# Patient Record
Sex: Male | Born: 1960
Health system: Southern US, Community
[De-identification: ages and names within clinical notes are randomized; demographics above are authoritative.]

## PROBLEM LIST (undated history)

## (undated) DIAGNOSIS — E785 Hyperlipidemia, unspecified: Secondary | ICD-10-CM

## (undated) DIAGNOSIS — F1010 Alcohol abuse, uncomplicated: Secondary | ICD-10-CM

## (undated) DIAGNOSIS — I714 Abdominal aortic aneurysm, without rupture, unspecified: Secondary | ICD-10-CM

## (undated) DIAGNOSIS — K219 Gastro-esophageal reflux disease without esophagitis: Secondary | ICD-10-CM

## (undated) DIAGNOSIS — I5022 Chronic systolic (congestive) heart failure: Secondary | ICD-10-CM

## (undated) DIAGNOSIS — J439 Emphysema, unspecified: Secondary | ICD-10-CM

## (undated) DIAGNOSIS — Z720 Tobacco use: Secondary | ICD-10-CM

## (undated) DIAGNOSIS — K859 Acute pancreatitis without necrosis or infection, unspecified: Secondary | ICD-10-CM

## (undated) DIAGNOSIS — I480 Paroxysmal atrial fibrillation: Secondary | ICD-10-CM

## (undated) DIAGNOSIS — K5790 Diverticulosis of intestine, part unspecified, without perforation or abscess without bleeding: Secondary | ICD-10-CM

## (undated) DIAGNOSIS — I10 Essential (primary) hypertension: Secondary | ICD-10-CM

## (undated) HISTORY — DX: Essential (primary) hypertension: I10

## (undated) HISTORY — DX: Paroxysmal atrial fibrillation: I48.0

## (undated) HISTORY — DX: Gastro-esophageal reflux disease without esophagitis: K21.9

## (undated) HISTORY — DX: Acute pancreatitis without necrosis or infection, unspecified: K85.90

## (undated) HISTORY — DX: Alcohol abuse, uncomplicated: F10.10

## (undated) HISTORY — DX: Hypomagnesemia: E83.42

## (undated) HISTORY — DX: Emphysema, unspecified: J43.9

## (undated) HISTORY — DX: Tobacco use: Z72.0

## (undated) HISTORY — DX: Diverticulosis of intestine, part unspecified, without perforation or abscess without bleeding: K57.90

## (undated) HISTORY — DX: Chronic systolic (congestive) heart failure: I50.22

## (undated) HISTORY — DX: Abdominal aortic aneurysm, without rupture, unspecified: I71.40

## (undated) HISTORY — PX: OTHER SURGICAL HISTORY: SHX169

## (undated) HISTORY — DX: Hyperlipidemia, unspecified: E78.5

## (undated) HISTORY — PX: HERNIA REPAIR: SHX51

---

## 2014-11-11 ENCOUNTER — Inpatient Hospital Stay (HOSPITAL_COMMUNITY): Payer: PRIVATE HEALTH INSURANCE

## 2014-11-11 ENCOUNTER — Encounter (HOSPITAL_COMMUNITY)
Admission: EM | Disposition: A | Discharge status: Skilled Nursing Facility | Payer: Self-pay | Source: Home / Self Care | Attending: Internal Medicine

## 2014-11-11 ENCOUNTER — Emergency Department (HOSPITAL_COMMUNITY): Payer: PRIVATE HEALTH INSURANCE

## 2014-11-11 ENCOUNTER — Inpatient Hospital Stay (HOSPITAL_COMMUNITY): Payer: PRIVATE HEALTH INSURANCE | Admitting: Anesthesiology

## 2014-11-11 ENCOUNTER — Inpatient Hospital Stay (HOSPITAL_COMMUNITY)
Admission: EM | Admit: 2014-11-11 | Discharge: 2014-11-14 | DRG: 481 | Disposition: A | Discharge status: Skilled Nursing Facility | Payer: PRIVATE HEALTH INSURANCE | Attending: Internal Medicine | Admitting: Internal Medicine

## 2014-11-11 ENCOUNTER — Encounter (HOSPITAL_COMMUNITY): Payer: Self-pay | Admitting: *Deleted

## 2014-11-11 DIAGNOSIS — F10931 Alcohol use, unspecified with withdrawal delirium: Secondary | ICD-10-CM | POA: Diagnosis present

## 2014-11-11 DIAGNOSIS — D62 Acute posthemorrhagic anemia: Secondary | ICD-10-CM | POA: Diagnosis not present

## 2014-11-11 DIAGNOSIS — M62838 Other muscle spasm: Secondary | ICD-10-CM | POA: Diagnosis not present

## 2014-11-11 DIAGNOSIS — M25551 Pain in right hip: Secondary | ICD-10-CM | POA: Diagnosis present

## 2014-11-11 DIAGNOSIS — S72141A Displaced intertrochanteric fracture of right femur, initial encounter for closed fracture: Secondary | ICD-10-CM

## 2014-11-11 DIAGNOSIS — D649 Anemia, unspecified: Secondary | ICD-10-CM

## 2014-11-11 DIAGNOSIS — D696 Thrombocytopenia, unspecified: Secondary | ICD-10-CM | POA: Diagnosis present

## 2014-11-11 DIAGNOSIS — W010XXA Fall on same level from slipping, tripping and stumbling without subsequent striking against object, initial encounter: Secondary | ICD-10-CM | POA: Diagnosis present

## 2014-11-11 DIAGNOSIS — S72001A Fracture of unspecified part of neck of right femur, initial encounter for closed fracture: Secondary | ICD-10-CM

## 2014-11-11 DIAGNOSIS — Y9289 Other specified places as the place of occurrence of the external cause: Secondary | ICD-10-CM

## 2014-11-11 DIAGNOSIS — Z79899 Other long term (current) drug therapy: Secondary | ICD-10-CM

## 2014-11-11 DIAGNOSIS — F10231 Alcohol dependence with withdrawal delirium: Secondary | ICD-10-CM | POA: Diagnosis present

## 2014-11-11 DIAGNOSIS — D638 Anemia in other chronic diseases classified elsewhere: Secondary | ICD-10-CM | POA: Diagnosis present

## 2014-11-11 DIAGNOSIS — F17210 Nicotine dependence, cigarettes, uncomplicated: Secondary | ICD-10-CM | POA: Diagnosis present

## 2014-11-11 DIAGNOSIS — W19XXXA Unspecified fall, initial encounter: Secondary | ICD-10-CM

## 2014-11-11 HISTORY — PX: INTRAMEDULLARY (IM) NAIL INTERTROCHANTERIC: SHX5875

## 2014-11-11 HISTORY — DX: Displaced intertrochanteric fracture of right femur, initial encounter for closed fracture: S72.141A

## 2014-11-11 HISTORY — DX: Alcohol use, unspecified with withdrawal delirium: F10.931

## 2014-11-11 LAB — CBC WITH DIFFERENTIAL/PLATELET
Basophils Absolute: 0 10*3/uL (ref 0.0–0.1)
Basophils Relative: 0 % (ref 0–1)
EOS ABS: 0.1 10*3/uL (ref 0.0–0.7)
Eosinophils Relative: 2 % (ref 0–5)
HCT: 36.9 % — ABNORMAL LOW (ref 39.0–52.0)
Hemoglobin: 12.2 g/dL — ABNORMAL LOW (ref 13.0–17.0)
Lymphocytes Relative: 13 % (ref 12–46)
Lymphs Abs: 1.2 10*3/uL (ref 0.7–4.0)
MCH: 31.9 pg (ref 26.0–34.0)
MCHC: 33.1 g/dL (ref 30.0–36.0)
MCV: 96.3 fL (ref 78.0–100.0)
MONOS PCT: 4 % (ref 3–12)
Monocytes Absolute: 0.4 10*3/uL (ref 0.1–1.0)
NEUTROS PCT: 81 % — AB (ref 43–77)
Neutro Abs: 7.5 10*3/uL (ref 1.7–7.7)
PLATELETS: 198 10*3/uL (ref 150–400)
RBC: 3.83 MIL/uL — AB (ref 4.22–5.81)
RDW: 13.2 % (ref 11.5–15.5)
WBC: 9.2 10*3/uL (ref 4.0–10.5)

## 2014-11-11 LAB — PROTIME-INR
INR: 1 (ref 0.00–1.49)
Prothrombin Time: 13.3 seconds (ref 11.6–15.2)

## 2014-11-11 LAB — ABO/RH: ABO/RH(D): O POS

## 2014-11-11 LAB — BASIC METABOLIC PANEL
Anion gap: 5 (ref 5–15)
BUN: 19 mg/dL (ref 6–23)
CHLORIDE: 108 mmol/L (ref 96–112)
CO2: 29 mmol/L (ref 19–32)
Calcium: 8.8 mg/dL (ref 8.4–10.5)
Creatinine, Ser: 0.8 mg/dL (ref 0.50–1.35)
GFR calc non Af Amer: >90 mL/min (ref >90)
Glucose, Bld: 97 mg/dL (ref 70–99)
POTASSIUM: 3.7 mmol/L (ref 3.5–5.1)
SODIUM: 142 mmol/L (ref 135–145)

## 2014-11-11 LAB — TYPE AND SCREEN
ABO/RH(D): O POS
Antibody Screen: NEGATIVE

## 2014-11-11 SURGERY — FIXATION, FRACTURE, INTERTROCHANTERIC, WITH INTRAMEDULLARY ROD
Anesthesia: General | Site: Hip | Laterality: Right

## 2014-11-11 MED ORDER — METOCLOPRAMIDE HCL 10 MG PO TABS
5.0000 mg | ORAL_TABLET | Freq: Three times a day (TID) | ORAL | Status: DC | PRN
Start: 2014-11-11 — End: 2014-11-14

## 2014-11-11 MED ORDER — ACETAMINOPHEN 650 MG RE SUPP: 650.0000 mg | Freq: Four times a day (QID) | RECTAL | Status: DC | PRN

## 2014-11-11 MED ORDER — ONDANSETRON HCL 4 MG/2ML IJ SOLN
4.0000 mg | Freq: Once | INTRAMUSCULAR | Status: AC
  Administered 2014-11-11: 4 mg via INTRAVENOUS
  Filled 2014-11-11: qty 2

## 2014-11-11 MED ORDER — DOCUSATE SODIUM 100 MG PO CAPS
100.0000 mg | ORAL_CAPSULE | Freq: Two times a day (BID) | ORAL | Status: DC
  Administered 2014-11-12 – 2014-11-14 (×5): 100 mg via ORAL

## 2014-11-11 MED ORDER — SUCCINYLCHOLINE CHLORIDE 20 MG/ML IJ SOLN
INTRAMUSCULAR | Status: DC | PRN
  Administered 2014-11-11: 100 mg via INTRAVENOUS

## 2014-11-11 MED ORDER — PROPOFOL 10 MG/ML IV BOLUS
INTRAVENOUS | Status: DC | PRN
  Administered 2014-11-11: 130 mg via INTRAVENOUS

## 2014-11-11 MED ORDER — LIDOCAINE HCL (CARDIAC) 20 MG/ML IV SOLN
INTRAVENOUS | Status: DC | PRN
Start: 2014-11-11 — End: 2014-11-11
  Administered 2014-11-11: 50 mg via INTRAVENOUS

## 2014-11-11 MED ORDER — ONDANSETRON HCL 4 MG/2ML IJ SOLN
INTRAMUSCULAR | Status: AC
  Filled 2014-11-11: qty 2

## 2014-11-11 MED ORDER — CEFAZOLIN SODIUM-DEXTROSE 2-3 GM-% IV SOLR
INTRAVENOUS | Status: DC | PRN
  Administered 2014-11-11: 2 g via INTRAVENOUS

## 2014-11-11 MED ORDER — MAGNESIUM CITRATE PO SOLN: 1.0000 | Freq: Once | ORAL | Status: AC | PRN

## 2014-11-11 MED ORDER — HYDROCODONE-ACETAMINOPHEN 5-325 MG PO TABS
1.0000 | ORAL_TABLET | Freq: Four times a day (QID) | ORAL | Status: DC | PRN
  Administered 2014-11-12: 2 via ORAL
  Filled 2014-11-11: qty 2

## 2014-11-11 MED ORDER — PROPOFOL 10 MG/ML IV BOLUS
INTRAVENOUS | Status: AC
  Filled 2014-11-11: qty 20

## 2014-11-11 MED ORDER — ACETAMINOPHEN 325 MG PO TABS: 650.0000 mg | ORAL_TABLET | Freq: Four times a day (QID) | ORAL | Status: DC | PRN

## 2014-11-11 MED ORDER — FENTANYL CITRATE (PF) 250 MCG/5ML IJ SOLN
INTRAMUSCULAR | Status: AC
  Filled 2014-11-11: qty 5

## 2014-11-11 MED ORDER — DEXAMETHASONE SODIUM PHOSPHATE 10 MG/ML IJ SOLN
INTRAMUSCULAR | Status: AC
  Filled 2014-11-11: qty 1

## 2014-11-11 MED ORDER — SODIUM CHLORIDE 0.9 % IV SOLN
INTRAVENOUS | Status: DC
  Administered 2014-11-12: 01:00:00 via INTRAVENOUS
  Filled 2014-11-11 (×8): qty 1000

## 2014-11-11 MED ORDER — METHOCARBAMOL 1000 MG/10ML IJ SOLN: 500.0000 mg | Freq: Four times a day (QID) | INTRAVENOUS | Status: DC | PRN

## 2014-11-11 MED ORDER — METHOCARBAMOL 500 MG PO TABS: 500.0000 mg | ORAL_TABLET | Freq: Four times a day (QID) | ORAL | Status: DC | PRN

## 2014-11-11 MED ORDER — ALUM & MAG HYDROXIDE-SIMETH 200-200-20 MG/5ML PO SUSP: 30.0000 mL | ORAL | Status: DC | PRN

## 2014-11-11 MED ORDER — ENOXAPARIN SODIUM 40 MG/0.4ML ~~LOC~~ SOLN
40.0000 mg | SUBCUTANEOUS | Status: DC
Start: 2014-11-12 — End: 2014-11-14
  Administered 2014-11-12 – 2014-11-14 (×3): 40 mg via SUBCUTANEOUS
  Filled 2014-11-11 (×3): qty 0.4

## 2014-11-11 MED ORDER — METOCLOPRAMIDE HCL 5 MG/ML IJ SOLN: 5.0000 mg | Freq: Three times a day (TID) | INTRAMUSCULAR | Status: DC | PRN

## 2014-11-11 MED ORDER — METHOCARBAMOL 1000 MG/10ML IJ SOLN
500.0000 mg | Freq: Four times a day (QID) | INTRAVENOUS | Status: DC | PRN
  Administered 2014-11-11: 500 mg via INTRAVENOUS
  Filled 2014-11-11 (×2): qty 5

## 2014-11-11 MED ORDER — CEFAZOLIN SODIUM 1-5 GM-% IV SOLN
1.0000 g | Freq: Four times a day (QID) | INTRAVENOUS | Status: AC
  Administered 2014-11-12 (×2): 1 g via INTRAVENOUS
  Filled 2014-11-11 (×2): qty 50

## 2014-11-11 MED ORDER — POLYETHYLENE GLYCOL 3350 17 G PO PACK: 17.0000 g | PACK | Freq: Every day | ORAL | Status: DC | PRN

## 2014-11-11 MED ORDER — FERROUS SULFATE 325 (65 FE) MG PO TABS
325.0000 mg | ORAL_TABLET | Freq: Three times a day (TID) | ORAL | Status: DC
  Administered 2014-11-12 – 2014-11-14 (×7): 325 mg via ORAL
  Filled 2014-11-11 (×10): qty 1

## 2014-11-11 MED ORDER — FENTANYL CITRATE (PF) 100 MCG/2ML IJ SOLN
INTRAMUSCULAR | Status: DC | PRN
  Administered 2014-11-11: 50 ug via INTRAVENOUS
  Administered 2014-11-11: 100 ug via INTRAVENOUS
  Administered 2014-11-11 (×2): 50 ug via INTRAVENOUS

## 2014-11-11 MED ORDER — MORPHINE SULFATE 2 MG/ML IJ SOLN
0.5000 mg | INTRAMUSCULAR | Status: DC | PRN
  Administered 2014-11-12: 0.5 mg via INTRAVENOUS
  Filled 2014-11-11: qty 1

## 2014-11-11 MED ORDER — HYDROMORPHONE HCL 1 MG/ML IJ SOLN
0.2500 mg | INTRAMUSCULAR | Status: DC | PRN
  Administered 2014-11-11 (×3): 0.5 mg via INTRAVENOUS

## 2014-11-11 MED ORDER — HYDROMORPHONE HCL 1 MG/ML IJ SOLN
INTRAMUSCULAR | Status: AC
Start: 2014-11-11 — End: 2014-11-11
  Administered 2014-11-11: 23:00:00
  Filled 2014-11-11: qty 2

## 2014-11-11 MED ORDER — MENTHOL 3 MG MT LOZG: 1.0000 | LOZENGE | OROMUCOSAL | Status: DC | PRN

## 2014-11-11 MED ORDER — PHENOL 1.4 % MT LIQD
1.0000 | OROMUCOSAL | Status: DC | PRN
  Filled 2014-11-11: qty 177

## 2014-11-11 MED ORDER — HYDROMORPHONE HCL 1 MG/ML IJ SOLN
0.5000 mg | INTRAMUSCULAR | Status: AC | PRN
  Administered 2014-11-11 (×2): 0.5 mg via INTRAVENOUS
  Filled 2014-11-11 (×2): qty 1

## 2014-11-11 MED ORDER — LACTATED RINGERS IV SOLN
INTRAVENOUS | Status: DC | PRN
  Administered 2014-11-11: 21:00:00 via INTRAVENOUS

## 2014-11-11 MED ORDER — MIDAZOLAM HCL 5 MG/5ML IJ SOLN
INTRAMUSCULAR | Status: DC | PRN
  Administered 2014-11-11: 2 mg via INTRAVENOUS

## 2014-11-11 MED ORDER — ONDANSETRON HCL 4 MG PO TABS: 4.0000 mg | ORAL_TABLET | Freq: Four times a day (QID) | ORAL | Status: DC | PRN

## 2014-11-11 MED ORDER — EPHEDRINE SULFATE 50 MG/ML IJ SOLN
INTRAMUSCULAR | Status: DC | PRN
  Administered 2014-11-11: 10 mg via INTRAVENOUS

## 2014-11-11 MED ORDER — CEFAZOLIN SODIUM-DEXTROSE 2-3 GM-% IV SOLR
INTRAVENOUS | Status: AC
  Filled 2014-11-11: qty 50

## 2014-11-11 MED ORDER — POLYETHYLENE GLYCOL 3350 17 G PO PACK
17.0000 g | PACK | Freq: Two times a day (BID) | ORAL | Status: DC
  Administered 2014-11-12 – 2014-11-14 (×5): 17 g via ORAL

## 2014-11-11 MED ORDER — ONDANSETRON HCL 4 MG/2ML IJ SOLN
INTRAMUSCULAR | Status: DC | PRN
  Administered 2014-11-11: 4 mg via INTRAVENOUS

## 2014-11-11 MED ORDER — DEXAMETHASONE SODIUM PHOSPHATE 10 MG/ML IJ SOLN
INTRAMUSCULAR | Status: DC | PRN
  Administered 2014-11-11: 10 mg via INTRAVENOUS

## 2014-11-11 MED ORDER — LIDOCAINE HCL (CARDIAC) 20 MG/ML IV SOLN
INTRAVENOUS | Status: AC
  Filled 2014-11-11: qty 5

## 2014-11-11 MED ORDER — LACTATED RINGERS IV SOLN: INTRAVENOUS | Status: DC

## 2014-11-11 MED ORDER — HYDROMORPHONE HCL 1 MG/ML IJ SOLN
0.5000 mg | INTRAMUSCULAR | Status: DC | PRN
  Administered 2014-11-12: 1 mg via INTRAVENOUS
  Filled 2014-11-11: qty 1

## 2014-11-11 MED ORDER — HYDROCODONE-ACETAMINOPHEN 5-325 MG PO TABS: 1.0000 | ORAL_TABLET | Freq: Four times a day (QID) | ORAL | Status: DC | PRN

## 2014-11-11 MED ORDER — ONDANSETRON HCL 4 MG/2ML IJ SOLN: 4.0000 mg | Freq: Four times a day (QID) | INTRAMUSCULAR | Status: DC | PRN

## 2014-11-11 MED ORDER — METHOCARBAMOL 500 MG PO TABS
500.0000 mg | ORAL_TABLET | Freq: Four times a day (QID) | ORAL | Status: DC | PRN
  Administered 2014-11-12 – 2014-11-14 (×8): 500 mg via ORAL
  Filled 2014-11-11 (×8): qty 1

## 2014-11-11 MED ORDER — SODIUM CHLORIDE 0.9 % IV SOLN
INTRAVENOUS | Status: DC
  Administered 2014-11-11: via INTRAVENOUS

## 2014-11-11 MED ORDER — MIDAZOLAM HCL 2 MG/2ML IJ SOLN
INTRAMUSCULAR | Status: AC
  Filled 2014-11-11: qty 2

## 2014-11-11 SURGICAL SUPPLY — 42 items
BAG ZIPLOCK 12X15 (MISCELLANEOUS) IMPLANT
BIT DRILL 4.3MMS DISTAL GRDTED (BIT) ×1 IMPLANT
BNDG GAUZE ELAST 4 BULKY (GAUZE/BANDAGES/DRESSINGS) ×3 IMPLANT
CORTICAL BONE SCR 5.0MM X 46MM (Screw) ×3 IMPLANT
COVER PERINEAL POST (MISCELLANEOUS) ×3 IMPLANT
DRAPE INCISE IOBAN 66X45 STRL (DRAPES) ×3 IMPLANT
DRAPE STERI IOBAN 125X83 (DRAPES) ×3 IMPLANT
DRILL 4.3MMS DISTAL GRADUATED (BIT) ×3
DRSG AQUACEL AG ADV 3.5X 4 (GAUZE/BANDAGES/DRESSINGS) IMPLANT
DRSG AQUACEL AG ADV 3.5X 6 (GAUZE/BANDAGES/DRESSINGS) IMPLANT
DRSG MEPILEX BORDER 4X12 (GAUZE/BANDAGES/DRESSINGS) ×3 IMPLANT
DRSG MEPILEX BORDER 4X4 (GAUZE/BANDAGES/DRESSINGS) ×3 IMPLANT
DURAPREP 26ML APPLICATOR (WOUND CARE) ×3 IMPLANT
ELECT REM PT RETURN 9FT ADLT (ELECTROSURGICAL) ×3
ELECTRODE REM PT RTRN 9FT ADLT (ELECTROSURGICAL) ×1 IMPLANT
GAUZE SPONGE 4X4 12PLY STRL (GAUZE/BANDAGES/DRESSINGS) ×3 IMPLANT
GLOVE BIOGEL PI IND STRL 7.0 (GLOVE) ×1 IMPLANT
GLOVE BIOGEL PI IND STRL 7.5 (GLOVE) ×1 IMPLANT
GLOVE BIOGEL PI IND STRL 8 (GLOVE) ×1 IMPLANT
GLOVE BIOGEL PI INDICATOR 7.0 (GLOVE) ×2
GLOVE BIOGEL PI INDICATOR 7.5 (GLOVE) ×2
GLOVE BIOGEL PI INDICATOR 8 (GLOVE) ×2
GLOVE ORTHO TXT STRL SZ7.5 (GLOVE) ×3 IMPLANT
GLOVE SURG SS PI 7.0 STRL IVOR (GLOVE) ×3 IMPLANT
GOWN SPEC L3 XXLG W/TWL (GOWN DISPOSABLE) ×3 IMPLANT
GOWN STRL REUS W/TWL LRG LVL3 (GOWN DISPOSABLE) ×3 IMPLANT
GUIDEPIN 3.2X17.5 THRD DISP (PIN) ×3 IMPLANT
GUIDEWIRE BALL NOSE 100CM (WIRE) ×3 IMPLANT
HFN RH 130 DEG 11MM X 420MM (Nail) ×3 IMPLANT
KIT BASIN OR (CUSTOM PROCEDURE TRAY) ×3 IMPLANT
MANIFOLD NEPTUNE II (INSTRUMENTS) ×3 IMPLANT
PACK GENERAL/GYN (CUSTOM PROCEDURE TRAY) ×3 IMPLANT
POSITIONER SURGICAL ARM (MISCELLANEOUS) ×3 IMPLANT
SCREW BONE CORTICAL 5.0X50 (Screw) ×3 IMPLANT
SCREW CORTICL BON 5.0MM X 46MM (Screw) ×1 IMPLANT
SCREW LAG 10.5X120MM (Screw) ×3 IMPLANT
STAPLER VISISTAT 35W (STAPLE) ×3 IMPLANT
SUT MNCRL AB 4-0 PS2 18 (SUTURE) ×3 IMPLANT
SUT VIC AB 1 CT1 27 (SUTURE) ×2
SUT VIC AB 1 CT1 27XBRD ANTBC (SUTURE) ×1 IMPLANT
SUT VIC AB 2-0 CT1 27 (SUTURE) ×2
SUT VIC AB 2-0 CT1 27XBRD (SUTURE) ×1 IMPLANT

## 2014-11-11 NOTE — H&P (Signed)
[facility] History and Physical  Visente Bonde XID:[id number] DOB: 11-11-1960 DOA: 11/11/2014  Referring physician: Marlon Pel, PA PCP: Pcp Not In System   Chief Complaint: Fall   HPI: Luis Marquez is a 54 y.o. male presents after a fall with right hip pain. 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 He states that he has to step in a stepping pan to clean his feet and he states his feet got caught up in it. He states he tripped and fell. Patient states that he tried to get up and could not bear weight. He states that he fell again and heard a popping sound. He states that he was a former drinker but has not had a drink in 2 months. Patient denies syncope no chest pain. He states no SOB noted. Has not had fevers or chills. He states now his pain is a little better. He does not live here in [place] and is from [place]   Review of Systems:  Complete 12 point ROS done and is unremarkable other noted in HPI  History reviewed. No pertinent past medical history. Past Surgical History  Procedure Laterality Date  . Hernia repair     Social History:  reports that he has been smoking Cigarettes.  He has been smoking about 0.25 packs per day. He does not have any smokeless tobacco history on file. He reports that he does not drink alcohol or use illicit drugs.  No Known Allergies  No family history on file.   Prior to Admission medications   Medication Sig Start Date End Date Taking? Authorizing Provider  GLUCOS-CHONDROIT-MSM-C-HYAL PO Take 1 tablet by mouth daily.   Yes Historical Provider, MD  IRON PO Take 1 tablet by mouth daily.   Yes Historical Provider, MD  Multiple Vitamin (MULTIVITAMIN WITH MINERALS) TABS tablet Take 1 tablet by mouth daily.   Yes Historical Provider, MD   Physical Exam: Filed Vitals:   11/11/14 1637  BP: 138/91  Pulse: 64  Temp: 97.6 F (36.4 C)  TempSrc: Oral  Resp: 18  SpO2: 93%    Wt Readings from Last 3 Encounters:  No data found for Wt      General:  Appears calm and comfortable Eyes: PERRL, normal lids, irises & conjunctiva ENT: grossly normal hearing, lips & tongue Neck: no LAD, masses or thyromegaly Cardiovascular: RRR, no m/r/g. No LE edema. Respiratory: CTA bilaterally, no w/r/r. Normal respiratory effort. Abdomen: soft, ntnd Skin: no rash or induration seen on limited exam Musculoskeletal: grossly normal tone BUE/BLE c/o right hip pain Psychiatric: grossly normal mood and affect, speech fluent and appropriate Neurologic: grossly non-focal.          Labs on Admission:  Basic Metabolic Panel:  Recent Labs Lab 11/11/14 1717  NA 142  K 3.7  CL 108  CO2 29  GLUCOSE 97  BUN 19  CREATININE 0.80  CALCIUM 8.8   Liver Function Tests: No results for input(s): AST, ALT, ALKPHOS, BILITOT, PROT, ALBUMIN in the last 168 hours. No results for input(s): LIPASE, AMYLASE in the last 168 hours. No results for input(s): AMMONIA in the last 168 hours. CBC:  Recent Labs Lab 11/11/14 1717  WBC 9.2  NEUTROABS 7.5  HGB 12.2*  HCT 36.9*  MCV 96.3  PLT 198   Cardiac Enzymes: No results for input(s): CKTOTAL, CKMB, CKMBINDEX, TROPONINI in the last 168 hours.  BNP (last 3 results) No results for input(s): BNP in the last 8760 hours.  ProBNP (last 3 results)  No results for input(s): PROBNP in the last 8760 hours.  CBG: No results for input(s): GLUCAP in the last 168 hours.  Radiological Exams on Admission: Dg Chest 1 View  11/11/2014   CLINICAL DATA:  Preop right hip fracture.  EXAM: CHEST  1 VIEW  COMPARISON:  None.  FINDINGS: The heart size and mediastinal contours are within normal limits. Both lungs are clear. The visualized skeletal structures are unremarkable.  IMPRESSION: No active disease.   Electronically Signed   By: Elige Ko   On: 11/11/2014 18:07   Dg Hip Unilat With Pelvis 2-3 Views Right  11/11/2014   CLINICAL DATA:  Tripped on concrete.  Right hip pain.  EXAM: RIGHT HIP (WITH PELVIS) 2-3  VIEWS  COMPARISON:  None.  FINDINGS: Comminuted and angulated right intertrochanteric fracture. No hip dislocation.  Left hip is unremarkable.  Prior left inguinal hernia repair.  IMPRESSION: Comminuted and angulated right intertrochanteric fracture.   Electronically Signed   By: Elige Ko   On: 11/11/2014 17:55     Assessment/Plan Active Problems:   Delirium tremens   Intertrochanteric fracture of right femur   Fracture, intertrochanteric, right femur   Anemia   1. Intertrochanteric Fracture of the Right Femur -will admit for orthopedics -orthopedics consult ordered -will keep NPO -Pain control -non-weight bearing  2. Normocytic Anemia -will check iron studies -type and cross in ED  3. Remote history of ETOH abuse -he has not had a drink in over 2 months   Code Status: Full Code (must indicate code status--if unknown or must be presumed, indicate so) DVT Prophylaxis:SCD Family Communication: None (indicate person spoken with, if applicable, with phone number if by telephone) Disposition Plan: Possible SNF (indicate anticipated LOS)  Time spent:  [facility] A [facility] Pager [phone]

## 2014-11-11 NOTE — Anesthesia Procedure Notes (Signed)
Procedure Name: Intubation Date/Time: 11/11/2014 9:26 PM Performed by: Early Osmond E Pre-anesthesia Checklist: Patient identified, Emergency Drugs available, Suction available and Patient being monitored Patient Re-evaluated:Patient Re-evaluated prior to inductionOxygen Delivery Method: Circle System Utilized Preoxygenation: Pre-oxygenation with 100% oxygen Intubation Type: IV induction, Rapid sequence and Cricoid Pressure applied Laryngoscope Size: Mac and 3 Grade View: Grade II Tube type: Oral Tube size: 7.5 mm Number of attempts: 1 Airway Equipment and Method: Stylet Placement Confirmation: ETT inserted through vocal cords under direct vision,  positive ETCO2 and breath sounds checked- equal and bilateral Secured at: 21 cm Tube secured with: Tape Dental Injury: Teeth and Oropharynx as per pre-operative assessment

## 2014-11-11 NOTE — Brief Op Note (Signed)
11/11/2014  8:59 PM  PATIENT:  Luis Marquez  54 y.o. male  PRE-OPERATIVE DIAGNOSIS:  Right hip intertrochanteric proximal femur fracture  POST-OPERATIVE DIAGNOSIS:  Right hip intertrochanteric proximal femur fracture  PROCEDURE:  Procedure(s): INTRAMEDULLARY (IM) NAIL INTERTROCHANTRIC (Right), ORIF of right hip fracture  SURGEON:  Surgeon(s) and Role:    * Durene Romans, MD - Primary  PHYSICIAN ASSISTANT: None  ANESTHESIA:   general  EBL:   <100cc  BLOOD ADMINISTERED:none  DRAINS: none   LOCAL MEDICATIONS USED:  NONE  SPECIMEN:  No Specimen  DISPOSITION OF SPECIMEN:  N/A  COUNTS:  YES  TOURNIQUET:  * No tourniquets in log *  DICTATION: .Other Dictation: Dictation Number [id number]  PLAN OF CARE: Admit to inpatient   PATIENT DISPOSITION:  PACU - hemodynamically stable.   Delay start of Pharmacological VTE agent (>24hrs) due to surgical blood loss or risk of bleeding: no

## 2014-11-11 NOTE — Progress Notes (Signed)
EDCM spoke to patient at bedside.  Patient reports he is from [place].  Patient reports his pcp is Dr. Pollie Friar in [place].  Patient reports he is currently staying at [facility] house for recovery.  Patient reports he has been staying at [facility] for two months.  While working at [employer] patient fell with injury to right leg.  Xray right hip revealed comminuted and angulated right intertrochanteric fracture.  Patient reports no family here in [place].  Patient is without insurance.  Patient reports before fall, independent with ADL's.  [facility] consulted EDSW.  No further EDCM needs at this time.

## 2014-11-11 NOTE — Transfer of Care (Signed)
Immediate Anesthesia Transfer of Care Note  Patient: Luis Marquez  Procedure(s) Performed: Procedure(s): INTRAMEDULLARY (IM) NAIL INTERTROCHANTRIC (Right)  Patient Location: PACU  Anesthesia Type:General  Level of Consciousness:  sedated, patient cooperative and responds to stimulation  Airway & Oxygen Therapy:Patient Spontanous Breathing and Patient connected to face mask oxgen  Post-op Assessment:  Report given to PACU RN and Post -op Vital signs reviewed and stable  Post vital signs:  Reviewed and stable  Last Vitals:  Filed Vitals:   11/11/14 1924  BP: 145/92  Pulse: 70  Temp:   Resp: 18    Complications: No apparent anesthesia complications

## 2014-11-11 NOTE — Consult Note (Signed)
Reason for Consult: Right hip fracture Referring Physician:  Welton Flakes, MD (Hospitalist service)  Luis Marquez is an 54 y.o. male.  HPI: Luis Marquez is a 54 y.o. male presents after a fall with right hip pain. 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 He states that he has to step in a stepping pan to clean his feet and he states his feet got caught up in it. He states he tripped and fell. Patient states that he tried to get up and could not bear weight. He states that he fell again and heard a popping sound. He states that he was a former drinker but has not had a drink in 2 months. Patient denies syncope no chest pain. He states no SOB noted. Has not had fevers or chills. He states now his pain is a little better. He does not live here in [place] and is from [place]  He reports pain only in his hip and right thigh area.  No left lower extremity complaints, no ipsilateral knee or ankle injury to report.  No UE injuries.  History reviewed. No pertinent past medical history.  Past Surgical History  Procedure Laterality Date  . Hernia repair      No family history on file.  Social History:  reports that he has been smoking Cigarettes.  He has been smoking about 0.25 packs per day. He does not have any smokeless tobacco history on file. He reports that he does not drink alcohol or use illicit drugs.  Allergies: No Known Allergies  Medications: I have reviewed the patient's current medications. Continuous:  Results for orders placed or performed during the hospital encounter of 11/11/14 (from the past 24 hour(s))  Basic metabolic panel     Status: None   Collection Time: 11/11/14  5:17 PM  Result Value Ref Range   Sodium 142 135 - 145 mmol/L   Potassium 3.7 3.5 - 5.1 mmol/L   Chloride 108 96 - 112 mmol/L   CO2 29 19 - 32 mmol/L   Glucose, Bld 97 70 - 99 mg/dL   BUN 19 6 - 23 mg/dL   Creatinine, Ser 1.91 0.50 - 1.35 mg/dL   Calcium 8.8 8.4 - 47.8 mg/dL   GFR calc non Af Amer >90  >90 mL/min   GFR calc Af Amer >90 >90 mL/min   Anion gap 5 5 - 15  CBC WITH DIFFERENTIAL     Status: Abnormal   Collection Time: 11/11/14  5:17 PM  Result Value Ref Range   WBC 9.2 4.0 - 10.5 K/uL   RBC 3.83 (L) 4.22 - 5.81 MIL/uL   Hemoglobin 12.2 (L) 13.0 - 17.0 g/dL   HCT 29.5 (L) 62.1 - 30.8 %   MCV 96.3 78.0 - 100.0 fL   MCH 31.9 26.0 - 34.0 pg   MCHC 33.1 30.0 - 36.0 g/dL   RDW 65.7 84.6 - 96.2 %   Platelets 198 150 - 400 K/uL   Neutrophils Relative % 81 (H) 43 - 77 %   Neutro Abs 7.5 1.7 - 7.7 K/uL   Lymphocytes Relative 13 12 - 46 %   Lymphs Abs 1.2 0.7 - 4.0 K/uL   Monocytes Relative 4 3 - 12 %   Monocytes Absolute 0.4 0.1 - 1.0 K/uL   Eosinophils Relative 2 0 - 5 %   Eosinophils Absolute 0.1 0.0 - 0.7 K/uL   Basophils Relative 0 0 - 1 %   Basophils Absolute 0.0 0.0 - 0.1 K/uL  Protime-INR     Status: None   Collection Time: 11/11/14  5:17 PM  Result Value Ref Range   Prothrombin Time 13.3 11.6 - 15.2 seconds   INR 1.00 0.00 - 1.49  Type and screen     Status: None   Collection Time: 11/11/14  5:17 PM  Result Value Ref Range   ABO/RH(D) O POS    Antibody Screen NEG    Sample Expiration 11/14/2014     X-ray: CLINICAL DATA: Tripped on concrete. Right hip pain.  EXAM: RIGHT HIP (WITH PELVIS) 2-3 VIEWS  COMPARISON: None.  FINDINGS: Comminuted and angulated right intertrochanteric fracture. No hip dislocation.  Left hip is unremarkable.  Prior left inguinal hernia repair.  IMPRESSION: Comminuted and angulated right intertrochanteric fracture  ROS  Otherwise healthy [place]ia repair 2 years ago [place], no recent problems  Complete 12 point ROS done and is unremarkable other noted in HPI  Blood pressure 145/92, pulse 70, temperature 97.6 F (36.4 C), temperature source Oral, resp. rate 18, SpO2 96 %.  Physical Exam  Awake alert, relatively comfortable Head and neck normal no lacerations, abrasions or contusions Upper extremities  normal Chest clear Heart regular Abdomen soft  RIGHT lower extremity shortened and externally rotated, NVI Left lower extremity normal alignment no injuries  Assessment/Plan: Right comminuted displaced intertrochanteric proximal femur fracture  NPO since 2pm To OR tonight for ORIF right hip Reviewed indications, necessity of procedure as well as complications of infection, non union failure of implant and need for future surgery in addition to probability of persistent discomfort around hip even after fracture union Consent signed  Admitted to Hospitalist service per protocol Discharge pending medical stability and therapy evaluation and recommednations  [place] Oborn D 11/11/2014, 8:49 PM

## 2014-11-11 NOTE — Clinical Social Work Note (Signed)
Clinical Social Work Assessment  Patient Details  Name: Lewin Pellow MRN: [id number] Date of Birth: 1960-11-05  Date of referral:  11/11/14               Reason for consult:   (Fall.)                Permission sought to share information with:   (No.) Permission granted to share information::  No  Name::        Agency::     Relationship::     Contact Information:     Housing/Transportation Living arrangements for the past 2 months:  [place] ([place] Source of Information:  Patient Patient Interpreter Needed:  None Criminal Activity/Legal Involvement Pertinent to Current Situation/Hospitalization:  No - Comment as needed 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  Yes Need for family participation in patient care:  Yes (Comment)  Care giving concerns:  The pt states that he currently has a great support at his current residence [facility]. He states that they are very supportive.   Social Worker assessment / plan:  CSW met with pt at bedside. There was no family present.  Patient presents to [facility] due to hip fracture. 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 Patient stated " I just happened to trip. Its just a freak accident. " He states that he does not usually fall.   Patient informed CSW that he is able to complete his ADL's independently. Patient informed CSW that he would not mind going to a SNF if he needs rehab.   Pt states he has no further questions.    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  Patient/Family's Understanding of and Emotional Response to Diagnosis, Current Treatment, and Prognosis:  Patient is aware that he presents to [facility] due to fall. He informed CSW that he would not mind going into a SNF if he needed to for rehab.  Emotional Assessment Appearance:  Appears stated age Attitude/Demeanor/Rapport:   (Happy. Well Mannered.) Affect (typically observed):  Appropriate Orientation:  Oriented to Self, Oriented to Place, Oriented to  Time, Oriented to Situation Alcohol / Substance use:   (Past history of alchol and subtance abuse.) Psych involvement (Current and /or in the community):  No (Comment)  Discharge Needs  Concerns to be addressed:  No discharge needs identified Readmission within the last 30 days:  No Current discharge risk:  None Barriers to Discharge:  No Barriers Identified   Bernita Buffy, LCSW 11/11/2014, 10:47 PM

## 2014-11-11 NOTE — ED Notes (Addendum)
Pt returned from X-ray. Jeans cut off of patient.

## 2014-11-11 NOTE — ED Notes (Signed)
Per EMS, [facility] animal [facility].  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  Tripped and fell landing on his R hip.  Shortening and external rotation noted.

## 2014-11-11 NOTE — Anesthesia Preprocedure Evaluation (Signed)
Anesthesia Evaluation  Patient identified by MRN, date of birth, ID band Patient awake    Reviewed: Allergy & Precautions, H&P , NPO status , Patient's Chart, lab work & pertinent test results  Airway Mallampati: II  TM Distance: >3 FB Neck ROM: Full    Dental no notable dental hx. (+) Edentulous Upper, Edentulous Lower, Dental Advisory Given   Pulmonary Current Smoker,  breath sounds clear to auscultation  Pulmonary exam normal       Cardiovascular negative cardio ROS  Rhythm:Regular Rate:Normal     Neuro/Psych negative neurological ROS  negative psych ROS   GI/Hepatic negative GI ROS, Neg liver ROS,   Endo/Other  negative endocrine ROS  Renal/GU negative Renal ROS  negative genitourinary   Musculoskeletal   Abdominal   Peds  Hematology negative hematology ROS (+)   Anesthesia Other Findings   Reproductive/Obstetrics negative OB ROS                             Anesthesia Physical Anesthesia Plan  ASA: II  Anesthesia Plan: General   Post-op Pain Management:    Induction: Intravenous, Rapid sequence and Cricoid pressure planned  Airway Management Planned: Oral ETT  Additional Equipment:   Intra-op Plan:   Post-operative Plan: Extubation in OR  Informed Consent: I have reviewed the patients History and Physical, chart, labs and discussed the procedure including the risks, benefits and alternatives for the proposed anesthesia with the patient or authorized representative who has indicated his/her understanding and acceptance.   Dental advisory given  Plan Discussed with: CRNA  Anesthesia Plan Comments:         Anesthesia Quick Evaluation

## 2014-11-11 NOTE — ED Provider Notes (Signed)
CSN: [id number]     Arrival date & time 11/11/14  1629 History   First MD Initiated Contact with Patient 11/11/14 1652     Chief Complaint  Patient presents with  . Fall  . Hip Pain     (Consider location/radiation/quality/duration/timing/severity/associated sxs/prior Treatment) HPI    PCP: Pcp Not In System Blood pressure 138/91, pulse 64, temperature 97.6 F (36.4 C), temperature source Oral, resp. rate 18, SpO2 93 %.  Luis Marquez is a 54 y.o.male with a significant PMH of hernia repiar presents to the ER by EMS with complaints of right hip injury. Patient accidentally slipped in the animal shelter, heard a pop and had immediate pain in his. He has obvious shortening and rotation to his hip. He has no pain as long as he lays still. Significant pain with movement.  Negative Review of Symptoms:  Denies head injury, neck injury, loc or any other injuries.   History reviewed. No pertinent past medical history. Past Surgical History  Procedure Laterality Date  . Hernia repair     No family history on file. History  Substance Use Topics  . Smoking status: Current Every Day Smoker -- 0.25 packs/day    Types: Cigarettes  . Smokeless tobacco: Not on file  . Alcohol Use: No    Review of Systems ROS: No TIA's or unusual headaches, no dysphagia.  No prolonged cough. No dyspnea or chest pain on exertion.  No abdominal pain, change in bowel habits, black or bloody stools.  No urinary tract symptoms.    Allergies  Review of patient's allergies indicates no known allergies.  Home Medications   Prior to Admission medications   Medication Sig Start Date End Date Taking? Authorizing Provider  GLUCOS-CHONDROIT-MSM-C-HYAL PO Take 1 tablet by mouth daily.   Yes Historical Provider, MD  IRON PO Take 1 tablet by mouth daily.   Yes Historical Provider, MD  Multiple Vitamin (MULTIVITAMIN WITH MINERALS) TABS tablet Take 1 tablet by mouth daily.   Yes Historical Provider, MD   BP 138/91  mmHg  Pulse 64  Temp(Src) 97.6 F (36.4 C) (Oral)  Resp 18  SpO2 93% Physical Exam  Constitutional: He is oriented to person, place, and time. He appears well-developed and well-nourished. No distress.  HENT:  Head: Normocephalic and atraumatic.  Eyes: Pupils are equal, round, and reactive to light.  Neck: Normal range of motion. Neck supple. No spinous process tenderness and no muscular tenderness present.  Cardiovascular: Normal rate and regular rhythm.   Pulmonary/Chest: Effort normal.  Abdominal: Soft.  Musculoskeletal:       Right hip: He exhibits decreased range of motion, decreased strength, tenderness, bony tenderness, swelling and deformity (shortening with external rotation. Femoral and pedal pulses are intact.). He exhibits no laceration.  Neurological: He is alert and oriented to person, place, and time.  Skin: Skin is warm and dry.  Nursing note and vitals reviewed.   ED Course  Procedures (including critical care time) Labs Review Labs Reviewed  CBC WITH DIFFERENTIAL/PLATELET - Abnormal; Notable for the following:    RBC 3.83 (*)    Hemoglobin 12.2 (*)    HCT 36.9 (*)    Neutrophils Relative % 81 (*)    All other components within normal limits  BASIC METABOLIC PANEL  PROTIME-INR  TYPE AND SCREEN    Imaging Review Dg Chest 1 View  11/11/2014   CLINICAL DATA:  Preop right hip fracture.  EXAM: CHEST  1 VIEW  COMPARISON:  None.  FINDINGS: The  heart size and mediastinal contours are within normal limits. Both lungs are clear. The visualized skeletal structures are unremarkable.  IMPRESSION: No active disease.   Electronically Signed   By: Elige Ko   On: 11/11/2014 18:07   Dg Hip Unilat With Pelvis 2-3 Views Right  11/11/2014   CLINICAL DATA:  Tripped on concrete.  Right hip pain.  EXAM: RIGHT HIP (WITH PELVIS) 2-3 VIEWS  COMPARISON:  None.  FINDINGS: Comminuted and angulated right intertrochanteric fracture. No hip dislocation.  Left hip is unremarkable.  Prior  left inguinal hernia repair.  IMPRESSION: Comminuted and angulated right intertrochanteric fracture.   Electronically Signed   By: Elige Ko   On: 11/11/2014 17:55     EKG Interpretation None      MDM   Final diagnoses:  Intertrochanteric fracture of right femur, closed, initial encounter    Dr. Welton Flakes, S. Will admit patient to medsurg. Dr. Charlann Boxer is the Orthopedic surgeon on call. He requests keeping the patient NPO. If the patient hasn't eaten within 4 hours he will try to do the surgery tonight. He is admitted in good condition with good pain control.  Patient has been updated on his status for admission and need for surgery due to fracture. He voices his understanding and consent.   Filed Vitals:   11/11/14 1637  BP: 138/91  Pulse: 64  Temp: 97.6 F (36.4 C)  Resp: [address] PA-C 11/11/14 1918  Mancel Bale, MD 11/12/14 [phone]

## 2014-11-11 NOTE — ED Notes (Signed)
Patient transported to X-ray 

## 2014-11-12 ENCOUNTER — Encounter (HOSPITAL_COMMUNITY): Payer: Self-pay | Admitting: Orthopedic Surgery

## 2014-11-12 MED ORDER — FERROUS SULFATE 325 (65 FE) MG PO TABS: 325.0000 mg | ORAL_TABLET | Freq: Three times a day (TID) | ORAL | Status: DC

## 2014-11-12 MED ORDER — DOCUSATE SODIUM 100 MG PO CAPS: 100.0000 mg | ORAL_CAPSULE | Freq: Two times a day (BID) | ORAL | Status: DC

## 2014-11-12 MED ORDER — POLYETHYLENE GLYCOL 3350 17 G PO PACK: 17.0000 g | PACK | Freq: Two times a day (BID) | ORAL | Status: DC

## 2014-11-12 MED ORDER — ASPIRIN EC 325 MG PO TBEC: 325.0000 mg | DELAYED_RELEASE_TABLET | Freq: Two times a day (BID) | ORAL | Status: AC

## 2014-11-12 MED ORDER — HYDROCODONE-ACETAMINOPHEN 5-325 MG PO TABS: 1.0000 | ORAL_TABLET | ORAL | Status: DC | PRN

## 2014-11-12 MED ORDER — METHOCARBAMOL 500 MG PO TABS: 500.0000 mg | ORAL_TABLET | Freq: Four times a day (QID) | ORAL | Status: DC | PRN

## 2014-11-12 MED ORDER — HYDROCODONE-ACETAMINOPHEN 5-325 MG PO TABS
1.0000 | ORAL_TABLET | ORAL | Status: DC | PRN
  Administered 2014-11-12 – 2014-11-14 (×13): 2 via ORAL
  Filled 2014-11-12 (×13): qty 2

## 2014-11-12 NOTE — Progress Notes (Signed)
Nutrition Brief Note  Consult recieved per hip fracture protocol.   Wt Readings from Last 15 Encounters:  11/12/14 175 lb (79.379 kg)    Body mass index is 23.09 kg/(m^2). Patient meets criteria for normal body weight based on current BMI.   Current diet order is regular, patient is consuming approximately 100% of meals at this time. Labs and medications reviewed.   Pt denied the need for nutritional supplements. He reports no recent weight loss or changes in appetite.   No nutrition interventions warranted at this time. If nutrition issues arise, please consult RD.   Emmaline Kluver MS, RD, LDN [phone]

## 2014-11-12 NOTE — Progress Notes (Signed)
     Subjective: 1 Day Post-Op Procedure(s) (LRB): INTRAMEDULLARY (IM) NAIL INTERTROCHANTRIC (Right)   Patient reports pain as mild, pain only with movement of the right leg. No events throughout the night.   Objective:   VITALS:   Filed Vitals:   11/12/14 0519  BP: 111/75  Pulse: 70  Temp: 98.2 F (36.8 C)  Resp: 16    Dorsiflexion/Plantar flexion intact Incision: dressing C/D/I No cellulitis present Compartment soft  LABS  Recent Labs  11/11/14 1717  HGB 12.2*  HCT 36.9*  WBC 9.2  PLT 198     Recent Labs  11/11/14 1717  NA 142  K 3.7  BUN 19  CREATININE 0.80  GLUCOSE 97     Assessment/Plan: 1 Day Post-Op Procedure(s) (LRB): INTRAMEDULLARY (IM) NAIL INTERTROCHANTRIC (Right)  Foley cath d/c after first PT Advance diet Up with therapy   Ortho recommendations:  ASA 325 mg bid for 4 weeks for anticoagulation, unless other medically indicated.   Norco for pain management (Rx written).  Robaxin for muscle spasms (Rx written).  MiraLax and Colace for constipation  Iron 325 mg tid for 2 weeks   WB 50% on the right leg.  Dressing to remain in place for 2-3 days, then changed daily with gauze and tape.  Keep the area dry and clean.  Follow up in 2 weeks at [facility]. Follow up with OLIN,Sherry Rogus D in 2 weeks.  Contact information:  [facility] [address] [place] [address] [phone]         Anastasio Auerbach. Analiza Cowger   PAC  11/12/2014, 8:22 AM

## 2014-11-12 NOTE — Progress Notes (Signed)
Patient ID: Luis Marquez, male   DOB: 1961/01/13, 54 y.o.   MRN: [id number]  [facility] PROGRESS NOTE  Oluwatimileyin Hemp BTY:[id number] DOB: 1961/07/07 DOA: 11/11/2014   PCP: Patient has no PCP  Brief narrative:    54 y.o. male presents after a fall with right hip pain and suffered subsequent right hip fracture.  Assessment/Plan:    Active Problems: Mechanical fall with subsequent right intratrochanteric fracture - Patient is now status post ORIF, postop day 1 - Appreciate orthopedic team following - Recommendation is to proceed with physical therapy, up as tolerated - DVT prophylaxis for 4 weeks aspirin 325 mg twice a day - Pain management with Norco, Robaxin for muscle spasm - MiraLAX and Colace for constipation if needed - Weightbearing 50% on the right leg - Physical therapy recommends home health PT - Keep dressing in place for 2-3 days, then changed daily with gauze and tape - Patient made aware he needs to follow-up with Dr. Charlann Boxer in 2 weeks post discharge Anemia of chronic disease - Hemoglobin stable, no signs of active bleeding - No CBC obtained this morning, will repeat in the morning - Continue iron supplements upon discharge  DVT prophylaxis - Lovenox SQ while inpatient  Code Status: Full.  Family Communication:  plan of care discussed with the patient Disposition Plan: Home in a.m.  IV access:  Peripheral IV  Procedures and diagnostic studies:    Dg Chest 1 View  11/11/2014  No active disease.     Dg Hip Unilat With Pelvis 2-3 Views Right  11/11/2014  Comminuted and angulated right intertrochanteric fracture.    Dg Femur, Min 2 Views Right  11/12/2014  ORIF right intertrochanteric femoral fracture without evidence of complication.    Medical Consultants:  Ortho   Other Consultants:  PT/OT  IAnti-Infectives:   None   Debbora Presto, MD  [facility] Pager [phone]  If 7PM-7AM, please contact night-coverage [email or web address] Password [email or web address] 11/12/2014, 3:22  PM   LOS: 1 day   HPI/Subjective: Pt with right hip pain.   Objective: Filed Vitals:   11/12/14 0253 11/12/14 0400 11/12/14 0519 11/12/14 1013  BP: 132/78  111/75 115/63  Pulse: 68  70 79  Temp: 98.3 F (36.8 C)  98.2 F (36.8 C) 98 F (36.7 C)  TempSrc: Oral  Oral Axillary  Resp: 16 16 16 16   Height:      Weight:      SpO2: 100% 100% 100% 95%    Intake/Output Summary (Last 24 hours) at 11/12/14 1522 Last data filed at 11/12/14 1100  Gross per 24 hour  Intake 2591.66 ml  Output   1175 ml  Net 1416.66 ml    Exam:   General:  Pt is alert, follows commands appropriately, not in acute distress  Cardiovascular: Regular rate and rhythm, S1/S2, no murmurs, no rubs, no gallops  Respiratory: Clear to auscultation bilaterally, no wheezing, no crackles, no rhonchi  Abdomen: Soft, non tender, non distended, bowel sounds present, no guarding  Extremities: No edema, pulses DP and PT palpable bilaterally  Neuro: Grossly nonfocal  Data Reviewed: Basic Metabolic Panel:  Recent Labs Lab 11/11/14 1717  NA 142  K 3.7  CL 108  CO2 29  GLUCOSE 97  BUN 19  CREATININE 0.80  CALCIUM 8.8   CBC:  Recent Labs Lab 11/11/14 1717  WBC 9.2  NEUTROABS 7.5  HGB 12.2*  HCT 36.9*  MCV 96.3  PLT 198    Scheduled Meds: . docusate sodium  100 mg Oral  BID  . enoxaparin (LOVENOX) injection  40 mg Subcutaneous Q24H  . ferrous sulfate  325 mg Oral TID PC  . polyethylene glycol  17 g Oral BID   Continuous Infusions: . sodium chloride 50 mL/hr at 11/11/14 2332  . sodium chloride 0.9 % 1,000 mL with potassium chloride 10 mEq infusion 100 mL/hr at 11/12/14 0030

## 2014-11-12 NOTE — Care Management Note (Addendum)
    Page 1 of 2   11/12/2014     1:51:25 PM CARE MANAGEMENT NOTE 11/12/2014  Patient:  Luis Marquez, Luis Marquez   Account Number:  [id number]  Date Initiated:  11/12/2014  Documentation initiated by:  [facility]  Subjective/Objective Assessment:   adm: IM nail R hip per Dr. Alvan Dame on 11/11/14      Action/Plan:   discharge planning   Anticipated DC Date:  11/13/2014   Anticipated DC Plan:  [place]  CM consult      Choice offered to / List presented to:     DME arranged  Vassie Moselle      DME agency  [facility] arranged  [place].   Status of service:  Completed, signed off Medicare Important Message given?   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  CM also called [employer], (as pt is listed as a WC case but Daryl of [employer] states this is NOT a Workers Comp case and pt is self pay.  [facility] rep, Cyril Mourning accepts the referral as a charity and CM has called [employer] rep, Lecretia to request a rolling walker (also chairty) be delivered to room prior to discharge.  CM called Lura Em of [employer] to make aware of WC vs Self Pay status.   No other CM needs were communicated.  Mariane Masters, BSN, Cm [phone].

## 2014-11-12 NOTE — Progress Notes (Addendum)
   11/12/14 1600  PT Visit Information  Last PT Received On 11/12/14  Assistance Needed +1  History of Present Illness 54 yo male adm after fall resulting in R hip fx--->s/p IM nail R hip per Dr. Charlann Boxer on 11/11/14  Pt very concerned about D/C to the "group" home, wants SNF/discussed with SW; will continue to follow for needs, pt may need extra day in hospital if he has to D/C home;needs to be mod I.  PT Time Calculation  PT Start Time (ACUTE ONLY) 1620  PT Stop Time (ACUTE ONLY) 1640  PT Time Calculation (min) (ACUTE ONLY) 20 min  Subjective Data  Subjective I am not sure about going back to the house  Patient Stated Goal get back to being independent  Precautions  Precautions Fall  Restrictions  RLE Weight Bearing PWB  RLE Partial Weight Bearing Percentage or Pounds 50%  Pain Assessment  Pain Assessment 0-10  Pain Score 4  Pain Location right hip  Pain Descriptors / Indicators Contraction  Pain Intervention(s) Limited activity within patient's tolerance;Monitored during session;Repositioned;Ice applied  Cognition  Arousal/Alertness Awake/alert  Behavior During Therapy WFL for tasks assessed/performed  Overall Cognitive Status Within Functional Limits for tasks assessed  Bed Mobility  Overal bed mobility Needs Assistance  Bed Mobility Supine to Sit  Supine to sit Min assist  General bed mobility comments cues for technique, assist with RLE, may need leg lifter  Transfers  Overall transfer level Needs assistance  Equipment used Rolling walker (2 wheeled)  Transfers Sit to/from Stand  Sit to Stand Supervision;From elevated surface  General transfer comment cues for hand placement and RLE position; supervision  for stand to sit to lower surface    Ambulation/Gait  Ambulation/Gait assistance Supervision  Ambulation Distance (Feet) 80 Feet  Assistive device Rolling walker (2 wheeled)  Gait Pattern/deviations Step-to pattern;Antalgic  General Gait Details cues for sequence,  PWB   Pt completed AP and QS x 10 bil LEs  PT - End of Session  Equipment Utilized During Treatment Gait belt  Activity Tolerance Patient tolerated treatment well  Patient left in chair;with call bell/phone within reach  Nurse Communication Mobility status  PT - Assessment/Plan  PT Plan Current plan remains appropriate  PT Frequency (ACUTE ONLY) 7X/week  Follow Up Recommendations Home health PT  PT equipment Rolling walker with 5" wheels  PT Goal Progression  Progress towards PT goals Progressing toward goals  Acute Rehab PT Goals  PT Goal Formulation With patient  Time For Goal Achievement 11/19/14  Potential to Achieve Goals Good  PT General Charges  $$ ACUTE PT VISIT 1 Procedure  PT Treatments  $Gait Training 8-22 mins

## 2014-11-12 NOTE — Anesthesia Postprocedure Evaluation (Signed)
  Anesthesia Post-op Note  Patient: Luis Marquez  Procedure(s) Performed: Procedure(s): INTRAMEDULLARY (IM) NAIL INTERTROCHANTRIC (Right)  Patient Location: PACU  Anesthesia Type:General  Level of Consciousness: awake and alert   Airway and Oxygen Therapy: Patient Spontanous Breathing  Post-op Pain: mild  Post-op Assessment: Post-op Vital signs reviewed, Patient's Cardiovascular Status Stable and Respiratory Function Stable  Post-op Vital Signs: Reviewed  Filed Vitals:   11/12/14 1600  BP:   Pulse:   Temp:   Resp: 16    Complications: No apparent anesthesia complications

## 2014-11-12 NOTE — Progress Notes (Signed)
CSW assisting with with d/c planning. PN reviewed. PT has recommended HHPT following hospital d/c. Met with pt and spoke to Freight forwarder at [employer] ( half way house ). Pt plans to [facility] to [facility]  with [facility] following hospital d/c. RNCM will assist with d/c planning.  Werner Lean LCSW [phone]

## 2014-11-12 NOTE — Progress Notes (Addendum)
Foley cathter left in place per pt request as he return from surgery at 0030. Will make am staff aware to d/c Foley when up with PT for eval.

## 2014-11-12 NOTE — Evaluation (Signed)
Occupational Therapy Evaluation Patient Details Name: Luis Marquez MRN: [id number] DOB: 1961/01/25 Today's Date: 11/12/2014    History of Present Illness 54 yo male adm after fall resulting in R hip fx--->s/p IM nail R hip per Dr. Charlann Boxer on 11/11/14   Clinical Impression   Pt was admitted for the above.  At baseline, he is independent with ADLs.  Pt will benefit from skilled OT with mostly mod I level goals, using AE and following RLE PWB to safely complete ADLs.  Pt currently needs min A overall.    Follow Up Recommendations  Home health OT (vs SNF, depending upon progress.  Pt needs to be mod I)    Equipment Recommendations  3 in 1 bedside comode (reacher, long sponge, long shoehorn, wide sock aide)    Recommendations for Other Services       Precautions / Restrictions Precautions Precautions: Fall Restrictions RLE Weight Bearing: Partial weight bearing RLE Partial Weight Bearing Percentage or Pounds: 50%      Mobility Bed Mobility Overal bed mobility: Needs Assistance Bed Mobility: Supine to Sit     Supine to sit: Min assist     General bed mobility comments: cues for technique, assist with RLE  Transfers Overall transfer level: Needs assistance Equipment used: Rolling walker (2 wheeled) Transfers: Sit to/from Stand Sit to Stand: Min assist         General transfer comment: cues for hand placement and RLE position    Balance Overall balance assessment: History of Falls;Needs assistance         Standing balance support: No upper extremity supported Standing balance-Leahy Scale: Fair                              ADL Overall ADL's : Needs assistance/impaired     Grooming: Set up;Sitting   Upper Body Bathing: Set up;Sitting   Lower Body Bathing: Minimal assistance;Sit to/from stand;With adaptive equipment   Upper Body Dressing : Set up;Sitting   Lower Body Dressing: Minimal assistance;With adaptive equipment;Sit to/from stand    Toilet Transfer: Minimal assistance;Ambulation;BSC;RW             General ADL Comments: Pt educated on AE and practiced with this.  Recommend reacher, long sponge, long shoehorn, and wide sock aide for home.   Ambulated to bathroom and practiced transfer to 3:1 over commode.  Pt needed safety cues to sidestep through tight space     Vision     Perception     Praxis      Pertinent Vitals/Pain Pain Assessment: 0-10 Pain Score: 6  Pain Descriptors / Indicators: Sore;Aching Pain Intervention(s): Limited activity within patient's tolerance;Monitored during session;Repositioned;Patient requesting pain meds-RN notified;Ice applied     Hand Dominance     Extremity/Trunk Assessment Upper Extremity Assessment Upper Extremity Assessment: Defer to OT evaluation          Communication Communication Communication: No difficulties   Cognition Arousal/Alertness: Awake/alert Behavior During Therapy: WFL for tasks assessed/performed Overall Cognitive Status: Within Functional Limits for tasks assessed                     General Comments       Exercises       Shoulder Instructions      Home Living Family/patient expects to be discharged to:: Group home  Additional Comments: pt currently lives in a house with STE.  Has low toilet and tub.  He may be moved into mail building. There is a walk in shower and low toilet.      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    OT Diagnosis: Acute pain;Generalized weakness   OT Problem List: Decreased strength;Decreased activity tolerance;Decreased knowledge of use of DME or AE;Pain;Decreased safety awareness   OT Treatment/Interventions: Self-care/ADL training;Patient/family education;DME and/or AE instruction    OT Goals(Current goals can be found in the care plan section) Acute Rehab OT  Goals Patient Stated Goal: get back to being independent OT Goal Formulation: With patient Time For Goal Achievement: 11/19/14 Potential to Achieve Goals: Good ADL Goals Pt Will Perform Lower Body Bathing: with modified independence;with adaptive equipment;sit to/from stand (including retrieval of supplies) Pt Will Perform Lower Body Dressing: with modified independence;with adaptive equipment;sit to/from stand (including gathering clothes) Pt Will Transfer to Toilet: with modified independence;bedside commode;ambulating Pt Will Perform Toileting - Clothing Manipulation and hygiene: with modified independence;sit to/from stand Pt Will Perform Tub/Shower Transfer: Shower transfer;with supervision;ambulating;3 in 1  OT Frequency: Min 2X/week   Barriers to D/C:            Co-evaluation              End of Session    Activity Tolerance: Patient tolerated treatment well Patient left: in chair;with call bell/phone within reach   Time: 1610-9604 OT Time Calculation (min): 31 min Charges:  OT General Charges $OT Visit: 1 Procedure OT Evaluation $Initial OT Evaluation Tier I: 1 Procedure OT Treatments $Self Care/Home Management : 8-22 mins G-Codes:    Jamayia Croker 11/16/2014, 2:12 PM Marica Otter, OTR/L [phone] 11/16/14

## 2014-11-12 NOTE — Evaluation (Addendum)
Physical Therapy Evaluation Patient Details Name: Luis Marquez MRN: [id number] DOB: [birth year] Today's Date: 11/12/2014   History of Present Illness  54 yo male adm after fall resulting in R hip fx--->s/p IM nail R hip per Dr. Charlann Boxer on 11/11/14  Clinical Impression  Pt will benefit from PT to address deficits below; Pt did very well today and should be able to return to his prior living arrangement with HHPT, he will need to use the RW until allowed FWB RLE.    Follow Up Recommendations Home health PT    Equipment Recommendations  Rolling walker with 5" wheels    Recommendations for Other Services       Precautions / Restrictions Precautions Precautions: Fall Restrictions RLE Weight Bearing: Partial weight bearing RLE Partial Weight Bearing Percentage or Pounds: 50%      Mobility  Bed Mobility Overal bed mobility: Needs Assistance Bed Mobility: Supine to Sit     Supine to sit: Min assist     General bed mobility comments: cues for technique, assist with RLE  Transfers Overall transfer level: Needs assistance Equipment used: Rolling walker (2 wheeled) Transfers: Sit to/from Stand Sit to Stand: Min assist         General transfer comment: cues for hand placement and RLE position  Ambulation/Gait Ambulation/Gait assistance: Min guard Ambulation Distance (Feet): 70 Feet Assistive device: Rolling walker (2 wheeled) Gait Pattern/deviations: Step-to pattern;Antalgic     General Gait Details: cues for sequence, PWB  Stairs            Wheelchair Mobility    Modified Rankin (Stroke Patients Only)       Balance Overall balance assessment: History of Falls;Needs assistance         Standing balance support: No upper extremity supported Standing balance-Leahy Scale: Fair                               Pertinent Vitals/Pain Pain Assessment: 0-10 Pain Score: 4  Pain Descriptors / Indicators: Grimacing;Guarding Pain Intervention(s):  Limited activity within patient's tolerance;Monitored during session;Repositioned;Ice applied    Home Living Family/patient expects to be discharged to:: Group home (return [facility])                      Prior Function Level of Independence: Independent         Comments: pt was working at [employer]        Extremity/Trunk Assessment   Upper Extremity Assessment: Overall WFL for tasks assessed           Lower Extremity Assessment: RLE deficits/detail RLE Deficits / Details: grossly 2+/5at hip/knee. hip flexion limited to 80*; anklel WFL       Communication   Communication: No difficulties  Cognition Arousal/Alertness: Awake/alert Behavior During Therapy: WFL for tasks assessed/performed Overall Cognitive Status: Within Functional Limits for tasks assessed                      General Comments      Exercises  ankle pumps x 10 Heel slides x 10      Assessment/Plan    PT Assessment Patient needs continued PT services  PT Diagnosis Difficulty walking   PT Problem List Decreased strength;Decreased range of motion;Decreased activity tolerance;Decreased mobility;Decreased knowledge of use of DME  PT Treatment Interventions DME instruction;Gait training;Stair training;Functional mobility training;Therapeutic activities;Therapeutic exercise;Patient/family education  PT Goals (Current goals can be found in the Care Plan section) Acute Rehab PT Goals Patient Stated Goal: to figure out his D/C plan PT Goal Formulation: With patient Time For Goal Achievement: 11/19/14 Potential to Achieve Goals: Good    Frequency 7X/week   Barriers to discharge        Co-evaluation               End of Session Equipment Utilized During Treatment: Gait belt Activity Tolerance: Patient tolerated treatment well Patient left: in chair;with call bell/phone within reach Nurse Communication: Mobility status         Time:  2458-0998 PT Time Calculation (min) (ACUTE ONLY): 24 min   Charges:   PT Evaluation $Initial PT Evaluation Tier I: 1 Procedure PT Treatments $Gait Training: 8-22 mins   PT G Codes:        Luis Marquez 2014-12-06, 10:27 AM

## 2014-11-12 NOTE — Op Note (Signed)
NAMEEVANGELOS, DU NO.:  [id number]  MEDICAL RECORD NO.:  [id number]  LOCATION:  1617                         FACILITY:  [facility]  PHYSICIAN:  Madlyn Frankel. Charlann Boxer, M.D.  DATE OF BIRTH:  Jan 17, 1961  DATE OF PROCEDURE:  11/11/2014 DATE OF DISCHARGE:                              OPERATIVE REPORT   PREOPERATIVE DIAGNOSIS:  Comminuted right intertrochanteric/proximal femur fracture.  POSTOPERATIVE DIAGNOSIS:  Comminuted right intertrochanteric/proximal femur fracture.  PROCEDURES:  Open reduction and internal fixation, right proximal femur fracture consistent with a comminuted peritroch fracture with intertroch and subtroch extension with a Biomet Affixus nail 11 x 420 mm with 120 mm lag screw and a distal interlock at 15 mm.  SURGEON:  Madlyn Frankel. Charlann Boxer, M.D.  ASSISTANT:  Surgical Team.  ANESTHESIA:  General.  SPECIMENS:  None.  COMPLICATION:  None.  BLOOD LOSS:  About 100-150 mL.  INDICATION FOR PROCEDURE:  Mr. Luis Marquez is a 54 year old male, who unfortunately got his feet tripped up on while he was working, falling directly on his right hip on cement.  He was brought to the emergency room with inability to bear weight and significant pain.  Radiographs revealed a comminuted intertrochanteric femur fracture pattern.  Medicine was consulted for admission. Orthopedic was consulted for management of his fracture.  He was seen and evaluated in the emergency room, noted to have a significantly short external rotator and extremity with a lot of pain.  He did not have any ipsilateral knee or ankle problems, contralateral lower extremity or upper extremity pathology.  Risks, benefits, and necessity of fracture fixation were discussed.  The persistence of pain and dysfunction were reviewed and stressed.  Risks of infection, nonunion, need for future surgeries, were also reviewed in the setting.  Consent was obtained for benefit of fracture.  PROCEDURE IN DETAIL:   The patient was brought to the operative theater. Once adequate anesthesia and preoperative antibiotics, 2 g Ancef administered, he was positioned onto the fracture table.  Peroneal post was provided at the peroneal post retraction.  His left unaffected extremity was flexed and abducted with peroneal nerve padded and protected.  His right foot was placed in traction boot.  Traction internal rotation was applied across the fracture to reduce this under fluoroscopic imaging.  Once confirmed in reduction, the right hip was prepped and draped in a sterile fashion using shower curtain technique, but allow for distal fixation.  The time-out was performed identifying the patient, planned procedure, and extremity.  At this point, fluoroscopy was brought back to the field.  The tip of the trochanter was identified.  The lateral incision was made through skin and into the gluteal fascia.  A guidewire was then inserted into the tip of the trochanter and passed into the proximal aspect of the femoral shaft bypassing the fracture site.  The proximal femur was then opened with starting drill.  I then placed a ball-tip guidewire and measured at the distal physis of the femur and selected a 420 mm nail.  At this point, I began reaming with a 10-mm reamer, reamed up to 13 mm and 11 mm nail.  The 11 x 420 mm nail was opened  for this right hip fracture and then passed by hand to its appropriate depth.  The ball- tipped guidewire removed.  Once the nail was in its appropriate depth, the insertion guide or jig was used to pass the guidewire into the center of the femoral head on AP view, slightly inferior, and slightly posterior on the lateral view.  Once I was satisfied with this position and orientation and selected 120 mm lag screw, I then drilled and passed the lag screw without difficulty, but good bone quality in the femoral head region subcondylarly.  Once this was stabilized, the traction was  let off.  The leg was slightly abducted and the distal interlock was placed under perfect circle technique.  At this point, final radiographs were obtained in AP and lateral planes showing overall satisfied and near anatomic reduction with this significantly comminuted fracture site.  The wounds were irrigated with normal saline solution.  I reapproximated the iliotibial band fascia on the lag screw insertion site as well as the gluteal fascia proximally.  The remainder of wounds were all closed with 2-0 Vicryl and staples on the skin.  The skin was cleaned, dried, and dressed sterilely using Mepilex dressing.  He was brought to the recovery room in stable condition, tolerating the procedure well.  He will be partial weightbearing for at least 6-8 weeks until I get fractured union prior to progressing weightbearing as tolerated.  I do think that he lives in [place] and his followup care can either be here or there, we will direct that as needed to be, anticipate be in the hospital for another 2-3 days with physical therapy evaluation for placement critical.     Madlyn Frankel. Charlann Boxer, M.D.     MDO/MEDQ  D:  11/12/2014  T:  11/12/2014  Job:  [id number]

## 2014-11-13 DIAGNOSIS — D6489 Other specified anemias: Secondary | ICD-10-CM

## 2014-11-13 DIAGNOSIS — S72141A Displaced intertrochanteric fracture of right femur, initial encounter for closed fracture: Principal | ICD-10-CM

## 2014-11-13 LAB — CBC
HEMATOCRIT: 27.8 % — AB (ref 39.0–52.0)
HEMOGLOBIN: 9 g/dL — AB (ref 13.0–17.0)
MCH: 31 pg (ref 26.0–34.0)
MCHC: 32.4 g/dL (ref 30.0–36.0)
MCV: 95.9 fL (ref 78.0–100.0)
Platelets: 140 10*3/uL — ABNORMAL LOW (ref 150–400)
RBC: 2.9 MIL/uL — AB (ref 4.22–5.81)
RDW: 13.1 % (ref 11.5–15.5)
WBC: 7.1 10*3/uL (ref 4.0–10.5)

## 2014-11-13 LAB — BASIC METABOLIC PANEL
Anion gap: 6 (ref 5–15)
BUN: 14 mg/dL (ref 6–23)
CHLORIDE: 104 mmol/L (ref 96–112)
CO2: 28 mmol/L (ref 19–32)
Calcium: 8.1 mg/dL — ABNORMAL LOW (ref 8.4–10.5)
Creatinine, Ser: 0.62 mg/dL (ref 0.50–1.35)
GFR calc Af Amer: >90 mL/min (ref >90)
GFR calc non Af Amer: >90 mL/min (ref >90)
Glucose, Bld: 125 mg/dL — ABNORMAL HIGH (ref 70–99)
Potassium: 3.7 mmol/L (ref 3.5–5.1)
SODIUM: 138 mmol/L (ref 135–145)

## 2014-11-13 NOTE — Progress Notes (Signed)
Physical Therapy Treatment Patient Details Name: Angle Herod MRN: [id number] DOB: February 18, 1961 Today's Date: Nov 27, 2014    History of Present Illness 54 yo male adm after fall resulting in R hip fx--->s/p IM nail R hip per Dr. Charlann Boxer on 11/11/14    PT Comments    Progressing, bed mobility improved today  Follow Up Recommendations  Home health PT     Equipment Recommendations  Rolling walker with 5" wheels    Recommendations for Other Services       Precautions / Restrictions Precautions Precautions: Fall Restrictions Weight Bearing Restrictions: Yes RLE Weight Bearing: Partial weight bearing RLE Partial Weight Bearing Percentage or Pounds: 50%    Mobility  Bed Mobility Overal bed mobility: Needs Assistance Bed Mobility: Sit to Supine     Supine to sit: Min guard Sit to supine: Min guard (pt using gait belt as leg lifter)   General bed mobility comments: cues for technique, assist with RLE, cues for leg lifter use  Transfers Overall transfer level: Needs assistance Equipment used: Rolling walker (2 wheeled) Transfers: Sit to/from Stand Sit to Stand: Supervision         General transfer comment: cues for hand placement and RLE position; incr time required  Ambulation/Gait Ambulation/Gait assistance: Supervision Ambulation Distance (Feet): 80 Feet Assistive device: Rolling walker (2 wheeled) Gait Pattern/deviations: Step-to pattern   Gait velocity interpretation: Below normal speed for age/gender General Gait Details: cues for sequence, PWB, trunk extension   Stairs            Wheelchair Mobility    Modified Rankin (Stroke Patients Only)       Balance           Standing balance support: No upper extremity supported Standing balance-Leahy Scale: Fair                      Cognition Arousal/Alertness: Awake/alert Behavior During Therapy: WFL for tasks assessed/performed Overall Cognitive Status: Within Functional Limits for tasks  assessed                      Exercises      General Comments        Pertinent Vitals/Pain Pain Assessment: 0-10 Pain Score: 5  Pain Location: R hip Pain Descriptors / Indicators: Aching;Constant;Contraction Pain Intervention(s): Limited activity within patient's tolerance;Monitored during session;Ice applied;Repositioned    Home Living                      Prior Function            PT Goals (current goals can now be found in the care plan section) Acute Rehab PT Goals Patient Stated Goal: get back to being independent PT Goal Formulation: With patient Time For Goal Achievement: 11/19/14 Potential to Achieve Goals: Good Progress towards PT goals: Progressing toward goals    Frequency  7X/week    PT Plan Current plan remains appropriate    Co-evaluation             End of Session Equipment Utilized During Treatment: Gait belt Activity Tolerance: Patient tolerated treatment well Patient left: in bed;with call bell/phone within reach     Time: 1454-1520 PT Time Calculation (min) (ACUTE ONLY): 26 min  Charges:  $Gait Training: 8-22 mins $Therapeutic Activity: 8-22 mins                    G Codes:      Toluwani Ruder 11/27/14, 3:26  PM   

## 2014-11-13 NOTE — Progress Notes (Signed)
Patient ID: Luis Marquez, male   DOB: 1961/07/11, 54 y.o.   MRN: [id number]  [facility] PROGRESS NOTE  Luis Marquez JOI:[id number] DOB: July 11, 1961 DOA: 11/11/2014   PCP: Patient has no PCP  Brief narrative:    54 y.o. male presented after a fall with right hip pain and suffered subsequent right hip fracture.  Assessment/Plan:    Active Problems: Mechanical fall with subsequent right intratrochanteric fracture - Patient is now status post ORIF, postop day 2 - Appreciate orthopedic team following - he reports feeling better but with persistent spasms in the RLE - Recommendation is to continue with physical therapy, up as tolerated - DVT prophylaxis for 4 weeks aspirin 325 mg twice a day - Pain management with Norco, Robaxin for muscle spasm - MiraLAX and Colace for constipation if needed - Weightbearing 50% on the right leg - Keep dressing in place for 2 days, then change daily with gauze and tape  Anemia of chronic disease with acute post op blood loss anemia  - Hemoglobin drop post op - no signs of active bleeding  - Continue iron supplements upon discharge - repeat CBC in AM  DVT prophylaxis - Lovenox SQ while inpatient  Code Status: Full.  Family Communication:  plan of care discussed with the patient Disposition Plan: Need to have CBC checked in Am to follow up on hg and if hg stable, possible d/c home with [facility] PT  IV access:  Peripheral IV  Procedures and diagnostic studies:    Dg Chest 1 View  11/11/2014  No active disease.     Dg Hip Unilat With Pelvis 2-3 Views Right  11/11/2014  Comminuted and angulated right intertrochanteric fracture.    Dg Femur, Min 2 Views Right  11/12/2014  ORIF right intertrochanteric femoral fracture without evidence of complication.    Medical Consultants:  Ortho   Other Consultants:  PT/OT  IAnti-Infectives:   None   Debbora Presto, MD  [facility] Pager [phone]  If 7PM-7AM, please contact  night-coverage [email or web address] Password [facility] 11/13/2014, 3:53 PM   LOS: 2 days   HPI/Subjective: Pt with right hip pain and more spasms.   Objective: Filed Vitals:   11/12/14 1600 11/12/14 2200 11/13/14 0536 11/13/14 1412  BP:  125/71 115/64 122/59  Pulse:  86 74 84  Temp:  98.2 F (36.8 C) 98.4 F (36.9 C) 98.9 F (37.2 C)  TempSrc:  Oral Oral Oral  Resp: 16 16 16 15   Height:      Weight:      SpO2: 93% 98% 97% 94%    Intake/Output Summary (Last 24 hours) at 11/13/14 1553 Last data filed at 11/13/14 1412  Gross per 24 hour  Intake   3915 ml  Output   1250 ml  Net   2665 ml    Exam:   General:  Pt is alert, follows commands appropriately, not in acute distress  Cardiovascular: Regular rate and rhythm, S1/S2, no murmurs, no rubs, no gallops  Respiratory: Clear to auscultation bilaterally, no wheezing, no crackles, no rhonchi  Abdomen: Soft, non tender, non distended, bowel sounds present, no guarding  Extremities: No edema, pulses DP and PT palpable bilaterally, TTP in the right hip area   Neuro: Grossly nonfocal  Data Reviewed: Basic Metabolic Panel:  Recent Labs Lab 11/11/14 1717 11/13/14 0420  NA 142 138  K 3.7 3.7  CL 108 104  CO2 29 28  GLUCOSE 97 125*  BUN 19 14  CREATININE 0.80 0.62  CALCIUM 8.8 8.1*  CBC:  Recent Labs Lab 11/11/14 1717 11/13/14 0420  WBC 9.2 7.1  NEUTROABS 7.5  --   HGB 12.2* 9.0*  HCT 36.9* 27.8*  MCV 96.3 95.9  PLT 198 140*    Scheduled Meds: . docusate sodium  100 mg Oral BID  . enoxaparin (LOVENOX) injection  40 mg Subcutaneous Q24H  . ferrous sulfate  325 mg Oral TID PC  . polyethylene glycol  17 g Oral BID   Continuous Infusions: . sodium chloride 50 mL/hr at 11/11/14 2332  . sodium chloride 0.9 % 1,000 mL with potassium chloride 10 mEq infusion 100 mL/hr at 11/12/14 0030

## 2014-11-13 NOTE — Progress Notes (Signed)
     Subjective: 2 Days Post-Op Procedure(s) (LRB): INTRAMEDULLARY (IM) NAIL INTERTROCHANTRIC (Right)   Patient reports pain as moderate, states that he is having muscle spasms, but otherwise doing well. He does state that he lives at [facility] and will receive minimal assistance while there.  Objective:   VITALS:   Filed Vitals:   11/13/14 0536  BP: 115/64  Pulse: 74  Temp: 98.4 F (36.9 C)  Resp: 16    Dorsiflexion/Plantar flexion intact Incision: dressing C/D/I No cellulitis present Compartment soft  LABS  Recent Labs  11/11/14 1717 11/13/14 0420  HGB 12.2* 9.0*  HCT 36.9* 27.8*  WBC 9.2 7.1  PLT 198 140*     Recent Labs  11/11/14 1717 11/13/14 0420  NA 142 138  K 3.7 3.7  BUN 19 14  CREATININE 0.80 0.62  GLUCOSE 97 125*     Assessment/Plan: 2 Days Post-Op Procedure(s) (LRB): INTRAMEDULLARY (IM) NAIL INTERTROCHANTRIC (Right) Up with therapy Discharge home with home health eventually when ready.   Would most likely benefit from an additional day to maximize his independence with PT and facilitate safety in the post-op period.    Ortho recommendations:  ASA 325 mg bid for 4 weeks for anticoagulation, unless other medically indicated.   Norco for pain management (Rx written).  Robaxin for muscle spasms (Rx written).  MiraLax and Colace for constipation  Iron 325 mg tid for 2 weeks   WB 50% on the right leg.  Dressing to remain in place for 2-3 days (Thurs-Fri), then changed daily with gauze and tape.  Keep the area dry and clean.  Follow up in 2 weeks at [facility]. Follow up with OLIN,Iolani Twilley D in 2 weeks.  Contact information:  [facility] [address], [address] [place] [address] [phone]            Anastasio Auerbach. Todd Jelinski   PAC  11/13/2014, 11:09 AM

## 2014-11-13 NOTE — Progress Notes (Addendum)
Physical Therapy Treatment Patient Details Name: Luis Marquez MRN: [id number] DOB: 1961/01/03 Today's Date: 11/13/2014    History of Present Illness 54 yo male adm after fall resulting in R hip fx--->s/p IM nail R hip per Dr. Charlann Boxer on 11/11/14    PT Comments    Pt continues to progress although he c/o incr hip soreness today and requires incr time for transitions  Follow Up Recommendations  Home health PT     Equipment Recommendations  Rolling walker with 5" wheels    Recommendations for Other Services       Precautions / Restrictions Precautions Precautions: Fall Restrictions Weight Bearing Restrictions: Yes RLE Weight Bearing: Partial weight bearing RLE Partial Weight Bearing Percentage or Pounds: 50%    Mobility  Bed Mobility Overal bed mobility: Needs Assistance Bed Mobility: Supine to Sit     Supine to sit: Min guard     General bed mobility comments: cues for technique, assist with RLE, cues for leg lifter technique  Transfers Overall transfer level: Needs assistance (Simultaneous filing. User may not have seen previous data.) Equipment used: Rolling walker (2 wheeled) (Simultaneous filing. User may not have seen previous data.) Transfers: Sit to/from Stand (Simultaneous filing. User may not have seen previous data.) Sit to Stand: Supervision (Simultaneous filing. User may not have seen previous data.)         General transfer comment: cues for hand placement and RLE position; supervision  for stand to sit to lower surface, incr time required (Simultaneous filing. User may not have seen previous data.)  Ambulation/Gait Ambulation/Gait assistance: Supervision;Min guard Ambulation Distance (Feet): 80 Feet Assistive device: Rolling walker (2 wheeled) Gait Pattern/deviations: Step-to pattern;Antalgic   Gait velocity interpretation: Below normal speed for age/gender General Gait Details: cues for sequence, PWB, trunk extension   Stairs             Wheelchair Mobility    Modified Rankin (Stroke Patients Only)       Balance           Standing balance support: No upper extremity supported Standing balance-Leahy Scale: Fair                      Cognition Arousal/Alertness: Awake/alert (Simultaneous filing. User may not have seen previous data.) Behavior During Therapy: [facility] for tasks assessed/performed (Simultaneous filing. User may not have seen previous data.) Overall Cognitive Status: Within Functional Limits for tasks assessed (Simultaneous filing. User may not have seen previous data.)                      Exercises  heel slides R x 10 Ankle pumps x 10 Quad sets x 10 R hip abd x 10    General Comments        Pertinent Vitals/Pain Pain Assessment: 0-10 Pain Score: 6  Pain Location: R groin Pain Descriptors / Indicators: Cramping Pain Intervention(s): Limited activity within patient's tolerance;Monitored during session;Premedicated before session (Simultaneous filing. User may not have seen previous data.)    Home Living                      Prior Function            PT Goals (current goals can now be found in the care plan section) Acute Rehab PT Goals Patient Stated Goal: get back to being independent PT Goal Formulation: With patient Time For Goal Achievement: 11/19/14 Potential to Achieve Goals: Good Progress towards PT goals:  Progressing toward goals    Frequency  7X/week    PT Plan Current plan remains appropriate    Co-evaluation             End of Session Equipment Utilized During Treatment: Gait belt Activity Tolerance: Patient tolerated treatment well Patient left: Other (comment) (in bathroom)     Time: 1100-1133 PT Time Calculation (min) (ACUTE ONLY): 33 min  Charges:  $Gait Training: 8-22 mins $Therapeutic Activity: 8-22 mins                    G Codes:      Yachet Mattson 2014-12-01, 1:29 PM

## 2014-11-13 NOTE — Progress Notes (Addendum)
Occupational Therapy Treatment Patient Details Name: Luis Marquez MRN: [id number] DOB: 08-26-1960 Today's Date: 11/13/2014    History of present illness 54 yo male adm after fall resulting in R hip fx--->s/p IM nail R hip per Dr. Charlann Boxer on 11/11/14   OT comments  Pt limited by 8/10 pain during session with working on AE for LB self care. It is very difficult for him to maneuver R LE into shoes or clothing due to pain. He has difficulty picking up R LE in sitting to slide into a shoe. Discussed SNF option with Child psychotherapist. If pt d/c back to [facility], recommend HHOT. Will continue to follow and pt will benefit from additional day of OT to progress ADL independence. Reviewed 50% weightbearing precaution for R LE with pt and he did well with this during functional transfers.     Follow Up Recommendations  Home health OT    Equipment Recommendations  3 in 1 bedside comode    Recommendations for Other Services      Precautions / Restrictions Precautions Precautions: Fall Restrictions Weight Bearing Restrictions: Yes RLE Weight Bearing: Partial weight bearing RLE Partial Weight Bearing Percentage or Pounds: 50%       Mobility Bed Mobility                  Transfers Overall transfer level: Needs assistance Equipment used: Rolling walker (2 wheeled) Transfers: Sit to/from Stand Sit to Stand: Min guard         General transfer comment: increased time. cues for hand placement.    Balance                                   ADL                       Lower Body Dressing: Moderate assistance;Sitting/lateral leans;With adaptive equipment   Toilet Transfer: Min guard;Ambulation;RW             General ADL Comments: Pt did well with the reacher to doff R sock with min cues and supervision. He donned sock with sock aid X 2 and needed min assist to pull sock up over heel fully. Sock appears to be alittle short for pt. He attempted to use reacher to  pull up sock in the back but wasnt able to quite pull it fully over heel and pt with 8/10 pain (just had pain meds shortly before session). Pt had the most difficulty with donning shoes and required mod assist to attempt to don shoe but pt with so much pain and tightness in R LE that he stopped in the middle of the task. Didnt fully don R shoe -only hafl way on. Pt states too painful to continue right now. Discussed better shoe option of a slide on loafer type shoe as his tennis shoes are very soft and the tongue and side colllapse easily making it hard for him to slide foot all the way in. He states he does have a loafter shoe.       Vision                     Perception     Praxis      Cognition   Behavior During Therapy: [facility] for tasks assessed/performed Overall Cognitive Status: Within Functional Limits for tasks assessed  Extremity/Trunk Assessment               Exercises     Shoulder Instructions       General Comments      Pertinent Vitals/ Pain       Pain Assessment: 0-10 Pain Score: 8  Pain Location: R hip/thigh Pain Descriptors / Indicators: Aching;Spasm Pain Intervention(s): Ice applied  Home Living                                          Prior Functioning/Environment              Frequency Min 2X/week     Progress Toward Goals  OT Goals(current goals can now be found in the care plan section)  Progress towards OT goals: Not progressing toward goals - comment (limited by pain)     Plan Discharge plan remains appropriate    Co-evaluation                 End of Session Equipment Utilized During Treatment: Rolling walker   Activity Tolerance Patient limited by pain   Patient Left in chair;with call bell/phone within reach   Nurse Communication          Time: 7092-9574 OT Time Calculation (min): 31 min  Charges: OT General Charges $OT Visit: 1 Procedure OT  Treatments $Self Care/Home Management : 8-22 mins $Therapeutic Activity: 8-22 mins  Lennox Laity  [phone] 11/13/2014, 1:11 PM

## 2014-11-14 DIAGNOSIS — S72141D Displaced intertrochanteric fracture of right femur, subsequent encounter for closed fracture with routine healing: Secondary | ICD-10-CM

## 2014-11-14 LAB — CBC
HCT: 25.1 % — ABNORMAL LOW (ref 39.0–52.0)
Hemoglobin: 8.4 g/dL — ABNORMAL LOW (ref 13.0–17.0)
MCH: 31.8 pg (ref 26.0–34.0)
MCHC: 33.5 g/dL (ref 30.0–36.0)
MCV: 95.1 fL (ref 78.0–100.0)
Platelets: 131 10*3/uL — ABNORMAL LOW (ref 150–400)
RBC: 2.64 MIL/uL — AB (ref 4.22–5.81)
RDW: 13.1 % (ref 11.5–15.5)
WBC: 6.7 10*3/uL (ref 4.0–10.5)

## 2014-11-14 LAB — BASIC METABOLIC PANEL
Anion gap: 4 — ABNORMAL LOW (ref 5–15)
BUN: 11 mg/dL (ref 6–23)
CO2: 28 mmol/L (ref 19–32)
Calcium: 8 mg/dL — ABNORMAL LOW (ref 8.4–10.5)
Chloride: 105 mmol/L (ref 96–112)
Creatinine, Ser: 0.58 mg/dL (ref 0.50–1.35)
GFR calc Af Amer: >90 mL/min (ref >90)
GFR calc non Af Amer: >90 mL/min (ref >90)
Glucose, Bld: 143 mg/dL — ABNORMAL HIGH (ref 70–99)
Potassium: 3.6 mmol/L (ref 3.5–5.1)
SODIUM: 137 mmol/L (ref 135–145)

## 2014-11-14 NOTE — Progress Notes (Signed)
Occupational Therapy Treatment Patient Details Name: Luis Marquez MRN: [id number] DOB: 1961-02-14 Today's Date: 11/14/2014    History of present illness 54 yo male adm after fall resulting in R hip fx--->s/p IM nail R hip per Dr. Charlann Boxer on 11/11/14   OT comments  Pt is unable to don R shoe with AE, cannot manipulate foot.  May be able to wear non-skid socks.  Needs continued practice with bed mobility from flat bed without rails and use of leg lifter  Follow Up Recommendations  SNF--pt needs min A for ADLs and hospital bed for bed mobility    Equipment Recommendations  3 in 1 bedside comode    Recommendations for Other Services      Precautions / Restrictions Precautions Precautions: Fall Restrictions RLE Weight Bearing: Partial weight bearing RLE Partial Weight Bearing Percentage or Pounds: 50%       Mobility Bed Mobility           Sit to supine: Min guard   General bed mobility comments: used leg lifter.  Encouraged pt not to use rail but he relied heavily upon this.  HOB raised 30  Transfers   Equipment used: Rolling walker (2 wheeled) Transfers: Sit to/from Stand Sit to Stand: Supervision         General transfer comment: cues for hand placement and RLE position; incr time required    Balance                                   ADL               Lower Body Bathing: Min guard;Sit to/from stand;With adaptive equipment       Lower Body Dressing: Minimal assistance;With adaptive equipment;Sit to/from stand   Toilet Transfer: Min guard;Stand-pivot (to recliner)             General ADL Comments: Practiced with AE and LB adls.  Untied tennis shoe to try to don:  pt gets stuck after using reacher to start over forefoot. He is unable to lift leg (with use of hands) and maintain shoe to finish donning.  Cannot use long shoehorn due to angle and weight on heel  Will likely need assist with this vs. wearing non-skid socks only.  Successfully  donned regular sock with standard sock aide.  Educated that he can place washcloth on floor to wash bottom of foot that he cannot reach with long sponge      Vision                     Perception     Praxis      Cognition   Behavior During Therapy: [facility] for tasks assessed/performed Overall Cognitive Status: Within Functional Limits for tasks assessed                       Extremity/Trunk Assessment               Exercises     Shoulder Instructions       General Comments      Pertinent Vitals/ Pain       Pain Score: 6  Pain Location: R hip Pain Descriptors / Indicators: Aching Pain Intervention(s): Limited activity within patient's tolerance;Monitored during session;Premedicated before session;Repositioned;Ice applied  Home Living  Prior Functioning/Environment              Frequency Min 2X/week     Progress Toward Goals  OT Goals(current goals can now be found in the care plan section)  Progress towards OT goals: Progressing toward goals     Plan      Co-evaluation                 End of Session     Activity Tolerance Patient limited by pain   Patient Left in chair;with call bell/phone within reach   Nurse Communication          Time: [phone] OT Time Calculation (min): 43 min  Charges: OT General Charges $OT Visit: 1 Procedure OT Treatments $Self Care/Home Management : 23-37 mins $Therapeutic Activity: 8-22 mins  Neema Fluegge 11/14/2014, 8:44 AM  Marica Otter, OTR/L [phone] 11/14/2014

## 2014-11-14 NOTE — Discharge Instructions (Signed)

## 2014-11-14 NOTE — Progress Notes (Signed)
CSW met with pt at bedside, to confirm pt disposition. Pt verbalized understanding of discharge today to [facility]. Patient thanked csw for concern and support. Pt states he is motivated to complete short term rehab and return to [place] when stable. Patient declined CSW calling [facility] or family. Pt calling [facility] to arrange getting a few belongings including clothes and toiletries to take to [facility]. Pt transportation to be arranged for 3pm by [facility]. CSW confirmed plan with RN. RN to call report to [phone]. No further Clinical Social Work needs, signing off.    Belia Heman, [place] Work  [employer] [phone]

## 2014-11-14 NOTE — Progress Notes (Deleted)
CIWA scale orders received by,Dr Hewett. Pt showing signs of etoh withdrawal.,talking to self and those not present,agitated,restless,hallucinations also see CIWA scale results.Pt admits to drinking several glasses of vodka every day for years.Safety sitter at bedside.Pt is refusing most meds and will only take a few sips of liquids after po meds crushed and given in apple sauce.Voiding qs on bsc,also incont in bed due to urgency. Will cont to moniter closely. VSS. Cms+ext x 4.Luis Marquez D

## 2014-11-14 NOTE — Progress Notes (Signed)
Per discussion with attending, and patient, recommended snf placement for short term rehab. CSW spoke with CSW Chi[employer]ist, and secured a letter of guarantee. CSW spoke with pt who is in agreement with plan for snf placement. Pt aware of limited facilities with letter of guarantee and is open to any placement possible to assist with rehab. Patient very grateful and hopeful for a speedy recovery to return to [facility] house.   Olga Coaster, LCSW  Clinical Social Work  [employer] [phone]

## 2014-11-14 NOTE — Progress Notes (Signed)
RN called report to Sherri at [facility]. All questions answered.   Paperwork and prescriptions sent with patient and PTAR to SNF.

## 2014-11-14 NOTE — Clinical Social Work Placement (Signed)
   CLINICAL SOCIAL WORK PLACEMENT  NOTE  Date:  11/14/2014  Patient Details  Name: Luis Marquez MRN: [id number] Date of Birth: 03-30-1961  Clinical Social Work is seeking post-discharge placement for this patient at the Skilled  Nursing Facility level of care (*CSW will initial, date and re-position this form in  chart as items are completed):  Yes   Patient/family provided with [place] Clinical Social Work Department's list of facilities offering this level of care within the geographic area requested by the patient (or if unable, by the patient's family).  Yes   Patient/family informed of their freedom to choose among providers that offer the needed level of care, that participate in Medicare, Medicaid or managed care program needed by the patient, have an available bed and are willing to accept the patient.  Yes   Patient/family informed of [facility]'s ownership interest in [facility] and [facility], as well as of the fact that they are under no obligation to receive care at these facilities.  PASRR submitted to EDS on 11/14/14     PASRR number received on 11/14/14     Existing PASRR number confirmed on       FL2 transmitted to all facilities in geographic area requested by pt/family on 11/14/14     FL2 transmitted to all facilities within larger geographic area on       Patient informed that his/her managed care company has contracts with or will negotiate with certain facilities, including the following:  [facility]     No   Patient/family informed of bed offers received.  Patient chooses bed at       Physician recommends and patient chooses bed at      Patient to be transferred to   on  .  Patient to be transferred to facility by       Patient family notified on   of transfer.  Name of family member notified:        PHYSICIAN       Additional Comment:   Pt expected to dc today with log. Pending review by [facility].    Covering CSW Olga Coaster, [place]  Clinical Social Work  [employer] [phone]     _______________________________________________ Duke Salvia, LCSW 11/14/2014, 11:31 AM

## 2014-11-14 NOTE — Discharge Summary (Addendum)
Physician Discharge Summary  Luis Marquez ZOX:[id number] DOB: 07/23/1960 DOA: 11/11/2014  PCP: Pt has no PCP in [place], follow with PCP in [place]  Admit date: 11/11/2014 Discharge date: 11/14/2014  Recommendations for Outpatient Follow-up:  1. Pt will need to follow up with PCP in 2-3 weeks post discharge 2. Please obtain BMP to evaluate electrolytes and kidney function 3. Please also check CBC to evaluate Hg and Hct levels 4. Please note that pt was advised to continue taking aspirin 324 mg po BID for DVT prophylaxis for 4 weeks per ortho recommendations   Discharge Diagnoses:  Active Problems:   Intertrochanteric fracture of right femur   Fracture, intertrochanteric, right femur   Anemia  Discharge Condition: Stable  Diet recommendation: Heart healthy diet discussed in details   History of present illness:    54 y.o. male presented after a fall with right hip pain and suffered subsequent right hip fracture.  Assessment/Plan:    Active Problems: Mechanical fall with subsequent right intratrochanteric fracture - Patient is now status post ORIF, postop day 3 - Appreciate orthopedic team following - he reports feeling better but with persistent spasms in the RLE - Recommendation is to continue with physical therapy, up as tolerated - DVT prophylaxis for 4 weeks aspirin 325 mg twice a day - Pain management with Norco, Robaxin for muscle spasm, scripts provided by ortho team  - MiraLAX and Colace for constipation if needed - Weightbearing 50% on the right leg - change dressing daily with gauze and tape - recommend d/c SNF when bed available  Anemia of chronic disease with acute post op blood loss anemia  - Hemoglobin drop post op - no signs of active bleeding  - Continue iron supplements upon discharge - no indication for transfusion   Thrombocytopenia - mild drop in Plt since admission - no sings of bleeding  - follow up CBC in ana outpatient setting   DVT  prophylaxis - aspirin 324 mg PO BID for 4 weeks   Code Status: Full.  Family Communication: plan of care discussed with the patient Disposition Plan: SNF today if bed available    IV access:  Peripheral IV  Procedures and diagnostic studies:   Dg Chest 1 View 11/11/2014 No active disease.   Dg Hip Unilat With Pelvis 2-3 Views Right 11/11/2014 Comminuted and angulated right intertrochanteric fracture.   Dg Femur, Min 2 Views Right 11/12/2014 ORIF right intertrochanteric femoral fracture without evidence of complication.   Medical Consultants:  Ortho   Other Consultants:  PT/OT  IAnti-Infectives:   None      Discharge Exam: Filed Vitals:   11/14/14 0604  BP: 124/71  Pulse: 83  Temp: 98.6 F (37 C)  Resp: 16   Filed Vitals:   11/13/14 0536 11/13/14 1412 11/13/14 2112 11/14/14 0604  BP: 115/64 122/59 139/65 124/71  Pulse: 74 84 88 83  Temp: 98.4 F (36.9 C) 98.9 F (37.2 C) 99 F (37.2 C) 98.6 F (37 C)  TempSrc: Oral Oral Oral Oral  Resp: Height:      Weight:      SpO2: 97% 94% 95% 96%    General: Pt is alert, follows commands appropriately, not in acute distress Cardiovascular: Regular rate and rhythm, no rubs, no gallops Respiratory: Clear to auscultation bilaterally, no wheezing, no crackles, no rhonchi Abdominal: Soft, non tender, non distended, bowel sounds +, no guarding Extremities: no edema, no cyanosis, pulses palpable bilaterally DP and PT Neuro: Grossly nonfocal  Discharge Instructions  Discharge Instructions    Diet - low sodium heart healthy    Complete by:  As directed      Increase activity slowly    Complete by:  As directed             Medication List    TAKE these medications        aspirin EC 325 MG tablet  Take 1 tablet (325 mg total) by mouth 2 (two) times daily. Take for 4 weeks.     docusate sodium 100 MG capsule  Commonly known as:  COLACE  Take 1 capsule (100 mg total) by mouth 2  (two) times daily.     ferrous sulfate 325 (65 FE) MG tablet  Take 1 tablet (325 mg total) by mouth 3 (three) times daily after meals.     GLUCOS-CHONDROIT-MSM-C-HYAL PO  Take 1 tablet by mouth daily.     HYDROcodone-acetaminophen 5-325 MG per tablet  Commonly known as:  NORCO/VICODIN  Take 1-2 tablets by mouth every 4 (four) hours as needed for moderate pain.     methocarbamol 500 MG tablet  Commonly known as:  ROBAXIN  Take 1 tablet (500 mg total) by mouth every 6 (six) hours as needed for muscle spasms.     multivitamin with minerals Tabs tablet  Take 1 tablet by mouth daily.     polyethylene glycol packet  Commonly known as:  MIRALAX / GLYCOLAX  Take 17 g by mouth 2 (two) times daily.           Follow-up Information    Follow up with Shelda Pal, MD. Schedule an appointment as soon as possible for a visit in 2 weeks.   Specialty:  Orthopedic Surgery   Contact information:   [address] [place] [address] [phone]       Follow up with [facility].   Why:  home health physical therapy and rolling walker   Contact information:   [address] [place] [address] [phone]        The results of significant diagnostics from this hospitalization (including imaging, microbiology, ancillary and laboratory) are listed below for reference.     Microbiology: No results found for this or any previous visit (from the past 240 hour(s)).   Labs: Basic Metabolic Panel:  Recent Labs Lab 11/11/14 1717 11/13/14 0420 11/14/14 0443  NA 142 138 137  K 3.7 3.7 3.6  CL 108 104 105  CO2 29 28 28   GLUCOSE 97 125* 143*  BUN 19 14 11   CREATININE 0.80 0.62 0.58  CALCIUM 8.8 8.1* 8.0*   CBC:  Recent Labs Lab 11/11/14 1717 11/13/14 0420 11/14/14 0443  WBC 9.2 7.1 6.7  NEUTROABS 7.5  --   --   HGB 12.2* 9.0* 8.4*  HCT 36.9* 27.8* 25.1*  MCV 96.3 95.9 95.1  PLT 198 140* 131*   CBG: No results for  input(s): GLUCAP in the last 168 hours.   SIGNED: Time coordinating discharge: 30 minutes  Debbora Presto, MD  [facility] 11/14/2014, 10:07 AM Pager [phone]  If 7PM-7AM, please contact night-coverage [email or web address] Password [email or web address]

## 2014-11-14 NOTE — Progress Notes (Signed)
Physical Therapy Treatment Patient Details Name: Luis Marquez MRN: [id number] DOB: Nov 07, 1960 Today's Date: 11/14/2014    History of Present Illness 54 yo male adm after fall resulting in R hip fx--->s/p IM nail R hip per Dr. Charlann Boxer on 11/11/14    PT Comments    Pt progressing  slowly; will benefit form SNF level therapies, Pt reports incr fatigue (decr amb distance) today; will attempt to see over weekend if PT schedules allow, however this PT has adjusted frequency d/t change in D/C plan; Pt is +1 and recommend OOB/mobilize with nursing staff over weekend as well.  Follow Up Recommendations  SNF     Equipment Recommendations  Rolling walker with 5" wheels    Recommendations for Other Services       Precautions / Restrictions Precautions Precautions: Fall Restrictions Weight Bearing Restrictions: Yes RLE Weight Bearing: Partial weight bearing RLE Partial Weight Bearing Percentage or Pounds: 50%    Mobility  Bed Mobility Overal bed mobility: Needs Assistance Bed Mobility: Sit to Supine       Sit to supine: Min guard   General bed mobility comments: used PTs gait belt in place of leg lifter, min/guard for RLE, pt requires extra time to complete  Transfers Overall transfer level: Needs assistance Equipment used: Rolling walker (2 wheeled) Transfers: Sit to/from Stand Sit to Stand: Supervision         General transfer comment: cues for hand placement and RLE position; incr time required  Ambulation/Gait Ambulation/Gait assistance: Supervision Ambulation Distance (Feet): 65 Feet Assistive device: Rolling walker (2 wheeled) Gait Pattern/deviations: Step-to pattern     General Gait Details: cues for sequence, PWB, trunk extension   Stairs            Wheelchair Mobility    Modified Rankin (Stroke Patients Only)       Balance           Standing balance support: No upper extremity supported;During functional activity Standing balance-Leahy  Scale: Fair                      Cognition Arousal/Alertness: Awake/alert Behavior During Therapy: WFL for tasks assessed/performed Overall Cognitive Status: Within Functional Limits for tasks assessed                      Exercises General Exercises - Lower Extremity Ankle Circles/Pumps: AROM;Both;5 reps Heel Slides:  (pt too painful, he is attempting to do on his own)    General Comments General comments (skin integrity, edema, etc.): pt able to stand, drink from cup without UE support      Pertinent Vitals/Pain Pain Assessment: 0-10 Pain Score: 5  Pain Location: lateral R hip Pain Descriptors / Indicators: Aching Pain Intervention(s): Limited activity within patient's tolerance;Monitored during session;Premedicated before session;Repositioned;Ice applied    Home Living                      Prior Function            PT Goals (current goals can now be found in the care plan section) Acute Rehab PT Goals Patient Stated Goal: get back to being independent PT Goal Formulation: With patient Time For Goal Achievement: 11/19/14 Potential to Achieve Goals: Good Progress towards PT goals: Progressing toward goals    Frequency  5x/wk    PT Plan Discharge plan needs to be updated    Co-evaluation  End of Session Equipment Utilized During Treatment: Gait belt Activity Tolerance: Patient tolerated treatment well Patient left: in bed;with call bell/phone within reach     Time: 1100-1125 PT Time Calculation (min) (ACUTE ONLY): 25 min  Charges:  $Gait Training: 8-22 mins                    G Codes:      Samreet [place] 2014-12-02, 11:51 AM

## 2019-03-18 ENCOUNTER — Ambulatory Visit: Payer: Self-pay | Attending: Nurse Practitioner | Admitting: Nurse Practitioner

## 2019-03-18 ENCOUNTER — Encounter: Payer: Self-pay | Admitting: Nurse Practitioner

## 2019-03-18 VITALS — Ht 73.0 in

## 2019-03-18 DIAGNOSIS — F17210 Nicotine dependence, cigarettes, uncomplicated: Secondary | ICD-10-CM

## 2019-03-18 DIAGNOSIS — Z8659 Personal history of other mental and behavioral disorders: Secondary | ICD-10-CM

## 2019-03-18 DIAGNOSIS — Z0000 Encounter for general adult medical examination without abnormal findings: Secondary | ICD-10-CM

## 2019-03-18 DIAGNOSIS — Z7689 Persons encountering health services in other specified circumstances: Secondary | ICD-10-CM

## 2019-03-18 DIAGNOSIS — R0602 Shortness of breath: Secondary | ICD-10-CM

## 2019-03-18 NOTE — Progress Notes (Signed)
Virtual Visit via Telephone Note Due to national recommendations of social distancing due to COVID 19, telehealth visit is felt to be most appropriate for this patient at this time.  I discussed the limitations, risks, security and privacy concerns of performing an evaluation and management service by telephone and the availability of in person appointments. I also discussed with the patient that there may be a patient responsible charge related to this service. The patient expressed understanding and agreed to proceed.    I connected with Luis Marquez on 03/18/19  at   8:50 AM EDT  EDT by telephone and verified that I am speaking with the correct person using two identifiers.   Consent I discussed the limitations, risks, security and privacy concerns of performing an evaluation and management service by telephone and the availability of in person appointments. I also discussed with the patient that there may be a patient responsible charge related to this service. The patient expressed understanding and agreed to proceed.   Location of Patient: Private  Residence   Location of Provider: [facility]    Persons participating in Telemedicine visit: Luis Fleming FNP-BC YY Bien CMA Luis Marquez    History of Present Illness: Telemedicine visit for: Establish care  He has not seen a  PCP in several years. He is [facility] residing [facility]. History of alcohol abuse.  He had a colonoscopy 7-8 years ago with polyps removed and did not return for repeat.  He has been a smoker 47 years and he stopped smoking for 3 years then restarted about 4 years ago. He has never had an inhaler. He does endorse shortness of breath with little exertion. He also endorses wheezing and non productive cough. He also endorses night sweats occurring every night. He states he had a negative PPD skin test years ago.   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   He has no complaints of concerns today. Will schedule for lab work, EKG, physical and Chest CT lung cancer screen.   History reviewed. No pertinent past medical history.  Past Surgical History:  Procedure Laterality Date  . HERNIA REPAIR    . Rod right leg      Family History  Problem Relation Age of Onset  . Diabetes Mother     Social History   Socioeconomic History  . Marital status: Single    Spouse name: Not on file  . Number of children: Not on file  . Years of education: Not on file  . Highest education level: Not on file  Occupational History  . Not on file  Social Needs  . Financial resource strain: Not on file  . Food insecurity    Worry: Not on file    Inability: Not on file  . Transportation needs    Medical: Not on file    Non-medical: Not on file  Tobacco Use  . Smoking status: Current Every Day Smoker  . Smokeless tobacco: Never Used  Substance and Sexual Activity  . Alcohol use: Not Currently  . Drug use: Not Currently  . Sexual activity: Yes  Lifestyle  . Physical activity    Days per week: Not on file    Minutes per session: Not on file  . Stress: Not on file  Relationships  . Social connections    Talks on phone: Not on file    Gets together: Not   on file    Attends religious service: Not on file    Active member of club or organization: Not on file    Attends meetings of clubs or organizations: Not on file    Relationship status: Not on file  Other Topics Concern  . Not on file  Social History Narrative  . Not on file     Observations/Objective: Awake, alert and oriented x 3   Review of Systems  Constitutional: Negative for fever, malaise/fatigue and weight loss.  HENT: Negative.  Negative for nosebleeds.   Eyes: Negative.  Negative for blurred vision, double vision and photophobia.  Respiratory: Positive for cough, sputum production, shortness of breath and wheezing.   Cardiovascular:  Negative.  Negative for chest pain, palpitations and leg swelling.  Gastrointestinal: Negative.  Negative for heartburn, nausea and vomiting.  Musculoskeletal: Positive for joint pain. Negative for myalgias.  Neurological: Negative.  Negative for dizziness, focal weakness, seizures and headaches.  Psychiatric/Behavioral: Negative.  Negative for suicidal ideas.    Assessment and Plan: [name] was seen today for new patient (initial visit).  Diagnoses and all orders for this visit:  Encounter to establish care -     Hemoglobin A1c; Future -     CBC; Future -     CMP14+EGFR; Future -     TSH; Future -     Lipid panel; Future -     PSA; Future -     Fecal occult blood, imunochemical([facility]); Future -     CT CHEST LUNG CA SCREEN LOW DOSE W/O CM; Future -     PPD; Future  Encounter for annual physical exam -     Hemoglobin A1c; Future -     CBC; Future -     CMP14+EGFR; Future -     TSH; Future -     Lipid panel; Future -     PSA; Future -     Fecal occult blood, imunochemical([facility]); Future -     CT CHEST LUNG CA SCREEN LOW DOSE W/O CM; Future -     PPD; Future     Follow Up Instructions Return for FASTING labs and Physical.     I discussed the assessment and treatment plan with the patient. The patient was provided an opportunity to ask questions and all were answered. The patient agreed with the plan and demonstrated an understanding of the instructions.   The patient was advised to call back or seek an in-person evaluation if the symptoms worsen or if the condition fails to improve as anticipated.  I provided 18 minutes of non-face-to-face time during this encounter including median intraservice time, reviewing previous notes, labs, imaging, medications and explaining diagnosis and management.  Gildardo Pounds, FNP-BC

## 2019-03-19 ENCOUNTER — Encounter (HOSPITAL_COMMUNITY): Payer: Self-pay | Admitting: Orthopedic Surgery

## 2019-03-22 ENCOUNTER — Ambulatory Visit: Payer: PRIVATE HEALTH INSURANCE | Attending: Family Medicine

## 2019-03-22 ENCOUNTER — Other Ambulatory Visit: Payer: Self-pay

## 2019-03-22 DIAGNOSIS — Z0000 Encounter for general adult medical examination without abnormal findings: Secondary | ICD-10-CM

## 2019-03-22 DIAGNOSIS — Z7689 Persons encountering health services in other specified circumstances: Secondary | ICD-10-CM

## 2019-03-23 LAB — CBC
Hematocrit: 41.8 % (ref 37.5–51.0)
Hemoglobin: 14 g/dL (ref 13.0–17.7)
MCH: 33.8 pg — ABNORMAL HIGH (ref 26.6–33.0)
MCHC: 33.5 g/dL (ref 31.5–35.7)
MCV: 101 fL — ABNORMAL HIGH (ref 79–97)
Platelets: 247 10*3/uL (ref 150–450)
RBC: 4.14 x10E6/uL (ref 4.14–5.80)
RDW: 13 % (ref 11.6–15.4)
WBC: 5.8 10*3/uL (ref 3.4–10.8)

## 2019-03-23 LAB — CMP14+EGFR
ALT: 15 IU/L (ref 0–44)
AST: 24 IU/L (ref 0–40)
Albumin/Globulin Ratio: 2 (ref 1.2–2.2)
Albumin: 4.7 g/dL (ref 3.8–4.9)
Alkaline Phosphatase: 83 IU/L (ref 39–117)
BUN/Creatinine Ratio: 10 (ref 10–24)
BUN: 9 mg/dL (ref 8–27)
Bilirubin Total: 0.3 mg/dL (ref 0.0–1.2)
CO2: 31 mmol/L — ABNORMAL HIGH (ref 20–29)
Calcium: 9.6 mg/dL (ref 8.6–10.2)
Chloride: 97 mmol/L (ref 96–106)
Creatinine, Ser: 0.94 mg/dL (ref 0.76–1.27)
GFR calc Af Amer: 101 mL/min/{1.73_m2} (ref >59)
GFR calc non Af Amer: 88 mL/min/{1.73_m2} (ref >59)
Globulin, Total: 2.4 g/dL (ref 1.5–4.5)
Glucose: 102 mg/dL — ABNORMAL HIGH (ref 65–99)
Potassium: 3.7 mmol/L (ref 3.5–5.2)
Sodium: 143 mmol/L (ref 134–144)
Total Protein: 7.1 g/dL (ref 6.0–8.5)

## 2019-03-23 LAB — LIPID PANEL
Chol/HDL Ratio: 1.9 ratio (ref 0.0–5.0)
Cholesterol, Total: 205 mg/dL — ABNORMAL HIGH (ref 100–199)
HDL: 109 mg/dL (ref >39)
LDL Chol Calc (NIH): 75 mg/dL (ref 0–99)
Triglycerides: 124 mg/dL (ref 0–149)
VLDL Cholesterol Cal: 21 mg/dL (ref 5–40)

## 2019-03-23 LAB — PSA: Prostate Specific Ag, Serum: 1.3 ng/mL (ref 0.0–4.0)

## 2019-03-23 LAB — TSH: TSH: 0.885 u[IU]/mL (ref 0.450–4.500)

## 2019-03-23 LAB — HEMOGLOBIN A1C
Est. average glucose Bld gHb Est-mCnc: 103 mg/dL
Hgb A1c MFr Bld: 5.2 % (ref 4.8–5.6)

## 2019-03-27 NOTE — Progress Notes (Signed)
CMA attempt to reach patient to inform on results.  No answer and LVM for call back.

## 2019-04-04 LAB — FECAL OCCULT BLOOD, IMMUNOCHEMICAL: Fecal Occult Bld: NEGATIVE

## 2019-04-22 ENCOUNTER — Other Ambulatory Visit: Payer: Self-pay

## 2019-04-22 ENCOUNTER — Encounter: Payer: Self-pay | Admitting: Nurse Practitioner

## 2019-04-22 ENCOUNTER — Ambulatory Visit: Payer: PRIVATE HEALTH INSURANCE | Attending: Nurse Practitioner | Admitting: Nurse Practitioner

## 2019-04-22 VITALS — BP 171/108 | HR 92 | Temp 98.6°F | Ht 72.0 in | Wt 207.0 lb

## 2019-04-22 DIAGNOSIS — Z0000 Encounter for general adult medical examination without abnormal findings: Secondary | ICD-10-CM

## 2019-04-22 DIAGNOSIS — Z111 Encounter for screening for respiratory tuberculosis: Secondary | ICD-10-CM

## 2019-04-22 DIAGNOSIS — I10 Essential (primary) hypertension: Secondary | ICD-10-CM

## 2019-04-22 DIAGNOSIS — R61 Generalized hyperhidrosis: Secondary | ICD-10-CM

## 2019-04-22 DIAGNOSIS — Z0001 Encounter for general adult medical examination with abnormal findings: Secondary | ICD-10-CM

## 2019-04-22 DIAGNOSIS — F17210 Nicotine dependence, cigarettes, uncomplicated: Secondary | ICD-10-CM

## 2019-04-22 DIAGNOSIS — F17200 Nicotine dependence, unspecified, uncomplicated: Secondary | ICD-10-CM

## 2019-04-22 MED ORDER — ALBUTEROL SULFATE HFA 108 (90 BASE) MCG/ACT IN AERS: 2.0000 | INHALATION_SPRAY | Freq: Four times a day (QID) | RESPIRATORY_TRACT | 1 refills | Status: DC | PRN

## 2019-04-22 MED ORDER — AMLODIPINE BESYLATE 10 MG PO TABS: 10.0000 mg | ORAL_TABLET | Freq: Every day | ORAL | 3 refills | Status: DC

## 2019-04-22 MED ORDER — CLONIDINE HCL 0.1 MG PO TABS
0.2000 mg | ORAL_TABLET | Freq: Once | ORAL | Status: AC
  Administered 2019-04-22: 0.2 mg via ORAL

## 2019-04-22 MED FILL — AMLODIPINE BESYLATE 10 MG T: 10 | 30 days supply | Qty: 30 | Fill #0

## 2019-04-22 MED FILL — !VENTOLIN HFA INHALER: 108 (90 BAS | 25 days supply | Qty: 18 | Fill #0

## 2019-04-22 NOTE — Progress Notes (Signed)
Assessment & Plan:  Luis Marquez was seen today for annual exam.  Diagnoses and all orders for this visit:  Encounter for annual physical exam  Tobacco dependence -     albuterol (VENTOLIN HFA) 108 (90 Base) MCG/ACT inhaler; Inhale 2 puffs into the lungs every 6 (six) hours as needed for wheezing or shortness of breath (cough). Luis Marquez was counseled on the dangers of tobacco use, and was advised to quit. Reviewed strategies to maximize success, including removing cigarettes and smoking materials from environment, stress management and support of family/friends as well as pharmacological alternatives including: Wellbutrin, Chantix, Nicotine patch, Nicotine gum or lozenges. Smoking cessation support: smoking cessation hotline: 1-800-QUIT-NOW.  Smoking cessation classes are also available through [facility]. Call [phone] or visit our website at [email or web address].   A total of was spent on counseling for smoking cessation and Luis Marquez is not ready to quit.    Essential hypertension -     amLODipine (NORVASC) 10 MG tablet; Take 1 tablet (10 mg total) by mouth daily. -     cloNIDine (CATAPRES) tablet 0.2 mg Continue all antihypertensives as prescribed.  Remember to bring in your blood pressure log with you for your follow up appointment.  DASH/Mediterranean Diets are healthier choices for HTN.  BP Readings from Last 3 Encounters:  04/22/19 (!) 171/108  11/14/14 (!) 147/74   STARTED on amlodipine 10 mg daily.   Encounter for PPD test -     PPD    Patient has been counseled on age-appropriate routine health concerns for screening and prevention. These are reviewed and up-to-date. Referrals have been placed accordingly. Immunizations are up-to-date or declined.    Subjective:   Chief Complaint  Patient presents with  . Annual Exam   [name] 58 y.o. male presents to office today for physical.   Review of Systems  Constitutional: Positive for  diaphoresis (night sweats). Negative for fever, malaise/fatigue and weight loss.  HENT: Negative.  Negative for nosebleeds.   Eyes: Negative.  Negative for blurred vision, double vision and photophobia.  Respiratory: Negative.  Negative for cough and shortness of breath.   Cardiovascular: Negative.  Negative for chest pain, palpitations and leg swelling.  Gastrointestinal: Negative.  Negative for heartburn, nausea and vomiting.  Genitourinary: Negative.   Musculoskeletal: Negative.  Negative for myalgias.  Skin: Negative.   Neurological: Negative.  Negative for dizziness, focal weakness, seizures and headaches.  Endo/Heme/Allergies: Negative.   Psychiatric/Behavioral: Negative.  Negative for suicidal ideas.    Past Medical History:  Diagnosis Date  . Hypertension     Past Surgical History:  Procedure Laterality Date  . HERNIA REPAIR    . INTRAMEDULLARY (IM) NAIL INTERTROCHANTERIC Right 11/11/2014   Procedure: INTRAMEDULLARY (IM) NAIL INTERTROCHANTRIC;  Surgeon: Durene Romans, MD;  Location: [facility];  Service: Orthopedics;  Laterality: Right;  . Rod right leg      Family History  Problem Relation Age of Onset  . Diabetes Mother     Social History Reviewed with no changes to be made today.   Outpatient Medications Prior to Visit  Medication Sig Dispense Refill  . ferrous sulfate 325 (65 FE) MG tablet Take 1 tablet (325 mg total) by mouth 3 (three) times daily after meals.  3  . Multiple Vitamin (MULTIVITAMIN WITH MINERALS) TABS tablet Take 1 tablet by mouth daily.    Marland Kitchen docusate sodium (COLACE) 100 MG capsule Take 1 capsule (100 mg total) by mouth 2 (two) times daily. (Patient not  taking: Reported on 04/22/2019) 10 capsule 0  . GLUCOS-CHONDROIT-MSM-C-HYAL PO Take 1 tablet by mouth daily.    Marland Kitchen HYDROcodone-acetaminophen (NORCO/VICODIN) 5-325 MG per tablet Take 1-2 tablets by mouth every 4 (four) hours as needed for moderate pain. (Patient not taking: Reported on 04/22/2019) 100 tablet 0   . methocarbamol (ROBAXIN) 500 MG tablet Take 1 tablet (500 mg total) by mouth every 6 (six) hours as needed for muscle spasms. (Patient not taking: Reported on 04/22/2019) 50 tablet 0  . polyethylene glycol (MIRALAX / GLYCOLAX) packet Take 17 g by mouth 2 (two) times daily. (Patient not taking: Reported on 04/22/2019) 14 each 0   No facility-administered medications prior to visit.     No Known Allergies     Objective:    BP (!) 171/108 (BP Location: Right Arm, Patient Position: Sitting, Cuff Size: Normal)   Pulse 92   Temp 98.6 F (37 C) (Oral)   Ht 6' (1.829 m)   Wt 207 lb (93.9 kg)   SpO2 97%   BMI 28.07 kg/m  Wt Readings from Last 3 Encounters:  04/22/19 207 lb (93.9 kg)  11/12/14 175 lb (79.4 kg)    Physical Exam Constitutional:      Appearance: He is well-developed.  HENT:     Head: Normocephalic and atraumatic.     Right Ear: Hearing, tympanic membrane, ear canal and external ear normal.     Left Ear: Hearing, tympanic membrane, ear canal and external ear normal.     Nose: Nose normal. No mucosal edema or rhinorrhea.     Mouth/Throat:     Pharynx: Uvula midline.     Tonsils: No tonsillar exudate. 1+ on the right. 1+ on the left.  Eyes:     General: Lids are normal. No scleral icterus.    Conjunctiva/sclera: Conjunctivae normal.     Pupils: Pupils are equal, round, and reactive to light.  Neck:     Musculoskeletal: Normal range of motion and neck supple.     Thyroid: No thyromegaly.     Trachea: No tracheal deviation.  Cardiovascular:     Rate and Rhythm: Normal rate and regular rhythm.     Heart sounds: Normal heart sounds. No murmur. No friction rub. No gallop.   Pulmonary:     Effort: Pulmonary effort is normal. No respiratory distress.     Breath sounds: Normal breath sounds. No wheezing or rales.  Chest:     Chest wall: No mass or tenderness.     Breasts:        Right: No inverted nipple, mass, nipple discharge, skin change or tenderness.        Left:  No inverted nipple, mass, nipple discharge, skin change or tenderness.  Abdominal:     General: Bowel sounds are normal. There is no distension.     Palpations: Abdomen is soft. There is no mass.     Tenderness: There is no abdominal tenderness. There is no guarding or rebound.     Hernia: There is no hernia in the left inguinal area.  Genitourinary:    Penis: Normal.      Scrotum/Testes: Normal.        Right: Mass, tenderness or swelling not present. Right testis is descended. Cremasteric reflex is present.         Left: Mass, tenderness or swelling not present. Left testis is descended. Cremasteric reflex is present.   Musculoskeletal: Normal range of motion.        General: No tenderness.  Left foot: Deformity (cavus foot with over riding toes) present.  Feet:     Right foot:     Skin integrity: Callus present.     Left foot:     Skin integrity: Callus present.  Lymphadenopathy:     Cervical: No cervical adenopathy.     Lower Body: No right inguinal adenopathy. No left inguinal adenopathy.  Skin:    General: Skin is warm and dry.     Capillary Refill: Capillary refill takes less than 2 seconds.     Findings: No erythema.  Neurological:     Mental Status: He is alert and oriented to person, place, and time.     Cranial Nerves: Cranial nerves are intact. No cranial nerve deficit.     Sensory: Sensation is intact.     Motor: Motor function is intact. No abnormal muscle tone.     Coordination: Coordination is intact. Coordination normal.     Gait: Gait is intact.     Deep Tendon Reflexes: Reflexes normal.     Reflex Scores:      Patellar reflexes are 2+ on the right side and 2+ on the left side. Psychiatric:        Behavior: Behavior normal.        Thought Content: Thought content normal.        Judgment: Judgment normal.          Patient has been counseled extensively about nutrition and exercise as well as the importance of adherence with medications and regular  follow-up. The patient was given clear instructions to go to ER or return to medical center if symptoms don't improve, worsen or new problems develop. The patient verbalized understanding.   Follow-up: Return in about 2 weeks (around 05/06/2019) for BP recheck.   Gildardo Pounds, FNP-BC [facility] [place] [facility] [place], [place]   04/23/2019, 10:44 PM

## 2019-04-23 ENCOUNTER — Encounter: Payer: Self-pay | Admitting: Nurse Practitioner

## 2019-04-24 ENCOUNTER — Other Ambulatory Visit: Payer: Self-pay

## 2019-04-24 ENCOUNTER — Ambulatory Visit: Payer: Self-pay | Attending: Family Medicine

## 2019-04-24 DIAGNOSIS — Z111 Encounter for screening for respiratory tuberculosis: Secondary | ICD-10-CM

## 2019-04-24 LAB — TB SKIN TEST
Induration: 0 mm
TB Skin Test: NEGATIVE

## 2019-04-24 NOTE — Progress Notes (Signed)
Patient came in for PPD skin reading.  PPD was placed on patient left forearm.   PPD negative. No rise or induration on the left forearm.

## 2019-04-30 ENCOUNTER — Other Ambulatory Visit: Payer: Self-pay

## 2019-04-30 ENCOUNTER — Ambulatory Visit (HOSPITAL_COMMUNITY)
Admission: RE | Admit: 2019-04-30 | Discharge: 2019-04-30 | Disposition: A | Discharge status: Home / Self Care | Payer: Self-pay | Source: Ambulatory Visit | Attending: Nurse Practitioner | Admitting: Nurse Practitioner

## 2019-04-30 DIAGNOSIS — Z7689 Persons encountering health services in other specified circumstances: Secondary | ICD-10-CM | POA: Insufficient documentation

## 2019-04-30 DIAGNOSIS — Z0000 Encounter for general adult medical examination without abnormal findings: Secondary | ICD-10-CM | POA: Insufficient documentation

## 2019-05-06 ENCOUNTER — Ambulatory Visit: Payer: Self-pay | Attending: Family Medicine | Admitting: Pharmacist

## 2019-05-06 ENCOUNTER — Other Ambulatory Visit: Payer: Self-pay

## 2019-05-06 VITALS — BP 154/84 | HR 88

## 2019-05-06 DIAGNOSIS — I10 Essential (primary) hypertension: Secondary | ICD-10-CM

## 2019-05-06 NOTE — Patient Instructions (Signed)
Thank you for coming to see [place] today.   Blood pressure today is elevated.  Continue taking blood pressure medications as prescribed. We will discuss possible modification of your medication in 2 weeks.   Limiting salt and caffeine, as well as exercising as able for at least 30 minutes for 5 days out of the week, can also help you lower your blood pressure.  Take your blood pressure at home if you are able. Please write down these numbers and bring them to your visits.  If you have any questions about medications, please call me [phone].  Luis Marquez.

## 2019-05-06 NOTE — Progress Notes (Signed)
   S:    PCP: Zelda   Patient arrives in good spirits.  Presents to the clinic for hypertension evaluation, counseling, and management. Patient was referred and last seen by Primary Care Provider on 04/22/19.    Patient reports adherence with medications.  Current BP Medications include:  Amlodipine 10 mg daily   Dietary habits include: reports compliance with salt restriction; drinks 2 large cups of coffee throughout the morning  Exercise habits include: no exercise outside of work Family / Social history:  - DM (mother) - Current every day smoker; 0.25 to 0.5 PPD - Denies use  O:  L arm after 5 minutes rest: 154/84  Home BP readings: - Takes BP at home - reports SBPs 120-130s Last 3 Office BP readings: BP Readings from Last 3 Encounters:  05/06/19 (!) 154/84  04/22/19 (!) 171/108  11/14/14 (!) 147/74    BMET    Component Value Date/Time   NA 143 03/22/2019 0925   K 3.7 03/22/2019 0925   CL 97 03/22/2019 0925   CO2 31 (H) 03/22/2019 0925   GLUCOSE 102 (H) 03/22/2019 0925   GLUCOSE 143 (H) 11/14/2014 0443   BUN 9 03/22/2019 0925   CREATININE 0.94 03/22/2019 0925   CALCIUM 9.6 03/22/2019 0925   GFRNONAA 88 03/22/2019 0925   GFRAA 101 03/22/2019 0925   Renal function: CrCl cannot be calculated (Patient's most recent lab result is older than the maximum 21 days allowed.).  Clinical ASCVD: No  The ASCVD Risk score Mikey Bussing DC Jr., et al., 2013) failed to calculate for the following reasons:   The valid HDL cholesterol range is 20 to 100 mg/dL   A/P: Hypertension longstanding currently uncontrolled on current medications. BP Goal = <130/80 mmHg. Patient is adherent with current medications. Will recheck BP in 2 weeks to eval for additional improvement. Pt knows that we may require additional therapy in the future.  -Continued amlodipine only.  -Counseled on lifestyle modifications for blood pressure control including reduced dietary sodium, increased exercise, adequate  sleep  Results reviewed and written information provided.   Total time in face-to-face counseling 30 minutes.   F/U Clinic Visit in 2 weeks.  Benard Halsted, PharmD, [place] [phone]

## 2019-05-17 MED FILL — AMLODIPINE BESYLATE 10 MG T: 10 | 30 days supply | Qty: 30 | Fill #1

## 2019-05-17 MED FILL — !VENTOLIN HFA INHALER: 108 (90 BAS | 25 days supply | Qty: 18 | Fill #1

## 2019-05-21 ENCOUNTER — Ambulatory Visit: Payer: Self-pay

## 2019-05-21 ENCOUNTER — Other Ambulatory Visit: Payer: Self-pay

## 2019-05-21 ENCOUNTER — Ambulatory Visit: Payer: Self-pay | Attending: Family Medicine | Admitting: Pharmacist

## 2019-05-21 ENCOUNTER — Encounter: Payer: Self-pay | Admitting: Pharmacist

## 2019-05-21 ENCOUNTER — Other Ambulatory Visit: Payer: Self-pay | Admitting: Nurse Practitioner

## 2019-05-21 VITALS — BP 161/72 | HR 85

## 2019-05-21 DIAGNOSIS — I10 Essential (primary) hypertension: Secondary | ICD-10-CM

## 2019-05-21 DIAGNOSIS — F17200 Nicotine dependence, unspecified, uncomplicated: Secondary | ICD-10-CM

## 2019-05-21 MED ORDER — LISINOPRIL 20 MG PO TABS: 20.0000 mg | ORAL_TABLET | Freq: Every day | ORAL | 2 refills | Status: DC

## 2019-05-21 MED FILL — ALBUTEROL SULFATE HFA 108 (: 108 (90 BAS | 25 days supply | Qty: 18 | Fill #0

## 2019-05-21 MED FILL — LISINOPRIL 20 MG TABLET: 20 | 30 days supply | Qty: 30 | Fill #0

## 2019-05-21 NOTE — Progress Notes (Signed)
   S:    PCP: Zelda   Patient arrives in good spirits.  Presents to the clinic for hypertension evaluation, counseling, and management. Patient was referred and last seen by Primary Care Provider on 04/22/19.  I saw him 05/06/19 and BP was 154/84 (improved from 171/108).   Patient reports adherence with medications.  Current BP Medications include:  Amlodipine 10 mg daily   Dietary habits include: reports compliance with salt restriction; has cut back to 1 cup of coffee in the mornings; does not consume coffee on the weekends  Exercise habits include: no exercise outside of work Family / Social history:  - DM (mother) - Current every day smoker; 0.25 to 0.5 PPD - Denies use  O:  L arm after 5 minutes rest: 161/72  Last 3 Office BP readings: BP Readings from Last 3 Encounters:  05/21/19 (!) 161/72  05/06/19 (!) 154/84  04/22/19 (!) 171/108    BMET    Component Value Date/Time   NA 143 03/22/2019 0925   K 3.7 03/22/2019 0925   CL 97 03/22/2019 0925   CO2 31 (H) 03/22/2019 0925   GLUCOSE 102 (H) 03/22/2019 0925   GLUCOSE 143 (H) 11/14/2014 0443   BUN 9 03/22/2019 0925   CREATININE 0.94 03/22/2019 0925   CALCIUM 9.6 03/22/2019 0925   GFRNONAA 88 03/22/2019 0925   GFRAA 101 03/22/2019 0925   Renal function: CrCl cannot be calculated (Patient's most recent lab result is older than the maximum 21 days allowed.).  Clinical ASCVD: No  The ASCVD Risk score Mikey Bussing DC Jr., et al., 2013) failed to calculate for the following reasons:   The valid HDL cholesterol range is 20 to 100 mg/dL   A/P: Hypertension longstanding currently uncontrolled on current medications. BP Goal = <130/80 mmHg. Patient is adherent with current medications. I recommend adding lisinopril. Pt is amenable. Will plan for recheck with labs in one month.  -Continued amlodipine. -Start lisinopril 20 mg daily.   -F/u labs planned - BMP next month.  -Counseled on lifestyle modifications for blood pressure  control including reduced dietary sodium, increased exercise, adequate sleep  Results reviewed and written information provided.   Total time in face-to-face counseling 30 minutes.   F/U Clinic Visit in 2 weeks.  Benard Halsted, PharmD, [place] [phone]

## 2019-05-21 NOTE — Patient Instructions (Signed)
Thank you for coming to see [place] today.   Blood pressure today is elevated.  Continue taking amlodipine every day.   Start taking lisinopril as well. You'll take 1 tablet a day (20mg ).   Limiting salt and caffeine, as well as exercising as able for at least 30 minutes for 5 days out of the week, can also help you lower your blood pressure.  Take your blood pressure at home if you are able. Please write down these numbers and bring them to your visits.  If you have any questions about medications, please call me [phone].  Lurena Joiner

## 2019-06-17 MED FILL — ALBUTEROL SULFATE HFA 108 (: 108 (90 BAS | 25 days supply | Qty: 18 | Fill #1

## 2019-06-17 MED FILL — LISINOPRIL 20 MG TABLET: 20 | 30 days supply | Qty: 30 | Fill #1

## 2019-06-17 MED FILL — ?AMLODIPINE BESYLATE 10 MG: 10 | 30 days supply | Qty: 30 | Fill #2

## 2019-06-20 ENCOUNTER — Ambulatory Visit: Payer: Self-pay | Admitting: Pharmacist

## 2019-06-27 ENCOUNTER — Encounter: Payer: Self-pay | Admitting: Pharmacist

## 2019-06-27 ENCOUNTER — Ambulatory Visit: Payer: Self-pay | Attending: Nurse Practitioner | Admitting: Pharmacist

## 2019-06-27 ENCOUNTER — Other Ambulatory Visit: Payer: Self-pay

## 2019-06-27 VITALS — BP 158/78 | HR 90

## 2019-06-27 DIAGNOSIS — I10 Essential (primary) hypertension: Secondary | ICD-10-CM

## 2019-06-27 NOTE — Progress Notes (Signed)
   S:    PCP: Zelda   Patient arrives in good spirits.  Presents to the clinic for hypertension evaluation, counseling, and management. Patient was referred and last seen by Primary Care Provider on 04/22/19.  I saw him 05/21/19 and added lisinopril 20 mg daily.   Patient reports adherence with medications.  Current BP Medications include:  Amlodipine 10 mg daily, lisinopril 20 mg daily  Dietary habits include: reports compliance with salt restriction; has cut back to 1 cup of coffee in the mornings; does not consume coffee on the weekends  Exercise habits include: no exercise outside of work Family / Social history:  - DM (mother) - Current every day smoker; 0.25 to 0.5 PPD - Denies use  Home blood pressures:  - SBPs: 99 - 132 - DBPs 66 - 78  O:  Vitals:   06/27/19 1520  BP: (!) 158/78  Pulse: 90   Last 3 Office BP readings: BP Readings from Last 3 Encounters:  05/21/19 (!) 161/72  05/06/19 (!) 154/84  04/22/19 (!) 171/108   BMET    Component Value Date/Time   NA 143 03/22/2019 0925   K 3.7 03/22/2019 0925   CL 97 03/22/2019 0925   CO2 31 (H) 03/22/2019 0925   GLUCOSE 102 (H) 03/22/2019 0925   GLUCOSE 143 (H) 11/14/2014 0443   BUN 9 03/22/2019 0925   CREATININE 0.94 03/22/2019 0925   CALCIUM 9.6 03/22/2019 0925   GFRNONAA 88 03/22/2019 0925   GFRAA 101 03/22/2019 0925   Renal function: CrCl cannot be calculated (Patient's most recent lab result is older than the maximum 21 days allowed.).  Clinical ASCVD: No  The ASCVD Risk score Mikey Bussing DC Jr., et al., 2013) failed to calculate for the following reasons:   The valid HDL cholesterol range is 20 to 100 mg/dL   A/P: Hypertension longstanding currently uncontrolled on current medications. BP Goal = <130/80 mmHg. Patient is adherent with current medications. Today's BP is elevated but home pressures look pretty good. I will hold off on any further changes and have him follow-up with his PCP.  -Continued  amlodipine. -Start lisinopril 20 mg daily.   -Counseled on lifestyle modifications for blood pressure control including reduced dietary sodium, increased exercise, adequate sleep  Results reviewed and written information provided.   Total time in face-to-face counseling 30 minutes.   F/U Clinic Visit with PCP.   Benard Halsted, PharmD, [place] [phone]

## 2019-07-16 ENCOUNTER — Other Ambulatory Visit: Payer: Self-pay | Admitting: Nurse Practitioner

## 2019-07-16 DIAGNOSIS — F17200 Nicotine dependence, unspecified, uncomplicated: Secondary | ICD-10-CM

## 2019-07-16 MED FILL — ALBUTEROL SULFATE HFA 108 (: 108 (90 BAS | 25 days supply | Qty: 18 | Fill #0

## 2019-07-16 MED FILL — LISINOPRIL 20 MG TABLET: 20 | 30 days supply | Qty: 30 | Fill #2

## 2019-07-16 MED FILL — ?AMLODIPINE BESYLATE 10 MG: 10 | 30 days supply | Qty: 30 | Fill #3

## 2019-07-26 ENCOUNTER — Other Ambulatory Visit: Payer: Self-pay

## 2019-07-26 ENCOUNTER — Ambulatory Visit: Payer: Self-pay | Admitting: Nurse Practitioner

## 2019-08-16 ENCOUNTER — Ambulatory Visit: Payer: Self-pay | Attending: Nurse Practitioner | Admitting: Nurse Practitioner

## 2019-08-16 ENCOUNTER — Other Ambulatory Visit: Payer: Self-pay

## 2019-08-16 ENCOUNTER — Other Ambulatory Visit: Payer: Self-pay | Admitting: Family Medicine

## 2019-08-16 ENCOUNTER — Encounter: Payer: Self-pay | Admitting: Nurse Practitioner

## 2019-08-16 DIAGNOSIS — I10 Essential (primary) hypertension: Secondary | ICD-10-CM

## 2019-08-16 DIAGNOSIS — R600 Localized edema: Secondary | ICD-10-CM

## 2019-08-16 DIAGNOSIS — D649 Anemia, unspecified: Secondary | ICD-10-CM

## 2019-08-16 DIAGNOSIS — L989 Disorder of the skin and subcutaneous tissue, unspecified: Secondary | ICD-10-CM

## 2019-08-16 DIAGNOSIS — Z1159 Encounter for screening for other viral diseases: Secondary | ICD-10-CM

## 2019-08-16 DIAGNOSIS — M79601 Pain in right arm: Secondary | ICD-10-CM

## 2019-08-16 DIAGNOSIS — Z114 Encounter for screening for human immunodeficiency virus [HIV]: Secondary | ICD-10-CM

## 2019-08-16 MED ORDER — DICLOFENAC SODIUM 1 % EX GEL: 2.0000 g | Freq: Four times a day (QID) | CUTANEOUS | 1 refills | Status: AC

## 2019-08-16 MED ORDER — LISINOPRIL 20 MG PO TABS: 20.0000 mg | ORAL_TABLET | Freq: Every day | ORAL | 0 refills | Status: DC

## 2019-08-16 MED ORDER — METHOCARBAMOL 500 MG PO TABS: 500.0000 mg | ORAL_TABLET | Freq: Four times a day (QID) | ORAL | 1 refills | Status: DC | PRN

## 2019-08-16 MED ORDER — AMLODIPINE BESYLATE 10 MG PO TABS: 10.0000 mg | ORAL_TABLET | Freq: Every day | ORAL | 3 refills | Status: DC

## 2019-08-16 MED FILL — !VENTOLIN HFA INHALER: 108 (90 BAS | 25 days supply | Qty: 18 | Fill #1

## 2019-08-16 MED FILL — DICLOFENAC SODIUM 1% GEL: 1 | 12 days supply | Qty: 100 | Fill #0

## 2019-08-16 MED FILL — AMLODIPINE BESYLATE 10 MG T: 10 | 30 days supply | Qty: 30 | Fill #4

## 2019-08-16 MED FILL — METHOCARBAMOL 500 MG TABS: 500 | 15 days supply | Qty: 60 | Fill #0

## 2019-08-16 MED FILL — LISINOPRIL 20 MG TABLET: 20 | 30 days supply | Qty: 30 | Fill #0

## 2019-08-16 NOTE — Progress Notes (Signed)
Virtual Visit via Telephone Note Due to national recommendations of social distancing due to [place] 19, telehealth visit is felt to be most appropriate for this patient at this time.  I discussed the limitations, risks, security and privacy concerns of performing an evaluation and management service by telephone and the availability of in person appointments. I also discussed with the patient that there may be a patient responsible charge related to this service. The patient expressed understanding and agreed to proceed.    I connected with Luis Marquez on 08/16/19  at   3:30 PM EST  EDT by telephone and verified that I am speaking with the correct person using two identifiers.   Consent I discussed the limitations, risks, security and privacy concerns of performing an evaluation and management service by telephone and the availability of in person appointments. I also discussed with the patient that there may be a patient responsible charge related to this service. The patient expressed understanding and agreed to proceed.   Location of Patient: Private Residence   Location of Provider: [place] [facility] [place] participating in Telemedicine visit: Geryl Rankins FNP-BC [place]    History of Present Illness: Telemedicine visit for: F/U  has a past medical history of Hypertension.   Essential Hypertension Monitoring his blodd pressure at home. Average readings from radial device: 110-145/70s. He endorses medication compliance taking amlodipine 10 mg daily and lisinopril 20 mg daily.  Denies chest pain, shortness of breath, palpitations, lightheadedness, dizziness, headaches or BLE edema.  BP Readings from Last 3 Encounters:  06/27/19 (!) 158/78  05/21/19 (!) 161/72  05/06/19 (!) 154/84   Edema Endorses BLE swelling L>R. eft leg is larger than the right. This has been going on for over a year. Swelling decreases overnight. Denies shortness  of breath, chest pain, erythema or pain. States "Just swollen".  Tries to avoid salt . Eats pork 2-3 times per week. Eats in the work cafeteria several times per week.     DERM Notes 2 small hyperpigmented lesions above his forehead. The lesions are crusting. Not sure how long they have been there. There is no itching however he wants to be referred to dermatology for skin check.   Right arm pain Constantly sore. Denies bruising, joint deformity,  trauma or injury. There is soreness on the lateral side of the right arm above the elbow. Taking aleve 200 mg with no relief.   Past Medical History:  Diagnosis Date  . Hypertension     Past Surgical History:  Procedure Laterality Date  . HERNIA REPAIR    . INTRAMEDULLARY (IM) NAIL INTERTROCHANTERIC Right 11/11/2014   Procedure: INTRAMEDULLARY (IM) NAIL INTERTROCHANTRIC;  Surgeon: Luis Cancel, MD;  Location: [facility];  Service: Orthopedics;  Laterality: Right;  . Rod right leg      Family History  Problem Relation Age of Onset  . Diabetes Mother     Social History   Socioeconomic History  . Marital status: Single    Spouse name: Not on file  . Number of children: Not on file  . Years of education: Not on file  . Highest education level: Not on file  Occupational History  . Not on file  Tobacco Use  . Smoking status: Current Every Day Smoker    Packs/day: 0.25    Types: Cigarettes  . Smokeless tobacco: Never Used  Substance and Sexual Activity  . Alcohol use: Not Currently  . Drug use: Not Currently  .  Sexual activity: Yes  Other Topics Concern  . Not on file  Social History Narrative   ** Merged History Encounter **       Social Determinants of Health   Financial Resource Strain:   . Difficulty of Paying Living Expenses: Not on file  Food Insecurity:   . Worried About Charity fundraiser in the Last Year: Not on file  . Ran Out of Food in the Last Year: Not on file  Transportation Needs:   . Lack of Transportation  (Medical): Not on file  . Lack of Transportation (Non-Medical): Not on file  Physical Activity:   . Days of Exercise per Week: Not on file  . Minutes of Exercise per Session: Not on file  Stress:   . Feeling of Stress : Not on file  Social Connections:   . Frequency of Communication with Friends and Family: Not on file  . Frequency of Social Gatherings with Friends and Family: Not on file  . Attends Religious Services: Not on file  . Active Member of Clubs or Organizations: Not on file  . Attends Archivist Meetings: Not on file  . Marital Status: Not on file     Observations/Objective: Awake, alert and oriented x 3   Review of Systems  Constitutional: Negative for fever, malaise/fatigue and weight loss.  HENT: Negative.  Negative for nosebleeds.   Eyes: Negative.  Negative for blurred vision, double vision and photophobia.  Respiratory: Negative.  Negative for cough, shortness of breath and wheezing.   Cardiovascular: Positive for leg swelling. Negative for chest pain and palpitations.  Gastrointestinal: Negative.  Negative for heartburn, nausea and vomiting.  Musculoskeletal: Positive for myalgias.       SEE HPI  Skin:       SEE HPI  Neurological: Negative.  Negative for dizziness, focal weakness, seizures and headaches.  Psychiatric/Behavioral: Negative.  Negative for suicidal ideas.    Assessment and Plan: Luis Marquez was seen today for follow-up.  Diagnoses and all orders for this visit:  Essential hypertension -     lisinopril (ZESTRIL) 20 MG tablet; Take 1 tablet (20 mg total) by mouth daily. -     amLODipine (NORVASC) 10 MG tablet; Take 1 tablet (10 mg total) by mouth daily. -     CMP14+EGFR; Future Continue all antihypertensives as prescribed.  Remember to bring in your blood pressure log with you for your follow up appointment.  DASH/Mediterranean Diets are healthier choices for HTN.    Skin lesion -     Ambulatory referral to Dermatology  Edema of both  lower extremities -     Brain natriuretic peptide; Future  Anemia, unspecified type -     CBC; Future  Right arm pain -     methocarbamol (ROBAXIN) 500 MG tablet; Take 1 tablet (500 mg total) by mouth every 6 (six) hours as needed for muscle spasms. -     diclofenac Sodium (VOLTAREN) 1 % GEL; Apply 2 g topically 4 (four) times daily.     Follow Up Instructions Return in about 3 months (around 11/14/2019).     I discussed the assessment and treatment plan with the patient. The patient was provided an opportunity to ask questions and all were answered. The patient agreed with the plan and demonstrated an understanding of the instructions.   The patient was advised to call back or seek an in-person evaluation if the symptoms worsen or if the condition fails to improve as anticipated.  I provided  19 minutes of non-face-to-face time during this encounter including median intraservice time, reviewing previous notes, labs, imaging, medications and explaining diagnosis and management.  Gildardo Pounds, FNP-BC

## 2019-08-22 ENCOUNTER — Other Ambulatory Visit: Payer: Self-pay

## 2019-08-22 ENCOUNTER — Ambulatory Visit: Payer: Self-pay | Attending: Nurse Practitioner

## 2019-08-22 DIAGNOSIS — I10 Essential (primary) hypertension: Secondary | ICD-10-CM

## 2019-08-22 DIAGNOSIS — R600 Localized edema: Secondary | ICD-10-CM

## 2019-08-22 DIAGNOSIS — D649 Anemia, unspecified: Secondary | ICD-10-CM

## 2019-08-22 DIAGNOSIS — Z114 Encounter for screening for human immunodeficiency virus [HIV]: Secondary | ICD-10-CM

## 2019-08-22 DIAGNOSIS — Z1159 Encounter for screening for other viral diseases: Secondary | ICD-10-CM

## 2019-08-23 LAB — CMP14+EGFR
ALT: 14 IU/L (ref 0–44)
AST: 23 IU/L (ref 0–40)
Albumin/Globulin Ratio: 1.7 (ref 1.2–2.2)
Albumin: 4.4 g/dL (ref 3.8–4.9)
Alkaline Phosphatase: 92 IU/L (ref 39–117)
BUN/Creatinine Ratio: 10 (ref 10–24)
BUN: 8 mg/dL (ref 8–27)
Bilirubin Total: 0.6 mg/dL (ref 0.0–1.2)
CO2: 25 mmol/L (ref 20–29)
Calcium: 9.4 mg/dL (ref 8.6–10.2)
Chloride: 100 mmol/L (ref 96–106)
Creatinine, Ser: 0.79 mg/dL (ref 0.76–1.27)
GFR calc Af Amer: 113 mL/min/{1.73_m2} (ref >59)
GFR calc non Af Amer: 98 mL/min/{1.73_m2} (ref >59)
Globulin, Total: 2.6 g/dL (ref 1.5–4.5)
Glucose: 119 mg/dL — ABNORMAL HIGH (ref 65–99)
Potassium: 3.9 mmol/L (ref 3.5–5.2)
Sodium: 143 mmol/L (ref 134–144)
Total Protein: 7 g/dL (ref 6.0–8.5)

## 2019-08-23 LAB — CBC
Hematocrit: 40.7 % (ref 37.5–51.0)
Hemoglobin: 14.2 g/dL (ref 13.0–17.7)
MCH: 33.4 pg — ABNORMAL HIGH (ref 26.6–33.0)
MCHC: 34.9 g/dL (ref 31.5–35.7)
MCV: 96 fL (ref 79–97)
Platelets: 280 10*3/uL (ref 150–450)
RBC: 4.25 x10E6/uL (ref 4.14–5.80)
RDW: 12.4 % (ref 11.6–15.4)
WBC: 7.6 10*3/uL (ref 3.4–10.8)

## 2019-08-23 LAB — HIV ANTIBODY (ROUTINE TESTING W REFLEX): HIV Screen 4th Generation wRfx: NONREACTIVE

## 2019-08-23 LAB — HEPATITIS C ANTIBODY: Hep C Virus Ab: <0.1 s/co ratio (ref 0.0–0.9)

## 2019-08-23 LAB — BRAIN NATRIURETIC PEPTIDE: BNP: 45.5 pg/mL (ref 0.0–100.0)

## 2019-09-18 ENCOUNTER — Other Ambulatory Visit: Payer: Self-pay | Admitting: Family Medicine

## 2019-09-18 DIAGNOSIS — F17200 Nicotine dependence, unspecified, uncomplicated: Secondary | ICD-10-CM

## 2019-09-18 MED FILL — LISINOPRIL 20 MG TABLET: 20 | 30 days supply | Qty: 30 | Fill #1

## 2019-09-18 MED FILL — METHOCARBAMOL 500 MG TABS: 500 | 15 days supply | Qty: 60 | Fill #1

## 2019-09-18 MED FILL — DICLOFENAC SODIUM 1 % GEL: 1 | 12 days supply | Qty: 100 | Fill #1

## 2019-09-18 MED FILL — AMLODIPINE BESYLATE 10 MG T: 10 | 30 days supply | Qty: 30 | Fill #5

## 2019-09-20 MED FILL — ALBUTEROL SULFATE HFA 108 (: 108 (90 BAS | 25 days supply | Qty: 18 | Fill #0

## 2019-09-23 ENCOUNTER — Ambulatory Visit: Payer: Self-pay | Admitting: Nurse Practitioner

## 2019-10-17 MED FILL — LISINOPRIL 20 MG TABLET: 20 | 30 days supply | Qty: 30 | Fill #2

## 2019-10-17 MED FILL — AMLODIPINE BESYLATE 10 MG T: 10 | 30 days supply | Qty: 30 | Fill #6

## 2019-10-17 MED FILL — !VENTOLIN HFA INHALER: 108 (90 BAS | 25 days supply | Qty: 18 | Fill #1

## 2019-11-12 ENCOUNTER — Ambulatory Visit: Payer: Self-pay | Attending: Nurse Practitioner | Admitting: Nurse Practitioner

## 2019-11-12 ENCOUNTER — Other Ambulatory Visit: Payer: Self-pay

## 2019-11-12 ENCOUNTER — Encounter: Payer: Self-pay | Admitting: Nurse Practitioner

## 2019-11-12 ENCOUNTER — Other Ambulatory Visit: Payer: Self-pay | Admitting: Nurse Practitioner

## 2019-11-12 ENCOUNTER — Other Ambulatory Visit: Payer: Self-pay | Admitting: Family Medicine

## 2019-11-12 VITALS — BP 142/85 | HR 96 | Temp 97.7°F | Wt 219.4 lb

## 2019-11-12 DIAGNOSIS — J449 Chronic obstructive pulmonary disease, unspecified: Secondary | ICD-10-CM

## 2019-11-12 DIAGNOSIS — R600 Localized edema: Secondary | ICD-10-CM

## 2019-11-12 DIAGNOSIS — I10 Essential (primary) hypertension: Secondary | ICD-10-CM

## 2019-11-12 DIAGNOSIS — F17200 Nicotine dependence, unspecified, uncomplicated: Secondary | ICD-10-CM

## 2019-11-12 MED ORDER — BREO ELLIPTA 200-25 MCG/INH IN AEPB: 1.0000 | INHALATION_SPRAY | Freq: Every day | RESPIRATORY_TRACT | 1 refills | Status: DC

## 2019-11-12 MED ORDER — MELOXICAM 7.5 MG PO TABS: 7.5000 mg | ORAL_TABLET | Freq: Every day | ORAL | 3 refills | Status: DC

## 2019-11-12 MED ORDER — FUROSEMIDE 20 MG PO TABS: 20.0000 mg | ORAL_TABLET | Freq: Every day | ORAL | 3 refills | Status: DC | PRN

## 2019-11-12 MED FILL — MELOXICAM 7.5 MG TABLET: 7.5 | 30 days supply | Qty: 30 | Fill #0

## 2019-11-12 MED FILL — ?FUROSEMIDE 20MG TABLET: 20 | 30 days supply | Qty: 30 | Fill #0

## 2019-11-12 MED FILL — AMLODIPINE BESYLATE 10 MG T: 10 | 30 days supply | Qty: 30 | Fill #7

## 2019-11-12 MED FILL — $VENTOLIN HFA 18G INHALER: 108 (90 BAS | 25 days supply | Qty: 18 | Fill #0

## 2019-11-12 MED FILL — !BREO ELLIPTA 200-25 MCG: 200-25 | 30 days supply | Qty: 30 | Fill #0

## 2019-11-12 MED FILL — LISINOPRIL 20 MG TABLET: 20 | 30 days supply | Qty: 30 | Fill #0

## 2019-11-12 NOTE — Progress Notes (Signed)
Assessment & Plan:  Luis Marquez was seen today for follow-up.  Diagnoses and all orders for this visit:  Essential hypertension Continue all antihypertensives as prescribed.  Remember to bring in your blood pressure log with you for your follow up appointment.  DASH/Mediterranean Diets are healthier choices for HTN.   Edema of both lower extremities -     furosemide (LASIX) 20 MG tablet; Take 1 tablet (20 mg total) by mouth daily as needed. -     CMP14+EGFR; Future DASH DIET  Tobacco dependence -     fluticasone furoate-vilanterol (BREO ELLIPTA) 200-25 MCG/INH AEPB; Inhale 1 puff into the lungs daily. NEEDS PASS Luis Marquez was counseled on the dangers of tobacco use, and was advised to quit. Reviewed strategies to maximize success, including removing cigarettes and smoking materials from environment, stress management and support of family/friends as well as pharmacological alternatives including: Wellbutrin, Chantix, Nicotine patch, Nicotine gum or lozenges. Smoking cessation support: smoking cessation hotline: 1-800-QUIT-NOW.  Smoking cessation classes are also available through [facility]. Call [phone] or visit our website at [email or web address]   A total of 3 minutes was spent on counseling for smoking cessation and Luis Marquez is not ready to quit.    Patient has been counseled on age-appropriate routine health concerns for screening and prevention. These are reviewed and up-to-date. Referrals have been placed accordingly. Immunizations are up-to-date or declined.    Subjective:   Chief Complaint  Patient presents with  . Follow-up    Pt. is here for 3 montns f.u. Pt. stated both of his feet are swollen. Pt. request medication refills.    [name] 59 y.o. male presents to office today for follow up.   Essential Hypertension Taking amlodipine 10 mg and lisinopril 20 mg daily. He does not monitor his blood pressure at home. Denies chest pain, shortness  of breath, palpitations, lightheadedness, dizziness, headaches. He does endorse BLE edema. Will start lasix 20 mg prn today.  BP Readings from Last 3 Encounters:  11/12/19 (!) 142/85  06/27/19 (!) 158/78  05/21/19 (!) 161/72     COPD Continues to smoke. Using albuterol inhaler 2-3 times per day due to shortness of breath. Started on BREO today.     Review of Systems  Constitutional: Negative for fever, malaise/fatigue and weight loss.  HENT: Negative.  Negative for nosebleeds.   Eyes: Negative.  Negative for blurred vision, double vision and photophobia.  Respiratory: Positive for shortness of breath. Negative for cough and wheezing.   Cardiovascular: Positive for leg swelling. Negative for chest pain and palpitations.  Gastrointestinal: Negative.  Negative for heartburn, nausea and vomiting.  Musculoskeletal: Negative.  Negative for myalgias.  Neurological: Negative.  Negative for dizziness, focal weakness, seizures and headaches.  Psychiatric/Behavioral: Negative.  Negative for suicidal ideas.    Past Medical History:  Diagnosis Date  . Hypertension     Past Surgical History:  Procedure Laterality Date  . HERNIA REPAIR    . INTRAMEDULLARY (IM) NAIL INTERTROCHANTERIC Right 11/11/2014   Procedure: INTRAMEDULLARY (IM) NAIL INTERTROCHANTRIC;  Surgeon: Paralee Cancel, MD;  Location: [facility];  Service: Orthopedics;  Laterality: Right;  . Rod right leg      Family History  Problem Relation Age of Onset  . Diabetes Mother     Social History Reviewed with no changes to be made today.   Outpatient Medications Prior to Visit  Medication Sig Dispense Refill  . amLODipine (NORVASC) 10 MG tablet Take 1 tablet (10 mg total) by  mouth daily. 90 tablet 3  . ferrous sulfate 325 (65 FE) MG tablet Take 1 tablet (325 mg total) by mouth 3 (three) times daily after meals.  3  . GLUCOS-CHONDROIT-MSM-C-HYAL PO Take 1 tablet by mouth daily.    . Multiple Vitamin (MULTIVITAMIN WITH MINERALS) TABS  tablet Take 1 tablet by mouth daily.    Marland Kitchen lisinopril (ZESTRIL) 20 MG tablet Take 1 tablet (20 mg total) by mouth daily. 90 tablet 0  . VENTOLIN HFA 108 (90 Base) MCG/ACT inhaler INHALE 2 PUFFS INTO THE LUNGS EVERY 6 (SIX) HOURS AS NEEDED FOR WHEEZING OR SHORTNESS OF BREATH (COUGH). 18 g 1  . polyethylene glycol (MIRALAX / GLYCOLAX) packet Take 17 g by mouth 2 (two) times daily. (Patient not taking: Reported on 04/22/2019) 14 each 0  . docusate sodium (COLACE) 100 MG capsule Take 1 capsule (100 mg total) by mouth 2 (two) times daily. (Patient not taking: Reported on 04/22/2019) 10 capsule 0  . methocarbamol (ROBAXIN) 500 MG tablet Take 1 tablet (500 mg total) by mouth every 6 (six) hours as needed for muscle spasms. (Patient not taking: Reported on 11/12/2019) 60 tablet 1   No facility-administered medications prior to visit.    No Known Allergies     Objective:    BP (!) 142/85 (BP Location: Right Arm, Patient Position: Sitting, Cuff Size: Normal)   Pulse 96   Temp 97.7 F (36.5 C) (Temporal)   Wt 219 lb 6.4 oz (99.5 kg)   SpO2 95%   BMI 29.76 kg/m  Wt Readings from Last 3 Encounters:  11/12/19 219 lb 6.4 oz (99.5 kg)  04/22/19 207 lb (93.9 kg)  11/12/14 175 lb (79.4 kg)    Physical Exam Vitals and nursing note reviewed.  Constitutional:      Appearance: He is well-developed.  HENT:     Head: Normocephalic and atraumatic.  Cardiovascular:     Rate and Rhythm: Normal rate and regular rhythm.     Heart sounds: Normal heart sounds. No murmur. No friction rub. No gallop.   Pulmonary:     Effort: Pulmonary effort is normal. No tachypnea or respiratory distress.     Breath sounds: Examination of the right-upper field reveals wheezing. Examination of the left-upper field reveals wheezing. Examination of the right-middle field reveals wheezing. Examination of the left-middle field reveals wheezing. Examination of the right-lower field reveals wheezing. Examination of the left-lower field  reveals wheezing. Wheezing present. No decreased breath sounds, rhonchi or rales.  Chest:     Chest wall: No tenderness.  Abdominal:     General: Bowel sounds are normal.     Palpations: Abdomen is soft.  Musculoskeletal:        General: Normal range of motion.     Cervical back: Normal range of motion.     Right lower leg: Edema present.     Left lower leg: Edema present.     Comments: 1+ pitting edema  Skin:    General: Skin is warm and dry.  Neurological:     Mental Status: He is alert and oriented to person, place, and time.     Coordination: Coordination normal.  Psychiatric:        Behavior: Behavior normal. Behavior is cooperative.        Thought Content: Thought content normal.        Judgment: Judgment normal.          Patient has been counseled extensively about nutrition and exercise as well as the  importance of adherence with medications and regular follow-up. The patient was given clear instructions to go to ER or return to medical center if symptoms don't improve, worsen or new problems develop. The patient verbalized understanding.   Follow-up: Return in about 3 weeks (around 12/03/2019) for 3 weeks LABS/CMP.   Luis Pounds, FNP-BC [facility] [place], [place]   11/14/2019, 6:25 PM

## 2019-11-14 ENCOUNTER — Encounter: Payer: Self-pay | Admitting: Nurse Practitioner

## 2019-12-03 ENCOUNTER — Other Ambulatory Visit: Payer: Self-pay

## 2019-12-03 ENCOUNTER — Ambulatory Visit: Payer: Self-pay | Attending: Nurse Practitioner

## 2019-12-03 DIAGNOSIS — R600 Localized edema: Secondary | ICD-10-CM

## 2019-12-04 ENCOUNTER — Other Ambulatory Visit: Payer: Self-pay | Admitting: Nurse Practitioner

## 2019-12-04 DIAGNOSIS — R600 Localized edema: Secondary | ICD-10-CM

## 2019-12-04 DIAGNOSIS — J449 Chronic obstructive pulmonary disease, unspecified: Secondary | ICD-10-CM

## 2019-12-04 LAB — CMP14+EGFR
ALT: 18 IU/L (ref 0–44)
AST: 26 IU/L (ref 0–40)
Albumin/Globulin Ratio: 1.7 (ref 1.2–2.2)
Albumin: 4.5 g/dL (ref 3.8–4.8)
Alkaline Phosphatase: 95 IU/L (ref 48–121)
BUN/Creatinine Ratio: 12 (ref 10–24)
BUN: 9 mg/dL (ref 8–27)
Bilirubin Total: 0.4 mg/dL (ref 0.0–1.2)
CO2: 26 mmol/L (ref 20–29)
Calcium: 9.8 mg/dL (ref 8.6–10.2)
Chloride: 96 mmol/L (ref 96–106)
Creatinine, Ser: 0.76 mg/dL (ref 0.76–1.27)
GFR calc Af Amer: 114 mL/min/{1.73_m2} (ref >59)
GFR calc non Af Amer: 98 mL/min/{1.73_m2} (ref >59)
Globulin, Total: 2.7 g/dL (ref 1.5–4.5)
Glucose: 104 mg/dL — ABNORMAL HIGH (ref 65–99)
Potassium: 4.3 mmol/L (ref 3.5–5.2)
Sodium: 138 mmol/L (ref 134–144)
Total Protein: 7.2 g/dL (ref 6.0–8.5)

## 2019-12-04 MED ORDER — FUROSEMIDE 20 MG PO TABS: 20.0000 mg | ORAL_TABLET | Freq: Every day | ORAL | 3 refills | Status: DC | PRN

## 2019-12-04 MED ORDER — MELOXICAM 7.5 MG PO TABS: 7.5000 mg | ORAL_TABLET | Freq: Every day | ORAL | 3 refills | Status: AC

## 2019-12-04 MED ORDER — BREO ELLIPTA 200-25 MCG/INH IN AEPB: 1.0000 | INHALATION_SPRAY | Freq: Every day | RESPIRATORY_TRACT | 1 refills | Status: DC

## 2019-12-09 MED FILL — ?FUROSEMIDE 20MG TABLET: 20 | 30 days supply | Qty: 30 | Fill #1

## 2019-12-09 MED FILL — ?AMLODIPINE BESYL 10MG TABL: 10 | 30 days supply | Qty: 30 | Fill #8

## 2019-12-09 MED FILL — MELOXICAM 7.5 MG TABLET: 7.5 | 30 days supply | Qty: 30 | Fill #1

## 2019-12-09 MED FILL — LISINOPRIL 20 MG TABLET: 20 | 30 days supply | Qty: 30 | Fill #1

## 2019-12-09 MED FILL — !VENTOLIN HFA INHALER: 108 (90 BAS | 25 days supply | Qty: 18 | Fill #1

## 2019-12-09 MED FILL — !BREO ELLIPTA 200-25 MCG: 200-25 | 30 days supply | Qty: 30 | Fill #1

## 2019-12-11 ENCOUNTER — Ambulatory Visit: Payer: Self-pay

## 2020-01-07 ENCOUNTER — Other Ambulatory Visit: Payer: Self-pay | Admitting: Nurse Practitioner

## 2020-01-07 DIAGNOSIS — F17200 Nicotine dependence, unspecified, uncomplicated: Secondary | ICD-10-CM

## 2020-01-07 MED FILL — ?AMLODIPINE BESYL 10MG TABL: 10 | 30 days supply | Qty: 30 | Fill #9

## 2020-01-07 MED FILL — LISINOPRIL 20 MG TABLET: 20 | 30 days supply | Qty: 30 | Fill #2

## 2020-01-07 MED FILL — !BREO ELLIPTA 200-25 MCG: 200-25 | 30 days supply | Qty: 30 | Fill #2

## 2020-01-08 MED FILL — $VENTOLIN HFA 18G INHALER: 108 (90 BAS | 25 days supply | Qty: 18 | Fill #0

## 2020-01-10 ENCOUNTER — Other Ambulatory Visit: Payer: Self-pay

## 2020-01-10 ENCOUNTER — Ambulatory Visit: Payer: Self-pay | Attending: Nurse Practitioner

## 2020-01-15 ENCOUNTER — Other Ambulatory Visit: Payer: Self-pay

## 2020-01-15 ENCOUNTER — Ambulatory Visit: Payer: Self-pay | Attending: Nurse Practitioner

## 2020-02-04 ENCOUNTER — Ambulatory Visit: Payer: Self-pay | Admitting: Nurse Practitioner

## 2020-02-05 MED FILL — $BREO ELLIPTA 200-25 MCG IN: 200-25 | 90 days supply | Qty: 180 | Fill #0

## 2020-02-17 MED FILL — $VENTOLIN HFA 18G INHALER: 108 (90 BAS | 25 days supply | Qty: 18 | Fill #1

## 2020-02-17 MED FILL — LISINOPRIL 20 MG TABLET: 20 | 30 days supply | Qty: 30 | Fill #3

## 2020-02-17 MED FILL — AMLODIPINE BESYLATE 10 MG T: 10 | 30 days supply | Qty: 30 | Fill #10

## 2020-02-26 ENCOUNTER — Other Ambulatory Visit: Payer: Self-pay | Admitting: Nurse Practitioner

## 2020-02-26 ENCOUNTER — Encounter: Payer: Self-pay | Admitting: Nurse Practitioner

## 2020-02-26 ENCOUNTER — Other Ambulatory Visit: Payer: Self-pay

## 2020-02-26 ENCOUNTER — Ambulatory Visit: Payer: Self-pay | Attending: Nurse Practitioner | Admitting: Nurse Practitioner

## 2020-02-26 DIAGNOSIS — I10 Essential (primary) hypertension: Secondary | ICD-10-CM

## 2020-02-26 DIAGNOSIS — R600 Localized edema: Secondary | ICD-10-CM

## 2020-02-26 DIAGNOSIS — G8929 Other chronic pain: Secondary | ICD-10-CM

## 2020-02-26 DIAGNOSIS — M25511 Pain in right shoulder: Secondary | ICD-10-CM

## 2020-02-26 MED ORDER — LISINOPRIL 20 MG PO TABS: 20.0000 mg | ORAL_TABLET | Freq: Every day | ORAL | 1 refills | Status: DC

## 2020-02-26 MED ORDER — FUROSEMIDE 20 MG PO TABS: 20.0000 mg | ORAL_TABLET | Freq: Every day | ORAL | 3 refills | Status: DC | PRN

## 2020-02-26 MED ORDER — AMLODIPINE BESYLATE 10 MG PO TABS: 10.0000 mg | ORAL_TABLET | Freq: Every day | ORAL | 3 refills | Status: DC

## 2020-02-26 MED FILL — ?FUROSEMIDE 20MG TABLET: 20 | 30 days supply | Qty: 30 | Fill #0

## 2020-02-26 NOTE — Progress Notes (Signed)
Virtual Visit via Telephone Note Due to national recommendations of social distancing due to COVID 19, telehealth visit is felt to be most appropriate for this patient at this time.  I discussed the limitations, risks, security and privacy concerns of performing an evaluation and management service by telephone and the availability of in person appointments. I also discussed with the patient that there may be a patient responsible charge related to this service. The patient expressed understanding and agreed to proceed.    I connected with Vikki Ports on 02/26/20  at   3:10 PM EDT  EDT by telephone and verified that I am speaking with the correct person using two identifiers.   Consent I discussed the limitations, risks, security and privacy concerns of performing an evaluation and management service by telephone and the availability of in person appointments. I also discussed with the patient that there may be a patient responsible charge related to this service. The patient expressed understanding and agreed to proceed.   Location of Patient: Private Residence    Location of Provider: [facility]    Persons participating in Telemedicine visit: Bertram Denver FNP-BC [facility] CMA Vikki Ports    History of Present Illness: Telemedicine visit for: Follow Up  Right Shoulder Pain Onset 7 months ago. Not related to injury or trauma. Has difficulty lifting arm midaxillary area. Has to use left arm to lift right arm above shoulder. Associated Symptoms: popping sensation. 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 Relieving factors: rest.   HTN  Recent home readings: 150/70. He does continue to smoke. Denies chest pain, shortness of breath, palpitations, lightheadedness, dizziness, headaches or BLE edema. Endorses medication adherence taking amlodipine 10 mg daily and lisinopril 20mg  daily however lisinopril is  showing as expired on 02-10-2020.  BP Readings from Last 3 Encounters:  11/12/19 (!) 142/85  06/27/19 (!) 158/78  05/21/19 (!) 161/72     Past Medical History:  Diagnosis Date  . Hypertension     Past Surgical History:  Procedure Laterality Date  . HERNIA REPAIR    . INTRAMEDULLARY (IM) NAIL INTERTROCHANTERIC Right 11/11/2014   Procedure: INTRAMEDULLARY (IM) NAIL INTERTROCHANTRIC;  Surgeon: 11/13/2014, MD;  Location: [facility];  Service: Orthopedics;  Laterality: Right;  . Rod right leg      Family History  Problem Relation Age of Onset  . Diabetes Mother     Social History   Socioeconomic History  . Marital status: Single    Spouse name: Not on file  . Number of children: Not on file  . Years of education: Not on file  . Highest education level: Not on file  Occupational History  . Not on file  Tobacco Use  . Smoking status: Current Every Day Smoker    Packs/day: 0.25    Types: Cigarettes  . Smokeless tobacco: Never Used  Substance and Sexual Activity  . Alcohol use: Not Currently  . Drug use: Not Currently  . Sexual activity: Yes  Other Topics Concern  . Not on file  Social History Narrative   ** Merged History Encounter **       Social Determinants of Health   Financial Resource Strain:   . Difficulty of Paying Living Expenses:   Food Insecurity:   . Worried About Durene Romans in the Last Year:   . Programme researcher, broadcasting/film/video in the Last Year:   Transportation Needs:   .  Lack of Transportation (Medical):   Marland Kitchen Lack of Transportation (Non-Medical):   Physical Activity:   . Days of Exercise per Week:   . Minutes of Exercise per Session:   Stress:   . Feeling of Stress :   Social Connections:   . Frequency of Communication with Friends and Family:   . Frequency of Social Gatherings with Friends and Family:   . Attends Religious Services:   . Active Member of Clubs or Organizations:   . Attends Banker Meetings:   Marland Kitchen Marital Status:       Observations/Objective: Awake, alert and oriented x 3   Review of Systems  Constitutional: Negative for fever, malaise/fatigue and weight loss.  HENT: Negative.  Negative for nosebleeds.   Eyes: Negative.  Negative for blurred vision, double vision and photophobia.  Respiratory: Negative.  Negative for cough and shortness of breath.   Cardiovascular: Negative.  Negative for chest pain, palpitations and leg swelling.  Gastrointestinal: Negative.  Negative for heartburn, nausea and vomiting.  Musculoskeletal: Positive for joint pain. Negative for myalgias.  Neurological: Negative.  Negative for dizziness, focal weakness, seizures and headaches.  Psychiatric/Behavioral: Negative.  Negative for suicidal ideas.    Assessment and Plan: Byard was seen today for shoulder pain.  Diagnoses and all orders for this visit:  Chronic right shoulder pain -     Ambulatory referral to Orthopedic Surgery -     DG Shoulder Right; Future  Essential hypertension -     lisinopril (ZESTRIL) 20 MG tablet; Take 1 tablet (20 mg total) by mouth daily. -     amLODipine (NORVASC) 10 MG tablet; Take 1 tablet (10 mg total) by mouth daily. Continue all antihypertensives as prescribed.  Remember to bring in your blood pressure log with you for your follow up appointment.  DASH/Mediterranean Diets are healthier choices for HTN.    Edema of both lower extremities -     furosemide (LASIX) 20 MG tablet; Take 1 tablet (20 mg total) by mouth daily as needed.     Follow Up Instructions Return in about 3 months (around 05/28/2020).     I discussed the assessment and treatment plan with the patient. The patient was provided an opportunity to ask questions and all were answered. The patient agreed with the plan and demonstrated an understanding of the instructions.   The patient was advised to call back or seek an in-person evaluation if the symptoms worsen or if the condition fails to improve as anticipated.  I  provided 18 minutes of non-face-to-face time during this encounter including median intraservice time, reviewing previous notes, labs, imaging, medications and explaining diagnosis and management.  Claiborne Rigg, FNP-BC

## 2020-02-28 ENCOUNTER — Other Ambulatory Visit: Payer: Self-pay

## 2020-02-28 ENCOUNTER — Ambulatory Visit (HOSPITAL_COMMUNITY)
Admission: RE | Admit: 2020-02-28 | Discharge: 2020-02-28 | Disposition: A | Discharge status: Home / Self Care | Payer: Self-pay | Source: Ambulatory Visit | Attending: Nurse Practitioner | Admitting: Nurse Practitioner

## 2020-02-28 DIAGNOSIS — G8929 Other chronic pain: Secondary | ICD-10-CM | POA: Insufficient documentation

## 2020-02-28 DIAGNOSIS — M25511 Pain in right shoulder: Secondary | ICD-10-CM | POA: Insufficient documentation

## 2020-03-04 ENCOUNTER — Other Ambulatory Visit: Payer: Self-pay | Admitting: Nurse Practitioner

## 2020-03-04 ENCOUNTER — Other Ambulatory Visit: Payer: Self-pay

## 2020-03-04 ENCOUNTER — Ambulatory Visit: Payer: Self-pay | Attending: Family Medicine

## 2020-03-04 DIAGNOSIS — E7800 Pure hypercholesterolemia, unspecified: Secondary | ICD-10-CM

## 2020-03-04 DIAGNOSIS — I10 Essential (primary) hypertension: Secondary | ICD-10-CM

## 2020-03-04 DIAGNOSIS — Z131 Encounter for screening for diabetes mellitus: Secondary | ICD-10-CM

## 2020-03-04 DIAGNOSIS — D649 Anemia, unspecified: Secondary | ICD-10-CM

## 2020-03-05 ENCOUNTER — Encounter: Payer: Self-pay | Admitting: Family Medicine

## 2020-03-05 ENCOUNTER — Ambulatory Visit (INDEPENDENT_AMBULATORY_CARE_PROVIDER_SITE_OTHER): Payer: Self-pay | Admitting: Family Medicine

## 2020-03-05 ENCOUNTER — Other Ambulatory Visit: Payer: Self-pay | Admitting: Nurse Practitioner

## 2020-03-05 DIAGNOSIS — G8929 Other chronic pain: Secondary | ICD-10-CM

## 2020-03-05 DIAGNOSIS — Z1211 Encounter for screening for malignant neoplasm of colon: Secondary | ICD-10-CM

## 2020-03-05 DIAGNOSIS — M25511 Pain in right shoulder: Secondary | ICD-10-CM

## 2020-03-05 LAB — CMP14+EGFR
ALT: 14 IU/L (ref 0–44)
AST: 25 IU/L (ref 0–40)
Albumin/Globulin Ratio: 1.9 (ref 1.2–2.2)
Albumin: 4.2 g/dL (ref 3.8–4.8)
Alkaline Phosphatase: 98 IU/L (ref 48–121)
BUN/Creatinine Ratio: 10 (ref 10–24)
BUN: 7 mg/dL — ABNORMAL LOW (ref 8–27)
Bilirubin Total: 0.8 mg/dL (ref 0.0–1.2)
CO2: 28 mmol/L (ref 20–29)
Calcium: 8.9 mg/dL (ref 8.6–10.2)
Chloride: 99 mmol/L (ref 96–106)
Creatinine, Ser: 0.7 mg/dL — ABNORMAL LOW (ref 0.76–1.27)
GFR calc Af Amer: 118 mL/min/{1.73_m2} (ref >59)
GFR calc non Af Amer: 102 mL/min/{1.73_m2} (ref >59)
Globulin, Total: 2.2 g/dL (ref 1.5–4.5)
Glucose: 83 mg/dL (ref 65–99)
Potassium: 3.3 mmol/L — ABNORMAL LOW (ref 3.5–5.2)
Sodium: 142 mmol/L (ref 134–144)
Total Protein: 6.4 g/dL (ref 6.0–8.5)

## 2020-03-05 LAB — CBC
Hematocrit: 38.7 % (ref 37.5–51.0)
Hemoglobin: 13.7 g/dL (ref 13.0–17.7)
MCH: 36.1 pg — ABNORMAL HIGH (ref 26.6–33.0)
MCHC: 35.4 g/dL (ref 31.5–35.7)
MCV: 102 fL — ABNORMAL HIGH (ref 79–97)
Platelets: 254 10*3/uL (ref 150–450)
RBC: 3.79 x10E6/uL — ABNORMAL LOW (ref 4.14–5.80)
RDW: 14.5 % (ref 11.6–15.4)
WBC: 6.4 10*3/uL (ref 3.4–10.8)

## 2020-03-05 LAB — LIPID PANEL
Chol/HDL Ratio: 2 ratio (ref 0.0–5.0)
Cholesterol, Total: 196 mg/dL (ref 100–199)
HDL: 96 mg/dL (ref >39)
LDL Chol Calc (NIH): 86 mg/dL (ref 0–99)
Triglycerides: 81 mg/dL (ref 0–149)
VLDL Cholesterol Cal: 14 mg/dL (ref 5–40)

## 2020-03-05 LAB — HEMOGLOBIN A1C
Est. average glucose Bld gHb Est-mCnc: 108 mg/dL
Hgb A1c MFr Bld: 5.4 % (ref 4.8–5.6)

## 2020-03-05 MED ORDER — MELOXICAM 15 MG PO TABS: 7.5000 mg | ORAL_TABLET | Freq: Every day | ORAL | 6 refills | Status: DC | PRN

## 2020-03-05 MED ORDER — NABUMETONE 750 MG PO TABS
750.0000 mg | ORAL_TABLET | Freq: Two times a day (BID) | ORAL | 6 refills | Status: DC | PRN
Start: 2020-03-05 — End: 2020-09-14

## 2020-03-05 MED ORDER — FERROUS SULFATE 325 (65 FE) MG PO TABS: 325.0000 mg | ORAL_TABLET | Freq: Two times a day (BID) | ORAL | 3 refills | Status: DC

## 2020-03-05 MED FILL — NABUMETONE 750 MG TABS: 750 | 30 days supply | Qty: 60 | Fill #0

## 2020-03-05 NOTE — Progress Notes (Signed)
   Office Visit Note   Patient: Luis Marquez           Date of Birth: [birth year]           MRN: [id number] Visit Date: 03/05/2020 Requested by: Claiborne Rigg, NP [address] [place],  [place] [address] PCP: Claiborne Rigg, NP  Subjective: Chief Complaint  Patient presents with  . Right Shoulder - Pain    HPI: He is here with right shoulder pain.  He is right-hand dominant.  Symptoms started about 6 months ago, he recalls falling at 1 point but he did not feel pain right away.  After a while he started having pain on the anterior lateral aspect of his shoulder and now he has difficulty raising his arm overhead.  He denies any previous problems with his shoulder.  He is try meloxicam and ibuprofen with no relief.  Pain sometimes keeps him from sleeping well at night.              ROS:   All other systems were reviewed and are negative.  Objective: Vital Signs: There were no vitals taken for this visit.  Physical Exam:  General:  Alert and oriented, in no acute distress. Pulm:  Breathing unlabored. Psy:  Normal mood, congruent affect. Skin: No bruising Right shoulder: He does not have a Popeye deformity.  Full passive range of motion of the shoulder but he has difficulty actively reaching overhead.  No tenderness at the [facility] joint.  Mild tenderness in the lateral subacromial space.  He has pain with empty can test and with external rotation, with slight weakness in both.  Imaging: Recent x-rays viewed on computer show no significant bony abnormality.   Assessment & Plan: 1.  Chronic right shoulder pain concerning for partial rotator cuff tear -Discussed options with him, he wants to try physical therapy.  Relafen to take as needed.  If symptoms persist, we will do a one-time subacromial injection.  If that does not help, then MRI scan.     Procedures: No procedures performed  No notes on file     PMFS History: Patient Active Problem List   Diagnosis Date Noted  .  Intertrochanteric fracture of right femur (HCC) 11/11/2014  . Fracture, intertrochanteric, right femur (HCC) 11/11/2014  . Anemia 11/11/2014   Past Medical History:  Diagnosis Date  . Hypertension     Family History  Problem Relation Age of Onset  . Diabetes Mother     Past Surgical History:  Procedure Laterality Date  . HERNIA REPAIR    . INTRAMEDULLARY (IM) NAIL INTERTROCHANTERIC Right 11/11/2014   Procedure: INTRAMEDULLARY (IM) NAIL INTERTROCHANTRIC;  Surgeon: Durene Romans, MD;  Location: [facility];  Service: Orthopedics;  Laterality: Right;  . Rod right leg     Social History   Occupational History  . Not on file  Tobacco Use  . Smoking status: Current Every Day Smoker    Packs/day: 0.25    Types: Cigarettes  . Smokeless tobacco: Never Used  Substance and Sexual Activity  . Alcohol use: Not Currently  . Drug use: Not Currently  . Sexual activity: Yes

## 2020-03-05 NOTE — Progress Notes (Signed)
Pain for last 6 months No known injury  Pain is getting worse No numbness or tingling  Tried muscle relaxer, aleve and ibuprofen with no relief

## 2020-03-06 MED FILL — FERROUS SULFATE 325 MG TAB: 325 (65 FE) | 30 days supply | Qty: 60 | Fill #0

## 2020-03-18 ENCOUNTER — Ambulatory Visit: Payer: Self-pay | Attending: Family Medicine

## 2020-03-18 ENCOUNTER — Other Ambulatory Visit: Payer: Self-pay | Admitting: Nurse Practitioner

## 2020-03-18 ENCOUNTER — Other Ambulatory Visit: Payer: Self-pay

## 2020-03-18 ENCOUNTER — Encounter: Payer: Self-pay | Admitting: Rehabilitative and Restorative Service Providers"

## 2020-03-18 ENCOUNTER — Ambulatory Visit (INDEPENDENT_AMBULATORY_CARE_PROVIDER_SITE_OTHER): Payer: Self-pay | Admitting: Rehabilitative and Restorative Service Providers"

## 2020-03-18 DIAGNOSIS — E876 Hypokalemia: Secondary | ICD-10-CM

## 2020-03-18 DIAGNOSIS — R293 Abnormal posture: Secondary | ICD-10-CM

## 2020-03-18 DIAGNOSIS — F17200 Nicotine dependence, unspecified, uncomplicated: Secondary | ICD-10-CM

## 2020-03-18 DIAGNOSIS — M6281 Muscle weakness (generalized): Secondary | ICD-10-CM

## 2020-03-18 DIAGNOSIS — M25511 Pain in right shoulder: Secondary | ICD-10-CM

## 2020-03-18 DIAGNOSIS — G8929 Other chronic pain: Secondary | ICD-10-CM

## 2020-03-18 MED FILL — LISINOPRIL 20 MG TABLET: 20 | 30 days supply | Qty: 30 | Fill #4

## 2020-03-18 MED FILL — AMLODIPINE BESYLATE 10 MG T: 10 | 30 days supply | Qty: 30 | Fill #11

## 2020-03-18 MED FILL — $VENTOLIN HFA 18G INHALER: 108 (90 BAS | 25 days supply | Qty: 18 | Fill #0

## 2020-03-18 NOTE — Therapy (Signed)
[facility] Physical Therapy [address] [place], [place], [address] Phone: [phone]   Fax:  [phone]  Physical Therapy Evaluation  Patient Details  Name: Luis Marquez MRN: [id number] Date of Birth: Apr 19, 1961 Referring Provider (PT): Dr. Prince Rome   Encounter Date: 03/18/2020   PT End of Session - 03/18/20 1603    Visit Number 1    Number of Visits 12    Date for PT Re-Evaluation 05/13/20    Progress Note Due on Visit 10    PT Start Time 1603    PT Stop Time 1640    PT Time Calculation (min) 37 min    Activity Tolerance Patient tolerated treatment well    Behavior During Therapy [facility] for tasks assessed/performed           Past Medical History:  Diagnosis Date   Hypertension     Past Surgical History:  Procedure Laterality Date   HERNIA REPAIR     INTRAMEDULLARY (IM) NAIL INTERTROCHANTERIC Right 11/11/2014   Procedure: INTRAMEDULLARY (IM) NAIL INTERTROCHANTRIC;  Surgeon: Durene Romans, MD;  Location: [facility];  Service: Orthopedics;  Laterality: Right;   Rod right leg      There were no vitals filed for this visit.    Subjective Assessment - 03/18/20 1606    Subjective Pt. stated insidous onset of symptoms about 6 months ago.  Pt. stated R hand dominant.    Pertinent History No previous history of injury to Rt shoulder indicated    Diagnostic tests xrays were negative    Patient Stated Goals Reduce pain    Currently in Pain? Yes    Pain Score 4    pain at worst 7/10.   Pain Location Shoulder    Pain Orientation Right    Pain Descriptors / Indicators Aching    Pain Type Chronic pain    Pain Onset More than a month ago    Pain Frequency Intermittent    Aggravating Factors  reaching out to side/behind back, lifting, difficulty sleeping    Effect of Pain on Daily Activities Gross Rt UE deficits/pain              [facility] PT Assessment - 03/18/20 0001      Assessment   Medical Diagnosis Chronic Rt shoulder pain    Referring Provider  (PT) Dr. Prince Rome    Onset Date/Surgical Date 09/16/19    Hand Dominance Right      Precautions   Precautions None      Restrictions   Weight Bearing Restrictions No      Balance Screen   Has the patient fallen in the past 6 months Yes    How many times? 1      Prior Function   Level of Independence Independent    Vocation Full time employment    Vocation Requirements Various tool use, gas equipment      Cognition   Overall Cognitive Status Within Functional Limits for tasks assessed      Sensation   Light Touch Appears Intact      Posture/Postural Control   Posture/Postural Control Postural limitations    Postural Limitations Forward head;Rounded Shoulders      ROM / Strength   AROM / PROM / Strength Strength;PROM;AROM      AROM   Overall AROM Comments Lt HBB T8, Rt HBB T7 c mild pain.  Shrug noted in elevation against gravity, movement in flexion Rt GH jt to approx. 80 deg, abd to 75 deg  AROM Assessment Site Shoulder    Right/Left Shoulder Left;Right    Right Shoulder Flexion 148 Degrees   measured supine   Right Shoulder ABduction 140 Degrees    Right Shoulder Internal Rotation 65 Degrees   measured in 45 deg abd supine   Right Shoulder External Rotation --   measured in 45 deg abd supine   Right Shoulder Horizontal ABduction 80 Degrees      PROM   PROM Assessment Site Shoulder    Right/Left Shoulder Left;Right      Strength   Overall Strength Comments Pain in Rt shoulder c flexion, abd, ER    Strength Assessment Site Forearm;Elbow;Shoulder    Right/Left Shoulder Left;Right    Right Shoulder Flexion 2+/5    Right Shoulder ABduction 2+/5    Right Shoulder Internal Rotation 4/5    Right Shoulder External Rotation 3+/5    Left Shoulder Flexion 4+/5    Left Shoulder ABduction 4+/5    Left Shoulder Internal Rotation 5/5    Left Shoulder External Rotation 5/5    Right/Left Elbow Left;Right      Palpation   Palpation comment TrP and concordant pain referred  from Rt infraspinatus, supraspinatus      Special Tests   Other special tests Rt + empty can SAT, painful arc, shrug sign.  - lift off, drop arm                      Objective measurements completed on examination: See above findings.       [facility] Adult PT Treatment/Exercise - 03/18/20 0001      Self-Care   Self-Care Other Self-Care Comments    Other Self-Care Comments  Instruction for post dry needling care including heat/ice, use of HEP, soreness expectations      Exercises   Exercises Shoulder;Other Exercises    Other Exercises  HEP instruction/performance per handout c cues for techniques.  Cross arm stretch 30 sec x 5, scap retraction 5 sec hold 2 x 10, supine wand flexion 3 x 10       Modalities   Modalities Moist Heat      Moist Heat Therapy   Number Minutes Moist Heat 5 Minutes   c self care instruction   Moist Heat Location Shoulder   rt     Manual Therapy   Manual therapy comments compression to Rt infraspinatus            Trigger Point Dry Needling - 03/18/20 0001    Consent Given? Yes    Education Handout Provided Yes    Muscles Treated Upper Quadrant Infraspinatus   Rt   Infraspinatus Response Twitch response elicited                PT Education - 03/18/20 1605    Education Details HEP, POC    Person(s) Educated Patient    Methods Explanation;Demonstration;Tactile cues;Handout    Comprehension Returned demonstration;Verbalized understanding            PT Short Term Goals - 03/18/20 1603      PT SHORT TERM GOAL #1   Title Patient will demonstrate independent use of home exercise program to maintain progress from in clinic treatments.    Time 2    Period Weeks    Status New    Target Date 04/01/20             PT Long Term Goals - 03/18/20 1603      PT LONG  TERM GOAL #1   Title Patient will demonstrate/report pain at worst less than or equal to 2/10 to facilitate minimal limitation in daily activity secondary to pain  symptoms.    Time 8    Period Weeks    Status New    Target Date 05/13/20      PT LONG TERM GOAL #2   Title Patient will demonstrate independent use of home exercise program to facilitate ability to maintain/progress functional gains from skilled physical therapy services.    Time 8    Period Weeks    Status New    Target Date 05/13/20      PT LONG TERM GOAL #3   Title Pt. will demonstrate Rt GH jt AROM WFL s symptoms to facilitate usual lifting, reaching, self care at [facility].    Time 8    Period Weeks    Status New    Target Date 05/13/20      PT LONG TERM GOAL #4   Title Patient will demonstrate Rt UE MMT equal to Lt throughout to facilitate usual lifting, carrying in functional activity to PLOF s limitation.    Time 8    Period Weeks    Status New    Target Date 05/13/20      PT LONG TERM GOAL #5   Title Pt. will demonstrate ability to perform work at [employer].    Time 8    Period Weeks    Status New    Target Date 05/13/20                  Plan - 03/18/20 1603    Clinical Impression Statement Patient is a  59 y.o. male who comes to clinic with complaints of Rt shoulder pain with mobility, strength and movement coordination deficits that impair their ability to perform usual daily and recreational functional activities without increase difficulty/symptoms at this time.  Patient to benefit from skilled PT services to address impairments and limitations to improve to previous level of function without restriction secondary to condition.    Personal Factors and Comorbidities Comorbidity 1    Comorbidities HTN    Examination-Activity Limitations Bathing;Carry;Dressing;Lift;Reach Overhead    Stability/Clinical Decision Making Stable/Uncomplicated    Clinical Decision Making Low    Rehab Potential Good    PT Frequency --   1-2x/week   PT Duration 8 weeks    PT Treatment/Interventions ADLs/Self Care Home Management;Cryotherapy;Electrical Stimulation;Iontophoresis 4mg /ml  Dexamethasone;Moist Heat;Traction;Balance training;Therapeutic exercise;Therapeutic activities;Functional mobility training;Stair training;Gait training;Ultrasound;Neuromuscular re-education;Patient/family education;Manual techniques;Taping;Vasopneumatic Device;Dry needling;Passive range of motion;Spinal Manipulations;Joint Manipulations    PT Next Visit Plan DN as appropriate, active mobility strengthening    PT Home Exercise Plan [email or web address]    Consulted and Agree with Plan of Care Patient           Patient will benefit from skilled therapeutic intervention in order to improve the following deficits and impairments:  Hypomobility, Decreased activity tolerance, Decreased strength, Impaired UE functional use, Pain, Increased muscle spasms, Decreased mobility, Decreased range of motion, Impaired perceived functional ability, Impaired flexibility, Decreased coordination  Visit Diagnosis: Chronic right shoulder pain  Muscle weakness (generalized)  Abnormal posture     Problem List Patient Active Problem List   Diagnosis Date Noted   Intertrochanteric fracture of right femur (HCC) 11/11/2014   Fracture, intertrochanteric, right femur (HCC) 11/11/2014   Anemia 11/11/2014    11/13/2014, PT, DPT, OCS, ATC 03/18/20  4:46 PM    [facility] Physical Therapy [address]  [place], [place], [address] Phone: [phone]   Fax:  [phone]  Name: Haji Galluccio MRN: [id number] Date of Birth: 04/20/1961

## 2020-03-18 NOTE — Patient Instructions (Signed)
Access Code: [email or web address] URL: [email or web address] Date: 03/18/2020 Prepared by: Chyrel Masson  Exercises Supine Cross Body Shoulder Stretch - 2 x daily - 7 x weekly - 1 sets - 5 reps - 30 hold Seated Scapular Retraction - 2 x daily - 7 x weekly - 10 reps - 2 sets - 5 hold Supine Shoulder Flexion Extension AAROM with Dowel - 2 x daily - 7 x weekly - 10 reps - 3 sets - 5 hold

## 2020-03-19 LAB — BASIC METABOLIC PANEL
BUN/Creatinine Ratio: 16 (ref 10–24)
BUN: 12 mg/dL (ref 8–27)
CO2: 29 mmol/L (ref 20–29)
Calcium: 9.4 mg/dL (ref 8.6–10.2)
Chloride: 96 mmol/L (ref 96–106)
Creatinine, Ser: 0.77 mg/dL (ref 0.76–1.27)
GFR calc Af Amer: 113 mL/min/{1.73_m2} (ref >59)
GFR calc non Af Amer: 98 mL/min/{1.73_m2} (ref >59)
Glucose: 93 mg/dL (ref 65–99)
Potassium: 3.6 mmol/L (ref 3.5–5.2)
Sodium: 140 mmol/L (ref 134–144)

## 2020-03-26 ENCOUNTER — Other Ambulatory Visit: Payer: Self-pay

## 2020-03-26 ENCOUNTER — Encounter: Payer: Self-pay | Admitting: Physical Therapy

## 2020-03-26 ENCOUNTER — Ambulatory Visit (INDEPENDENT_AMBULATORY_CARE_PROVIDER_SITE_OTHER): Payer: Self-pay | Admitting: Physical Therapy

## 2020-03-26 DIAGNOSIS — R293 Abnormal posture: Secondary | ICD-10-CM

## 2020-03-26 DIAGNOSIS — M25511 Pain in right shoulder: Secondary | ICD-10-CM

## 2020-03-26 DIAGNOSIS — M6281 Muscle weakness (generalized): Secondary | ICD-10-CM

## 2020-03-26 DIAGNOSIS — G8929 Other chronic pain: Secondary | ICD-10-CM

## 2020-03-26 NOTE — Therapy (Signed)
[facility] Physical Therapy [address] [place], [place], [address] Phone: [phone]   Fax:  [phone]  Physical Therapy Treatment  Patient Details  Name: Luis Marquez MRN: [id number] Date of Birth: October 10, 1960 Referring Provider (PT): Dr. Junius Roads   Encounter Date: 03/26/2020   PT End of Session - 03/26/20 0836    Visit Number 2    Number of Visits 12    Date for PT Re-Evaluation 05/13/20    Progress Note Due on Visit 10    PT Start Time 0806    PT Stop Time 0841    PT Time Calculation (min) 35 min    Activity Tolerance Patient tolerated treatment well    Behavior During Therapy [facility] for tasks assessed/performed           Past Medical History:  Diagnosis Date  . Hypertension     Past Surgical History:  Procedure Laterality Date  . HERNIA REPAIR    . INTRAMEDULLARY (IM) NAIL INTERTROCHANTERIC Right 11/11/2014   Procedure: INTRAMEDULLARY (IM) NAIL INTERTROCHANTRIC;  Surgeon: Paralee Cancel, MD;  Location: [facility];  Service: Orthopedics;  Laterality: Right;  . Rod right leg      There were no vitals filed for this visit.   Subjective Assessment - 03/26/20 0806    Subjective reports great improvement - felt the DN was helpful; reports pain with come occasionally.  feels motion is a little better but pain is improved.    Pertinent History No previous history of injury to Rt shoulder indicated    Diagnostic tests xrays were negative    Patient Stated Goals Reduce pain    Currently in Pain? No/denies                             [facility] Adult PT Treatment/Exercise - 03/26/20 0810      Shoulder Exercises: Standing   Row Both;20 reps;Theraband    Theraband Level (Shoulder Row) Level 3 (Green)    Other Standing Exercises wall ladder flexion x 20 reps; Rt      Shoulder Exercises: Pulleys   Flexion 2 minutes    Scaption 2 minutes      Manual Therapy   Manual therapy comments compression to Rt infraspinatus, STM with IASTM              Trigger Point Dry Needling - 03/26/20 0834    Consent Given? Yes    Education Handout Provided Previously provided    Muscles Treated Upper Quadrant Infraspinatus    Electrical Stimulation Performed with Dry Needling Yes    E-stim with Dry Needling Details infraspinatus x 5 min to tolerance    Infraspinatus Response Twitch response elicited                  PT Short Term Goals - 03/18/20 1603      PT SHORT TERM GOAL #1   Title Patient will demonstrate independent use of home exercise program to maintain progress from in clinic treatments.    Time 2    Period Weeks    Status New    Target Date 04/01/20             PT Long Term Goals - 03/18/20 1603      PT LONG TERM GOAL #1   Title Patient will demonstrate/report pain at worst less than or equal to 2/10 to facilitate minimal limitation in daily activity secondary to pain symptoms.    Time 8  Period Weeks    Status New    Target Date 05/13/20      PT LONG TERM GOAL #2   Title Patient will demonstrate independent use of home exercise program to facilitate ability to maintain/progress functional gains from skilled physical therapy services.    Time 8    Period Weeks    Status New    Target Date 05/13/20      PT LONG TERM GOAL #3   Title Pt. will demonstrate Rt GH jt AROM WFL s symptoms to facilitate usual lifting, reaching, self care at [facility].    Time 8    Period Weeks    Status New    Target Date 05/13/20      PT LONG TERM GOAL #4   Title Patient will demonstrate Rt UE MMT equal to Lt throughout to facilitate usual lifting, carrying in functional activity to PLOF s limitation.    Time 8    Period Weeks    Status New    Target Date 05/13/20      PT LONG TERM GOAL #5   Title Pt. will demonstrate ability to perform work at [employer].    Time 8    Period Weeks    Status New    Target Date 05/13/20                 Plan - 03/26/20 0836    Clinical Impression Statement Pt tolerated session well  today with positive response to DN last visit, so repeated again with twitch responses noted.  Overall progressing well with PT.  No goals met as only 2nd visit.    Personal Factors and Comorbidities Comorbidity 1    Comorbidities HTN    Examination-Activity Limitations Bathing;Carry;Dressing;Lift;Reach Overhead    Stability/Clinical Decision Making Stable/Uncomplicated    Rehab Potential Good    PT Frequency --   1-2x/week   PT Duration 8 weeks    PT Treatment/Interventions ADLs/Self Care Home Management;Cryotherapy;Electrical Stimulation;Iontophoresis 3m/ml Dexamethasone;Moist Heat;Traction;Balance training;Therapeutic exercise;Therapeutic activities;Functional mobility training;Stair training;Gait training;Ultrasound;Neuromuscular re-education;Patient/family education;Manual techniques;Taping;Vasopneumatic Device;Dry needling;Passive range of motion;Spinal Manipulations;Joint Manipulations    PT Next Visit Plan DN as appropriate, active mobility strengthening; look at HEP and address STG    PT Home Exercise Plan [email or web address]    Consulted and Agree with Plan of Care Patient           Patient will benefit from skilled therapeutic intervention in order to improve the following deficits and impairments:  Hypomobility, Decreased activity tolerance, Decreased strength, Impaired UE functional use, Pain, Increased muscle spasms, Decreased mobility, Decreased range of motion, Impaired perceived functional ability, Impaired flexibility, Decreased coordination  Visit Diagnosis: Chronic right shoulder pain  Muscle weakness (generalized)  Abnormal posture     Problem List Patient Active Problem List   Diagnosis Date Noted  . Intertrochanteric fracture of right femur ([place] 11/11/2014  . Fracture, intertrochanteric, right femur ([facility] 11/11/2014  . Anemia 11/11/2014     SLaureen Abrahams PT, DPT 03/26/20 8:40 AM    [facility] [address] [address]Phone: [phone]  Fax:  [phone] Name: GMichaelangelo MittelmanMRN: [id number] of Birth: [birth year]

## 2020-04-03 ENCOUNTER — Other Ambulatory Visit: Payer: Self-pay

## 2020-04-03 ENCOUNTER — Ambulatory Visit (INDEPENDENT_AMBULATORY_CARE_PROVIDER_SITE_OTHER): Payer: Self-pay | Admitting: Rehabilitative and Restorative Service Providers"

## 2020-04-03 ENCOUNTER — Encounter: Payer: Self-pay | Admitting: Rehabilitative and Restorative Service Providers"

## 2020-04-03 DIAGNOSIS — R293 Abnormal posture: Secondary | ICD-10-CM

## 2020-04-03 DIAGNOSIS — M25511 Pain in right shoulder: Secondary | ICD-10-CM

## 2020-04-03 DIAGNOSIS — M6281 Muscle weakness (generalized): Secondary | ICD-10-CM

## 2020-04-03 DIAGNOSIS — G8929 Other chronic pain: Secondary | ICD-10-CM

## 2020-04-03 NOTE — Therapy (Addendum)
[facility] Physical Therapy [address] [place], [place], [address] Phone: [phone]   Fax:  [phone]  Physical Therapy Treatment  Patient Details  Name: Luis Marquez MRN: [id number] Date of Birth: 20-Jan-1961 Referring Provider (PT): Dr. Prince Rome   Encounter Date: 04/03/2020   PT End of Session - 04/03/20 0816    Visit Number 3    Number of Visits 12    Date for PT Re-Evaluation 05/13/20    Progress Note Due on Visit 10    PT Start Time 0801    PT Stop Time 0840    PT Time Calculation (min) 39 min    Activity Tolerance Patient tolerated treatment well    Behavior During Therapy [facility] for tasks assessed/performed           Past Medical History:  Diagnosis Date  . Hypertension     Past Surgical History:  Procedure Laterality Date  . HERNIA REPAIR    . INTRAMEDULLARY (IM) NAIL INTERTROCHANTERIC Right 11/11/2014   Procedure: INTRAMEDULLARY (IM) NAIL INTERTROCHANTRIC;  Surgeon: Durene Romans, MD;  Location: [facility];  Service: Orthopedics;  Laterality: Right;  . Rod right leg      There were no vitals filed for this visit.   Subjective Assessment - 04/03/20 0815    Subjective Pt. indicated still feeling improvement from each visit.  Pt. stated some difficulty c overhead work on tree that caused some pain.  No pain at rest reported today.    Pertinent History No previous history of injury to Rt shoulder indicated    Diagnostic tests xrays were negative    Patient Stated Goals Reduce pain    Currently in Pain? No/denies    Pain Score 0-No pain                             [facility] Adult PT Treatment/Exercise - 04/03/20 0001      Shoulder Exercises: Supine   Other Supine Exercises supine 90 deg flexion circles cw, ccw 30 x 2 each way      Shoulder Exercises: Standing   External Rotation Right;Theraband   3 x 10   Theraband Level (Shoulder External Rotation) Level 4 (Blue)    Extension Both;Strengthening;Theraband   blue 3 x 15    Theraband Level (Shoulder Extension) Level 4 (Blue)    Row Both;Strengthening;Theraband   3 x 15   Theraband Level (Shoulder Row) Level 4 (Blue)    Other Standing Exercises --      Shoulder Exercises: Pulleys   Flexion 2 minutes    Scaption 2 minutes      Shoulder Exercises: ROM/Strengthening   UBE (Upper Arm Bike) Lvl 2.5 3 mins fwd/back each way      Manual Therapy   Manual therapy comments compression to Rt infraspinatus, STM with IASTM            Trigger Point Dry Needling - 04/03/20 0001    Consent Given? Yes    Education Handout Provided Previously provided    Muscles Treated Upper Quadrant Infraspinatus   Rt   Infraspinatus Response Twitch response elicited                PT Education - 04/03/20 0827    Education Details HEP Update    Person(s) Educated Patient    Methods Explanation;Demonstration;Handout;Verbal cues    Comprehension Verbalized understanding;Returned demonstration            PT Short Term Goals -  04/03/20 0815      PT SHORT TERM GOAL #1   Title Patient will demonstrate independent use of home exercise program to maintain progress from in clinic treatments.    Time 2    Period Weeks    Status Achieved    Target Date 04/01/20             PT Long Term Goals - 03/18/20 1603      PT LONG TERM GOAL #1   Title Patient will demonstrate/report pain at worst less than or equal to 2/10 to facilitate minimal limitation in daily activity secondary to pain symptoms.    Time 8    Period Weeks    Status New    Target Date 05/13/20      PT LONG TERM GOAL #2   Title Patient will demonstrate independent use of home exercise program to facilitate ability to maintain/progress functional gains from skilled physical therapy services.    Time 8    Period Weeks    Status New    Target Date 05/13/20      PT LONG TERM GOAL #3   Title Pt. will demonstrate Rt GH jt AROM WFL s symptoms to facilitate usual lifting, reaching, self care at [facility].     Time 8    Period Weeks    Status New    Target Date 05/13/20      PT LONG TERM GOAL #4   Title Patient will demonstrate Rt UE MMT equal to Lt throughout to facilitate usual lifting, carrying in functional activity to PLOF s limitation.    Time 8    Period Weeks    Status New    Target Date 05/13/20      PT LONG TERM GOAL #5   Title Pt. will demonstrate ability to perform work at [employer].    Time 8    Period Weeks    Status New    Target Date 05/13/20                 Plan - 04/03/20 0825    Clinical Impression Statement Continued reported improvement from dry needling/manual intervention for immediate symptom relief.  Pt. may continue to benefit from skilled PT services to improve strength and progress to full range AROM to facilitate overhead reaching, lifting for usual activity.    Personal Factors and Comorbidities Comorbidity 1    Comorbidities HTN    Examination-Activity Limitations Bathing;Carry;Dressing;Lift;Reach Overhead    Stability/Clinical Decision Making Stable/Uncomplicated    Rehab Potential Good    PT Frequency --   1-2x/week   PT Duration 8 weeks    PT Treatment/Interventions ADLs/Self Care Home Management;Cryotherapy;Electrical Stimulation;Iontophoresis 4mg /ml Dexamethasone;Moist Heat;Traction;Balance training;Therapeutic exercise;Therapeutic activities;Functional mobility training;Stair training;Gait training;Ultrasound;Neuromuscular re-education;Patient/family education;Manual techniques;Taping;Vasopneumatic Device;Dry needling;Passive range of motion;Spinal Manipulations;Joint Manipulations    PT Next Visit Plan DN as appropriate, active mobility strengthening, resistance.    PT Home Exercise Plan [email or web address]    Consulted and Agree with Plan of Care Patient           Patient will benefit from skilled therapeutic intervention in order to improve the following deficits and impairments:  Hypomobility, Decreased activity tolerance, Decreased strength, Impaired  UE functional use, Pain, Increased muscle spasms, Decreased mobility, Decreased range of motion, Impaired perceived functional ability, Impaired flexibility, Decreased coordination  Visit Diagnosis: Chronic right shoulder pain  Muscle weakness (generalized)  Abnormal posture     Problem List Patient Active Problem List   Diagnosis Date Noted  . Intertrochanteric  fracture of right femur (HCC) 11/11/2014  . Fracture, intertrochanteric, right femur (HCC) 11/11/2014  . Anemia 11/11/2014   Chyrel Masson, PT, DPT, OCS, ATC 04/03/20  8:38 AM    [facility] Physical Therapy [address] [place], [place], [address] Phone: [phone]   Fax:  [phone]  Name: Taishaun Levels MRN: [id number] Date of Birth: June 04, 1961

## 2020-04-03 NOTE — Patient Instructions (Signed)
Access Code: [email or web address] URL: [email or web address] Date: 04/03/2020 Prepared by: Chyrel Masson  Exercises Supine Cross Body Shoulder Stretch - 2 x daily - 7 x weekly - 1 sets - 5 reps - 30 hold Seated Scapular Retraction - 2 x daily - 7 x weekly - 10 reps - 2 sets - 5 hold Supine Shoulder Flexion Extension AAROM with Dowel - 1 x daily - 7 x weekly - 10 reps - 3 sets - 5 hold Standing Shoulder Row with Anchored Resistance - 1 x daily - 7 x weekly - 3 sets - 10 reps Shoulder Extension with Resistance - 1 x daily - 7 x weekly - 3 sets - 10 reps Shoulder External Rotation with Anchored Resistance - 1 x daily - 7 x weekly - 3 sets - 10 reps Shoulder Internal Rotation with Resistance - 1 x daily - 7 x weekly - 3 sets - 10 reps

## 2020-04-07 ENCOUNTER — Encounter: Payer: Self-pay | Admitting: Rehabilitative and Restorative Service Providers"

## 2020-04-07 ENCOUNTER — Ambulatory Visit (INDEPENDENT_AMBULATORY_CARE_PROVIDER_SITE_OTHER): Payer: Self-pay | Admitting: Rehabilitative and Restorative Service Providers"

## 2020-04-07 ENCOUNTER — Other Ambulatory Visit: Payer: Self-pay

## 2020-04-07 DIAGNOSIS — M6281 Muscle weakness (generalized): Secondary | ICD-10-CM

## 2020-04-07 DIAGNOSIS — G8929 Other chronic pain: Secondary | ICD-10-CM

## 2020-04-07 DIAGNOSIS — R293 Abnormal posture: Secondary | ICD-10-CM

## 2020-04-07 DIAGNOSIS — M25511 Pain in right shoulder: Secondary | ICD-10-CM

## 2020-04-07 NOTE — Therapy (Signed)
[facility] Physical Therapy [address] [place], [place], [address] Phone: [phone]   Fax:  [phone]  Physical Therapy Treatment  Patient Details  Name: Luis Marquez MRN: [id number] Date of Birth: October 16, 1960 Referring Provider (PT): Dr. Prince Rome   Encounter Date: 04/07/2020   PT End of Session - 04/07/20 0850    Visit Number 4    Number of Visits 12    Date for PT Re-Evaluation 05/13/20    Progress Note Due on Visit 10    PT Start Time 0847    PT Stop Time 0926    PT Time Calculation (min) 39 min    Activity Tolerance Patient tolerated treatment well    Behavior During Therapy [facility] for tasks assessed/performed           Past Medical History:  Diagnosis Date  . Hypertension     Past Surgical History:  Procedure Laterality Date  . HERNIA REPAIR    . INTRAMEDULLARY (IM) NAIL INTERTROCHANTERIC Right 11/11/2014   Procedure: INTRAMEDULLARY (IM) NAIL INTERTROCHANTRIC;  Surgeon: Durene Romans, MD;  Location: [facility];  Service: Orthopedics;  Laterality: Right;  . Rod right leg      There were no vitals filed for this visit.   Subjective Assessment - 04/07/20 0849    Subjective Pt. stated no pain upon arrival but reported some tightness as most typical complaint.  Pt. stated pain has reduced greatly.    Pertinent History No previous history of injury to Rt shoulder indicated    Diagnostic tests xrays were negative    Patient Stated Goals Reduce pain    Currently in Pain? No/denies              [facility] PT Assessment - 04/07/20 0001      PROM   Right Shoulder Flexion 160 Degrees                         [facility] Adult PT Treatment/Exercise - 04/07/20 0001      Shoulder Exercises: Supine   Horizontal ABduction Strengthening;Both   3 x 10 green band   Theraband Level (Shoulder Horizontal ABduction) Level 3 (Green)    Flexion Right;Strengthening;10 reps    Other Supine Exercises supine wand flexion 2 lb bar x 15 c 2-3 second hold at  top    Other Supine Exercises 90 deg flexion small circles cw, ccw 30 x 2 each way 1 lb      Shoulder Exercises: Sidelying   Flexion Strengthening;Right   x10 - painful   ABduction Strengthening;Right   3 x 10     Shoulder Exercises: ROM/Strengthening   UBE (Upper Arm Bike) Lvl 3 3 mins fwd/back each way                    PT Short Term Goals - 04/03/20 0815      PT SHORT TERM GOAL #1   Title Patient will demonstrate independent use of home exercise program to maintain progress from in clinic treatments.    Time 2    Period Weeks    Status Achieved    Target Date 04/01/20             PT Long Term Goals - 04/07/20 0915      PT LONG TERM GOAL #1   Title Patient will demonstrate/report pain at worst less than or equal to 2/10 to facilitate minimal limitation in daily activity secondary to pain symptoms.    Time  8    Period Weeks    Status On-going    Target Date 05/13/20      PT LONG TERM GOAL #2   Title Patient will demonstrate independent use of home exercise program to facilitate ability to maintain/progress functional gains from skilled physical therapy services.    Time 8    Period Weeks    Status On-going    Target Date 05/13/20      PT LONG TERM GOAL #3   Title Pt. will demonstrate Rt GH jt AROM WFL s symptoms to facilitate usual lifting, reaching, self care at [facility].    Time 8    Period Weeks    Status On-going    Target Date 05/13/20      PT LONG TERM GOAL #4   Title Patient will demonstrate Rt UE MMT equal to Lt throughout to facilitate usual lifting, carrying in functional activity to PLOF s limitation.    Time 8    Period Weeks    Status On-going    Target Date 05/13/20      PT LONG TERM GOAL #5   Title Pt. will demonstrate ability to perform work at [employer].    Time 8    Period Weeks    Status On-going    Target Date 05/13/20                 Plan - 04/07/20 0858    Clinical Impression Statement Passive mobility greatly improved  c reduced symptoms in mid range, discomfort at end ranges at times.  Pt. may continue to benefit from plan in clinic and HEP to improve active movement/strength to facilitate improved functional activity tolerance and performance.    Personal Factors and Comorbidities Comorbidity 1    Comorbidities HTN    Examination-Activity Limitations Bathing;Carry;Dressing;Lift;Reach Overhead    Stability/Clinical Decision Making Stable/Uncomplicated    Rehab Potential Good    PT Frequency --   1-2x/week   PT Duration 8 weeks    PT Treatment/Interventions ADLs/Self Care Home Management;Cryotherapy;Electrical Stimulation;Iontophoresis 4mg /ml Dexamethasone;Moist Heat;Traction;Balance training;Therapeutic exercise;Therapeutic activities;Functional mobility training;Stair training;Gait training;Ultrasound;Neuromuscular re-education;Patient/family education;Manual techniques;Taping;Vasopneumatic Device;Dry needling;Passive range of motion;Spinal Manipulations;Joint Manipulations    PT Next Visit Plan DN as appropriate, active mobility strengthening, resistance.    PT Home Exercise Plan [email or web address]    Consulted and Agree with Plan of Care Patient           Patient will benefit from skilled therapeutic intervention in order to improve the following deficits and impairments:  Hypomobility, Decreased activity tolerance, Decreased strength, Impaired UE functional use, Pain, Increased muscle spasms, Decreased mobility, Decreased range of motion, Impaired perceived functional ability, Impaired flexibility, Decreased coordination  Visit Diagnosis: Chronic right shoulder pain  Muscle weakness (generalized)  Abnormal posture     Problem List Patient Active Problem List   Diagnosis Date Noted  . Intertrochanteric fracture of right femur (HCC) 11/11/2014  . Fracture, intertrochanteric, right femur (HCC) 11/11/2014  . Anemia 11/11/2014   11/13/2014, PT, DPT, OCS, ATC 04/07/20  9:19 AM    [facility] Physical Therapy [address] [place], [place], [place] Phone: [phone]   Fax:  [phone]  Name: Luis Marquez MRN: Vikki Ports Date of Birth: 1960/10/20

## 2020-04-08 ENCOUNTER — Encounter: Payer: Self-pay | Admitting: Nurse Practitioner

## 2020-04-08 ENCOUNTER — Ambulatory Visit: Payer: Self-pay | Attending: Nurse Practitioner | Admitting: Nurse Practitioner

## 2020-04-08 VITALS — BP 137/76 | HR 99 | Temp 97.7°F | Ht 73.0 in | Wt 212.0 lb

## 2020-04-08 DIAGNOSIS — I10 Essential (primary) hypertension: Secondary | ICD-10-CM

## 2020-04-08 NOTE — Progress Notes (Signed)
Assessment & Plan:  Luis Marquez was seen today for follow-up.  Diagnoses and all orders for this visit:  Essential hypertension Continue all antihypertensives as prescribed.  Remember to bring in your blood pressure log with you for your follow up appointment.  DASH/Mediterranean Diets are healthier choices for HTN.     Patient has been counseled on age-appropriate routine health concerns for screening and prevention. These are reviewed and up-to-date. Referrals have been placed accordingly. Immunizations are up-to-date or declined.    Subjective:   Chief Complaint  Patient presents with  . Follow-up    Pt. is here for HTN f.u.    [name] 59 y.o. male presents to office today for follow up  has a past medical history of Hypertension.   Essential Hypertension Well controlled. He is taking amlodipine 10 mg daily and lisinopril 20 mg daily as prescribed. Denies chest pain, shortness of breath, palpitations, lightheadedness, dizziness, headaches or BLE edema.  BP Readings from Last 3 Encounters:  04/08/20 137/76  11/12/19 (!) 142/85  06/27/19 (!) 158/78   Review of Systems  Constitutional: Negative for fever, malaise/fatigue and weight loss.  HENT: Negative.  Negative for nosebleeds.   Eyes: Negative.  Negative for blurred vision, double vision and photophobia.  Respiratory: Negative.  Negative for cough and shortness of breath.   Cardiovascular: Negative.  Negative for chest pain, palpitations and leg swelling.  Gastrointestinal: Negative.  Negative for heartburn, nausea and vomiting.  Musculoskeletal: Positive for joint pain (right shoulder; pain improving). Negative for myalgias.  Neurological: Negative.  Negative for dizziness, focal weakness, seizures and headaches.  Psychiatric/Behavioral: Negative.  Negative for suicidal ideas.    Past Medical History:  Diagnosis Date  . Hypertension     Past Surgical History:  Procedure Laterality Date  . HERNIA REPAIR      . INTRAMEDULLARY (IM) NAIL INTERTROCHANTERIC Right 11/11/2014   Procedure: INTRAMEDULLARY (IM) NAIL INTERTROCHANTRIC;  Surgeon: Durene Romans, MD;  Location: [facility];  Service: Orthopedics;  Laterality: Right;  . Rod right leg      Family History  Problem Relation Age of Onset  . Diabetes Mother     Social History Reviewed with no changes to be made today.   Outpatient Medications Prior to Visit  Medication Sig Dispense Refill  . amLODipine (NORVASC) 10 MG tablet Take 1 tablet (10 mg total) by mouth daily. 90 tablet 3  . ferrous sulfate 325 (65 FE) MG tablet Take 1 tablet (325 mg total) by mouth 2 (two) times daily with a meal. 180 tablet 3  . GLUCOS-CHONDROIT-MSM-C-HYAL PO Take 1 tablet by mouth daily.    Marland Kitchen lisinopril (ZESTRIL) 20 MG tablet Take 1 tablet (20 mg total) by mouth daily. 90 tablet 1  . Multiple Vitamin (MULTIVITAMIN WITH MINERALS) TABS tablet Take 1 tablet by mouth daily.    . nabumetone (RELAFEN) 750 MG tablet Take 1 tablet (750 mg total) by mouth 2 (two) times daily as needed. 60 tablet 6  . VENTOLIN HFA 108 (90 Base) MCG/ACT inhaler INHALE 2 PUFFS INTO THE LUNGS EVERY 6 (SIX) HOURS AS NEEDED FOR WHEEZING OR SHORTNESS OF BREATH (COUGH). 18 g 1  . diclofenac Sodium (VOLTAREN) 1 % GEL Apply 1 application topically 4 (four) times daily. (Patient not taking: Reported on 04/08/2020)    . furosemide (LASIX) 20 MG tablet Take 1 tablet (20 mg total) by mouth daily as needed. 30 tablet 3   No facility-administered medications prior to visit.    No Known Allergies  Objective:    BP 137/76 (BP Location: Left Arm, Patient Position: Sitting, Cuff Size: Normal)   Pulse 99   Temp 97.7 F (36.5 C) (Temporal)   Ht 6\' 1"  (1.854 m)   Wt 212 lb (96.2 kg)   SpO2 96%   BMI 27.97 kg/m  Wt Readings from Last 3 Encounters:  04/08/20 212 lb (96.2 kg)  11/12/19 219 lb 6.4 oz (99.5 kg)  04/22/19 207 lb (93.9 kg)    Physical Exam Vitals and nursing note reviewed.  Constitutional:       Appearance: He is well-developed.  HENT:     Head: Normocephalic and atraumatic.  Cardiovascular:     Rate and Rhythm: Normal rate and regular rhythm.     Heart sounds: Normal heart sounds. No murmur heard.  No friction rub. No gallop.   Pulmonary:     Effort: Pulmonary effort is normal. No tachypnea or respiratory distress.     Breath sounds: Normal breath sounds. No decreased breath sounds, wheezing, rhonchi or rales.  Chest:     Chest wall: No tenderness.  Abdominal:     General: Bowel sounds are normal.     Palpations: Abdomen is soft.  Musculoskeletal:        General: Normal range of motion.     Cervical back: Normal range of motion.  Skin:    General: Skin is warm and dry.  Neurological:     Mental Status: He is alert and oriented to person, place, and time.     Coordination: Coordination normal.  Psychiatric:        Behavior: Behavior normal. Behavior is cooperative.        Thought Content: Thought content normal.        Judgment: Judgment normal.          Patient has been counseled extensively about nutrition and exercise as well as the importance of adherence with medications and regular follow-up. The patient was given clear instructions to go to ER or return to medical center if symptoms don't improve, worsen or new problems develop. The patient verbalized understanding.   Follow-up: Return in about 3 months (around 07/08/2020).   07/10/2020, FNP-BC [facility] [place], [place]   04/08/2020, 3:57 PM

## 2020-04-09 ENCOUNTER — Encounter: Payer: Self-pay | Admitting: Rehabilitative and Restorative Service Providers"

## 2020-04-09 ENCOUNTER — Other Ambulatory Visit: Payer: Self-pay

## 2020-04-09 ENCOUNTER — Ambulatory Visit (INDEPENDENT_AMBULATORY_CARE_PROVIDER_SITE_OTHER): Payer: Self-pay | Admitting: Rehabilitative and Restorative Service Providers"

## 2020-04-09 DIAGNOSIS — M25511 Pain in right shoulder: Secondary | ICD-10-CM

## 2020-04-09 DIAGNOSIS — G8929 Other chronic pain: Secondary | ICD-10-CM

## 2020-04-09 DIAGNOSIS — R293 Abnormal posture: Secondary | ICD-10-CM

## 2020-04-09 DIAGNOSIS — M6281 Muscle weakness (generalized): Secondary | ICD-10-CM

## 2020-04-09 NOTE — Therapy (Signed)
[facility] Physical Therapy [address] [place], [place], [address] Phone: [phone]   Fax:  [phone]  Physical Therapy Treatment  Patient Details  Name: Luis Marquez MRN: [id number] Date of Birth: Apr 08, 1961 Referring Provider (PT): Dr. Prince Rome   Encounter Date: 04/09/2020   PT End of Session - 04/09/20 1438    Visit Number 5    Number of Visits 12    Date for PT Re-Evaluation 05/13/20    Progress Note Due on Visit 10    PT Start Time 1430    PT Stop Time 1510    PT Time Calculation (min) 40 min    Activity Tolerance Patient limited by pain    Behavior During Therapy [facility] for tasks assessed/performed           Past Medical History:  Diagnosis Date  . Hypertension     Past Surgical History:  Procedure Laterality Date  . HERNIA REPAIR    . INTRAMEDULLARY (IM) NAIL INTERTROCHANTERIC Right 11/11/2014   Procedure: INTRAMEDULLARY (IM) NAIL INTERTROCHANTRIC;  Surgeon: Durene Romans, MD;  Location: [facility];  Service: Orthopedics;  Laterality: Right;  . Rod right leg      There were no vitals filed for this visit.   Subjective Assessment - 04/09/20 1437    Subjective Pt. reported increased soreness complaints today, maybe due to weather per report.  Pt. stated he had increased difficulty c lifting/reaching today.    Pertinent History No previous history of injury to Rt shoulder indicated    Diagnostic tests xrays were negative    Patient Stated Goals Reduce pain    Currently in Pain? Yes    Pain Location Shoulder    Pain Orientation Right    Pain Descriptors / Indicators Aching;Sore    Pain Type Chronic pain    Pain Onset More than a month ago    Pain Frequency Intermittent    Aggravating Factors  insidious worsening last day (weather?)    Pain Relieving Factors movement light    Effect of Pain on Daily Activities Elevation limitations, lifting.                             [facility] Adult PT Treatment/Exercise - 04/09/20 0001       Shoulder Exercises: Seated   Other Seated Exercises green band lat pull down at tower 3 x 10      Shoulder Exercises: Standing   Internal Rotation Right;Strengthening;20 reps;Theraband    Theraband Level (Shoulder Internal Rotation) Level 3 (Green)    Row Both;Strengthening   3 x 15 green   Theraband Level (Shoulder Row) Level 3 (Green)      Shoulder Exercises: Pulleys   Flexion 3 minutes      Shoulder Exercises: ROM/Strengthening   UBE (Upper Arm Bike) lvl 3 2 mins alt fwd/back for 8 mins total      Modalities   Modalities Electrical Stimulation      Electrical Stimulation   Electrical Stimulation Location Rt shoulder    Electrical Stimulation Action IFC    Electrical Stimulation Parameters 10 mins to tolerance    Electrical Stimulation Goals Pain                    PT Short Term Goals - 04/03/20 0815      PT SHORT TERM GOAL #1   Title Patient will demonstrate independent use of home exercise program to maintain progress from in clinic treatments.  Time 2    Period Weeks    Status Achieved    Target Date 04/01/20             PT Long Term Goals - 04/07/20 0915      PT LONG TERM GOAL #1   Title Patient will demonstrate/report pain at worst less than or equal to 2/10 to facilitate minimal limitation in daily activity secondary to pain symptoms.    Time 8    Period Weeks    Status On-going    Target Date 05/13/20      PT LONG TERM GOAL #2   Title Patient will demonstrate independent use of home exercise program to facilitate ability to maintain/progress functional gains from skilled physical therapy services.    Time 8    Period Weeks    Status On-going    Target Date 05/13/20      PT LONG TERM GOAL #3   Title Pt. will demonstrate Rt GH jt AROM WFL s symptoms to facilitate usual lifting, reaching, self care at [facility].    Time 8    Period Weeks    Status On-going    Target Date 05/13/20      PT LONG TERM GOAL #4   Title Patient will demonstrate  Rt UE MMT equal to Lt throughout to facilitate usual lifting, carrying in functional activity to PLOF s limitation.    Time 8    Period Weeks    Status On-going    Target Date 05/13/20      PT LONG TERM GOAL #5   Title Pt. will demonstrate ability to perform work at [employer].    Time 8    Period Weeks    Status On-going    Target Date 05/13/20                 Plan - 04/09/20 1455    Clinical Impression Statement Insidious exacerbation of symptoms noted upon arrival, limiting overall progression of strength intervention today and treated appropriately c modalities and exercise difficulty reduction.  Presentation today showed early shrug noted in elevation, worsened from last visit presentation.    Personal Factors and Comorbidities Comorbidity 1    Comorbidities HTN    Examination-Activity Limitations Bathing;Carry;Dressing;Lift;Reach Overhead    Stability/Clinical Decision Making Stable/Uncomplicated    Rehab Potential Good    PT Frequency --   1-2x/week   PT Duration 8 weeks    PT Treatment/Interventions ADLs/Self Care Home Management;Cryotherapy;Electrical Stimulation;Iontophoresis 4mg /ml Dexamethasone;Moist Heat;Traction;Balance training;Therapeutic exercise;Therapeutic activities;Functional mobility training;Stair training;Gait training;Ultrasound;Neuromuscular re-education;Patient/family education;Manual techniques;Taping;Vasopneumatic Device;Dry needling;Passive range of motion;Spinal Manipulations;Joint Manipulations    PT Next Visit Plan Resume active mobility/strength progress as tolerated, dn, estim prn.    PT Home Exercise Plan [email or web address]    Consulted and Agree with Plan of Care Patient           Patient will benefit from skilled therapeutic intervention in order to improve the following deficits and impairments:  Hypomobility, Decreased activity tolerance, Decreased strength, Impaired UE functional use, Pain, Increased muscle spasms, Decreased mobility, Decreased range  of motion, Impaired perceived functional ability, Impaired flexibility, Decreased coordination  Visit Diagnosis: Chronic right shoulder pain  Muscle weakness (generalized)  Abnormal posture     Problem List Patient Active Problem List   Diagnosis Date Noted  . Intertrochanteric fracture of right femur (HCC) 11/11/2014  . Fracture, intertrochanteric, right femur (HCC) 11/11/2014  . Anemia 11/11/2014   11/13/2014, PT, DPT, OCS, ATC 04/09/20  3:02 PM  [facility] Physical Therapy [address] [place], [place], [address] Phone: [phone]   Fax:  [phone]  Name: Luis Marquez MRN: [id number] Date of Birth: Nov 12, 1960

## 2020-04-13 ENCOUNTER — Encounter: Payer: Self-pay | Admitting: Rehabilitative and Restorative Service Providers"

## 2020-04-14 ENCOUNTER — Encounter: Payer: Self-pay | Admitting: Rehabilitative and Restorative Service Providers"

## 2020-04-16 MED FILL — FERROUS SULFATE 325 MG TAB: 325 (65 FE) | 30 days supply | Qty: 60 | Fill #0

## 2020-04-16 MED FILL — NABUMETONE 750 MG TABS: 750 | 30 days supply | Qty: 60 | Fill #1

## 2020-04-16 MED FILL — $VENTOLIN HFA 18G INHALER: 108 (90 BAS | 25 days supply | Qty: 18 | Fill #1

## 2020-04-16 MED FILL — LISINOPRIL 20 MG TABLET: 20 | 30 days supply | Qty: 30 | Fill #5

## 2020-04-16 MED FILL — MELOXICAM 15 MG TABLET: 15 | 30 days supply | Qty: 30 | Fill #0

## 2020-04-16 MED FILL — FUROSEMIDE 20 MG TABS: 20 | 30 days supply | Qty: 30 | Fill #0

## 2020-04-20 ENCOUNTER — Encounter: Payer: Self-pay | Admitting: Rehabilitative and Restorative Service Providers"

## 2020-04-21 MED FILL — AMLODIPINE BESYLATE 10 MG T: 10 | 30 days supply | Qty: 30 | Fill #0

## 2020-05-07 MED FILL — $BREO ELLIPTA 200-25 MCG IN: 200-25 | 90 days supply | Qty: 180 | Fill #1

## 2020-05-07 MED FILL — AMLODIPINE BESYLATE 10 MG T: 10 | 30 days supply | Qty: 30 | Fill #1

## 2020-05-07 MED FILL — LISINOPRIL 20 MG TABLET: 20 | 30 days supply | Qty: 30 | Fill #0

## 2020-05-08 ENCOUNTER — Ambulatory Visit (INDEPENDENT_AMBULATORY_CARE_PROVIDER_SITE_OTHER): Payer: Self-pay | Admitting: Rehabilitative and Restorative Service Providers"

## 2020-05-08 ENCOUNTER — Other Ambulatory Visit: Payer: Self-pay

## 2020-05-08 DIAGNOSIS — R293 Abnormal posture: Secondary | ICD-10-CM

## 2020-05-08 DIAGNOSIS — M6281 Muscle weakness (generalized): Secondary | ICD-10-CM

## 2020-05-08 DIAGNOSIS — M25511 Pain in right shoulder: Secondary | ICD-10-CM

## 2020-05-08 DIAGNOSIS — G8929 Other chronic pain: Secondary | ICD-10-CM

## 2020-05-08 NOTE — Therapy (Signed)
[facility] Physical Therapy [address] [place], [place], [address] Phone: [phone]   Fax:  [phone]  Physical Therapy Treatment  Patient Details  Name: Luis Marquez MRN: [id number] Date of Birth: Jan 26, 1961 Referring Provider (PT): Dr. Prince Rome   Encounter Date: 05/08/2020   PT End of Session - 05/08/20 1350    Visit Number 6    Number of Visits 12    Date for PT Re-Evaluation 05/13/20    Progress Note Due on Visit 10    PT Start Time 1351    PT Stop Time 1421    PT Time Calculation (min) 30 min    Activity Tolerance Patient tolerated treatment well    Behavior During Therapy [facility] for tasks assessed/performed           Past Medical History:  Diagnosis Date  . Hypertension     Past Surgical History:  Procedure Laterality Date  . HERNIA REPAIR    . INTRAMEDULLARY (IM) NAIL INTERTROCHANTERIC Right 11/11/2014   Procedure: INTRAMEDULLARY (IM) NAIL INTERTROCHANTRIC;  Surgeon: Durene Romans, MD;  Location: [facility];  Service: Orthopedics;  Laterality: Right;  . Rod right leg      There were no vitals filed for this visit.   Subjective Assessment - 05/08/20 1351    Subjective Pt. stated feeling improvement overall, rested for about 10 days.  Pt. stated feeling occasional complaints, tightness at times.  Increased activity leads to some trouble as well.    Pertinent History No previous history of injury to Rt shoulder indicated    Diagnostic tests xrays were negative    Patient Stated Goals Reduce pain    Pain Score 5    at worst   Pain Location Shoulder    Pain Orientation Right;Anterior    Pain Descriptors / Indicators Aching;Tightness    Pain Type Chronic pain    Pain Onset More than a month ago    Pain Frequency Occasional    Aggravating Factors  activity level overuse, lying on shoulder.    Pain Relieving Factors move off shoulder when sleeping, stretching/rest              [facility] PT Assessment - 05/08/20 0001      Assessment    Medical Diagnosis Chronic Rt shoulder pain    Referring Provider (PT) Dr. Prince Rome    Onset Date/Surgical Date 09/16/19    Hand Dominance Right      Strength   Right Shoulder Flexion 2+/5                         [facility] Adult PT Treatment/Exercise - 05/08/20 0001      Neuro Re-ed    Neuro Re-ed Details  rhythmic stabilizations 100 deg flexion supine c mild to moderate resistances, 30 sec bouts      Shoulder Exercises: Supine   Horizontal ABduction Strengthening;Both;20 reps    Theraband Level (Shoulder Horizontal ABduction) Level 3 (Green)    Flexion Right;Strengthening   to fatigue x 25 1 lb   Other Supine Exercises supine wand flexion 2 lbs 20x,       Shoulder Exercises: Sidelying   ABduction Strengthening;Right   3 x 10   ABduction Weight (lbs) 1   1 lb 1 set, 2 x 10 0 lbs     Shoulder Exercises: ROM/Strengthening   UBE (Upper Arm Bike) Lvl 3 3 mins fwd/back each way  PT Short Term Goals - 04/03/20 0815      PT SHORT TERM GOAL #1   Title Patient will demonstrate independent use of home exercise program to maintain progress from in clinic treatments.    Time 2    Period Weeks    Status Achieved    Target Date 04/01/20             PT Long Term Goals - 05/08/20 1355      PT LONG TERM GOAL #1   Title Patient will demonstrate/report pain at worst less than or equal to 2/10 to facilitate minimal limitation in daily activity secondary to pain symptoms.    Time 8    Period Weeks    Status On-going      PT LONG TERM GOAL #2   Title Patient will demonstrate independent use of home exercise program to facilitate ability to maintain/progress functional gains from skilled physical therapy services.    Time 8    Period Weeks    Status Achieved      PT LONG TERM GOAL #3   Title Pt. will demonstrate Rt GH jt AROM WFL s symptoms to facilitate usual lifting, reaching, self care at [facility].    Time 8    Period Weeks    Status On-going        PT LONG TERM GOAL #4   Title Patient will demonstrate Rt UE MMT equal to Lt throughout to facilitate usual lifting, carrying in functional activity to PLOF s limitation.    Time 8    Period Weeks    Status On-going      PT LONG TERM GOAL #5   Title Pt. will demonstrate ability to perform work at [employer].    Time 8    Period Weeks    Status Achieved                 Plan - 05/08/20 1355    Clinical Impression Statement In extended time since last visit, Pt. reported overall improvement total at 75% or so at this time.  Still having some complaints of anterior shoulder c increased overuse activity and sleeping on shoulder but overall reported less difficulty c daily activity on usual basis.  Presentation in clinic today revealed continued Rt shoulder strength deficits in elevation against gravity/resistance c shrug noted at 70 deg elevation attempts and higher.    Personal Factors and Comorbidities Comorbidity 1    Comorbidities HTN    Examination-Activity Limitations Bathing;Carry;Dressing;Lift;Reach Overhead    Stability/Clinical Decision Making Stable/Uncomplicated    Rehab Potential Good    PT Frequency --   1-2x/week   PT Duration 8 weeks    PT Treatment/Interventions ADLs/Self Care Home Management;Cryotherapy;Electrical Stimulation;Iontophoresis 4mg /ml Dexamethasone;Moist Heat;Traction;Balance training;Therapeutic exercise;Therapeutic activities;Functional mobility training;Stair training;Gait training;Ultrasound;Neuromuscular re-education;Patient/family education;Manual techniques;Taping;Vasopneumatic Device;Dry needling;Passive range of motion;Spinal Manipulations;Joint Manipulations    PT Next Visit Plan May benefit from continued skilled PT services for strengthening/symptom reduction    PT Home Exercise Plan [email or web address]    Consulted and Agree with Plan of Care Patient           Patient will benefit from skilled therapeutic intervention in order to improve the following  deficits and impairments:  Hypomobility, Decreased activity tolerance, Decreased strength, Impaired UE functional use, Pain, Increased muscle spasms, Decreased mobility, Decreased range of motion, Impaired perceived functional ability, Impaired flexibility, Decreased coordination  Visit Diagnosis: Chronic right shoulder pain  Muscle weakness (generalized)  Abnormal posture     Problem List Patient  Active Problem List   Diagnosis Date Noted  . Intertrochanteric fracture of right femur (HCC) 11/11/2014  . Fracture, intertrochanteric, right femur (HCC) 11/11/2014  . Anemia 11/11/2014    Chyrel Masson, PT, DPT, OCS, ATC 05/08/20  2:16 PM    [facility] Physical Therapy [address] [place], [place], [address] Phone: [phone]   Fax:  [phone]  Name: Mykai Wendorf MRN: [id number] Date of Birth: 05/08/1961

## 2020-06-10 ENCOUNTER — Ambulatory Visit (INDEPENDENT_AMBULATORY_CARE_PROVIDER_SITE_OTHER): Payer: Self-pay | Admitting: Rehabilitative and Restorative Service Providers"

## 2020-06-10 ENCOUNTER — Other Ambulatory Visit: Payer: Self-pay

## 2020-06-10 ENCOUNTER — Encounter: Payer: Self-pay | Admitting: Rehabilitative and Restorative Service Providers"

## 2020-06-10 DIAGNOSIS — M6281 Muscle weakness (generalized): Secondary | ICD-10-CM

## 2020-06-10 DIAGNOSIS — M25511 Pain in right shoulder: Secondary | ICD-10-CM

## 2020-06-10 DIAGNOSIS — G8929 Other chronic pain: Secondary | ICD-10-CM

## 2020-06-10 DIAGNOSIS — R293 Abnormal posture: Secondary | ICD-10-CM

## 2020-06-10 NOTE — Therapy (Signed)
[facility] Physical Therapy [address] [place], [place], [address] Phone: [phone]   Fax:  [phone]  Physical Therapy Treatment/Recertification  Patient Details  Name: Luis Marquez MRN: [id number] Date of Birth: 1960/08/01 Referring Provider (PT): Dr. Prince Rome   Encounter Date: 06/10/2020   Progress Note Reporting Period 03/18/2020 to 06/10/2020  See note below for Objective Data and Assessment of Progress/Goals.        PT End of Session - 06/10/20 1203    Visit Number 7    Number of Visits 14    Date for PT Re-Evaluation 07/22/20    Progress Note Due on Visit 14    PT Start Time 1157    PT Stop Time 1224    PT Time Calculation (min) 27 min    Activity Tolerance Patient tolerated treatment well    Behavior During Therapy WFL for tasks assessed/performed           Past Medical History:  Diagnosis Date  . Hypertension     Past Surgical History:  Procedure Laterality Date  . HERNIA REPAIR    . INTRAMEDULLARY (IM) NAIL INTERTROCHANTERIC Right 11/11/2014   Procedure: INTRAMEDULLARY (IM) NAIL INTERTROCHANTRIC;  Surgeon: Durene Romans, MD;  Location: [facility];  Service: Orthopedics;  Laterality: Right;  . Rod right leg      There were no vitals filed for this visit.   Subjective Assessment - 06/10/20 1201    Subjective Pt. indicated he was in a facility for about 30 days and he coudn't do his normal HEP due to not having the equipment.  Pt. stated stiffness and reaching/lifting can still be troublesome.    Pertinent History No previous history of injury to Rt shoulder indicated    Diagnostic tests xrays were negative    Patient Stated Goals Reduce pain    Pain Score 3    7/10 at worst   Pain Orientation Right    Pain Descriptors / Indicators Aching;Tightness    Pain Onset More than a month ago    Pain Frequency Intermittent    Aggravating Factors  insidious complaints at times, lying on Rt side, lifting/reaching.    Pain Relieving Factors  stretching    Effect of Pain on Daily Activities Rt UE lifting limitations in daily activity              [facility] PT Assessment - 06/10/20 0001      Assessment   Medical Diagnosis Chronic Rt shoulder pain    Referring Provider (PT) Dr. Prince Rome    Onset Date/Surgical Date 09/16/19    Hand Dominance Right      AROM   Overall AROM Comments ER /IR measured in supine, 45 deg abduction.  flexion/abd measured in supine    Right Shoulder Flexion 165 Degrees    Right Shoulder ABduction 150 Degrees    Right Shoulder Internal Rotation 75 Degrees    Right Shoulder External Rotation 90 Degrees      Strength   Overall Strength Comments painful arc noted in elevation against gravity    Right Shoulder Flexion 3+/5    Right Shoulder ABduction 3+/5    Right Shoulder Internal Rotation 5/5    Right Shoulder External Rotation 4/5      Special Tests   Other special tests (+) empty can, painful arc, shrug sign on Rt                         [facility] Adult PT Treatment/Exercise -  06/10/20 0001      Neuro Re-ed    Neuro Re-ed Details  rhythmic stabilizations 100 deg flexion supine c mild to moderate resistances, 30 sec bouts      Shoulder Exercises: Standing   Other Standing Exercises tband er /ir in neutral blue band 20 x each      Shoulder Exercises: Pulleys   Flexion 3 minutes    ABduction 3 minutes                  PT Education - 06/10/20 1216    Education Details POC update    Person(s) Educated Patient    Methods Explanation    Comprehension Verbalized understanding            PT Short Term Goals - 04/03/20 0815      PT SHORT TERM GOAL #1   Title Patient will demonstrate independent use of home exercise program to maintain progress from in clinic treatments.    Time 2    Period Weeks    Status Achieved    Target Date 04/01/20             PT Long Term Goals - 06/10/20 1207      PT LONG TERM GOAL #1   Title Patient will demonstrate/report pain at  worst less than or equal to 2/10 to facilitate minimal limitation in daily activity secondary to pain symptoms.    Time 6    Period Weeks    Status Revised    Target Date 07/22/20      PT LONG TERM GOAL #2   Title Patient will demonstrate independent use of home exercise program to facilitate ability to maintain/progress functional gains from skilled physical therapy services.    Time 6    Period Weeks    Status Revised    Target Date 07/22/20      PT LONG TERM GOAL #3   Title Pt. will demonstrate Rt GH jt AROM WFL s symptoms to facilitate usual lifting, reaching, self care at [facility].    Time 6    Period Weeks    Status Revised    Target Date 07/22/20      PT LONG TERM GOAL #4   Title Patient will demonstrate Rt UE MMT equal to Lt throughout to facilitate usual lifting, carrying in functional activity to PLOF s limitation.    Time 6    Period Weeks    Status Revised    Target Date 07/22/20      PT LONG TERM GOAL #5   Title Pt. will demonstrate ability to perform work at [employer].    Time 6    Period Weeks    Status Revised    Target Date 07/22/20                 Plan - 06/10/20 1204    Clinical Impression Statement Pt. has attended 7 visits overall during course of treatment.  Pt. adherence to HEP and attendance in clinic has been impacted by unrelated admission to facility for 30 + days (reported by Pt.) in which he was unable to use her personal HEP equipment and was not able to perform as previously discussed.  See objective data for updated information.  Pt. continued to present c Rt shoulder pain c strength and mobility deficits that impair daily activity including lifting, carrying, reaching for daily and recreational use. Pt. may benefit from additional recertification to improve HEP for long term care and progress impairments  towards established goals.    Personal Factors and Comorbidities Comorbidity 1    Comorbidities HTN    Examination-Activity Limitations  Bathing;Carry;Dressing;Lift;Reach Overhead    Stability/Clinical Decision Making Stable/Uncomplicated    Rehab Potential Good    PT Frequency --   1-2x/week   PT Duration 6 weeks    PT Treatment/Interventions ADLs/Self Care Home Management;Cryotherapy;Electrical Stimulation;Iontophoresis 4mg /ml Dexamethasone;Moist Heat;Traction;Balance training;Therapeutic exercise;Therapeutic activities;Functional mobility training;Stair training;Gait training;Ultrasound;Neuromuscular re-education;Patient/family education;Manual techniques;Taping;Vasopneumatic Device;Dry needling;Passive range of motion;Spinal Manipulations;Joint Manipulations    PT Next Visit Plan May benefit from continued skilled PT services for strengthening/symptom reduction    PT Home Exercise Plan [email or web address]    Consulted and Agree with Plan of Care Patient           Patient will benefit from skilled therapeutic intervention in order to improve the following deficits and impairments:  Hypomobility, Decreased activity tolerance, Decreased strength, Impaired UE functional use, Pain, Increased muscle spasms, Decreased mobility, Decreased range of motion, Impaired perceived functional ability, Impaired flexibility, Decreased coordination  Visit Diagnosis: Chronic right shoulder pain  Muscle weakness (generalized)  Abnormal posture     Problem List Patient Active Problem List   Diagnosis Date Noted  . Intertrochanteric fracture of right femur (HCC) 11/11/2014  . Fracture, intertrochanteric, right femur (HCC) 11/11/2014  . Anemia 11/11/2014    11/13/2014, PT, DPT, OCS, ATC 06/10/20  12:18 PM    [facility] Physical Therapy [address] [place], [place], [place] Phone: [phone]   Fax:  [phone]  Name: Luis Marquez MRN: Vikki Ports Date of Birth: 25-Apr-1961

## 2020-06-15 ENCOUNTER — Encounter: Payer: Self-pay | Admitting: Rehabilitative and Restorative Service Providers"

## 2020-06-15 ENCOUNTER — Ambulatory Visit (INDEPENDENT_AMBULATORY_CARE_PROVIDER_SITE_OTHER): Payer: Self-pay | Admitting: Rehabilitative and Restorative Service Providers"

## 2020-06-15 ENCOUNTER — Other Ambulatory Visit: Payer: Self-pay

## 2020-06-15 DIAGNOSIS — M25511 Pain in right shoulder: Secondary | ICD-10-CM

## 2020-06-15 DIAGNOSIS — M6281 Muscle weakness (generalized): Secondary | ICD-10-CM

## 2020-06-15 DIAGNOSIS — R293 Abnormal posture: Secondary | ICD-10-CM

## 2020-06-15 DIAGNOSIS — G8929 Other chronic pain: Secondary | ICD-10-CM

## 2020-06-15 NOTE — Therapy (Signed)
[facility] Physical Therapy [address] [place], [place], [address] Phone: [phone]   Fax:  [phone]  Physical Therapy Treatment  Patient Details  Name: Luis Marquez MRN: [id number] Date of Birth: 04/04/1961 Referring Provider (PT): Dr. Prince Rome   Encounter Date: 06/15/2020   PT End of Session - 06/15/20 1533    Visit Number 8    Number of Visits 14    Date for PT Re-Evaluation 07/22/20    Progress Note Due on Visit 14    PT Start Time 1525    PT Stop Time 1555    PT Time Calculation (min) 30 min    Activity Tolerance Patient tolerated treatment well    Behavior During Therapy [facility] for tasks assessed/performed           Past Medical History:  Diagnosis Date   Hypertension     Past Surgical History:  Procedure Laterality Date   HERNIA REPAIR     INTRAMEDULLARY (IM) NAIL INTERTROCHANTERIC Right 11/11/2014   Procedure: INTRAMEDULLARY (IM) NAIL INTERTROCHANTRIC;  Surgeon: Durene Romans, MD;  Location: [facility];  Service: Orthopedics;  Laterality: Right;   Rod right leg      There were no vitals filed for this visit.                      [facility] Adult PT Treatment/Exercise - 06/15/20 0001      Shoulder Exercises: Standing   Other Standing Exercises tband er /ir in neutral green band 3 x 15 (endurance focus), wall push ups c SA press 20x    Other Standing Exercises 90 deg circles cw, ccw 2 x 30 each 2 lb ball      Shoulder Exercises: ROM/Strengthening   UBE (Upper Arm Bike) Lvl 3 3 mins fwd/back each way                    PT Short Term Goals - 04/03/20 0815      PT SHORT TERM GOAL #1   Title Patient will demonstrate independent use of home exercise program to maintain progress from in clinic treatments.    Time 2    Period Weeks    Status Achieved    Target Date 04/01/20             PT Long Term Goals - 06/10/20 1207      PT LONG TERM GOAL #1   Title Patient will demonstrate/report pain at worst less  than or equal to 2/10 to facilitate minimal limitation in daily activity secondary to pain symptoms.    Time 6    Period Weeks    Status Revised    Target Date 07/22/20      PT LONG TERM GOAL #2   Title Patient will demonstrate independent use of home exercise program to facilitate ability to maintain/progress functional gains from skilled physical therapy services.    Time 6    Period Weeks    Status Revised    Target Date 07/22/20      PT LONG TERM GOAL #3   Title Pt. will demonstrate Rt GH jt AROM WFL s symptoms to facilitate usual lifting, reaching, self care at [facility].    Time 6    Period Weeks    Status Revised    Target Date 07/22/20      PT LONG TERM GOAL #4   Title Patient will demonstrate Rt UE MMT equal to Lt throughout to facilitate usual lifting, carrying in functional  activity to PLOF s limitation.    Time 6    Period Weeks    Status Revised    Target Date 07/22/20      PT LONG TERM GOAL #5   Title Pt. will demonstrate ability to perform work at [employer].    Time 6    Period Weeks    Status Revised    Target Date 07/22/20                 Plan - 06/15/20 1535    Clinical Impression Statement Arrival late for visit impacted treatment time.  Evidence of compensatory upper trap activation in elevation with increased tightness/irritation in area reported and noted.    Personal Factors and Comorbidities Comorbidity 1    Comorbidities HTN    Examination-Activity Limitations Bathing;Carry;Dressing;Lift;Reach Overhead    Stability/Clinical Decision Making Stable/Uncomplicated    Rehab Potential Good    PT Frequency --   1-2x/week   PT Duration 6 weeks    PT Treatment/Interventions ADLs/Self Care Home Management;Cryotherapy;Electrical Stimulation;Iontophoresis 4mg /ml Dexamethasone;Moist Heat;Traction;Balance training;Therapeutic exercise;Therapeutic activities;Functional mobility training;Stair training;Gait training;Ultrasound;Neuromuscular  re-education;Patient/family education;Manual techniques;Taping;Vasopneumatic Device;Dry needling;Passive range of motion;Spinal Manipulations;Joint Manipulations    PT Next Visit Plan May benefit from continued skilled PT services for strengthening/symptom reduction    PT Home Exercise Plan [email or web address]    Consulted and Agree with Plan of Care Patient           Patient will benefit from skilled therapeutic intervention in order to improve the following deficits and impairments:  Hypomobility, Decreased activity tolerance, Decreased strength, Impaired UE functional use, Pain, Increased muscle spasms, Decreased mobility, Decreased range of motion, Impaired perceived functional ability, Impaired flexibility, Decreased coordination  Visit Diagnosis: Chronic right shoulder pain  Muscle weakness (generalized)  Abnormal posture     Problem List Patient Active Problem List   Diagnosis Date Noted   Intertrochanteric fracture of right femur (HCC) 11/11/2014   Fracture, intertrochanteric, right femur (HCC) 11/11/2014   Anemia 11/11/2014    11/13/2014 06/15/2020, 3:52 PM  [facility] Physical Therapy [address] [place], [place], [place] Phone: [phone]   Fax:  [phone]  Name: Luis Marquez MRN: Vikki Ports Date of Birth: 1961-07-07

## 2020-06-22 ENCOUNTER — Encounter: Payer: Self-pay | Admitting: Rehabilitative and Restorative Service Providers"

## 2020-06-22 ENCOUNTER — Other Ambulatory Visit: Payer: Self-pay

## 2020-06-22 ENCOUNTER — Ambulatory Visit (INDEPENDENT_AMBULATORY_CARE_PROVIDER_SITE_OTHER): Payer: Self-pay | Admitting: Rehabilitative and Restorative Service Providers"

## 2020-06-22 DIAGNOSIS — M6281 Muscle weakness (generalized): Secondary | ICD-10-CM

## 2020-06-22 DIAGNOSIS — G8929 Other chronic pain: Secondary | ICD-10-CM

## 2020-06-22 DIAGNOSIS — M25511 Pain in right shoulder: Secondary | ICD-10-CM

## 2020-06-22 DIAGNOSIS — R293 Abnormal posture: Secondary | ICD-10-CM

## 2020-06-22 NOTE — Therapy (Addendum)
[facility] [address] [place], [place], [address] Phone: [phone]   Fax:  [phone]  Physical Therapy Treatment/Discharge  Patient Details  Name: Luis Marquez MRN: [id number] Date of Birth: 1960/08/12 Referring Provider (PT): Dr. Junius Roads   Encounter Date: 06/22/2020   PT End of Session - 06/22/20 1520    Visit Number 9    Number of Visits 14    Date for PT Re-Evaluation 07/22/20    Progress Note Due on Visit 14    PT Start Time 7253    PT Stop Time 1554    PT Time Calculation (min) 39 min    Activity Tolerance Patient tolerated treatment well    Behavior During Therapy [facility] for tasks assessed/performed           Past Medical History:  Diagnosis Date  . Hypertension     Past Surgical History:  Procedure Laterality Date  . HERNIA REPAIR    . INTRAMEDULLARY (IM) NAIL INTERTROCHANTERIC Right 11/11/2014   Procedure: INTRAMEDULLARY (IM) NAIL INTERTROCHANTRIC;  Surgeon: Paralee Cancel, MD;  Location: [facility];  Service: Orthopedics;  Laterality: Right;  . Rod right leg      There were no vitals filed for this visit.   Subjective Assessment - 06/22/20 1518    Subjective Pt. indicated feeling pretty well overall, feeling like he is getting some gains in movement.    Pertinent History No previous history of injury to Rt shoulder indicated    Diagnostic tests xrays were negative    Patient Stated Goals Reduce pain    Currently in Pain? No/denies    Pain Score --   no pain at rest   Pain Onset More than a month ago                             [facility] Adult PT Treatment/Exercise - 06/22/20 0001      Shoulder Exercises: Standing   Flexion Both   3 x 10 c 3 lbs wand   Shoulder Flexion Weight (lbs) 3    Extension 20 reps;Both   3 lb wand   Other Standing Exercises 90 deg circles cw, ccw 2 x 30 each 2 lb ball      Shoulder Exercises: Pulleys   Flexion 2 minutes    ABduction 2 minutes      Shoulder Exercises:  ROM/Strengthening   UBE (Upper Arm Bike) Lvl 3.5 3 mins fwd/back each way    Other ROM/Strengthening Exercises rows, lat pull down machine 3 x 10 20 lbs, chest press 3 x 10  0 lbs                    PT Short Term Goals - 04/03/20 0815      PT SHORT TERM GOAL #1   Title Patient will demonstrate independent use of home exercise program to maintain progress from in clinic treatments.    Time 2    Period Weeks    Status Achieved    Target Date 04/01/20             PT Long Term Goals - 06/10/20 1207      PT LONG TERM GOAL #1   Title Patient will demonstrate/report pain at worst less than or equal to 2/10 to facilitate minimal limitation in daily activity secondary to pain symptoms.    Time 6    Period Weeks    Status Revised    Target  Date 07/22/20      PT LONG TERM GOAL #2   Title Patient will demonstrate independent use of home exercise program to facilitate ability to maintain/progress functional gains from skilled physical therapy services.    Time 6    Period Weeks    Status Revised    Target Date 07/22/20      PT LONG TERM GOAL #3   Title Pt. will demonstrate Rt [place] jt AROM WFL s symptoms to facilitate usual lifting, reaching, self care at [facility].    Time 6    Period Weeks    Status Revised    Target Date 07/22/20      PT LONG TERM GOAL #4   Title Patient will demonstrate Rt UE MMT equal to Lt throughout to facilitate usual lifting, carrying in functional activity to PLOF s limitation.    Time 6    Period Weeks    Status Revised    Target Date 07/22/20      PT LONG TERM GOAL #5   Title Pt. will demonstrate ability to perform work at [employer].    Time 6    Period Weeks    Status Revised    Target Date 07/22/20                 Plan - 06/22/20 1547    Clinical Impression Statement Elevation attempts improving past 90 deg prior to shrug.  Onset of shrug compensatory movements around 115 deg today.  Continued strengthening plan indicated for continued  gians.    Personal Factors and Comorbidities Comorbidity 1    Comorbidities HTN    Examination-Activity Limitations Bathing;Carry;Dressing;Lift;Reach Overhead    Stability/Clinical Decision Making Stable/Uncomplicated    Rehab Potential Good    PT Frequency --   1-2x/week   PT Duration 6 weeks    PT Treatment/Interventions ADLs/Self Care Home Management;Cryotherapy;Electrical Stimulation;Iontophoresis 38m/ml Dexamethasone;Moist Heat;Traction;Balance training;Therapeutic exercise;Therapeutic activities;Functional mobility training;Stair training;Gait training;Ultrasound;Neuromuscular re-education;Patient/family education;Manual techniques;Taping;Vasopneumatic Device;Dry needling;Passive range of motion;Spinal Manipulations;Joint Manipulations    PT Next Visit Plan May benefit from continued skilled PT services for strengthening/symptom reduction    PT Home Exercise Plan [email or web address]    Consulted and Agree with Plan of Care Patient           Patient will benefit from skilled therapeutic intervention in order to improve the following deficits and impairments:  Hypomobility, Decreased activity tolerance, Decreased strength, Impaired UE functional use, Pain, Increased muscle spasms, Decreased mobility, Decreased range of motion, Impaired perceived functional ability, Impaired flexibility, Decreased coordination  Visit Diagnosis: Chronic right shoulder pain  Muscle weakness (generalized)  Abnormal posture     Problem List Patient Active Problem List   Diagnosis Date Noted  . Intertrochanteric fracture of right femur ([place] 11/11/2014  . Fracture, intertrochanteric, right femur ([place] 11/11/2014  . Anemia 11/11/2014    MScot Jun PT, DPT, OCS, ATC 06/22/20  3:49 PM  PHYSICAL THERAPY DISCHARGE SUMMARY  Visits from Start of Care: 9  Current functional level related to goals / functional outcomes: See note   Remaining deficits: See note   Education /  Equipment: HEP Plan: Patient agrees to discharge.  Patient goals were partially met. Patient is being discharged due to not returning since the last visit.  ?????    MScot Jun PT, DPT, OCS, ATC 09/01/20  9:03 AM     [facility] [address] [place] [address]Phone: [phone]  Fax:  [phone] Name: GIoan LandiniMRN: [id number]  of Birth: 1960/10/21

## 2020-06-30 ENCOUNTER — Encounter: Payer: Self-pay | Admitting: Rehabilitative and Restorative Service Providers"

## 2020-07-07 ENCOUNTER — Encounter: Payer: Self-pay | Admitting: Rehabilitative and Restorative Service Providers"

## 2020-07-08 ENCOUNTER — Ambulatory Visit: Payer: Self-pay | Attending: Nurse Practitioner | Admitting: Nurse Practitioner

## 2020-07-08 ENCOUNTER — Other Ambulatory Visit: Payer: Self-pay

## 2020-07-08 ENCOUNTER — Encounter: Payer: Self-pay | Admitting: Nurse Practitioner

## 2020-07-08 DIAGNOSIS — I10 Essential (primary) hypertension: Secondary | ICD-10-CM

## 2020-07-08 NOTE — Progress Notes (Signed)
Virtual Visit via Telephone Note Due to national recommendations of social distancing due to COVID 19, telehealth visit is felt to be most appropriate for this patient at this time.  I discussed the limitations, risks, security and privacy concerns of performing an evaluation and management service by telephone and the availability of in person appointments. I also discussed with the patient that there may be a patient responsible charge related to this service. The patient expressed understanding and agreed to proceed.    I connected with Luis Marquez on 07/08/20  at   3:30 PM EST  EDT by telephone and verified that I am speaking with the correct person using two identifiers.   Consent I discussed the limitations, risks, security and privacy concerns of performing an evaluation and management service by telephone and the availability of in person appointments. I also discussed with the patient that there may be a patient responsible charge related to this service. The patient expressed understanding and agreed to proceed.   Location of Patient: Private Residence   Location of Provider: [facility]    Persons participating in Telemedicine visit: Bertram Denver FNP-BC [facility] CMA Luis Marquez    History of Present Illness: Telemedicine visit for: Follow up  has a past medical history of Hypertension.  Essential Hypertension Well controlled. He is monitoring his blood pressure 2times per day with average readings 128/60-70s. Taking amlodipine 10 mg daily and lisinopril 20 mg daily as prescribed. Denies chest pain, shortness of breath, palpitations, lightheadedness, dizziness, headaches or BLE edema. He takes furosemide as needed for intermittent BLE edema. He continues to smoke. I have encouraged him to quit. He states he is cutting back.  BP Readings from Last 3 Encounters:  04/08/20 137/76  11/12/19 (!) 142/85  06/27/19 (!) 158/78    Lab Results   Component Value Date   HGBA1C 5.4 03/04/2020       Past Medical History:  Diagnosis Date   Hypertension     Past Surgical History:  Procedure Laterality Date   HERNIA REPAIR     INTRAMEDULLARY (IM) NAIL INTERTROCHANTERIC Right 11/11/2014   Procedure: INTRAMEDULLARY (IM) NAIL INTERTROCHANTRIC;  Surgeon: Durene Romans, MD;  Location: [facility];  Service: Orthopedics;  Laterality: Right;   Rod right leg      Family History  Problem Relation Age of Onset   Diabetes Mother     Social History   Socioeconomic History   Marital status: Single    Spouse name: Not on file   Number of children: Not on file   Years of education: Not on file   Highest education level: Not on file  Occupational History   Not on file  Tobacco Use   Smoking status: Current Every Day Smoker    Packs/day: 0.25    Types: Cigarettes   Smokeless tobacco: Never Used  Substance and Sexual Activity   Alcohol use: Not Currently   Drug use: Not Currently   Sexual activity: Yes  Other Topics Concern   Not on file  Social History Narrative   ** Merged History Encounter **       Social Determinants of Health   Financial Resource Strain: Not on file  Food Insecurity: Not on file  Transportation Needs: Not on file  Physical Activity: Not on file  Stress: Not on file  Social Connections: Not on file     Observations/Objective: Awake, alert and oriented x 3   Review of Systems  Constitutional: Negative for fever,  malaise/fatigue and weight loss.  HENT: Negative.  Negative for nosebleeds.   Eyes: Negative.  Negative for blurred vision, double vision and photophobia.  Respiratory: Negative.  Negative for cough and shortness of breath.   Cardiovascular: Negative.  Negative for chest pain, palpitations and leg swelling.  Gastrointestinal: Negative.  Negative for heartburn, nausea and vomiting.  Musculoskeletal: Negative.  Negative for myalgias.  Neurological: Negative.  Negative for  dizziness, focal weakness, seizures and headaches.  Psychiatric/Behavioral: Negative.  Negative for suicidal ideas.    Assessment and Plan: Matteo was seen today for follow-up.  Diagnoses and all orders for this visit:  Essential hypertension Continue all antihypertensives as prescribed.  Remember to bring in your blood pressure log with you for your follow up appointment.  DASH/Mediterranean Diets are healthier choices for HTN.     Follow Up Instructions Return in about 3 months (around 10/06/2020).     I discussed the assessment and treatment plan with the patient. The patient was provided an opportunity to ask questions and all were answered. The patient agreed with the plan and demonstrated an understanding of the instructions.   The patient was advised to call back or seek an in-person evaluation if the symptoms worsen or if the condition fails to improve as anticipated.  I provided 15 minutes of non-face-to-face time during this encounter including median intraservice time, reviewing previous notes, labs, imaging, medications and explaining diagnosis and management.  Claiborne Rigg, FNP-BC

## 2020-07-10 ENCOUNTER — Other Ambulatory Visit: Payer: Self-pay | Admitting: Nurse Practitioner

## 2020-07-10 DIAGNOSIS — Z1211 Encounter for screening for malignant neoplasm of colon: Secondary | ICD-10-CM

## 2020-07-10 DIAGNOSIS — I10 Essential (primary) hypertension: Secondary | ICD-10-CM

## 2020-07-15 ENCOUNTER — Other Ambulatory Visit: Payer: Self-pay

## 2020-07-15 ENCOUNTER — Encounter: Payer: Self-pay | Admitting: Rehabilitative and Restorative Service Providers"

## 2020-07-15 ENCOUNTER — Ambulatory Visit: Payer: Self-pay | Attending: Nurse Practitioner

## 2020-07-15 DIAGNOSIS — I10 Essential (primary) hypertension: Secondary | ICD-10-CM

## 2020-07-16 LAB — CMP14+EGFR
ALT: 14 IU/L (ref 0–44)
AST: 23 IU/L (ref 0–40)
Albumin/Globulin Ratio: 1.8 (ref 1.2–2.2)
Albumin: 4.4 g/dL (ref 3.8–4.8)
Alkaline Phosphatase: 83 IU/L (ref 44–121)
BUN/Creatinine Ratio: 13 (ref 10–24)
BUN: 10 mg/dL (ref 8–27)
Bilirubin Total: 0.8 mg/dL (ref 0.0–1.2)
CO2: 27 mmol/L (ref 20–29)
Calcium: 9.9 mg/dL (ref 8.6–10.2)
Chloride: 101 mmol/L (ref 96–106)
Creatinine, Ser: 0.76 mg/dL (ref 0.76–1.27)
GFR calc Af Amer: 114 mL/min/{1.73_m2} (ref >59)
GFR calc non Af Amer: 98 mL/min/{1.73_m2} (ref >59)
Globulin, Total: 2.4 g/dL (ref 1.5–4.5)
Glucose: 92 mg/dL (ref 65–99)
Potassium: 3.7 mmol/L (ref 3.5–5.2)
Sodium: 145 mmol/L — ABNORMAL HIGH (ref 134–144)
Total Protein: 6.8 g/dL (ref 6.0–8.5)

## 2020-07-24 ENCOUNTER — Telehealth: Payer: Self-pay | Admitting: Nurse Practitioner

## 2020-07-24 ENCOUNTER — Ambulatory Visit: Payer: Self-pay

## 2020-07-24 NOTE — Telephone Encounter (Signed)
Patient is calling to reschedule his CAFA application for today. Please advise CB- [phone]

## 2020-07-24 NOTE — Telephone Encounter (Signed)
I return Pt call, he was reschedule for 08/07/20

## 2020-08-06 MED FILL — LISINOPRIL 20 MG TABLET: 20 | 30 days supply | Qty: 30 | Fill #1

## 2020-08-06 MED FILL — AMLODIPINE BESYLATE 10 MG T: 10 | 30 days supply | Qty: 30 | Fill #2

## 2020-08-07 ENCOUNTER — Ambulatory Visit: Payer: Self-pay

## 2020-08-14 ENCOUNTER — Other Ambulatory Visit: Payer: Self-pay

## 2020-08-14 ENCOUNTER — Ambulatory Visit: Payer: Self-pay | Attending: Nurse Practitioner

## 2020-08-21 ENCOUNTER — Telehealth: Payer: Self-pay | Admitting: Nurse Practitioner

## 2020-08-21 NOTE — Telephone Encounter (Signed)
I call Pt LVM explain him that I received an email from [employer], Patient applying for financial assistance.  Application scanned to account [id number].  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  Since patient is married, they would need to provide spouse's income information including  tax return for spouse or non-filing letter for spouse.  If patient is legally  separated or divorced, we would need documentation showing legal separation or divorce to complete cafa process.  Will hold application for 14 days for patient to provide requested information.

## 2020-09-10 ENCOUNTER — Other Ambulatory Visit: Payer: Self-pay | Admitting: Nurse Practitioner

## 2020-09-10 DIAGNOSIS — F17200 Nicotine dependence, unspecified, uncomplicated: Secondary | ICD-10-CM

## 2020-09-10 DIAGNOSIS — J449 Chronic obstructive pulmonary disease, unspecified: Secondary | ICD-10-CM

## 2020-09-10 MED FILL — AMLODIPINE BESYLATE 10 MG T: 10 | 30 days supply | Qty: 30 | Fill #3

## 2020-09-10 MED FILL — $VENTOLIN HFA 18G INHALER: 108 (90 BAS | 25 days supply | Qty: 18 | Fill #0

## 2020-09-10 MED FILL — $BREO ELLIPTA 200-25 MCG IN: 200-25 | 30 days supply | Qty: 60 | Fill #3

## 2020-09-10 MED FILL — LISINOPRIL 20 MG TABLET: 20 | 30 days supply | Qty: 30 | Fill #2

## 2020-09-14 ENCOUNTER — Other Ambulatory Visit: Payer: Self-pay

## 2020-09-14 ENCOUNTER — Encounter: Payer: Self-pay | Admitting: Nurse Practitioner

## 2020-09-14 ENCOUNTER — Ambulatory Visit: Payer: Self-pay | Attending: Nurse Practitioner | Admitting: Nurse Practitioner

## 2020-09-14 ENCOUNTER — Other Ambulatory Visit: Payer: Self-pay | Admitting: Nurse Practitioner

## 2020-09-14 VITALS — BP 148/77 | HR 87 | Ht 73.0 in | Wt 211.6 lb

## 2020-09-14 DIAGNOSIS — J449 Chronic obstructive pulmonary disease, unspecified: Secondary | ICD-10-CM

## 2020-09-14 DIAGNOSIS — E870 Hyperosmolality and hypernatremia: Secondary | ICD-10-CM

## 2020-09-14 DIAGNOSIS — I10 Essential (primary) hypertension: Secondary | ICD-10-CM

## 2020-09-14 MED ORDER — LISINOPRIL 20 MG PO TABS: 20.0000 mg | ORAL_TABLET | Freq: Every day | ORAL | 1 refills | Status: DC

## 2020-09-14 MED ORDER — AMLODIPINE BESYLATE 10 MG PO TABS: 10.0000 mg | ORAL_TABLET | Freq: Every day | ORAL | 3 refills | Status: DC

## 2020-09-14 MED ORDER — BREO ELLIPTA 200-25 MCG/INH IN AEPB: 1.0000 | INHALATION_SPRAY | Freq: Every day | RESPIRATORY_TRACT | 3 refills | Status: DC

## 2020-09-14 NOTE — Progress Notes (Signed)
Assessment & Plan:  Luis Marquez was seen today for hypertension.  Diagnoses and all orders for this visit:  Essential hypertension -     lisinopril (ZESTRIL) 20 MG tablet; Take 1 tablet (20 mg total) by mouth daily. -     amLODipine (NORVASC) 10 MG tablet; Take 1 tablet (10 mg total) by mouth daily. Continue all antihypertensives as prescribed.  Remember to bring in your blood pressure log with you for your follow up appointment.  DASH/Mediterranean Diets are healthier choices for HTN.    Chronic obstructive pulmonary disease, unspecified COPD type (HCC) -     fluticasone furoate-vilanterol (BREO ELLIPTA) 200-25 MCG/INH AEPB; Inhale 1 puff into the lungs daily. Please fill as a 90 day supply  Hypernatremia -     Basic metabolic panel    Patient has been counseled on age-appropriate routine health concerns for screening and prevention. These are reviewed and up-to-date. Referrals have been placed accordingly. Immunizations are up-to-date or declined.    Subjective:   Chief Complaint  Patient presents with  . Hypertension   [name] 60 y.o. male presents to office today for follow up.  has a past medical history of Hypertension.   Essential Hypertension Bleed pressure is well controlled. He is taking amlodipine 10 mg daily and lisinopril 20 mg daily. States blood pressure readings at home averaging 120-130/70s. Denies chest pain, shortness of breath, palpitations, lightheadedness, dizziness, headaches or BLE edema.  BP Readings from Last 3 Encounters:  09/14/20 (!) 148/77  04/08/20 137/76  11/12/19 (!) 142/85   COPD Symptoms are well controlled. He is taking BREO daily with infrequent use of his prn ventolin inhaler.      Review of Systems  Constitutional: Negative for fever, malaise/fatigue and weight loss.  HENT: Negative.  Negative for nosebleeds.   Eyes: Negative.  Negative for blurred vision, double vision and photophobia.  Respiratory: Negative.  Negative  for cough and shortness of breath.   Cardiovascular: Negative.  Negative for chest pain, palpitations and leg swelling.  Gastrointestinal: Negative.  Negative for heartburn, nausea and vomiting.  Musculoskeletal: Negative.  Negative for myalgias.  Neurological: Negative.  Negative for dizziness, focal weakness, seizures and headaches.  Psychiatric/Behavioral: Negative.  Negative for suicidal ideas.    Past Medical History:  Diagnosis Date  . Hypertension     Past Surgical History:  Procedure Laterality Date  . HERNIA REPAIR    . INTRAMEDULLARY (IM) NAIL INTERTROCHANTERIC Right 11/11/2014   Procedure: INTRAMEDULLARY (IM) NAIL INTERTROCHANTRIC;  Surgeon: Durene Romans, MD;  Location: [facility];  Service: Orthopedics;  Laterality: Right;  . Rod right leg      Family History  Problem Relation Age of Onset  . Diabetes Mother     Social History Reviewed with no changes to be made today.   Outpatient Medications Prior to Visit  Medication Sig Dispense Refill  . GLUCOS-CHONDROIT-MSM-C-HYAL PO Take 1 tablet by mouth daily.    . Multiple Vitamin (MULTIVITAMIN WITH MINERALS) TABS tablet Take 1 tablet by mouth daily.    . VENTOLIN HFA 108 (90 Base) MCG/ACT inhaler INHALE 2 PUFFS INTO THE LUNGS EVERY 6 (SIX) HOURS AS NEEDED FOR WHEEZING OR SHORTNESS OF BREATH (COUGH). 18 g 0  . amLODipine (NORVASC) 10 MG tablet Take 1 tablet (10 mg total) by mouth daily. 90 tablet 3  . BREO ELLIPTA 200-25 MCG/INH AEPB INHALE 1 PUFF INTO THE LUNGS DAILY. 180 each 0  . ferrous sulfate 325 (65 FE) MG tablet Take 1 tablet (325  mg total) by mouth 2 (two) times daily with a meal. 180 tablet 3  . furosemide (LASIX) 20 MG tablet Take 1 tablet (20 mg total) by mouth daily as needed. 30 tablet 3  . lisinopril (ZESTRIL) 20 MG tablet Take 1 tablet (20 mg total) by mouth daily. 90 tablet 1  . nabumetone (RELAFEN) 750 MG tablet Take 1 tablet (750 mg total) by mouth 2 (two) times daily as needed. (Patient not taking: Reported  on 09/14/2020) 60 tablet 6   No facility-administered medications prior to visit.    No Known Allergies     Objective:    BP (!) 148/77   Pulse 87   Ht 6\' 1"  (1.854 m)   Wt 211 lb 9.6 oz (96 kg)   SpO2 95%   BMI 27.92 kg/m  Wt Readings from Last 3 Encounters:  09/14/20 211 lb 9.6 oz (96 kg)  04/08/20 212 lb (96.2 kg)  11/12/19 219 lb 6.4 oz (99.5 kg)    Physical Exam Vitals and nursing note reviewed.  Constitutional:      Appearance: He is well-developed and well-nourished.  HENT:     Head: Normocephalic and atraumatic.  Eyes:     Extraocular Movements: EOM normal.  Cardiovascular:     Rate and Rhythm: Normal rate and regular rhythm.     Pulses: Intact distal pulses.     Heart sounds: Normal heart sounds. No murmur heard. No friction rub. No gallop.   Pulmonary:     Effort: Pulmonary effort is normal. No tachypnea or respiratory distress.     Breath sounds: Normal breath sounds. No decreased breath sounds, wheezing, rhonchi or rales.  Chest:     Chest wall: No tenderness.  Abdominal:     General: Bowel sounds are normal.     Palpations: Abdomen is soft.  Musculoskeletal:        General: No edema. Normal range of motion.     Cervical back: Normal range of motion.  Skin:    General: Skin is warm and dry.  Neurological:     Mental Status: He is alert and oriented to person, place, and time.     Coordination: Coordination normal.  Psychiatric:        Mood and Affect: Mood and affect normal.        Behavior: Behavior normal. Behavior is cooperative.        Thought Content: Thought content normal.        Judgment: Judgment normal.          Patient has been counseled extensively about nutrition and exercise as well as the importance of adherence with medications and regular follow-up. The patient was given clear instructions to go to ER or return to medical center if symptoms don't improve, worsen or new problems develop. The patient verbalized understanding.    Follow-up: Return in about 3 months (around 12/12/2020).   12/14/2020, FNP-BC [facility] [place], [place]   09/14/2020, 6:25 PM

## 2020-09-15 LAB — BASIC METABOLIC PANEL
BUN/Creatinine Ratio: 15 (ref 10–24)
BUN: 9 mg/dL (ref 8–27)
CO2: 22 mmol/L (ref 20–29)
Calcium: 9.3 mg/dL (ref 8.6–10.2)
Chloride: 99 mmol/L (ref 96–106)
Creatinine, Ser: 0.6 mg/dL — ABNORMAL LOW (ref 0.76–1.27)
Glucose: 90 mg/dL (ref 65–99)
Potassium: 3.8 mmol/L (ref 3.5–5.2)
Sodium: 141 mmol/L (ref 134–144)
eGFR: 109 mL/min/{1.73_m2} (ref >59)

## 2020-10-15 ENCOUNTER — Other Ambulatory Visit: Payer: Self-pay | Admitting: Nurse Practitioner

## 2020-10-15 DIAGNOSIS — F17200 Nicotine dependence, unspecified, uncomplicated: Secondary | ICD-10-CM

## 2020-10-17 ENCOUNTER — Other Ambulatory Visit: Payer: Self-pay

## 2020-10-17 MED FILL — Amlodipine Besylate Tab 10 MG (Base Equivalent): ORAL | 30 days supply | Qty: 30 | Fill #0 | Status: AC

## 2020-10-17 MED FILL — Fluticasone Furoate-Vilanterol Aero Powd BA 200-25 MCG/ACT: RESPIRATORY_TRACT | 90 days supply | Qty: 3 | Fill #0 | Status: AC

## 2020-10-17 MED FILL — Lisinopril Tab 20 MG: ORAL | 30 days supply | Qty: 30 | Fill #0 | Status: AC

## 2020-10-18 ENCOUNTER — Other Ambulatory Visit: Payer: Self-pay

## 2020-10-18 MED FILL — Albuterol Sulfate Inhal Aero 108 MCG/ACT (90MCG Base Equiv): RESPIRATORY_TRACT | 25 days supply | Qty: 18 | Fill #0 | Status: AC

## 2020-10-19 ENCOUNTER — Other Ambulatory Visit: Payer: Self-pay

## 2020-11-27 ENCOUNTER — Other Ambulatory Visit: Payer: Self-pay | Admitting: Nurse Practitioner

## 2020-11-27 ENCOUNTER — Other Ambulatory Visit: Payer: Self-pay

## 2020-11-27 DIAGNOSIS — F17200 Nicotine dependence, unspecified, uncomplicated: Secondary | ICD-10-CM

## 2020-11-27 MED ORDER — ALBUTEROL SULFATE HFA 108 (90 BASE) MCG/ACT IN AERS
INHALATION_SPRAY | RESPIRATORY_TRACT | 0 refills | Status: DC
  Filled 2020-11-27: qty 18, 25d supply, fill #0

## 2020-11-27 MED FILL — Lisinopril Tab 20 MG: ORAL | 30 days supply | Qty: 30 | Fill #1 | Status: AC

## 2020-11-27 MED FILL — Amlodipine Besylate Tab 10 MG (Base Equivalent): ORAL | 30 days supply | Qty: 30 | Fill #1 | Status: AC

## 2020-11-30 ENCOUNTER — Other Ambulatory Visit: Payer: Self-pay

## 2020-12-03 ENCOUNTER — Other Ambulatory Visit: Payer: Self-pay

## 2020-12-15 ENCOUNTER — Ambulatory Visit: Payer: Self-pay | Admitting: Nurse Practitioner

## 2020-12-30 ENCOUNTER — Other Ambulatory Visit: Payer: Self-pay

## 2020-12-30 ENCOUNTER — Other Ambulatory Visit: Payer: Self-pay | Admitting: Nurse Practitioner

## 2020-12-30 DIAGNOSIS — F17200 Nicotine dependence, unspecified, uncomplicated: Secondary | ICD-10-CM

## 2020-12-30 MED ORDER — ALBUTEROL SULFATE HFA 108 (90 BASE) MCG/ACT IN AERS
INHALATION_SPRAY | RESPIRATORY_TRACT | 0 refills | Status: DC
  Filled 2020-12-30: qty 18, 25d supply, fill #0

## 2020-12-30 MED FILL — Lisinopril Tab 20 MG: ORAL | 30 days supply | Qty: 30 | Fill #2 | Status: AC

## 2020-12-30 MED FILL — Amlodipine Besylate Tab 10 MG (Base Equivalent): ORAL | 30 days supply | Qty: 30 | Fill #2 | Status: AC

## 2020-12-31 ENCOUNTER — Other Ambulatory Visit: Payer: Self-pay

## 2021-01-06 ENCOUNTER — Telehealth: Payer: Self-pay | Admitting: Nurse Practitioner

## 2021-01-06 NOTE — Telephone Encounter (Signed)
Patient has made an additional call regarding this matter  Patient would like to be prescribed something to help with the discomfort if an appointment is unavailable to them  Please contact to further when possible.

## 2021-01-06 NOTE — Telephone Encounter (Signed)
Copied from CRM [id number]. Topic: General - Other >> Jan 06, 2021 11:26 AM Tamela Oddi wrote: Reason for CRM: Patient called to ask the nurse or doctor to call him in some medication for his cold.  He stated that the doctor does not have any appts. Soon so he would like some medication called in to help with the cold.  CB# [phone]

## 2021-01-07 ENCOUNTER — Other Ambulatory Visit: Payer: Self-pay

## 2021-01-07 ENCOUNTER — Ambulatory Visit (HOSPITAL_COMMUNITY)
Admission: EM | Admit: 2021-01-07 | Discharge: 2021-01-07 | Disposition: A | Discharge status: Home / Self Care | Payer: Self-pay

## 2021-01-07 ENCOUNTER — Ambulatory Visit (INDEPENDENT_AMBULATORY_CARE_PROVIDER_SITE_OTHER): Payer: Self-pay

## 2021-01-07 ENCOUNTER — Encounter (HOSPITAL_COMMUNITY): Payer: Self-pay

## 2021-01-07 DIAGNOSIS — J441 Chronic obstructive pulmonary disease with (acute) exacerbation: Secondary | ICD-10-CM

## 2021-01-07 DIAGNOSIS — R059 Cough, unspecified: Secondary | ICD-10-CM

## 2021-01-07 MED ORDER — DOXYCYCLINE HYCLATE 100 MG PO CAPS
100.0000 mg | ORAL_CAPSULE | Freq: Two times a day (BID) | ORAL | 0 refills | Status: DC
  Filled 2021-01-07: qty 14, 7d supply, fill #0

## 2021-01-07 MED ORDER — PREDNISONE 10 MG PO TABS
ORAL_TABLET | Freq: Every day | ORAL | 0 refills | Status: AC
  Filled 2021-01-07: qty 21, 6d supply, fill #0

## 2021-01-07 NOTE — Telephone Encounter (Signed)
Called patient to f/u with request for cold medication Patient reports he is currently in the Urgent Care.

## 2021-01-07 NOTE — ED Triage Notes (Signed)
Pt reports shortness of breath, nasal congestion, chest congestion x 2 weeks. DayQuil and NyQuill gives no relief.

## 2021-01-07 NOTE — Discharge Instructions (Addendum)
I have sent in doxycycline for you to take one tablet twice a day for 7 days  Xray was negative in office for pneumonia  I have sent in a prednisone taper for you to take for 6 days. 6 tablets on day one, 5 tablets on day two, 4 tablets on day three, 3 tablets on day four, 2 tablets on day five, and 1 tablet on day six.  Follow up with this office or with primary care if symptoms are persisting.  Follow up in the ER for high fever, trouble swallowing, trouble breathing, other concerning symptoms.

## 2021-01-07 NOTE — ED Provider Notes (Signed)
[facility]   [id number] 01/07/21 Arrival Time: 1501   CC: COVID symptoms  SUBJECTIVE: History from: patient.  Luis Marquez is a 60 y.o. male who presents with 2 weeks of SOB, cough, chest and nasal congestion. Denies sick exposure to COVID, flu or strep. Denies recent travel. Has positive history of Covid. Has taken OTC cough and cold medications for this with little relief. Cough and SOB are worse with activity and deep breathing. Denies previous symptoms in the past. Denies fever, chills, fatigue, sinus pain, sore throat, SOB, wheezing, chest pain, nausea, changes in bowel or bladder habits.    ROS: As per HPI.  All other pertinent ROS negative.     Past Medical History:  Diagnosis Date   Hypertension    Past Surgical History:  Procedure Laterality Date   HERNIA REPAIR     INTRAMEDULLARY (IM) NAIL INTERTROCHANTERIC Right 11/11/2014   Procedure: INTRAMEDULLARY (IM) NAIL INTERTROCHANTRIC;  Surgeon: Durene Romans, MD;  Location: [facility];  Service: Orthopedics;  Laterality: Right;   Rod right leg     No Known Allergies No current facility-administered medications on file prior to encounter.   Current Outpatient Medications on File Prior to Encounter  Medication Sig Dispense Refill   Pseudoeph-Doxylamine-DM-APAP (DAYQUIL/NYQUIL COLD/FLU RELIEF PO) Take by mouth.     albuterol (VENTOLIN HFA) 108 (90 Base) MCG/ACT inhaler INHALE 2 PUFFS INTO THE LUNGS EVERY 6 (SIX) HOURS AS NEEDED FOR WHEEZING OR SHORTNESS OF BREATH (COUGH). 18 g 0   amLODipine (NORVASC) 10 MG tablet TAKE 1 TABLET (10 MG TOTAL) BY MOUTH DAILY. 90 tablet 3   ferrous sulfate 325 (65 FE) MG tablet Take 1 tablet (325 mg total) by mouth 2 (two) times daily with a meal. 180 tablet 3   fluticasone furoate-vilanterol (BREO ELLIPTA) 200-25 MCG/INH AEPB INHALE 1 PUFF INTO THE LUNGS DAILY. 90 each 3   furosemide (LASIX) 20 MG tablet Take 1 tablet (20 mg total) by mouth daily as needed. 30 tablet 3    GLUCOS-CHONDROIT-MSM-C-HYAL PO Take 1 tablet by mouth daily.     lisinopril (ZESTRIL) 20 MG tablet TAKE 1 TABLET (20 MG TOTAL) BY MOUTH DAILY. 90 tablet 1   Multiple Vitamin (MULTIVITAMIN WITH MINERALS) TABS tablet Take 1 tablet by mouth daily.     Social History   Socioeconomic History   Marital status: Single    Spouse name: Not on file   Number of children: Not on file   Years of education: Not on file   Highest education level: Not on file  Occupational History   Not on file  Tobacco Use   Smoking status: Every Day    Packs/day: 0.25    Pack years: 0.00    Types: Cigarettes   Smokeless tobacco: Never  Vaping Use   Vaping Use: Never used  Substance and Sexual Activity   Alcohol use: Not Currently   Drug use: Not Currently   Sexual activity: Yes  Other Topics Concern   Not on file  Social History Narrative   ** Merged History Encounter **       Social Determinants of Health   Financial Resource Strain: Not on file  Food Insecurity: Not on file  Transportation Needs: Not on file  Physical Activity: Not on file  Stress: Not on file  Social Connections: Not on file  Intimate Partner Violence: Not on file   Family History  Problem Relation Age of Onset   Diabetes Mother     OBJECTIVE:  Vitals:  01/07/21 1537  BP: (!) 145/80  Pulse: 87  Resp: 20  Temp: 98.8 F (37.1 C)  TempSrc: Oral  SpO2: 95%     General appearance: alert; appears fatigued, but nontoxic; speaking in full sentences and tolerating own secretions HEENT: NCAT; Ears: EACs clear, TMs pearly gray; Eyes: PERRL.  EOM grossly intact. Sinuses: nontender; Nose: nares patent with clear rhinorrhea, Throat: oropharynx erythematous, cobblestoning present, tonsils non erythematous or enlarged, uvula midline  Neck: supple without LAD Lungs: unlabored respirations, symmetrical air entry; cough: moderate; no respiratory distress; diffuse wheezing bilaterally, scattered rhonchi throughout Heart: regular  rate and rhythm.  Radial pulses 2+ symmetrical bilaterally Skin: warm and dry Psychological: alert and cooperative; normal mood and affect  LABS:  No results found for this or any previous visit (from the past 24 hour(s)).   ASSESSMENT & PLAN:  1. COPD exacerbation (HCC)     Meds ordered this encounter  Medications   doxycycline (VIBRAMYCIN) 100 MG capsule    Sig: Take 1 capsule (100 mg total) by mouth 2 (two) times daily.    Dispense:  14 capsule    Refill:  0    Order Specific Question:   Supervising Provider    Answer:   Merrilee Jansky [[id number]]   predniSONE (STERAPRED UNI-PAK 21 TAB) 10 MG (21) TBPK tablet    Sig: Take by mouth daily for 6 days. Take 6 tablets on day 1, 5 tablets on day 2, 4 tablets on day 3, 3 tablets on day 4, 2 tablets on day 5, 1 tablet on day 6    Dispense:  21 tablet    Refill:  0    Order Specific Question:   Supervising Provider    Answer:   Merrilee Jansky [[id number]]    Prescribed doxycycline 100mg  BID x 7 days Prednisone taper prescribed Xray negative for pneumonia today Declines Covid testing today Continue supportive care at home Get plenty of rest and push fluids Use OTC zyrtec for nasal congestion, runny nose, and/or sore throat Use OTC flonase for nasal congestion and runny nose Use medications daily for symptom relief Use OTC medications like ibuprofen or tylenol as needed fever or pain Call or go to the ED if you have any new or worsening symptoms such as fever, worsening cough, shortness of breath, chest tightness, chest pain, turning blue, changes in mental status.  Reviewed expectations re: course of current medical issues. Questions answered. Outlined signs and symptoms indicating need for more acute intervention. Patient verbalized understanding. After Visit Summary given.          , NP 01/07/21 1701

## 2021-01-08 ENCOUNTER — Other Ambulatory Visit: Payer: Self-pay

## 2021-02-01 ENCOUNTER — Other Ambulatory Visit: Payer: Self-pay | Admitting: Nurse Practitioner

## 2021-02-01 ENCOUNTER — Other Ambulatory Visit: Payer: Self-pay

## 2021-02-01 DIAGNOSIS — F17200 Nicotine dependence, unspecified, uncomplicated: Secondary | ICD-10-CM

## 2021-02-01 MED FILL — Lisinopril Tab 20 MG: ORAL | 30 days supply | Qty: 30 | Fill #3 | Status: AC

## 2021-02-01 MED FILL — Amlodipine Besylate Tab 10 MG (Base Equivalent): ORAL | 30 days supply | Qty: 30 | Fill #3 | Status: AC

## 2021-02-01 MED FILL — Fluticasone Furoate-Vilanterol Aero Powd BA 200-25 MCG/ACT: RESPIRATORY_TRACT | 90 days supply | Qty: 3 | Fill #1 | Status: AC

## 2021-02-01 NOTE — Telephone Encounter (Signed)
Requested medication (s) are due for refill today: no  Requested medication (s) are on the active medication list: yes   Last refill: 6/16/202 for 18g   Future visit scheduled: yes   Notes to clinic:  One inhaler should last at least one month. If the patient is requesting refills earlier, contact the patient to check for uncontrolled symptoms   Requested Prescriptions  Pending Prescriptions Disp Refills   albuterol (VENTOLIN HFA) 108 (90 Base) MCG/ACT inhaler 18 g 0    Sig: INHALE 2 PUFFS INTO THE LUNGS EVERY 6 (SIX) HOURS AS NEEDED FOR WHEEZING OR SHORTNESS OF BREATH (COUGH).      Pulmonology:  Beta Agonists Failed - 02/01/2021  2:58 PM      Failed - One inhaler should last at least one month. If the patient is requesting refills earlier, contact the patient to check for uncontrolled symptoms.      Passed - Valid encounter within last 12 months    Recent Outpatient Visits           4 months ago Essential hypertension   [facility] [place], Shea Stakes, NP   6 months ago Essential hypertension   [facility] Claiborne Rigg, NP   9 months ago Essential hypertension   [facility] [place], Shea Stakes, NP   11 months ago Chronic right shoulder pain   [facility] [place], Shea Stakes, NP   1 year ago Essential hypertension   [facility] [place], Shea Stakes, NP       Future Appointments             In 4 days Claiborne Rigg, NP [employer] [facility]

## 2021-02-02 ENCOUNTER — Other Ambulatory Visit: Payer: Self-pay

## 2021-02-02 MED ORDER — ALBUTEROL SULFATE HFA 108 (90 BASE) MCG/ACT IN AERS
INHALATION_SPRAY | RESPIRATORY_TRACT | 0 refills | Status: DC
  Filled 2021-02-02: qty 18, 25d supply, fill #0

## 2021-02-03 ENCOUNTER — Other Ambulatory Visit: Payer: Self-pay

## 2021-02-05 ENCOUNTER — Ambulatory Visit: Payer: Self-pay | Admitting: Nurse Practitioner

## 2021-03-02 ENCOUNTER — Ambulatory Visit: Payer: Self-pay | Attending: Nurse Practitioner | Admitting: Nurse Practitioner

## 2021-03-02 ENCOUNTER — Other Ambulatory Visit: Payer: Self-pay

## 2021-03-02 ENCOUNTER — Encounter: Payer: Self-pay | Admitting: Nurse Practitioner

## 2021-03-02 VITALS — BP 126/75 | HR 85 | Temp 98.4°F | Ht 73.0 in | Wt 208.4 lb

## 2021-03-02 DIAGNOSIS — J449 Chronic obstructive pulmonary disease, unspecified: Secondary | ICD-10-CM

## 2021-03-02 DIAGNOSIS — G629 Polyneuropathy, unspecified: Secondary | ICD-10-CM

## 2021-03-02 DIAGNOSIS — D649 Anemia, unspecified: Secondary | ICD-10-CM

## 2021-03-02 DIAGNOSIS — I10 Essential (primary) hypertension: Secondary | ICD-10-CM

## 2021-03-02 MED ORDER — GABAPENTIN 300 MG PO CAPS
300.0000 mg | ORAL_CAPSULE | Freq: Every day | ORAL | 0 refills | Status: DC
  Filled 2021-03-02: qty 30, 30d supply, fill #0
  Filled 2021-04-06: qty 30, 30d supply, fill #1
  Filled 2021-05-10: qty 30, 30d supply, fill #2

## 2021-03-02 MED ORDER — FLUTICASONE FUROATE-VILANTEROL 200-25 MCG/INH IN AEPB
1.0000 | INHALATION_SPRAY | Freq: Every day | RESPIRATORY_TRACT | 3 refills | Status: AC
  Filled 2021-03-02 (×2): qty 180, 90d supply, fill #0
  Filled 2021-03-02: qty 90, 90d supply, fill #0

## 2021-03-02 NOTE — Progress Notes (Signed)
Assessment & Plan:  Luis Marquez was seen today for hypertension.  Diagnoses and all orders for this visit:  Essential hypertension Continue all antihypertensives as prescribed.  Remember to bring in your blood pressure log with you for your follow up appointment.  DASH/Mediterranean Diets are healthier choices for HTN.    Chronic obstructive pulmonary disease, unspecified COPD type (HCC) -     fluticasone furoate-vilanterol (BREO ELLIPTA) 200-25 MCG/INH AEPB; INHALE 1 PUFF INTO THE LUNGS DAILY.  Anemia, unspecified type -     CBC -     CMP14+EGFR  Neuropathy -     gabapentin (NEURONTIN) 300 MG capsule; Take 1 capsule (300 mg total) by mouth at bedtime. -     Vitamin B12 -     VITAMIN D 25 Hydroxy (Vit-D Deficiency, Fractures)   Patient has been counseled on age-appropriate routine health concerns for screening and prevention. These are reviewed and up-to-date. Referrals have been placed accordingly. Immunizations are up-to-date or declined.    Subjective:   Chief Complaint  Patient presents with   Hypertension   [name] 60 y.o. male presents to office today for follow up to HTN. He has a past medical history of Hypertension.   HTN Blood pressure is well controlled. Taking amlodipine 10 mg daily and lisinopril 20 mg daily. Denies chest pain, shortness of breath, palpitations, lightheadedness, dizziness, headaches or BLE edema.  States he is no longer taking furosemide prn as his BLE edema has resolved.  BP Readings from Last 3 Encounters:  03/02/21 126/75  01/07/21 (!) 145/80  09/14/20 (!) 148/77     Neuropathy  He describes symptoms of numbness and tingling in the bilateral hands and arms. Onset of symptoms was  several weeks ago . Symptoms are currently of moderate severity. Symptoms occur intermittently and last hours.  Symptoms are symmetric. He denies  bloating, diarrhea, blurred vision, and back or neck pain. Previous treatment has included none  COPD Denies  cough, wheezing or worsening shortness of breath. Taking BREO as instructed.   Review of Systems  Constitutional:  Negative for fever, malaise/fatigue and weight loss.  HENT: Negative.  Negative for nosebleeds.   Eyes: Negative.  Negative for blurred vision, double vision and photophobia.  Respiratory: Negative.  Negative for cough and shortness of breath.   Cardiovascular: Negative.  Negative for chest pain, palpitations and leg swelling.  Gastrointestinal: Negative.  Negative for heartburn, nausea and vomiting.  Musculoskeletal: Negative.  Negative for myalgias.  Neurological:  Positive for tingling and sensory change. Negative for dizziness, focal weakness, seizures and headaches.  Psychiatric/Behavioral: Negative.  Negative for suicidal ideas.    Past Medical History:  Diagnosis Date   Hypertension     Past Surgical History:  Procedure Laterality Date   HERNIA REPAIR     INTRAMEDULLARY (IM) NAIL INTERTROCHANTERIC Right 11/11/2014   Procedure: INTRAMEDULLARY (IM) NAIL INTERTROCHANTRIC;  Surgeon: Paralee Cancel, MD;  Location: [facility];  Service: Orthopedics;  Laterality: Right;   Rod right leg      Family History  Problem Relation Age of Onset   Diabetes Mother     Social History Reviewed with no changes to be made today.   Outpatient Medications Prior to Visit  Medication Sig Dispense Refill   albuterol (VENTOLIN HFA) 108 (90 Base) MCG/ACT inhaler INHALE 2 PUFFS INTO THE LUNGS EVERY 6 (SIX) HOURS AS NEEDED FOR WHEEZING OR SHORTNESS OF BREATH (COUGH). 18 g 0   amLODipine (NORVASC) 10 MG tablet TAKE 1 TABLET (10 MG  TOTAL) BY MOUTH DAILY. 90 tablet 3   GLUCOS-CHONDROIT-MSM-C-HYAL PO Take 1 tablet by mouth daily.     lisinopril (ZESTRIL) 20 MG tablet TAKE 1 TABLET (20 MG TOTAL) BY MOUTH DAILY. 90 tablet 1   Multiple Vitamin (MULTIVITAMIN WITH MINERALS) TABS tablet Take 1 tablet by mouth daily.     fluticasone furoate-vilanterol (BREO ELLIPTA) 200-25 MCG/INH AEPB INHALE 1 PUFF INTO  THE LUNGS DAILY. 90 each 3   ferrous sulfate 325 (65 FE) MG tablet Take 1 tablet (325 mg total) by mouth 2 (two) times daily with a meal. 180 tablet 3   furosemide (LASIX) 20 MG tablet Take 1 tablet (20 mg total) by mouth daily as needed. 30 tablet 3   doxycycline (VIBRAMYCIN) 100 MG capsule Take 1 capsule (100 mg total) by mouth 2 (two) times daily. 14 capsule 0   Pseudoeph-Doxylamine-DM-APAP (DAYQUIL/NYQUIL COLD/FLU RELIEF PO) Take by mouth.     No facility-administered medications prior to visit.    No Known Allergies     Objective:    BP 126/75 (BP Location: Right Arm, Patient Position: Sitting, Cuff Size: Large)   Pulse 85   Temp 98.4 F (36.9 C) (Oral)   Ht 6' 1"  (1.854 m)   Wt 208 lb 6.4 oz (94.5 kg)   SpO2 94%   BMI 27.50 kg/m  Wt Readings from Last 3 Encounters:  03/02/21 208 lb 6.4 oz (94.5 kg)  09/14/20 211 lb 9.6 oz (96 kg)  04/08/20 212 lb (96.2 kg)    Physical Exam Vitals and nursing note reviewed.  Constitutional:      Appearance: He is well-developed.  HENT:     Head: Normocephalic and atraumatic.  Cardiovascular:     Rate and Rhythm: Normal rate and regular rhythm.     Heart sounds: Normal heart sounds. No murmur heard.   No friction rub. No gallop.  Pulmonary:     Effort: Pulmonary effort is normal. No tachypnea or respiratory distress.     Breath sounds: Normal breath sounds. No decreased breath sounds, wheezing, rhonchi or rales.  Chest:     Chest wall: No tenderness.  Abdominal:     General: Bowel sounds are normal.     Palpations: Abdomen is soft.  Musculoskeletal:        General: Normal range of motion.     Cervical back: Normal range of motion.  Skin:    General: Skin is warm and dry.  Neurological:     Mental Status: He is alert and oriented to person, place, and time.     Coordination: Coordination normal.  Psychiatric:        Behavior: Behavior normal. Behavior is cooperative.        Thought Content: Thought content normal.         Judgment: Judgment normal.         Patient has been counseled extensively about nutrition and exercise as well as the importance of adherence with medications and regular follow-up. The patient was given clear instructions to go to ER or return to medical center if symptoms don't improve, worsen or new problems develop. The patient verbalized understanding.   Follow-up: Return in about 3 weeks (around 03/23/2021) for Neuropathy tele 810 or  130 in 3 weeks.   Gildardo Pounds, FNP-BC [facility] [place], [place]   03/02/2021, 7:49 PM

## 2021-03-03 ENCOUNTER — Other Ambulatory Visit: Payer: Self-pay

## 2021-03-03 LAB — CMP14+EGFR
ALT: 17 IU/L (ref 0–44)
AST: 29 IU/L (ref 0–40)
Albumin/Globulin Ratio: 1.6 (ref 1.2–2.2)
Albumin: 4 g/dL (ref 3.8–4.8)
Alkaline Phosphatase: 90 IU/L (ref 44–121)
BUN/Creatinine Ratio: 10 (ref 10–24)
BUN: 7 mg/dL — ABNORMAL LOW (ref 8–27)
Bilirubin Total: 0.7 mg/dL (ref 0.0–1.2)
CO2: 30 mmol/L — ABNORMAL HIGH (ref 20–29)
Calcium: 9.3 mg/dL (ref 8.6–10.2)
Chloride: 96 mmol/L (ref 96–106)
Creatinine, Ser: 0.69 mg/dL — ABNORMAL LOW (ref 0.76–1.27)
Globulin, Total: 2.5 g/dL (ref 1.5–4.5)
Glucose: 91 mg/dL (ref 65–99)
Potassium: 4.2 mmol/L (ref 3.5–5.2)
Sodium: 141 mmol/L (ref 134–144)
Total Protein: 6.5 g/dL (ref 6.0–8.5)
eGFR: 105 mL/min/{1.73_m2} (ref >59)

## 2021-03-03 LAB — CBC
Hematocrit: 37.5 % (ref 37.5–51.0)
Hemoglobin: 13.1 g/dL (ref 13.0–17.7)
MCH: 34.1 pg — ABNORMAL HIGH (ref 26.6–33.0)
MCHC: 34.9 g/dL (ref 31.5–35.7)
MCV: 98 fL — ABNORMAL HIGH (ref 79–97)
Platelets: 241 10*3/uL (ref 150–450)
RBC: 3.84 x10E6/uL — ABNORMAL LOW (ref 4.14–5.80)
RDW: 13.1 % (ref 11.6–15.4)
WBC: 6.6 10*3/uL (ref 3.4–10.8)

## 2021-03-03 LAB — VITAMIN B12: Vitamin B-12: 201 pg/mL — ABNORMAL LOW (ref 232–1245)

## 2021-03-03 LAB — VITAMIN D 25 HYDROXY (VIT D DEFICIENCY, FRACTURES): Vit D, 25-Hydroxy: 62.1 ng/mL (ref 30.0–100.0)

## 2021-03-11 ENCOUNTER — Other Ambulatory Visit: Payer: Self-pay

## 2021-03-11 MED FILL — Amlodipine Besylate Tab 10 MG (Base Equivalent): ORAL | 30 days supply | Qty: 30 | Fill #4 | Status: AC

## 2021-03-11 MED FILL — Lisinopril Tab 20 MG: ORAL | 30 days supply | Qty: 30 | Fill #4 | Status: AC

## 2021-03-16 ENCOUNTER — Other Ambulatory Visit: Payer: Self-pay

## 2021-03-24 ENCOUNTER — Telehealth: Payer: Self-pay

## 2021-03-24 ENCOUNTER — Encounter: Payer: Self-pay | Admitting: Nurse Practitioner

## 2021-03-24 ENCOUNTER — Other Ambulatory Visit: Payer: Self-pay

## 2021-03-24 ENCOUNTER — Ambulatory Visit: Payer: Self-pay | Attending: Nurse Practitioner | Admitting: Nurse Practitioner

## 2021-03-24 DIAGNOSIS — G629 Polyneuropathy, unspecified: Secondary | ICD-10-CM

## 2021-03-24 NOTE — Progress Notes (Signed)
Virtual Visit via Telephone Note Due to national recommendations of social distancing due to COVID 19, telehealth visit is felt to be most appropriate for this patient at this time.  I discussed the limitations, risks, security and privacy concerns of performing an evaluation and management service by telephone and the availability of in person appointments. I also discussed with the patient that there may be a patient responsible charge related to this service. The patient expressed understanding and agreed to proceed.    I connected with Vikki Ports on 03/24/21  at   1:30 PM EDT  EDT by telephone and verified that I am speaking with the correct person using two identifiers.  Location of Patient: Private Residence   Location of Provider: [facility] [employer] [facility]    Persons participating in Telemedicine visit: Bertram Denver FNP-BC Estiven Kohan    History of Present Illness: Telemedicine visit for: Neuropathy He has a past medical history of Hypertension.   He was evaluated on March 02, 2021 for bilateral upper extremity neuropathy.  At that time he was prescribed gabapentin 300 mg nightly. He described symptoms of numbness and tingling in the bilateral hands and arms. Onset of symptoms was  several weeks ago Symptoms of moderate severity are currently controlled at this time with once nightly use of gabapentin. Symptoms occur intermittently and last hours.  Symptoms are symmetric. He denies  bloating, diarrhea, blurred vision, and back or neck pain. Previous treatment aside from recent  fill of gabapentin has included none  He has no additional concerns today and will continue gabapentin at this time. Denies chest pain, shortness of breath, palpitations, lightheadedness, dizziness, headaches or BLE edema.        Past Medical History:  Diagnosis Date   Hypertension     Past Surgical History:  Procedure Laterality Date   HERNIA REPAIR     INTRAMEDULLARY (IM)  NAIL INTERTROCHANTERIC Right 11/11/2014   Procedure: INTRAMEDULLARY (IM) NAIL INTERTROCHANTRIC;  Surgeon: Durene Romans, MD;  Location: [facility];  Service: Orthopedics;  Laterality: Right;   Rod right leg      Family History  Problem Relation Age of Onset   Diabetes Mother     Social History   Socioeconomic History   Marital status: Single    Spouse name: Not on file   Number of children: Not on file   Years of education: Not on file   Highest education level: Not on file  Occupational History   Not on file  Tobacco Use   Smoking status: Every Day    Packs/day: 0.25    Types: Cigarettes   Smokeless tobacco: Never  Vaping Use   Vaping Use: Never used  Substance and Sexual Activity   Alcohol use: Not Currently   Drug use: Not Currently   Sexual activity: Yes  Other Topics Concern   Not on file  Social History Narrative   ** Merged History Encounter **       Social Determinants of Health   Financial Resource Strain: Not on file  Food Insecurity: Not on file  Transportation Needs: Not on file  Physical Activity: Not on file  Stress: Not on file  Social Connections: Not on file     Observations/Objective: Awake, alert and oriented x 3   Review of Systems  Constitutional:  Negative for fever, malaise/fatigue and weight loss.  HENT: Negative.  Negative for nosebleeds.   Eyes: Negative.  Negative for blurred vision, double vision and photophobia.  Respiratory: Negative.  Negative for cough and shortness of breath.   Cardiovascular: Negative.  Negative for chest pain, palpitations and leg swelling.  Gastrointestinal: Negative.  Negative for heartburn, nausea and vomiting.  Musculoskeletal: Negative.  Negative for myalgias.  Neurological:  Positive for sensory change. Negative for dizziness, focal weakness, seizures and headaches.  Psychiatric/Behavioral: Negative.  Negative for suicidal ideas.    Assessment and Plan: Diagnoses and all orders for this  visit:  Neuropathy Well controlled. Continue gabapentin    Follow Up Instructions Return in about 3 months (around 06/23/2021).     I discussed the assessment and treatment plan with the patient. The patient was provided an opportunity to ask questions and all were answered. The patient agreed with the plan and demonstrated an understanding of the instructions.   The patient was advised to call back or seek an in-person evaluation if the symptoms worsen or if the condition fails to improve as anticipated.  I provided 5 minutes of non-face-to-face time during this encounter including median intraservice time, reviewing previous notes, labs, imaging, medications and explaining diagnosis and management.  Claiborne Rigg, FNP-BC

## 2021-03-24 NOTE — Telephone Encounter (Signed)
Pt has been scheduled and reminder has been mailed.  

## 2021-03-24 NOTE — Telephone Encounter (Signed)
-----   Message from Claiborne Rigg, NP sent at 03/24/2021  1:43 PM EDT ----- Regarding: APPT Please schedule 3 months follow up to HTN

## 2021-04-06 ENCOUNTER — Other Ambulatory Visit: Payer: Self-pay

## 2021-04-16 ENCOUNTER — Other Ambulatory Visit: Payer: Self-pay

## 2021-04-16 MED FILL — Amlodipine Besylate Tab 10 MG (Base Equivalent): ORAL | 30 days supply | Qty: 30 | Fill #5 | Status: AC

## 2021-04-16 MED FILL — Lisinopril Tab 20 MG: ORAL | 30 days supply | Qty: 30 | Fill #5 | Status: AC

## 2021-05-10 ENCOUNTER — Other Ambulatory Visit: Payer: Self-pay | Admitting: Nurse Practitioner

## 2021-05-10 ENCOUNTER — Other Ambulatory Visit: Payer: Self-pay

## 2021-05-10 ENCOUNTER — Other Ambulatory Visit: Payer: Self-pay | Admitting: Family Medicine

## 2021-05-10 DIAGNOSIS — F17200 Nicotine dependence, unspecified, uncomplicated: Secondary | ICD-10-CM

## 2021-05-10 DIAGNOSIS — I10 Essential (primary) hypertension: Secondary | ICD-10-CM

## 2021-05-10 MED ORDER — LISINOPRIL 20 MG PO TABS
ORAL_TABLET | Freq: Every day | ORAL | 1 refills | Status: DC
  Filled 2021-05-10: qty 30, 30d supply, fill #0
  Filled 2021-06-21: qty 30, 30d supply, fill #1
  Filled 2021-07-26: qty 30, 30d supply, fill #0
  Filled 2021-09-01: qty 30, 30d supply, fill #1
  Filled 2021-10-15: qty 30, 30d supply, fill #2

## 2021-05-10 MED ORDER — ALBUTEROL SULFATE HFA 108 (90 BASE) MCG/ACT IN AERS
INHALATION_SPRAY | RESPIRATORY_TRACT | 2 refills | Status: DC
  Filled 2021-05-10: qty 18, 25d supply, fill #0
  Filled 2021-06-07: qty 18, 25d supply, fill #1
  Filled 2021-07-26: qty 18, 25d supply, fill #0

## 2021-05-10 MED FILL — Amlodipine Besylate Tab 10 MG (Base Equivalent): ORAL | 30 days supply | Qty: 30 | Fill #6 | Status: AC

## 2021-05-10 NOTE — Telephone Encounter (Signed)
Requested Prescriptions  Pending Prescriptions Disp Refills  . lisinopril (ZESTRIL) 20 MG tablet 90 tablet 1    Sig: TAKE 1 TABLET (20 MG TOTAL) BY MOUTH DAILY.     Cardiovascular:  ACE Inhibitors Failed - 05/10/2021  4:41 PM      Failed - Cr in normal range and within 180 days    Creatinine, Ser  Date Value Ref Range Status  03/02/2021 0.69 (L) 0.76 - 1.27 mg/dL Final         Passed - K in normal range and within 180 days    Potassium  Date Value Ref Range Status  03/02/2021 4.2 3.5 - 5.2 mmol/L Final         Passed - Patient is not pregnant      Passed - Last BP in normal range    BP Readings from Last 1 Encounters:  03/02/21 126/75         Passed - Valid encounter within last 6 months    Recent Outpatient Visits          1 month ago Neuropathy   [facility] [place], Shea Stakes, NP   2 months ago Essential hypertension   [place] [facility] [place], Shea Stakes, NP   7 months ago Essential hypertension   [facility] [place], Shea Stakes, NP   10 months ago Essential hypertension   [place] [facility] [place], Shea Stakes, NP   1 year ago Essential hypertension   [place] [facility] [place], Shea Stakes, NP      Future Appointments            In 1 month Claiborne Rigg, NP [facility]

## 2021-05-10 NOTE — Telephone Encounter (Signed)
Requested Prescriptions  Pending Prescriptions Disp Refills  . albuterol (VENTOLIN HFA) 108 (90 Base) MCG/ACT inhaler 18 g 2    Sig: INHALE 2 PUFFS INTO THE LUNGS EVERY 6 (SIX) HOURS AS NEEDED FOR WHEEZING OR SHORTNESS OF BREATH (COUGH).     Pulmonology:  Beta Agonists Failed - 05/10/2021  4:41 PM      Failed - One inhaler should last at least one month. If the patient is requesting refills earlier, contact the patient to check for uncontrolled symptoms.      Passed - Valid encounter within last 12 months    Recent Outpatient Visits          1 month ago Neuropathy   [place] [facility] [place], Shea Stakes, NP   2 months ago Essential hypertension   [place] [facility] [place], Shea Stakes, NP   7 months ago Essential hypertension   [facility] [place], Shea Stakes, NP   10 months ago Essential hypertension   [facility] [place], Shea Stakes, NP   1 year ago Essential hypertension   [place] [facility] Claiborne Rigg, NP      Future Appointments            In 1 month Claiborne Rigg, NP [employer] [facility]

## 2021-05-11 ENCOUNTER — Other Ambulatory Visit: Payer: Self-pay

## 2021-06-07 ENCOUNTER — Other Ambulatory Visit: Payer: Self-pay | Admitting: Pharmacist

## 2021-06-07 ENCOUNTER — Other Ambulatory Visit: Payer: Self-pay

## 2021-06-07 ENCOUNTER — Other Ambulatory Visit: Payer: Self-pay | Admitting: Nurse Practitioner

## 2021-06-07 DIAGNOSIS — R600 Localized edema: Secondary | ICD-10-CM

## 2021-06-07 MED ORDER — FLUTICASONE FUROATE-VILANTEROL 100-25 MCG/ACT IN AEPB
1.0000 | INHALATION_SPRAY | Freq: Every day | RESPIRATORY_TRACT | 2 refills | Status: DC
  Filled 2021-06-07 – 2021-07-26 (×2): qty 60, 30d supply, fill #0
  Filled 2021-09-01: qty 60, 30d supply, fill #1

## 2021-06-07 MED ORDER — FUROSEMIDE 20 MG PO TABS
20.0000 mg | ORAL_TABLET | Freq: Every day | ORAL | 0 refills | Status: DC | PRN
  Filled 2021-06-07: qty 30, 30d supply, fill #0

## 2021-06-07 NOTE — Telephone Encounter (Signed)
Requested medication (s) are due for refill today:   Yes but rx is from 2021  Requested medication (s) are on the active medication list:   Yes  Future visit scheduled:   Yes   Last ordered: 02/26/2020-03/27/2020 #30, 3 refills  In office note 03/02/2021 He is no longer taking this for BLE edema because it has resolved.  Returning for provider review for refill since he has been off of it.   May be a new Rx for it.   Requested Prescriptions  Pending Prescriptions Disp Refills   furosemide (LASIX) 20 MG tablet 30 tablet 3    Sig: Take 1 tablet (20 mg total) by mouth daily as needed.     Cardiovascular:  Diuretics - Loop Failed - 06/07/2021  8:25 AM      Failed - Cr in normal range and within 360 days    Creatinine, Ser  Date Value Ref Range Status  03/02/2021 0.69 (L) 0.76 - 1.27 mg/dL Final          Passed - K in normal range and within 360 days    Potassium  Date Value Ref Range Status  03/02/2021 4.2 3.5 - 5.2 mmol/L Final          Passed - Ca in normal range and within 360 days    Calcium  Date Value Ref Range Status  03/02/2021 9.3 8.6 - 10.2 mg/dL Final          Passed - Na in normal range and within 360 days    Sodium  Date Value Ref Range Status  03/02/2021 141 134 - 144 mmol/L Final          Passed - Last BP in normal range    BP Readings from Last 1 Encounters:  03/02/21 126/75          Passed - Valid encounter within last 6 months    Recent Outpatient Visits           2 months ago Neuropathy   [facility] [place], Shea Stakes, NP   3 months ago Essential hypertension   [place] [facility] [place], Shea Stakes, NP   8 months ago Essential hypertension   [facility] [place], Shea Stakes, NP   11 months ago Essential hypertension   [place] [facility] [place], Shea Stakes, NP   1 year ago Essential hypertension   [place] [facility]  [place], Shea Stakes, NP       Future Appointments             In 2 weeks Claiborne Rigg, NP [employer] [facility]

## 2021-06-17 ENCOUNTER — Other Ambulatory Visit: Payer: Self-pay | Admitting: Nurse Practitioner

## 2021-06-17 ENCOUNTER — Other Ambulatory Visit: Payer: Self-pay

## 2021-06-17 DIAGNOSIS — G629 Polyneuropathy, unspecified: Secondary | ICD-10-CM

## 2021-06-18 ENCOUNTER — Other Ambulatory Visit: Payer: Self-pay

## 2021-06-18 MED ORDER — GABAPENTIN 300 MG PO CAPS
300.0000 mg | ORAL_CAPSULE | Freq: Every day | ORAL | 0 refills | Status: DC
  Filled 2021-06-18 – 2021-07-26 (×2): qty 30, 30d supply, fill #0
  Filled 2021-09-01: qty 30, 30d supply, fill #1

## 2021-06-21 ENCOUNTER — Other Ambulatory Visit: Payer: Self-pay

## 2021-06-21 MED FILL — Amlodipine Besylate Tab 10 MG (Base Equivalent): ORAL | 30 days supply | Qty: 30 | Fill #7 | Status: AC

## 2021-06-25 ENCOUNTER — Ambulatory Visit: Payer: Self-pay | Admitting: Nurse Practitioner

## 2021-07-26 ENCOUNTER — Other Ambulatory Visit: Payer: Self-pay

## 2021-07-26 MED FILL — Amlodipine Besylate Tab 10 MG (Base Equivalent): ORAL | 30 days supply | Qty: 30 | Fill #0 | Status: AC

## 2021-07-27 ENCOUNTER — Other Ambulatory Visit: Payer: Self-pay

## 2021-09-01 ENCOUNTER — Other Ambulatory Visit: Payer: Self-pay

## 2021-09-01 MED FILL — Amlodipine Besylate Tab 10 MG (Base Equivalent): ORAL | 30 days supply | Qty: 30 | Fill #1 | Status: AC

## 2021-09-02 ENCOUNTER — Other Ambulatory Visit: Payer: Self-pay

## 2021-10-15 ENCOUNTER — Other Ambulatory Visit: Payer: Self-pay | Admitting: Family Medicine

## 2021-10-15 ENCOUNTER — Other Ambulatory Visit: Payer: Self-pay | Admitting: Nurse Practitioner

## 2021-10-15 ENCOUNTER — Other Ambulatory Visit: Payer: Self-pay

## 2021-10-15 DIAGNOSIS — G629 Polyneuropathy, unspecified: Secondary | ICD-10-CM

## 2021-10-15 DIAGNOSIS — I10 Essential (primary) hypertension: Secondary | ICD-10-CM

## 2021-10-15 DIAGNOSIS — F17200 Nicotine dependence, unspecified, uncomplicated: Secondary | ICD-10-CM

## 2021-11-02 ENCOUNTER — Inpatient Hospital Stay (HOSPITAL_COMMUNITY): Payer: Medicaid Other

## 2021-11-02 ENCOUNTER — Inpatient Hospital Stay (HOSPITAL_COMMUNITY)
Admission: EM | Admit: 2021-11-02 | Discharge: 2021-11-16 | DRG: 640 | Disposition: A | Discharge status: Home / Self Care | Payer: Medicaid Other | Attending: Internal Medicine | Admitting: Internal Medicine

## 2021-11-02 ENCOUNTER — Emergency Department (HOSPITAL_COMMUNITY): Payer: Medicaid Other

## 2021-11-02 DIAGNOSIS — Z79899 Other long term (current) drug therapy: Secondary | ICD-10-CM

## 2021-11-02 DIAGNOSIS — Z597 Insufficient social insurance and welfare support: Secondary | ICD-10-CM

## 2021-11-02 DIAGNOSIS — I110 Hypertensive heart disease with heart failure: Secondary | ICD-10-CM | POA: Diagnosis not present

## 2021-11-02 DIAGNOSIS — Y990 Civilian activity done for income or pay: Secondary | ICD-10-CM | POA: Diagnosis not present

## 2021-11-02 DIAGNOSIS — F05 Delirium due to known physiological condition: Secondary | ICD-10-CM | POA: Diagnosis not present

## 2021-11-02 DIAGNOSIS — K760 Fatty (change of) liver, not elsewhere classified: Secondary | ICD-10-CM | POA: Diagnosis present

## 2021-11-02 DIAGNOSIS — Z23 Encounter for immunization: Secondary | ICD-10-CM | POA: Diagnosis not present

## 2021-11-02 DIAGNOSIS — Y9301 Activity, walking, marching and hiking: Secondary | ICD-10-CM | POA: Diagnosis present

## 2021-11-02 DIAGNOSIS — E870 Hyperosmolality and hypernatremia: Secondary | ICD-10-CM | POA: Diagnosis not present

## 2021-11-02 DIAGNOSIS — S0191XA Laceration without foreign body of unspecified part of head, initial encounter: Secondary | ICD-10-CM | POA: Diagnosis present

## 2021-11-02 DIAGNOSIS — I5021 Acute systolic (congestive) heart failure: Secondary | ICD-10-CM | POA: Diagnosis not present

## 2021-11-02 DIAGNOSIS — F10231 Alcohol dependence with withdrawal delirium: Secondary | ICD-10-CM | POA: Diagnosis not present

## 2021-11-02 DIAGNOSIS — N179 Acute kidney failure, unspecified: Secondary | ICD-10-CM | POA: Diagnosis not present

## 2021-11-02 DIAGNOSIS — J439 Emphysema, unspecified: Secondary | ICD-10-CM | POA: Diagnosis not present

## 2021-11-02 DIAGNOSIS — T148XXA Other injury of unspecified body region, initial encounter: Secondary | ICD-10-CM | POA: Diagnosis not present

## 2021-11-02 DIAGNOSIS — E871 Hypo-osmolality and hyponatremia: Secondary | ICD-10-CM

## 2021-11-02 DIAGNOSIS — E876 Hypokalemia: Secondary | ICD-10-CM | POA: Diagnosis present

## 2021-11-02 DIAGNOSIS — M25551 Pain in right hip: Secondary | ICD-10-CM | POA: Diagnosis present

## 2021-11-02 DIAGNOSIS — I480 Paroxysmal atrial fibrillation: Secondary | ICD-10-CM | POA: Diagnosis not present

## 2021-11-02 DIAGNOSIS — J9601 Acute respiratory failure with hypoxia: Secondary | ICD-10-CM | POA: Diagnosis not present

## 2021-11-02 DIAGNOSIS — E861 Hypovolemia: Secondary | ICD-10-CM | POA: Diagnosis present

## 2021-11-02 DIAGNOSIS — E878 Other disorders of electrolyte and fluid balance, not elsewhere classified: Secondary | ICD-10-CM | POA: Diagnosis present

## 2021-11-02 DIAGNOSIS — S61411A Laceration without foreign body of right hand, initial encounter: Principal | ICD-10-CM | POA: Diagnosis present

## 2021-11-02 DIAGNOSIS — Z043 Encounter for examination and observation following other accident: Secondary | ICD-10-CM | POA: Diagnosis not present

## 2021-11-02 DIAGNOSIS — I2510 Atherosclerotic heart disease of native coronary artery without angina pectoris: Secondary | ICD-10-CM | POA: Diagnosis not present

## 2021-11-02 DIAGNOSIS — I5083 High output heart failure: Secondary | ICD-10-CM | POA: Diagnosis not present

## 2021-11-02 DIAGNOSIS — I428 Other cardiomyopathies: Secondary | ICD-10-CM | POA: Diagnosis not present

## 2021-11-02 DIAGNOSIS — I4891 Unspecified atrial fibrillation: Secondary | ICD-10-CM | POA: Diagnosis not present

## 2021-11-02 DIAGNOSIS — F10931 Alcohol use, unspecified with withdrawal delirium: Secondary | ICD-10-CM | POA: Diagnosis not present

## 2021-11-02 DIAGNOSIS — Z7951 Long term (current) use of inhaled steroids: Secondary | ICD-10-CM

## 2021-11-02 DIAGNOSIS — I10 Essential (primary) hypertension: Secondary | ICD-10-CM | POA: Diagnosis present

## 2021-11-02 DIAGNOSIS — W010XXA Fall on same level from slipping, tripping and stumbling without subsequent striking against object, initial encounter: Secondary | ICD-10-CM | POA: Diagnosis present

## 2021-11-02 DIAGNOSIS — R911 Solitary pulmonary nodule: Secondary | ICD-10-CM | POA: Diagnosis present

## 2021-11-02 DIAGNOSIS — Z049 Encounter for examination and observation for unspecified reason: Secondary | ICD-10-CM | POA: Diagnosis not present

## 2021-11-02 DIAGNOSIS — R32 Unspecified urinary incontinence: Secondary | ICD-10-CM | POA: Diagnosis not present

## 2021-11-02 DIAGNOSIS — G9341 Metabolic encephalopathy: Secondary | ICD-10-CM | POA: Diagnosis not present

## 2021-11-02 DIAGNOSIS — T1490XA Injury, unspecified, initial encounter: Secondary | ICD-10-CM | POA: Diagnosis not present

## 2021-11-02 DIAGNOSIS — F10930 Alcohol use, unspecified with withdrawal, uncomplicated: Secondary | ICD-10-CM | POA: Diagnosis not present

## 2021-11-02 DIAGNOSIS — F17210 Nicotine dependence, cigarettes, uncomplicated: Secondary | ICD-10-CM | POA: Diagnosis not present

## 2021-11-02 DIAGNOSIS — S61419A Laceration without foreign body of unspecified hand, initial encounter: Secondary | ICD-10-CM | POA: Diagnosis present

## 2021-11-02 DIAGNOSIS — R748 Abnormal levels of other serum enzymes: Secondary | ICD-10-CM

## 2021-11-02 DIAGNOSIS — E873 Alkalosis: Secondary | ICD-10-CM | POA: Diagnosis present

## 2021-11-02 DIAGNOSIS — S0083XA Contusion of other part of head, initial encounter: Secondary | ICD-10-CM | POA: Diagnosis present

## 2021-11-02 DIAGNOSIS — W19XXXA Unspecified fall, initial encounter: Secondary | ICD-10-CM | POA: Diagnosis not present

## 2021-11-02 DIAGNOSIS — F10939 Alcohol use, unspecified with withdrawal, unspecified: Secondary | ICD-10-CM | POA: Diagnosis not present

## 2021-11-02 LAB — CBC
HCT: 37.3 % — ABNORMAL LOW (ref 39.0–52.0)
Hemoglobin: 13.4 g/dL (ref 13.0–17.0)
MCH: 33.9 pg (ref 26.0–34.0)
MCHC: 35.9 g/dL (ref 30.0–36.0)
MCV: 94.4 fL (ref 80.0–100.0)
Platelets: 155 10*3/uL (ref 150–400)
RBC: 3.95 MIL/uL — ABNORMAL LOW (ref 4.22–5.81)
RDW: 12.4 % (ref 11.5–15.5)
WBC: 9.4 10*3/uL (ref 4.0–10.5)
nRBC: 0 % (ref 0.0–0.2)

## 2021-11-02 LAB — URINALYSIS, ROUTINE W REFLEX MICROSCOPIC
Bilirubin Urine: NEGATIVE
Glucose, UA: NEGATIVE mg/dL
Hgb urine dipstick: NEGATIVE
Ketones, ur: NEGATIVE mg/dL
Nitrite: NEGATIVE
Protein, ur: NEGATIVE mg/dL
Specific Gravity, Urine: 1.015 (ref 1.005–1.030)
pH: 5 (ref 5.0–8.0)

## 2021-11-02 LAB — COMPREHENSIVE METABOLIC PANEL
ALT: 64 U/L — ABNORMAL HIGH (ref 0–44)
AST: 165 U/L — ABNORMAL HIGH (ref 15–41)
Albumin: 2.5 g/dL — ABNORMAL LOW (ref 3.5–5.0)
Alkaline Phosphatase: 103 U/L (ref 38–126)
BUN: 18 mg/dL (ref 8–23)
CO2: >45 mmol/L — ABNORMAL HIGH (ref 22–32)
Calcium: 14.5 mg/dL (ref 8.9–10.3)
Chloride: 73 mmol/L — ABNORMAL LOW (ref 98–111)
Creatinine, Ser: 1.69 mg/dL — ABNORMAL HIGH (ref 0.61–1.24)
GFR, Estimated: 45 mL/min — ABNORMAL LOW (ref >60)
Glucose, Bld: 104 mg/dL — ABNORMAL HIGH (ref 70–99)
Potassium: <2 mmol/L — CL (ref 3.5–5.1)
Sodium: 134 mmol/L — ABNORMAL LOW (ref 135–145)
Total Bilirubin: 1.7 mg/dL — ABNORMAL HIGH (ref 0.3–1.2)
Total Protein: 5.4 g/dL — ABNORMAL LOW (ref 6.5–8.1)

## 2021-11-02 LAB — BASIC METABOLIC PANEL
Anion gap: UNDETERMINED (ref 5–15)
Anion gap: 11 (ref 5–15)
BUN: 18 mg/dL (ref 8–23)
BUN: 17 mg/dL (ref 8–23)
CO2: >45 mmol/L — ABNORMAL HIGH (ref 22–32)
CO2: 45 mmol/L — ABNORMAL HIGH (ref 22–32)
Calcium: 14.3 mg/dL (ref 8.9–10.3)
Calcium: 13.6 mg/dL (ref 8.9–10.3)
Chloride: 74 mmol/L — ABNORMAL LOW (ref 98–111)
Chloride: 82 mmol/L — ABNORMAL LOW (ref 98–111)
Creatinine, Ser: 1.79 mg/dL — ABNORMAL HIGH (ref 0.61–1.24)
Creatinine, Ser: 1.61 mg/dL — ABNORMAL HIGH (ref 0.61–1.24)
GFR, Estimated: 42 mL/min — ABNORMAL LOW (ref >60)
GFR, Estimated: 48 mL/min — ABNORMAL LOW (ref >60)
Glucose, Bld: 104 mg/dL — ABNORMAL HIGH (ref 70–99)
Glucose, Bld: 103 mg/dL — ABNORMAL HIGH (ref 70–99)
Potassium: 2.1 mmol/L — CL (ref 3.5–5.1)
Potassium: 2.7 mmol/L — CL (ref 3.5–5.1)
Sodium: 136 mmol/L (ref 135–145)
Sodium: 138 mmol/L (ref 135–145)

## 2021-11-02 LAB — RAPID URINE DRUG SCREEN, HOSP PERFORMED
Amphetamines: NOT DETECTED
Barbiturates: NOT DETECTED
Benzodiazepines: NOT DETECTED
Cocaine: NOT DETECTED
Opiates: NOT DETECTED
Tetrahydrocannabinol: NOT DETECTED

## 2021-11-02 LAB — LIPASE, BLOOD: Lipase: 27 U/L (ref 11–51)

## 2021-11-02 LAB — MAGNESIUM
Magnesium: 1 mg/dL — ABNORMAL LOW (ref 1.7–2.4)
Magnesium: 1.7 mg/dL (ref 1.7–2.4)

## 2021-11-02 LAB — LACTIC ACID, PLASMA: Lactic Acid, Venous: 1 mmol/L (ref 0.5–1.9)

## 2021-11-02 LAB — PSA: Prostatic Specific Antigen: 1.63 ng/mL (ref 0.00–4.00)

## 2021-11-02 LAB — HIV ANTIBODY (ROUTINE TESTING W REFLEX): HIV Screen 4th Generation wRfx: NONREACTIVE

## 2021-11-02 LAB — CBG MONITORING, ED: Glucose-Capillary: 128 mg/dL — ABNORMAL HIGH (ref 70–99)

## 2021-11-02 LAB — PHOSPHORUS: Phosphorus: 2.3 mg/dL — ABNORMAL LOW (ref 2.5–4.6)

## 2021-11-02 LAB — ETHANOL: Alcohol, Ethyl (B): <10 mg/dL (ref <10)

## 2021-11-02 MED ORDER — MAGNESIUM SULFATE 2 GM/50ML IV SOLN
2.0000 g | Freq: Once | INTRAVENOUS | Status: AC
  Administered 2021-11-02: 2 g via INTRAVENOUS
  Filled 2021-11-02: qty 50

## 2021-11-02 MED ORDER — POTASSIUM CHLORIDE 10 MEQ/100ML IV SOLN
10.0000 meq | Freq: Once | INTRAVENOUS | Status: AC
  Administered 2021-11-02: 10 meq via INTRAVENOUS
  Filled 2021-11-02: qty 100

## 2021-11-02 MED ORDER — ADULT MULTIVITAMIN W/MINERALS CH
1.0000 | ORAL_TABLET | Freq: Every day | ORAL | Status: DC
  Administered 2021-11-03 – 2021-11-16 (×10): 1 via ORAL
  Filled 2021-11-02 (×10): qty 1

## 2021-11-02 MED ORDER — POTASSIUM CHLORIDE 10 MEQ/100ML IV SOLN
10.0000 meq | INTRAVENOUS | Status: AC
  Administered 2021-11-02 (×3): 10 meq via INTRAVENOUS
  Filled 2021-11-02 (×3): qty 100

## 2021-11-02 MED ORDER — CALCITONIN (SALMON) 200 UNIT/ML IJ SOLN
4.0000 [IU]/kg | Freq: Every day | INTRAMUSCULAR | Status: DC
Start: 2021-11-02 — End: 2021-11-02

## 2021-11-02 MED ORDER — FOLIC ACID 1 MG PO TABS
1.0000 mg | ORAL_TABLET | Freq: Every day | ORAL | Status: DC
  Administered 2021-11-03 – 2021-11-16 (×10): 1 mg via ORAL
  Filled 2021-11-02 (×10): qty 1

## 2021-11-02 MED ORDER — POTASSIUM CHLORIDE CRYS ER 20 MEQ PO TBCR
60.0000 meq | EXTENDED_RELEASE_TABLET | Freq: Once | ORAL | Status: AC
  Administered 2021-11-02: 60 meq via ORAL
  Filled 2021-11-02: qty 3

## 2021-11-02 MED ORDER — SODIUM CHLORIDE 0.9 % IV SOLN
INTRAVENOUS | Status: AC
Start: 2021-11-02 — End: 2021-11-03

## 2021-11-02 MED ORDER — POTASSIUM CHLORIDE 20 MEQ PO PACK
60.0000 meq | PACK | Freq: Once | ORAL | Status: DC
Start: 2021-11-03 — End: 2021-11-02

## 2021-11-02 MED ORDER — ENOXAPARIN SODIUM 40 MG/0.4ML IJ SOSY
40.0000 mg | PREFILLED_SYRINGE | INTRAMUSCULAR | Status: DC
  Administered 2021-11-02 – 2021-11-04 (×3): 40 mg via SUBCUTANEOUS
  Filled 2021-11-02 (×3): qty 0.4

## 2021-11-02 MED ORDER — CALCITONIN (SALMON) 200 UNIT/ML IJ SOLN
4.0000 [IU]/kg | Freq: Once | INTRAMUSCULAR | Status: DC
Start: 2021-11-02 — End: 2021-11-02

## 2021-11-02 MED ORDER — THIAMINE HCL 100 MG PO TABS
100.0000 mg | ORAL_TABLET | Freq: Every day | ORAL | Status: DC
  Administered 2021-11-03 – 2021-11-16 (×10): 100 mg via ORAL
  Filled 2021-11-02 (×10): qty 1

## 2021-11-02 MED ORDER — POTASSIUM CHLORIDE CRYS ER 20 MEQ PO TBCR
60.0000 meq | EXTENDED_RELEASE_TABLET | Freq: Once | ORAL | Status: AC
  Administered 2021-11-03: 60 meq via ORAL
  Filled 2021-11-02: qty 3

## 2021-11-02 MED ORDER — MAGNESIUM SULFATE 2 GM/50ML IV SOLN
2.0000 g | Freq: Once | INTRAVENOUS | Status: AC
  Administered 2021-11-03: 2 g via INTRAVENOUS
  Filled 2021-11-02: qty 50

## 2021-11-02 MED ORDER — CALCITONIN (SALMON) 200 UNIT/ML IJ SOLN
4.0000 [IU]/kg | Freq: Once | INTRAMUSCULAR | Status: AC
Start: 2021-11-02 — End: 2021-11-02
  Administered 2021-11-02: 376 [IU] via INTRAMUSCULAR
  Filled 2021-11-02: qty 1.88

## 2021-11-02 MED ORDER — TETANUS-DIPHTH-ACELL PERTUSSIS 5-2.5-18.5 LF-MCG/0.5 IM SUSY
0.5000 mL | PREFILLED_SYRINGE | Freq: Once | INTRAMUSCULAR | Status: AC
Start: 2021-11-02 — End: 2021-11-02
  Administered 2021-11-02: 0.5 mL via INTRAMUSCULAR
  Filled 2021-11-02: qty 0.5

## 2021-11-02 MED ORDER — ZOLEDRONIC ACID 4 MG/5ML IV CONC
4.0000 mg | Freq: Once | INTRAVENOUS | Status: AC
  Administered 2021-11-02: 4 mg via INTRAVENOUS
  Filled 2021-11-02: qty 5

## 2021-11-02 MED ORDER — BUPIVACAINE HCL 0.5 % IJ SOLN: 30.0000 mL | Freq: Once | INTRAMUSCULAR | Status: DC

## 2021-11-02 MED ORDER — THIAMINE HCL 100 MG/ML IJ SOLN
100.0000 mg | Freq: Every day | INTRAMUSCULAR | Status: DC
  Administered 2021-11-04 – 2021-11-07 (×3): 100 mg via INTRAVENOUS
  Filled 2021-11-02 (×5): qty 2

## 2021-11-02 MED ORDER — BACITRACIN ZINC 500 UNIT/GM EX OINT
TOPICAL_OINTMENT | Freq: Once | CUTANEOUS | Status: AC
  Administered 2021-11-02: 1 via TOPICAL
  Filled 2021-11-02: qty 2.7

## 2021-11-02 MED ORDER — BUPIVACAINE HCL (PF) 0.5 % IJ SOLN
30.0000 mL | Freq: Once | INTRAMUSCULAR | Status: AC
Start: 2021-11-02 — End: 2021-11-02
  Administered 2021-11-02: 30 mL
  Filled 2021-11-02: qty 30

## 2021-11-02 MED ORDER — SODIUM CHLORIDE 0.9 % IV BOLUS
1000.0000 mL | Freq: Once | INTRAVENOUS | Status: AC
  Administered 2021-11-02: 1000 mL via INTRAVENOUS

## 2021-11-02 NOTE — ED Triage Notes (Signed)
EMS stated, pt fell in parking lot at his work and hit his rt. Hand on the palm.  ?

## 2021-11-02 NOTE — H&P (Addendum)
?Date: 11/02/2021     ?     ?     ?Patient Name:  Luis Marquez MRN: [id number]  ?DOB: June 15, 1961 Age / Sex: 61 y.o., male   ?PCP: Claiborne Rigg, NP    ?     ?Medical Service: Internal Medicine Teaching Service    ?     ?Attending Physician: Dr. Oswaldo Done, Marquita Palms, *    ?First Contact: Rudene Christians, DO Pager: KM [phone]  ?Second Contact: Thalia Bloodgood, DO Pager: VK [phone]  ?     ?After Hours (After 5p/  First Contact Pager: [phone]  ?weekends / holidays): Second Contact Pager: [phone]  ? ?SUBJECTIVE  ? ?Chief Complaint: Fall ? ?History of Present Illness:  ? ?Mr. Luis Marquez is a 61 year old patient living with a history of hypertension, alcohol use disorder, prior right intertrochanteric fracture status post open reduction internal fixation with intramedullary/intratrochanteric nail placement who was brought to the hospital by EMS after a fall at work.  Patient states he was in his usual state of health and walking outside at work when he tripped and fell and hit his head on the ground.  He denies loss of consciousness.  He states he frequently has difficulty with his right leg and he has to think about lifting it.  It was a witnessed fall by his manager who then came over to assess the patient.  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  After falling he was also found to have urinated on himself.  He noticed he injured his right hand once in emergency department.  He endorses an additional fall 2 days prior at home he was able to catch himself on the wall and continue walking.  He denies any injuries from this. ? ?He currently endorses a frontal headache since the fall.  He has had ongoing right hip pain for the past few days as well has acutely worsened since his fall. ? ?He has had some change in his vision over the past few weeks, he notes that it has been more spotty.  He denies any lumps or swelling in his neck armpits or chest area.  He denies any  shortness of breath or chest pain.  Denies nausea, vomiting, diarrhea, numbness or tingling in his extremities. He does endorse night sweats for the past month.  He will wake up in the evening and his sheets will be soaked.  He has had these more frequently throughout the daytime.  Denies weight loss.  He also endorses urinating on himself more frequently over the past few weeks.  He states that he feels as though he has to urinate and he is unable to get to the bathroom in time.  He is uncertain as to why he urinated on himself after falling today at work.  He denies numbness and tingling in his rectum.  He denies back pain. ? ?ED Course: ?Once in the ED patient was found to be severely hypercalcemic, hypokalemic.  He was given electrolyte supplementation and admitted to our service ? ?Meds:  ?Amlodipine 10 mg daily ?Lisinopril 20 mg daily ?Breo Ellipta inhaler daily ?Lasix -patient denies taking this medication ? ?PMHx ?Hypertension ?Alcohol Use Disorder ? ?Past Surgical History:  ?Procedure Laterality Date  ? HERNIA REPAIR    ? INTRAMEDULLARY (IM) NAIL INTERTROCHANTERIC Right 11/11/2014  ? Procedure: INTRAMEDULLARY (IM) NAIL INTERTROCHANTRIC;  Surgeon: Durene Romans, MD;  Location: [facility];  Service: Orthopedics;  Laterality: Right;  ?  Rod right leg    ? ?Social:  ?Lives With: 6 other people who went through the same rehabilitation facility he did for alcohol use disorder  ?Occupation: Cytogeneticist.  ?Support: Friends in the area ?Level of Function: ADL/IADL ?PCP: Loreen Freud NP ?Substances: 1/2 PPD for 6 years, 4-5 mikes hard lemonade every other day, denies other substance use. ? ?Family History:  ?Patient's twin sisters with Hx of breast cancer.  ? ?Allergies: ?Allergies as of 11/02/2021  ? (No Known Allergies)  ? ?Review of Systems: ?A complete ROS was negative except as per HPI.  ? ?OBJECTIVE:  ? ?Physical Exam: ?Blood pressure (!) 158/90, pulse 94, temperature 98.4 ?F (36.9 ?C), temperature source Oral,  resp. rate 17, SpO2 96 %.  ?Constitutional: No acute distress ?HENT: normocephalic atraumatic, mucous membranes moist.  Pupils equal round reactive to light.  No cervical lymphadenopathy appreciated ?Eyes: conjunctiva non-erythematous ?Neck: supple ?Cardiovascular: regular rate and rhythm, no m/r/g ?Pulmonary/Chest: normal work of breathing on room air, wheezing in lower lung fields ?Abdominal: soft, non-tender, non-distended. Bowel sounds present. No hepato/splenomegaly ?MSK: normal bulk and tone.  No axillary or clavicular lymphadenopathy appreciated.  Ace bandage over right hand ?Neurological: alert & oriented x 3, 5/5 strength in bilateral upper and left lower extremity. Right lower extremity difficult to assess secondary to pain ?Skin: warm and dry ?Psych: Pleasant ? ? ? ? ?Labs: ?CBC ?   ?Component Value Date/Time  ? WBC 9.4 11/02/2021 1313  ? RBC 3.95 (L) 11/02/2021 1313  ? HGB 13.4 11/02/2021 1313  ? HGB 13.1 03/02/2021 1507  ? HCT 37.3 (L) 11/02/2021 1313  ? HCT 37.5 03/02/2021 1507  ? PLT 155 11/02/2021 1313  ? PLT 241 03/02/2021 1507  ? MCV 94.4 11/02/2021 1313  ? MCV 98 (H) 03/02/2021 1507  ? MCH 33.9 11/02/2021 1313  ? MCHC 35.9 11/02/2021 1313  ? RDW 12.4 11/02/2021 1313  ? RDW 13.1 03/02/2021 1507  ? LYMPHSABS 1.2 11/11/2014 1717  ? MONOABS 0.4 11/11/2014 1717  ? EOSABS 0.1 11/11/2014 1717  ? BASOSABS 0.0 11/11/2014 1717  ?  ? ?CMP  ?   ?Component Value Date/Time  ? NA 134 (L) 11/02/2021 1313  ? NA 136 11/02/2021 1313  ? NA 141 03/02/2021 1507  ? K <2.0 (LL) 11/02/2021 1313  ? K 2.1 (LL) 11/02/2021 1313  ? CL 73 (L) 11/02/2021 1313  ? CL 74 (L) 11/02/2021 1313  ? CO2 >45 (H) 11/02/2021 1313  ? CO2 >45 (H) 11/02/2021 1313  ? GLUCOSE 104 (H) 11/02/2021 1313  ? GLUCOSE 104 (H) 11/02/2021 1313  ? BUN 18 11/02/2021 1313  ? BUN 18 11/02/2021 1313  ? BUN 7 (L) 03/02/2021 1507  ? CREATININE 1.69 (H) 11/02/2021 1313  ? CREATININE 1.79 (H) 11/02/2021 1313  ? CALCIUM 14.5 (HH) 11/02/2021 1313  ? CALCIUM 14.3  (HH) 11/02/2021 1313  ? PROT 5.4 (L) 11/02/2021 1313  ? PROT 6.5 03/02/2021 1507  ? ALBUMIN 2.5 (L) 11/02/2021 1313  ? ALBUMIN 4.0 03/02/2021 1507  ? AST 165 (H) 11/02/2021 1313  ? ALT 64 (H) 11/02/2021 1313  ? ALKPHOS 103 11/02/2021 1313  ? BILITOT 1.7 (H) 11/02/2021 1313  ? BILITOT 0.7 03/02/2021 1507  ? GFRNONAA 45 (L) 11/02/2021 1313  ? GFRNONAA 42 (L) 11/02/2021 1313  ? GFRAA 114 07/15/2020 1502  ? ? ?Imaging: ?CT Head Wo Contrast ? ?Result Date: 11/02/2021 ?CLINICAL DATA:  A 61 year old male presents for evaluation of head trauma. EXAM: CT HEAD WITHOUT CONTRAST TECHNIQUE:  Contiguous axial images were obtained from the base of the skull through the vertex without intravenous contrast. RADIATION DOSE REDUCTION: This exam was performed according to the departmental dose-optimization program which includes automated exposure control, adjustment of the mA and/or kV according to patient size and/or use of iterative reconstruction technique. COMPARISON:  None FINDINGS: Brain: No evidence of acute infarction, hemorrhage, hydrocephalus, extra-axial collection or mass lesion/mass effect. Signs of atrophy and chronic microvascular ischemic change. Vascular: No hyperdense vessel or unexpected calcification. Skull: Normal. Negative for fracture or focal lesion. Sinuses/Orbits: Visualized paranasal sinuses and orbits are unremarkable. Other: None IMPRESSION: 1. No acute intracranial abnormality. 2. Signs of atrophy and chronic microvascular ischemic change. Electronically Signed   By: Donzetta Kohut M.D.   On: 11/02/2021 13:51  ? ?DG Hand Complete Right ? ?Result Date: 11/02/2021 ?CLINICAL DATA:  Trauma, fall EXAM: RIGHT HAND - COMPLETE 3+ VIEW COMPARISON:  None. FINDINGS: There is deformity in the head of the second metacarpal with possible lucency in the medial aspect of neck of second metacarpal. There is mild angulation in the medial cortex of neck of the second metacarpal. There is 2 mm smooth marginated calcification  adjacent to the base of proximal phalanx of index finger possibly old avulsion. Degenerative changes are noted in the multiple metacarpophalangeal joints, more so in the first, second and third metacarpophala

## 2021-11-02 NOTE — ED Provider Notes (Signed)
?  Face-to-face evaluation ? ? ?History: He presents for evaluation of injury to right hand when he fell, while working.  Unclear etiology.  Unclear if he lost consciousness.  He injured his head and his right hand and apparently was incontinent of urine during the event.  He came by EMS for evaluation ? ?Physical exam: Alert male, who is calm and cooperative.  Head with contusion right forehead.  No dysarthria or aphasia.  Right hand with a laceration over the second MCP, proximal based flap.  Wound is gaping and not bleeding.  It does not exposed tendinous structures.  He is neurovascular intact distally in the right index finger. ? ?MDM: Evaluation for  ?Chief Complaint  ?Patient presents with  ? Extremity Laceration  ?  ? ?Patient with fall, possible syncope, with injury to right hand requiring laceration repair.  No overt sign for CVA or significant head injury.  Patient to be evaluated for syncope, seizure, and cardiac disorders.  I anticipate he can be discharged with outpatient management after laceration repair. ? ?Medical screening examination/treatment/procedure(s) were conducted as a shared visit with non-physician practitioner(s) and myself.  I personally evaluated the patient during the encounter ? ?  ?Daleen Bo, MD ?11/02/21 2156 ? ?

## 2021-11-02 NOTE — ED Notes (Signed)
Patient transported to X-ray 

## 2021-11-02 NOTE — ED Provider Notes (Signed)
?[facility] EMERGENCY DEPARTMENT ?Provider Note ? ? ?CSN: [id number] ?Arrival date & time: 11/02/21  0747 ? ?  ? ?History ? ?Chief Complaint  ?Patient presents with  ? Extremity Laceration  ? ? ?Luis Marquez is a 61 y.o. male with no known past medical history presenting today with a right hand laceration.  He reports that he has a "bad right leg" and it causes him to limp quickly.  Says that he tripped and fell onto his right hand on the sidewalk.  Also endorses hitting his head but denies loss of consciousness.  Patient also urinated on himself at that time, no history of seizure or syncope.  Denies any numbness or tingling or limitations in range of motion of the right hand.  Last tetanus shot 10 years ago. ? ? ?HPI ? ?  ? ?Home Medications ?Prior to Admission medications   ?Medication Sig Start Date End Date Taking? Authorizing Provider  ?albuterol (VENTOLIN HFA) 108 (90 Base) MCG/ACT inhaler INHALE 2 PUFFS INTO THE LUNGS EVERY 6 (SIX) HOURS AS NEEDED FOR WHEEZING OR SHORTNESS OF BREATH (COUGH). 05/10/21   Claiborne Rigg, NP  ?amLODipine (NORVASC) 10 MG tablet TAKE 1 TABLET (10 MG TOTAL) BY MOUTH DAILY. 09/14/20 10/02/21  Claiborne Rigg, NP  ?ferrous sulfate 325 (65 FE) MG tablet Take 1 tablet (325 mg total) by mouth 2 (two) times daily with a meal. 03/05/20 06/03/20  Claiborne Rigg, NP  ?fluticasone furoate-vilanterol (BREO ELLIPTA) 100-25 MCG/ACT AEPB Inhale 1 puff into the lungs daily. 06/07/21   Hoy Register, MD  ?furosemide (LASIX) 20 MG tablet Take 1 tablet (20 mg total) by mouth daily as needed. 06/07/21 07/07/21  Hoy Register, MD  ?gabapentin (NEURONTIN) 300 MG capsule Take 1 capsule (300 mg total) by mouth at bedtime. 06/18/21 10/02/21  Claiborne Rigg, NP  ?GLUCOS-CHONDROIT-MSM-C-HYAL PO Take 1 tablet by mouth daily.    [provider]  ?lisinopril (ZESTRIL) 20 MG tablet TAKE 1 TABLET (20 MG TOTAL) BY MOUTH DAILY. 05/10/21 05/10/22  Claiborne Rigg, NP  ?Multiple  Vitamin (MULTIVITAMIN WITH MINERALS) TABS tablet Take 1 tablet by mouth daily.    [provider]  ?   ? ?Allergies    ?Patient has no known allergies.   ? ?Review of Systems   ?Review of Systems ? ?Physical Exam ?Updated Vital Signs ?BP (!) 134/109   Pulse 68   Temp 98.4 ?F (36.9 ?C) (Oral)   Resp 16   SpO2 92%  ?Physical Exam ?Vitals and nursing note reviewed.  ?Constitutional:   ?   General: He is not in acute distress. ?   Appearance: Normal appearance. He is not ill-appearing.  ?HENT:  ?   Head: Normocephalic and atraumatic.  ?Eyes:  ?   General: No scleral icterus. ?   Conjunctiva/sclera: Conjunctivae normal.  ?   Pupils: Pupils are equal, round, and reactive to light.  ?Cardiovascular:  ?   Rate and Rhythm: Regular rhythm. Tachycardia present.  ?Pulmonary:  ?   Effort: Pulmonary effort is normal. No respiratory distress.  ?   Breath sounds: No wheezing.  ?Musculoskeletal:  ?   Comments: Full range of motion of bilateral hands.  Full range of motion of MCPs, PIPs and DIPs.  Normal cap refill, strong radial pulse.  Neurologically intact to light and dull sensation distally ? ?Photo of wound is in patient's chart  ?Skin: ?   General: Skin is warm and dry.  ?   Findings: No rash.  ?  Neurological:  ?   General: No focal deficit present.  ?   Mental Status: He is alert and oriented to person, place, and time.  ?   Cranial Nerves: No cranial nerve deficit.  ?   Motor: No weakness.  ?   Comments: Moving all extremities, 5 out of 5 strength throughout.  Cranial nerves intact.  ?Psychiatric:     ?   Mood and Affect: Mood normal.  ?  ?Media Information ?Document Information ? ? ? ?ED Results / Procedures / Treatments   ?Labs ?(all labs ordered are listed, but only abnormal results are displayed) ?Labs Reviewed  ?CBC - Abnormal; Notable for the following components:  ?    Result Value  ? RBC 3.95 (*)   ? HCT 37.3 (*)   ? All other components within normal limits  ?COMPREHENSIVE METABOLIC PANEL - Abnormal;  Notable for the following components:  ? Sodium 134 (*)   ? Potassium <2.0 (*)   ? Chloride 73 (*)   ? CO2 >45 (*)   ? Glucose, Bld 104 (*)   ? Creatinine, Ser 1.69 (*)   ? Calcium 14.5 (*)   ? Total Protein 5.4 (*)   ? Albumin 2.5 (*)   ? AST 165 (*)   ? ALT 64 (*)   ? Total Bilirubin 1.7 (*)   ? GFR, Estimated 45 (*)   ? All other components within normal limits  ?CBG MONITORING, ED - Abnormal; Notable for the following components:  ? Glucose-Capillary 128 (*)   ? All other components within normal limits  ?LIPASE, BLOOD  ?MAGNESIUM  ? ? ?EKG ?None ? ?Radiology ?CT Head Wo Contrast ? ?Result Date: 11/02/2021 ?CLINICAL DATA:  A 61 year old male presents for evaluation of head trauma. EXAM: CT HEAD WITHOUT CONTRAST TECHNIQUE: Contiguous axial images were obtained from the base of the skull through the vertex without intravenous contrast. RADIATION DOSE REDUCTION: This exam was performed according to the departmental dose-optimization program which includes automated exposure control, adjustment of the mA and/or kV according to patient size and/or use of iterative reconstruction technique. COMPARISON:  None FINDINGS: Brain: No evidence of acute infarction, hemorrhage, hydrocephalus, extra-axial collection or mass lesion/mass effect. Signs of atrophy and chronic microvascular ischemic change. Vascular: No hyperdense vessel or unexpected calcification. Skull: Normal. Negative for fracture or focal lesion. Sinuses/Orbits: Visualized paranasal sinuses and orbits are unremarkable. Other: None IMPRESSION: 1. No acute intracranial abnormality. 2. Signs of atrophy and chronic microvascular ischemic change. Electronically Signed   By: Donzetta Kohut M.D.   On: 11/02/2021 13:51  ? ?DG Hand Complete Right ? ?Result Date: 11/02/2021 ?CLINICAL DATA:  Trauma, fall EXAM: RIGHT HAND - COMPLETE 3+ VIEW COMPARISON:  None. FINDINGS: There is deformity in the head of the second metacarpal with possible lucency in the medial aspect of neck  of second metacarpal. There is mild angulation in the medial cortex of neck of the second metacarpal. There is 2 mm smooth marginated calcification adjacent to the base of proximal phalanx of index finger possibly old avulsion. Degenerative changes are noted in the multiple metacarpophalangeal joints, more so in the first, second and third metacarpophalangeal joints. IMPRESSION: Deformity in the medial aspect of neck of right second metacarpal may suggest recent or old undisplaced fracture. 2 mm smooth marginated calcification adjacent to the base of proximal phalanx of index finger may be residual from previous injury. Degenerative changes are noted metacarpophalangeal joints, more prominent in the first, second and third metacarpophalangeal joints. Electronically Signed  By: Ernie Avena M.D.   On: 11/02/2021 12:59   ? ?Procedures ?Marland Kitchen.Laceration Repair ? ?Date/Time: 11/02/2021 2:59 PM ?Performed by: Saddie Benders, PA-C ?Authorized by: Saddie Benders, PA-C  ? ?Consent:  ?  Consent obtained:  Verbal ?  Consent given by:  Patient ?  Risks discussed:  Infection, pain, poor cosmetic result, need for additional repair, poor wound healing and tendon damage ?Universal protocol:  ?  Procedure explained and questions answered to patient or proxy's satisfaction: yes   ?  Imaging studies available: yes   ?  Patient identity confirmed:  Verbally with patient ?Anesthesia:  ?  Anesthesia method:  Local infiltration ?  Local anesthetic:  Bupivacaine 0.5% w/o epi ?Laceration details:  ?  Location:  Hand ?  Hand location:  R palm ?  Length (cm):  5 ?Pre-procedure details:  ?  Preparation:  Patient was prepped and draped in usual sterile fashion and imaging obtained to evaluate for foreign bodies ?Exploration:  ?  Hemostasis achieved with:  Direct pressure ?  Imaging obtained: x-ray   ?  Wound exploration: wound explored through full range of motion   ?  Contaminated: no   ?Treatment:  ?  Area cleansed with:   Povidone-iodine and saline ?  Amount of cleaning:  Extensive ?  Irrigation solution:  Sterile water ?  Irrigation volume:  500cc ?  Irrigation method:  Syringe and pressure wash ?  Debridement:  None ?  Undermining:  N

## 2021-11-02 NOTE — ED Notes (Signed)
Return from imaging and got patient hooked up to monitors and incl oxygen ?

## 2021-11-02 NOTE — ED Notes (Signed)
RN placed pt on 2L of O 2 ?

## 2021-11-02 NOTE — ED Notes (Signed)
Doctor states he is okay with the O2 being 88-92%  ?

## 2021-11-02 NOTE — ED Triage Notes (Signed)
Pt. Stated he fell in the parking lot at work.  ?He has urinated in his pants and had his shoes on the wrong foot. ?

## 2021-11-03 ENCOUNTER — Other Ambulatory Visit: Payer: Self-pay

## 2021-11-03 DIAGNOSIS — F10939 Alcohol use, unspecified with withdrawal, unspecified: Secondary | ICD-10-CM | POA: Diagnosis not present

## 2021-11-03 DIAGNOSIS — E876 Hypokalemia: Secondary | ICD-10-CM | POA: Diagnosis present

## 2021-11-03 DIAGNOSIS — N179 Acute kidney failure, unspecified: Secondary | ICD-10-CM

## 2021-11-03 DIAGNOSIS — I10 Essential (primary) hypertension: Secondary | ICD-10-CM | POA: Diagnosis present

## 2021-11-03 DIAGNOSIS — R911 Solitary pulmonary nodule: Secondary | ICD-10-CM | POA: Diagnosis present

## 2021-11-03 DIAGNOSIS — S61419A Laceration without foreign body of unspecified hand, initial encounter: Secondary | ICD-10-CM | POA: Diagnosis present

## 2021-11-03 DIAGNOSIS — S0191XA Laceration without foreign body of unspecified part of head, initial encounter: Secondary | ICD-10-CM | POA: Diagnosis present

## 2021-11-03 DIAGNOSIS — F10930 Alcohol use, unspecified with withdrawal, uncomplicated: Secondary | ICD-10-CM

## 2021-11-03 DIAGNOSIS — J439 Emphysema, unspecified: Secondary | ICD-10-CM | POA: Insufficient documentation

## 2021-11-03 HISTORY — DX: Laceration without foreign body of unspecified part of head, initial encounter: S01.91XA

## 2021-11-03 LAB — BASIC METABOLIC PANEL
Anion gap: 7 (ref 5–15)
Anion gap: 9 (ref 5–15)
BUN: 17 mg/dL (ref 8–23)
BUN: 18 mg/dL (ref 8–23)
BUN: 19 mg/dL (ref 8–23)
CO2: >45 mmol/L — ABNORMAL HIGH (ref 22–32)
CO2: 45 mmol/L — ABNORMAL HIGH (ref 22–32)
CO2: 44 mmol/L — ABNORMAL HIGH (ref 22–32)
Calcium: 12.4 mg/dL — ABNORMAL HIGH (ref 8.9–10.3)
Calcium: 12.7 mg/dL — ABNORMAL HIGH (ref 8.9–10.3)
Calcium: 12.2 mg/dL — ABNORMAL HIGH (ref 8.9–10.3)
Chloride: 83 mmol/L — ABNORMAL LOW (ref 98–111)
Chloride: 84 mmol/L — ABNORMAL LOW (ref 98–111)
Chloride: 85 mmol/L — ABNORMAL LOW (ref 98–111)
Creatinine, Ser: 1.46 mg/dL — ABNORMAL HIGH (ref 0.61–1.24)
Creatinine, Ser: 1.63 mg/dL — ABNORMAL HIGH (ref 0.61–1.24)
Creatinine, Ser: 1.62 mg/dL — ABNORMAL HIGH (ref 0.61–1.24)
GFR, Estimated: 54 mL/min — ABNORMAL LOW (ref >60)
GFR, Estimated: 47 mL/min — ABNORMAL LOW (ref >60)
GFR, Estimated: 47 mL/min — ABNORMAL LOW (ref >60)
Glucose, Bld: 102 mg/dL — ABNORMAL HIGH (ref 70–99)
Glucose, Bld: 101 mg/dL — ABNORMAL HIGH (ref 70–99)
Glucose, Bld: 92 mg/dL (ref 70–99)
Potassium: 2.5 mmol/L — CL (ref 3.5–5.1)
Potassium: 2.7 mmol/L — CL (ref 3.5–5.1)
Potassium: 3.5 mmol/L (ref 3.5–5.1)
Sodium: 136 mmol/L (ref 135–145)
Sodium: 136 mmol/L (ref 135–145)
Sodium: 138 mmol/L (ref 135–145)

## 2021-11-03 LAB — TSH: TSH: 0.527 u[IU]/mL (ref 0.350–4.500)

## 2021-11-03 LAB — MAGNESIUM
Magnesium: 2.2 mg/dL (ref 1.7–2.4)
Magnesium: 2.1 mg/dL (ref 1.7–2.4)
Magnesium: 1.9 mg/dL (ref 1.7–2.4)
Magnesium: 1.7 mg/dL (ref 1.7–2.4)

## 2021-11-03 LAB — PHOSPHORUS: Phosphorus: 2 mg/dL — ABNORMAL LOW (ref 2.5–4.6)

## 2021-11-03 LAB — VITAMIN D 25 HYDROXY (VIT D DEFICIENCY, FRACTURES): Vit D, 25-Hydroxy: 65.73 ng/mL (ref 30–100)

## 2021-11-03 MED ORDER — POTASSIUM CHLORIDE 20 MEQ PO PACK: 40.0000 meq | PACK | Freq: Two times a day (BID) | ORAL | Status: DC

## 2021-11-03 MED ORDER — DILTIAZEM HCL-DEXTROSE 125-5 MG/125ML-% IV SOLN (PREMIX)
5.0000 mg/h | INTRAVENOUS | Status: DC
  Administered 2021-11-03: 5 mg/h via INTRAVENOUS
  Administered 2021-11-04: 15 mg/h via INTRAVENOUS
  Filled 2021-11-03 (×3): qty 125

## 2021-11-03 MED ORDER — IOHEXOL 300 MG/ML  SOLN
40.0000 mL | Freq: Once | INTRAMUSCULAR | Status: AC | PRN
  Administered 2021-11-03: 40 mL via INTRAVENOUS

## 2021-11-03 MED ORDER — MAGNESIUM SULFATE 2 GM/50ML IV SOLN
2.0000 g | Freq: Once | INTRAVENOUS | Status: AC
  Administered 2021-11-03: 2 g via INTRAVENOUS
  Filled 2021-11-03: qty 50

## 2021-11-03 MED ORDER — ORAL CARE MOUTH RINSE
15.0000 mL | Freq: Two times a day (BID) | OROMUCOSAL | Status: DC
  Administered 2021-11-03 – 2021-11-16 (×25): 15 mL via OROMUCOSAL

## 2021-11-03 MED ORDER — POTASSIUM CHLORIDE 20 MEQ PO PACK
40.0000 meq | PACK | Freq: Once | ORAL | Status: AC
  Administered 2021-11-03: 40 meq via ORAL
  Filled 2021-11-03: qty 2

## 2021-11-03 MED ORDER — POTASSIUM CHLORIDE 10 MEQ/100ML IV SOLN
10.0000 meq | INTRAVENOUS | Status: AC
  Administered 2021-11-03 (×4): 10 meq via INTRAVENOUS
  Filled 2021-11-03 (×4): qty 100

## 2021-11-03 MED ORDER — POTASSIUM CHLORIDE CRYS ER 20 MEQ PO TBCR
40.0000 meq | EXTENDED_RELEASE_TABLET | Freq: Once | ORAL | Status: AC
  Administered 2021-11-03: 20 meq via ORAL
  Filled 2021-11-03: qty 2

## 2021-11-03 MED ORDER — SODIUM CHLORIDE 0.9 % IV SOLN: INTRAVENOUS | Status: AC

## 2021-11-03 MED ORDER — DILTIAZEM LOAD VIA INFUSION
20.0000 mg | Freq: Once | INTRAVENOUS | Status: AC
  Administered 2021-11-03: 20 mg via INTRAVENOUS
  Filled 2021-11-03: qty 20

## 2021-11-03 MED ORDER — CHLORDIAZEPOXIDE HCL 25 MG PO CAPS
25.0000 mg | ORAL_CAPSULE | Freq: Two times a day (BID) | ORAL | Status: DC
  Administered 2021-11-03: 25 mg via ORAL
  Filled 2021-11-03: qty 1

## 2021-11-03 MED ORDER — K PHOS MONO-SOD PHOS DI & MONO 155-852-130 MG PO TABS
250.0000 mg | ORAL_TABLET | Freq: Once | ORAL | Status: AC
  Administered 2021-11-03: 250 mg via ORAL
  Filled 2021-11-03: qty 1

## 2021-11-03 MED ORDER — LORAZEPAM 1 MG PO TABS
1.0000 mg | ORAL_TABLET | ORAL | Status: AC | PRN
  Administered 2021-11-03: 2 mg via ORAL
  Filled 2021-11-03: qty 2

## 2021-11-03 MED ORDER — LORAZEPAM 2 MG/ML IJ SOLN
1.0000 mg | INTRAMUSCULAR | Status: AC | PRN
  Administered 2021-11-03 – 2021-11-05 (×10): 2 mg via INTRAVENOUS
  Filled 2021-11-03 (×7): qty 1

## 2021-11-03 MED ORDER — CALCITONIN (SALMON) 200 UNIT/ML IJ SOLN
4.0000 [IU]/kg | Freq: Once | INTRAMUSCULAR | Status: AC
  Administered 2021-11-03: 382 [IU] via INTRAMUSCULAR
  Filled 2021-11-03: qty 1.91

## 2021-11-03 NOTE — Progress Notes (Signed)
Received message from RN about new onset afib with RVR, HR In the 150s.  Patient seen at bedside with RN present.  Patient was sleepy, belly-breathing, and only minimally responsive to pain.  RN states that patient has been in the same condition since beginning of shift.  Review of documentation shows that patient was in similar state earlier this morning. ? ?Patient is sleepy and does not respond to questions, minimally responsive to pain.  Tachycardic, irregular rate, no BLE swelling noted.  ? ?EKG was obtained and shows A-fib with RVR, heart rate 143. ? ?Plan: ?Patient in A-fib, which appears to be new for him.  Most likely related to his alcohol withdrawal.  Will obtain BMP, mag.  Will replete electrolytes as well.  Started diltiazem drip.  Consider echo once heart rate has improved. ? ? ?

## 2021-11-03 NOTE — Progress Notes (Signed)
?  Transition of Care (TOC) Screening Note ? ? ?Patient Details  ?Name: Luis Marquez ?Date of Birth: 1960/08/02 ? ? ?Transition of Care (TOC) CM/SW Contact:    ?Cyndi Bender, RN ?Phone Number: ?11/03/2021, 9:03 AM ? ? ? ?Transition of Care Department [facility]) has reviewed patient  We will continue to monitor patient advancement through interdisciplinary progression rounds. TOC consult for substance abuse counseling  ?

## 2021-11-03 NOTE — Evaluation (Signed)
Physical Therapy Evaluation ?Patient Details ?Name: Luis Marquez ?MRN: [id number] ?DOB: [birth year] ?Today's Date: 11/03/2021 ? ?History of Present Illness ? 61 y/o male presented to ED on 11/02/21 after fall in parking lot and hitting R hand with urinary incontinence. CT head negative. Admitted for severe electrolyte imbalance, AKI, and ETOH withdrawal. PMH: HTN, alcohol use disorder  ?Clinical Impression ? Patient admitted for the above. Patient currently lethargic and restless. Patient not following commands and difficult to understand due to slurred speech. Patient requiring totalA for all aspects of bed mobility. Patient unable to provide PLOF or home setup. From chart review, patient was independent and working. Patient will benefit from skilled PT services during acute stay to address listed deficits. Recommend SNF at this time to maximize functional mobility. Will continue to assess as patient's cognitive status improves and family assist available.    ?   ? ?Recommendations for follow up therapy are one component of a multi-disciplinary discharge planning process, led by the attending physician.  Recommendations may be updated based on patient status, additional functional criteria and insurance authorization. ? ?Follow Up Recommendations Skilled nursing-short term rehab (<3 hours/day) ? ?  ?Assistance Recommended at Discharge Frequent or constant Supervision/Assistance  ?Patient can return home with the following ? A lot of help with walking and/or transfers;A lot of help with bathing/dressing/bathroom;Assistance with cooking/housework;Direct supervision/assist for medications management;Direct supervision/assist for financial management;Assist for transportation;Help with stairs or ramp for entrance ? ?  ?Equipment Recommendations Other (comment) (TBD)  ?Recommendations for Other Services ?    ?  ?Functional Status Assessment Patient has had a recent decline in their functional status and demonstrates the  ability to make significant improvements in function in a reasonable and predictable amount of time.  ? ?  ?Precautions / Restrictions Precautions ?Precautions: Fall ?Precaution Comments: CIWA, R hand laceration ?Restrictions ?Weight Bearing Restrictions: No  ? ?  ? ?Mobility ? Bed Mobility ?Overal bed mobility: Needs Assistance ?Bed Mobility: Supine to Sit, Sit to Supine ?  ?  ?Supine to sit: Total assist ?Sit to supine: Total assist ?  ?General bed mobility comments: totalA for all aspects of bed mobility to achieve EOB. Patient resistive of movements at times. Deferred further due to lack of +2 and lethargy ?  ? ?Transfers ?  ?  ?  ?  ?  ?  ?  ?  ?  ?  ?  ? ?Ambulation/Gait ?  ?  ?  ?  ?  ?  ?  ?  ? ?Stairs ?  ?  ?  ?  ?  ? ?Wheelchair Mobility ?  ? ?Modified Rankin (Stroke Patients Only) ?  ? ?  ? ?Balance Overall balance assessment: Needs assistance ?Sitting-balance support: No upper extremity supported, Feet supported ?Sitting balance-Leahy Scale: Zero ?  ?  ?  ?  ?  ?  ?  ?  ?  ?  ?  ?  ?  ?  ?  ?  ?   ? ? ? ?Pertinent Vitals/Pain Pain Assessment ?Pain Assessment: CPOT ?Facial Expression: Tense ?Body Movements: Restlessness ?Muscle Tension: Tense, rigid ?Compliance with ventilator (intubated pts.): N/A ?Vocalization (extubated pts.): Sighing, moaning ?CPOT Total: 5 ?Pain Intervention(s): Monitored during session  ? ? ?Home Living Family/patient expects to be discharged to:: Private residence ?  ?  ?  ?  ?  ?  ?  ?  ?  ?Additional Comments: unable to understand patient due to slurred speech  ?  ?Prior Function Prior Level of  Function : Independent/Modified Independent;Working/employed ?  ?  ?  ?  ?  ?  ?Mobility Comments: per chart review, patient was working and independent ?  ?  ? ? ?Hand Dominance  ?   ? ?  ?Extremity/Trunk Assessment  ? Upper Extremity Assessment ?Upper Extremity Assessment: Generalized weakness ?  ? ?Lower Extremity Assessment ?Lower Extremity Assessment: Generalized weakness ?  ? ?    ?Communication  ? Communication: Expressive difficulties (slurred speech)  ?Cognition Arousal/Alertness: Lethargic ?Behavior During Therapy: Restless ?Overall Cognitive Status: Difficult to assess ?  ?  ?  ?  ?  ?  ?  ?  ?  ?  ?  ?  ?  ?  ?  ?  ?General Comments: not following commands, difficult to understand due to slurred speech. ?  ?  ? ?  ?General Comments General comments (skin integrity, edema, etc.): HR max 135 during session. spO2 >90% on 2L O2 [place] ? ?  ?Exercises    ? ?Assessment/Plan  ?  ?PT Assessment Patient needs continued PT services  ?PT Problem List Decreased strength;Decreased activity tolerance;Decreased balance;Decreased mobility;Decreased coordination;Decreased cognition;Decreased knowledge of use of DME;Decreased safety awareness;Decreased knowledge of precautions;Cardiopulmonary status limiting activity ? ?   ?  ?PT Treatment Interventions DME instruction;Gait training;Functional mobility training;Therapeutic exercise;Therapeutic activities;Balance training;Patient/family education   ? ?PT Goals (Current goals can be found in the Care Plan section)  ?Acute Rehab PT Goals ?Patient Stated Goal: did not state ?PT Goal Formulation: Patient unable to participate in goal setting ?Time For Goal Achievement: 11/17/21 ?Potential to Achieve Goals: Fair ? ?  ?Frequency Min 2X/week ?  ? ? ?Co-evaluation   ?  ?  ?  ?  ? ? ?  ?AM-PAC PT "6 Clicks" Mobility  ?Outcome Measure Help needed turning from your back to your side while in a flat bed without using bedrails?: Total ?Help needed moving from lying on your back to sitting on the side of a flat bed without using bedrails?: Total ?Help needed moving to and from a bed to a chair (including a wheelchair)?: Total ?Help needed standing up from a chair using your arms (e.g., wheelchair or bedside chair)?: Total ?Help needed to walk in hospital room?: Total ?Help needed climbing 3-5 steps with a railing? : Total ?6 Click Score: 6 ? ?  ?End of Session Equipment  Utilized During Treatment: Oxygen ?Activity Tolerance: Patient limited by lethargy ?Patient left: in bed;with call bell/phone within reach;with bed alarm set ?Nurse Communication: Mobility status ?PT Visit Diagnosis: Muscle weakness (generalized) (M62.81);Other abnormalities of gait and mobility (R26.89) ?  ? ?Time: [id number] ?PT Time Calculation (min) (ACUTE ONLY): 12 min ? ? ?Charges:   PT Evaluation ?$PT Eval Moderate Complexity: 1 Mod ?  ?  ?   ? ? ?Harlin Mazzoni A. Gilford Rile, PT, DPT ?Acute Rehabilitation Services ?Pager [phone] ?Office [phone] ? ? ?Valentine Barney A Radley Teston ?11/03/2021, 4:32 PM ? ?

## 2021-11-03 NOTE — Progress Notes (Signed)
? ?HD#1 ?SUBJECTIVE:  ?Patient Summary: Luis Marquez is a 61 y.o. with a pertinent PMH of hypertension, emphysema, and prior tobacco use, who presented with fall and admitted for severe symptomatic hypercalcemia.  ? ?Overnight Events: CIWA score of 12.  Patient was given Ativan. ? ?Interim History: Patient assessed at bedside this AM.  He is unable to answer questions.  He is tremulous on exam. ? ?OBJECTIVE:  ?Vital Signs: ?Vitals:  ? 11/02/21 1949 11/02/21 2015 11/02/21 2141 11/03/21 0319  ?BP: (!) 138/100 (!) 142/84 (!) 172/98 (!) 141/95  ?Pulse: 86 83 92 76  ?Resp: 12 14 19 18   ?Temp: 98.4 ?F (36.9 ?C)  97.9 ?F (36.6 ?C) 98 ?F (36.7 ?C)  ?TempSrc: Oral  Oral Oral  ?SpO2: 92% 93% 92% 94%  ?Weight:   95.3 kg   ? ?Supplemental O2: Nasal Cannula ?SpO2: 94 % ?O2 Flow Rate (L/min): 2 L/min ? ?Filed Weights  ? 11/02/21 2141  ?Weight: 95.3 kg  ? ? ? ?Intake/Output Summary (Last 24 hours) at 11/03/2021 [id number] ?Last data filed at 11/03/2021 0341 ?Gross per 24 hour  ?Intake 1149 ml  ?Output 450 ml  ?Net 699 ml  ? ?Net IO Since Admission: 699 mL [11/03/21 0628] ? ?Physical Exam: ?Physical Exam ?Constitutional:   ?   General: He is not in acute distress. ?   Comments: sleepy  ?HENT:  ?   Head: Normocephalic.  ?Cardiovascular:  ?   Rate and Rhythm: Regular rhythm. Tachycardia present.  ?   Heart sounds: Normal heart sounds.  ?Pulmonary:  ?   Effort: Pulmonary effort is normal.  ?   Breath sounds: Normal breath sounds.  ?   Comments: Stable oxygen saturations on 2 L nasal cannula ?Abdominal:  ?   General: Abdomen is flat.  ?   Palpations: Abdomen is soft.  ?Musculoskeletal:  ?   Comments: Bandage in place over right hand, tremulous  ?Skin: ?   General: Skin is warm and dry.  ?Neurological:  ?   Mental Status: He is disoriented.  ?   ? ?Patient Lines/Drains/Airways Status   ? ? Active Line/Drains/Airways   ? ? Name Placement date Placement time Site Days  ? Peripheral IV 11/02/21 20 G Right Antecubital 11/02/21  1511  Antecubital   1  ? External Urinary Catheter 11/03/21  0340  --  less than 1  ? Incision (Closed) 11/11/14 Hip Right 11/11/14  2212  -- 2549  ? ?  ?  ? ?  ? ? ?Pertinent Labs: ? ?  Latest Ref Rng & Units 11/02/2021  ?  1:13 PM 03/02/2021  ?  3:07 PM 03/04/2020  ?  3:03 PM  ?CBC  ?WBC 4.0 - 10.5 K/uL 9.4   6.6   6.4    ?Hemoglobin 13.0 - 17.0 g/dL 13.4   13.1   13.7    ?Hematocrit 39.0 - 52.0 % 37.3   37.5   38.7    ?Platelets 150 - 400 K/uL 155   241   254    ? ? ? ?  Latest Ref Rng & Units 11/03/2021  ?  2:23 AM 11/02/2021  ? 10:23 PM 11/02/2021  ?  1:13 PM  ?CMP  ?Glucose 70 - 99 mg/dL 102   103   104    ? 104    ?BUN 8 - 23 mg/dL 17   17   18     ? 18    ?Creatinine 0.61 - 1.24 mg/dL 1.46   1.61  1.69    ? 1.79    ?Sodium 135 - 145 mmol/L 136   138   134    ? 136    ?Potassium 3.5 - 5.1 mmol/L 2.5   2.7   <2.0    ? 2.1    ?Chloride 98 - 111 mmol/L 83   82   73    ? 74    ?CO2 22 - 32 mmol/L >45   45   >45    ? >45    ?Calcium 8.9 - 10.3 mg/dL 12.4   13.6   14.5    ? 14.3    ?Total Protein 6.5 - 8.1 g/dL   5.4    ?Total Bilirubin 0.3 - 1.2 mg/dL   1.7    ?Alkaline Phos 38 - 126 U/L   103    ?AST 15 - 41 U/L   165    ?ALT 0 - 44 U/L   64    ? ? ?Recent Labs  ?  11/02/21 ?0804  ?GLUCAP 128*  ?  ? ?Pertinent Imaging: ?DG Pelvis 1-2 Views ? ?Result Date: 11/02/2021 ?CLINICAL DATA:  Fall. EXAM: PELVIS - 1-2 VIEW COMPARISON:  None. FINDINGS: Right hip screw is partially visualized. There is no evidence for hardware loosening or acute fracture. There is no dislocation. There are severe degenerative changes of the right hip with joint space narrowing and osteophyte formation. Surgical coils overlie the left lower pelvis. There are likely prostate calcifications present. IMPRESSION: 1. No acute bony abnormality. 2. Right hip screw in place. 3. Severe degenerative changes of the right hip. Electronically Signed   By: Ronney Asters M.D.   On: 11/02/2021 21:13  ? ?CT Head Wo Contrast ? ?Result Date: 11/02/2021 ?CLINICAL DATA:  A 61 year old male  presents for evaluation of head trauma. EXAM: CT HEAD WITHOUT CONTRAST TECHNIQUE: Contiguous axial images were obtained from the base of the skull through the vertex without intravenous contrast. RADIATION DOSE REDUCTION: This exam was performed according to the departmental dose-optimization program which includes automated exposure control, adjustment of the mA and/or kV according to patient size and/or use of iterative reconstruction technique. COMPARISON:  None FINDINGS: Brain: No evidence of acute infarction, hemorrhage, hydrocephalus, extra-axial collection or mass lesion/mass effect. Signs of atrophy and chronic microvascular ischemic change. Vascular: No hyperdense vessel or unexpected calcification. Skull: Normal. Negative for fracture or focal lesion. Sinuses/Orbits: Visualized paranasal sinuses and orbits are unremarkable. Other: None IMPRESSION: 1. No acute intracranial abnormality. 2. Signs of atrophy and chronic microvascular ischemic change. Electronically Signed   By: Zetta Bills M.D.   On: 11/02/2021 13:51  ? ?CT CHEST W CONTRAST ? ?Result Date: 11/03/2021 ?CLINICAL DATA:  Hypercalcemia. EXAM: CT CHEST WITH CONTRAST TECHNIQUE: Multidetector CT imaging of the chest was performed during intravenous contrast administration. RADIATION DOSE REDUCTION: This exam was performed according to the departmental dose-optimization program which includes automated exposure control, adjustment of the mA and/or kV according to patient size and/or use of iterative reconstruction technique. CONTRAST:  67mL OMNIPAQUE IOHEXOL 300 MG/ML  SOLN COMPARISON:  None. FINDINGS: Cardiovascular: No significant vascular findings. Normal heart size. No pericardial effusion. There are atherosclerotic calcifications of the aorta and coronary arteries. Mediastinum/Nodes: No enlarged mediastinal, hilar, or axillary lymph nodes. Thyroid gland, trachea, and esophagus demonstrate no significant findings. Lungs/Pleura: Mild emphysematous  changes are present. There is some ill-defined ground-glass and solid nodular densities in the posterior right upper lobe and right lower lobe measuring up to 6 mm. There is  a 3 mm nodule in the left lower lobe image 4/79. No focal lung consolidation, pleural effusion or pneumothorax. Trachea and central airways are patent. Upper Abdomen: There is fatty infiltration of the liver. Musculoskeletal: There is no evidence for acute fracture or dislocation. The bones are mildly osteopenic. There is chronic comparing compression deformity of L1. IMPRESSION: 1. Ill-defined ground-glass and solid nodular densities in the right upper lobe and right lower lobe measuring up to 6 mm uterus may be infectious/inflammatory, but are indeterminate. Recommend a non-contrast Chest CT at 3-6 months. Subsequent management based on the most suspicious nodule(s). These guidelines do not apply to immunocompromised patients and patients with cancer. Follow up in patients with significant comorbidities as clinically warranted. For lung cancer screening, adhere to Lung-RADS guidelines. Reference: Radiology. 2017; 284(1):228-43. 2. Mild emphysema. 3. Fatty infiltration of the liver. Aortic Atherosclerosis (ICD10-I70.0) and Emphysema (ICD10-J43.9). Electronically Signed   By: Ronney Asters M.D.   On: 11/03/2021 00:37  ? ?DG Hand Complete Right ? ?Result Date: 11/02/2021 ?CLINICAL DATA:  Trauma, fall EXAM: RIGHT HAND - COMPLETE 3+ VIEW COMPARISON:  None. FINDINGS: There is deformity in the head of the second metacarpal with possible lucency in the medial aspect of neck of second metacarpal. There is mild angulation in the medial cortex of neck of the second metacarpal. There is 2 mm smooth marginated calcification adjacent to the base of proximal phalanx of index finger possibly old avulsion. Degenerative changes are noted in the multiple metacarpophalangeal joints, more so in the first, second and third metacarpophalangeal joints. IMPRESSION:  Deformity in the medial aspect of neck of right second metacarpal may suggest recent or old undisplaced fracture. 2 mm smooth marginated calcification adjacent to the base of proximal phalanx of index fi

## 2021-11-03 NOTE — Progress Notes (Signed)
?   11/03/21 2300  ?Assess: MEWS Score  ?BP 124/87  ?Pulse Rate (!) 117  ?ECG Heart Rate (!) 142  ?Resp (!) 39  ?SpO2 91 %  ?Assess: MEWS Score  ?MEWS Temp 0  ?MEWS Systolic 0  ?MEWS Pulse 3  ?MEWS RR 3  ?MEWS LOC 0  ?MEWS Score 6  ?MEWS Score Color Red  ?Assess: if the MEWS score is Yellow or Red  ?Were vital signs taken at a resting state? Yes  ?Focused Assessment Change from prior assessment (see assessment flowsheet)  ?Early Detection of Sepsis Score *See Row Information* Medium  ?MEWS guidelines implemented *See Row Information* Yes  ?Treat  ?MEWS Interventions Escalated (See documentation below);Administered prn meds/treatments  ?Pain Scale Faces  ?Faces Pain Scale 2  ?Take Vital Signs  ?Increase Vital Sign Frequency  Red: Q 1hr X 4 then Q 4hr X 4, if remains red, continue Q 4hrs  ?Escalate  ?MEWS: Escalate Red: discuss with charge nurse/RN and provider, consider discussing with RRT  ?Notify: Charge Nurse/RN  ?Name of Charge Nurse/RN Notified Darl Pikes RN  ?Date Charge Nurse/RN Notified 11/02/21  ?Time Charge Nurse/RN Notified 2330  ?Notify: Provider  ?Provider Name/Title Alroy Bailiff MD  ?Date Provider Notified 11/02/21  ?Time Provider Notified 2237  ?Notification Type Page  ?Notification Reason Change in status ?(afib RVR)  ?Provider response En route  ?Date of Provider Response 11/02/21  ?Time of Provider Response 2238  ?Document  ?Patient Outcome Stabilized after interventions  ? ? ?

## 2021-11-04 ENCOUNTER — Inpatient Hospital Stay (HOSPITAL_COMMUNITY): Payer: Medicaid Other

## 2021-11-04 DIAGNOSIS — F10931 Alcohol use, unspecified with withdrawal delirium: Secondary | ICD-10-CM

## 2021-11-04 DIAGNOSIS — I5021 Acute systolic (congestive) heart failure: Secondary | ICD-10-CM | POA: Diagnosis not present

## 2021-11-04 DIAGNOSIS — I4891 Unspecified atrial fibrillation: Secondary | ICD-10-CM

## 2021-11-04 LAB — CBC
HCT: 35.8 % — ABNORMAL LOW (ref 39.0–52.0)
Hemoglobin: 12.5 g/dL — ABNORMAL LOW (ref 13.0–17.0)
MCH: 34.3 pg — ABNORMAL HIGH (ref 26.0–34.0)
MCHC: 34.9 g/dL (ref 30.0–36.0)
MCV: 98.4 fL (ref 80.0–100.0)
Platelets: 138 10*3/uL — ABNORMAL LOW (ref 150–400)
RBC: 3.64 MIL/uL — ABNORMAL LOW (ref 4.22–5.81)
RDW: 13.2 % (ref 11.5–15.5)
WBC: 7.6 10*3/uL (ref 4.0–10.5)
nRBC: 0 % (ref 0.0–0.2)

## 2021-11-04 LAB — COMPREHENSIVE METABOLIC PANEL
ALT: 63 U/L — ABNORMAL HIGH (ref 0–44)
AST: 139 U/L — ABNORMAL HIGH (ref 15–41)
Albumin: 2.3 g/dL — ABNORMAL LOW (ref 3.5–5.0)
Alkaline Phosphatase: 89 U/L (ref 38–126)
Anion gap: 12 (ref 5–15)
BUN: 30 mg/dL — ABNORMAL HIGH (ref 8–23)
CO2: 36 mmol/L — ABNORMAL HIGH (ref 22–32)
Calcium: 11.2 mg/dL — ABNORMAL HIGH (ref 8.9–10.3)
Chloride: 89 mmol/L — ABNORMAL LOW (ref 98–111)
Creatinine, Ser: 2.04 mg/dL — ABNORMAL HIGH (ref 0.61–1.24)
GFR, Estimated: 36 mL/min — ABNORMAL LOW (ref >60)
Glucose, Bld: 95 mg/dL (ref 70–99)
Potassium: 3.7 mmol/L (ref 3.5–5.1)
Sodium: 137 mmol/L (ref 135–145)
Total Bilirubin: 1.8 mg/dL — ABNORMAL HIGH (ref 0.3–1.2)
Total Protein: 5.1 g/dL — ABNORMAL LOW (ref 6.5–8.1)

## 2021-11-04 LAB — BILIRUBIN, FRACTIONATED(TOT/DIR/INDIR)
Bilirubin, Direct: 0.4 mg/dL — ABNORMAL HIGH (ref 0.0–0.2)
Indirect Bilirubin: 1.4 mg/dL — ABNORMAL HIGH (ref 0.3–0.9)
Total Bilirubin: 1.8 mg/dL — ABNORMAL HIGH (ref 0.3–1.2)

## 2021-11-04 LAB — ECHOCARDIOGRAM COMPLETE
AR max vel: 2.61 cm2
AV Area VTI: 2.55 cm2
AV Area mean vel: 2.46 cm2
AV Mean grad: 3 mmHg
AV Peak grad: 5.8 mmHg
Ao pk vel: 1.2 m/s
Height: 73 in
S' Lateral: 4.35 cm
Weight: 3361.57 oz

## 2021-11-04 LAB — KAPPA/LAMBDA LIGHT CHAINS
Kappa free light chain: 37.5 mg/L — ABNORMAL HIGH (ref 3.3–19.4)
Kappa, lambda light chain ratio: 1.06 (ref 0.26–1.65)
Lambda free light chains: 35.5 mg/L — ABNORMAL HIGH (ref 5.7–26.3)

## 2021-11-04 LAB — CALCITRIOL (1,25 DI-OH VIT D): Vit D, 1,25-Dihydroxy: <5 pg/mL — ABNORMAL LOW (ref 24.8–81.5)

## 2021-11-04 LAB — MAGNESIUM: Magnesium: 2 mg/dL (ref 1.7–2.4)

## 2021-11-04 LAB — PHOSPHORUS: Phosphorus: 2.6 mg/dL (ref 2.5–4.6)

## 2021-11-04 MED ORDER — SODIUM CHLORIDE 0.9 % IV SOLN: INTRAVENOUS | Status: AC

## 2021-11-04 MED ORDER — LORAZEPAM 2 MG/ML IJ SOLN
0.0000 mg | Freq: Three times a day (TID) | INTRAMUSCULAR | Status: DC
  Filled 2021-11-04 (×3): qty 1

## 2021-11-04 MED ORDER — AMIODARONE HCL IN DEXTROSE 360-4.14 MG/200ML-% IV SOLN
60.0000 mg/h | INTRAVENOUS | Status: AC
  Administered 2021-11-04 (×2): 60 mg/h via INTRAVENOUS
  Filled 2021-11-04: qty 200

## 2021-11-04 MED ORDER — AMIODARONE HCL IN DEXTROSE 360-4.14 MG/200ML-% IV SOLN
30.0000 mg/h | INTRAVENOUS | Status: DC
  Administered 2021-11-05 – 2021-11-08 (×7): 30 mg/h via INTRAVENOUS
  Filled 2021-11-04 (×10): qty 200

## 2021-11-04 MED ORDER — POTASSIUM CHLORIDE 10 MEQ/100ML IV SOLN
10.0000 meq | INTRAVENOUS | Status: AC
  Administered 2021-11-04 (×3): 10 meq via INTRAVENOUS
  Filled 2021-11-04 (×3): qty 100

## 2021-11-04 MED ORDER — LORAZEPAM 2 MG/ML IJ SOLN
0.0000 mg | INTRAMUSCULAR | Status: AC
  Administered 2021-11-04 (×2): 2 mg via INTRAVENOUS
  Administered 2021-11-04: 1 mg via INTRAVENOUS
  Administered 2021-11-04 – 2021-11-05 (×5): 2 mg via INTRAVENOUS
  Administered 2021-11-05: 1 mg via INTRAVENOUS
  Administered 2021-11-05 – 2021-11-06 (×3): 2 mg via INTRAVENOUS
  Filled 2021-11-04 (×12): qty 1

## 2021-11-04 MED ORDER — PERFLUTREN LIPID MICROSPHERE
1.0000 mL | INTRAVENOUS | Status: AC | PRN
  Administered 2021-11-04: 2 mL via INTRAVENOUS

## 2021-11-04 MED ORDER — AMIODARONE LOAD VIA INFUSION
150.0000 mg | Freq: Once | INTRAVENOUS | Status: AC
  Administered 2021-11-04: 150 mg via INTRAVENOUS

## 2021-11-04 MED ORDER — ACETAMINOPHEN 325 MG PO TABS: 650.0000 mg | ORAL_TABLET | Freq: Four times a day (QID) | ORAL | Status: DC | PRN

## 2021-11-04 NOTE — Progress Notes (Addendum)
? ?HD#2 ?SUBJECTIVE:  ?Patient Summary: Luis Marquez is a 61 y.o. with a pertinent PMH of hypertension, emphysema, and prior tobacco use, who presented with fall and admitted for severe symptomatic hypercalcemia, now in delirium tremens.  ? ?Overnight Events: New onset afib with RVR, he was started on dilt drip. Temperature up to 101 F. CIWA score of 13.  Patient was given Ativan. ? ?Interim History: Patient assessed at bedside this AM.  He is unable to answer questions.  He is tremulous on exam with accessory muscle use. ? ?OBJECTIVE:  ?Vital Signs: ?Vitals:  ? 11/04/21 0300 11/04/21 0400 11/04/21 0500 11/04/21 0600  ?BP: 113/86 112/82 110/82 117/88  ?Pulse: (!) 118 (!) 132 (!) 125 (!) 136  ?Resp: (!) 34 (!) 32 (!) 32 (!) 35  ?Temp: 98.7 ?F (37.1 ?C) (!) 101 ?F (38.3 ?C)    ?TempSrc: Oral Axillary    ?SpO2: 93% 93% 93% 94%  ?Weight:      ?Height:      ? ?Supplemental O2: Nasal Cannula ?SpO2: 94 % ?O2 Flow Rate (L/min): 4 L/min ? ?Filed Weights  ? 11/02/21 2141 11/03/21 0800  ?Weight: 95.3 kg 95.3 kg  ? ? ? ?Intake/Output Summary (Last 24 hours) at 11/04/2021 0639 ?Last data filed at 11/04/2021 0600 ?Gross per 24 hour  ?Intake 352.59 ml  ?Output 2100 ml  ?Net -1747.41 ml  ? ? ?Net IO Since Admission: -1,048.41 mL [11/04/21 0639] ? ?Physical Exam: ?Physical Exam ?Constitutional:   ?   Appearance: He is ill-appearing and diaphoretic.  ?   Comments: sleepy  ?HENT:  ?   Head: Normocephalic.  ?   Comments: Abrasion present on right temple, sweating ?   Mouth/Throat:  ?   Mouth: Mucous membranes are moist.  ?Cardiovascular:  ?   Rate and Rhythm: Tachycardia present. Rhythm irregular.  ?   Heart sounds: Normal heart sounds. No murmur heard. ?Pulmonary:  ?   Breath sounds: Normal breath sounds.  ?   Comments: Stable oxygen saturations on 2 L nasal cannula, accessory muscle use ?Abdominal:  ?   General: Abdomen is flat.  ?   Palpations: Abdomen is soft.  ?Musculoskeletal:  ?   Comments: Bandage in place over right hand,  tremulous  ?Skin: ?   General: Skin is warm.  ?Neurological:  ?   Mental Status: He is disoriented.  ?   Comments: Does not respond to name  ?   ? ?Patient Lines/Drains/Airways Status   ? ? Active Line/Drains/Airways   ? ? Name Placement date Placement time Site Days  ? Peripheral IV 11/02/21 20 G Right Antecubital 11/02/21  1511  Antecubital  1  ? External Urinary Catheter 11/03/21  0340  --  less than 1  ? Incision (Closed) 11/11/14 Hip Right 11/11/14  2212  -- 2549  ? ?  ?  ? ?  ? ? ?Pertinent Labs: ? ?  Latest Ref Rng & Units 11/04/2021  ?  1:19 AM 11/02/2021  ?  1:13 PM 03/02/2021  ?  3:07 PM  ?CBC  ?WBC 4.0 - 10.5 K/uL 7.6   9.4   6.6    ?Hemoglobin 13.0 - 17.0 g/dL 12.5   13.4   13.1    ?Hematocrit 39.0 - 52.0 % 35.8   37.3   37.5    ?Platelets 150 - 400 K/uL 138   155   241    ? ? ? ?  Latest Ref Rng & Units 11/04/2021  ?  1:19 AM  11/03/2021  ? 10:52 AM 11/03/2021  ?  6:04 AM  ?CMP  ?Glucose 70 - 99 mg/dL 95   92   101    ?BUN 8 - 23 mg/dL 30   19   18     ?Creatinine 0.61 - 1.24 mg/dL 2.04   1.62   1.63    ?Sodium 135 - 145 mmol/L 137   138   136    ?Potassium 3.5 - 5.1 mmol/L 3.7   3.5   2.7    ?Chloride 98 - 111 mmol/L 89   85   84    ?CO2 22 - 32 mmol/L 36   44   45    ?Calcium 8.9 - 10.3 mg/dL 11.2   12.2   12.7    ?Total Protein 6.5 - 8.1 g/dL 5.1      ?Total Bilirubin 0.3 - 1.2 mg/dL 1.8      ?Alkaline Phos 38 - 126 U/L 89      ?AST 15 - 41 U/L 139      ?ALT 0 - 44 U/L 63      ? ? ?Recent Labs  ?  11/02/21 ?0804  ?GLUCAP 128*  ? ?  ? ?Pertinent Imaging: ?No results found. ? ?ASSESSMENT/PLAN:  ?Assessment: ?Principal Problem: ?  Hypercalcemia ?Active Problems: ?  Hypokalemia ?  Solitary pulmonary nodule ?  Emphysema lung ([place]) ?  Hand laceration ?  Essential hypertension ?  Alcohol withdrawal ([place]) ?  AKI (acute kidney injury) ([place]) ? ? ?Luis Marquez is a 61 y.o. with a pertinent PMH of hypertension, emphysema, and prior tobacco use, who presented with fall and admitted for severe symptomatic high  hypercalcemia, now in delirium tremens.  ? ?Plan: ?Delirium tremens ?CIWA scores consistently elevated in the teens.  He has received 10 mg of Ativan in the last 24 hours.  Last patient drink on 4/17.  On exam, tremulous, and does not respond to voice.  He has a history of delirium tremens, last in 2011.  He has never been in the ICU for this.  He is about 48 hours from last presumed drink, he will benefit from scheduled Ativan today.  He was unable to take p.o. Librium last night.  Systemic symptoms of DT include elevated temperature and heart rates.   ?-Scheduled Ativan ?-Tylenol as needed ?-Unable to take Librium due to symptoms of DT ?-NS 125 mL/ continuous ? ?Atrial fibrillation with RVR ?Heart rates elevated overnight and EKG showed afib with RVR. Remains on diltiazem infusion. CHAD2VASC score of 1 with hypertension. This is 2/2 to delirium tremens, will hold off on anticoagulation for now as risk is low. Echo is pending to evaluate for cardiomyopathy with his history of alcohol use. ?-continue diltiazem infusion ?-Echocardiogram ? ?Symptomatic hypercalcemia- improving ?Hypokalemia- resolved ?Hypochloridemia ?Elevated bicarb ?Chloride depleted metabolic alkalosis ?Corrected calcium down from 13.6 to 12.6.  Potassium at 3.7.  Phosphorus wnl.  Hypercalcemia is likely contributing to other electrolyte derangements.  He was given 1 dose of zoledronic acid on 4/18 and two doses of calcitonin. We will continue IV fluids for today. Unclear cause of hypercalcemia.  PTH was low at 8.  Differentials include paraneoplastic syndrome due to cancer, granuloma, multiple myeloma, or lytic bone disease. TSH wnl.  ?CT chest showed round glass and solid nodular densities in the right upper and lower lobe. PSA was wnl.  He will need further evaluation as outpatient with PET scan for malignancy. ?-potassium supplementation ?-PTHrP pending ?-Calcitriol pending ?-SPEP, IFE, light  chains pending ?-PET scan as outpatient for the  pulmonary nodule ?-Daily BMP ?-Normal saline 125 cc/h for continuous ?  ?Acute kidney injury ?Creatinine from 1.62->2. This is due to insensible losses with DT. ?-Daily BMP/ CMP ?-Continue with IV hydration ?  ?Elevated transaminases ?LFTs are improving. T bili remains elevated with indirect being 1.4. ?-CMP daily ? ? ?Best Practice: ?Diet: Regular diet ?IVF: Fluids: 0.9NS, Rate:  100 cc/hr x continuous hrs ?VTE: enoxaparin (LOVENOX) injection 40 mg Start: 11/02/21 2000 ?Code: Full ?AB: none ?Therapy Recs: holding off on PT until withdrawal symptoms improve ?DISPO: Anticipated discharge in 2-3 days  pending resolution of electrolyte derangements and alcohol withdrawal. ? ?Signature: ?Daleen Bo. Mykala Mccready, D.O.  ?Internal Medicine Resident, PGY-1 ?[facility]  ?Pager: [phone] ?6:39 AM, 11/04/2021  ? ?Please contact the on call pager after 5 pm and on weekends at [phone].  ?

## 2021-11-04 NOTE — Hospital Course (Addendum)
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 Etiology could be alcohol, chronic hypertension, or ischemic. Cardiology team consulted and their concern for Takotsubo. ?Cardiology and heart failure team consulted.  There was concern for Takotsubo initially.  Right and left heart cath was completed that showed high output heart failure with normal filling pressures. Cardiac MRI was completed and showed EF improved on 4/28 MRI from 25% to 42% . Nonischemic cardiomyopathy possibly related to alcohol use and hypertension.  ?Started on GDMT including: ?- Entresto 97-103 BID ?- Metoprolol succinate 12.5mg  QD ?- Spironolactone 25mg  QD ?- PO furosemide 20mg  QD ?- Per cards, r/p Echo in 2-68mo and can consider SGLT2i in the future  ?Euvolemic on exam at time of discharge. He is discharged with a scale and 20 mg lasix and KCL prn for weight gain. Cardiology recommends repeating echo in 2 to 3 months. He is to follow up with HF team as an OP. ? ?Symptomatic severe electrolyte derangements ?Chloride depleted metabolic alkalosis ?Multiple electrolyte abnormalities on admission, corrected calcium of 15.6 mg/dL, similar to mmol/ L and magnesium under 1 mg/dL.  He was given calcitonin and bisphosphonate.  Hypercalcemia work-up showed low PTH at 8.  Thyroid disease, granuloma disease ruled out.  Differentials include paraneoplastic syndrome versus lytic bone disease. PSA low.  SPEP reflective of hypoalbuminemia. Patient with low-dose CT screening in 2020 that showed multiple small pulmonary nodules throughout both lungs largest in the posterior aspect of the left lower lobe that was 5.8 mm.  This was classified as lung RADS 2 with recommended annual screening however it does not appears that patient has had follow-up imaging.  He will need further evaluation as outpatient  with PET scan. ? ?Atrial fibrillation with RVR ?EKG showed atrial fibrillation with RVR.  Patient was started initially on diltiazem and echocardiogram ordered.  Echo showed reduced EF of 20 to 25% with apical hypokinesis.  Maintaining SR with p.o. amiodarone. Discharged on Amiodarone 200 mg daily.  ? ?Acute kidney injury ?Creatinine initially elevated at 1.69. Baseline at 0.7. Creatinine returned to 0.9 with fluid resuscitation.  ? ?Right hand laceration ?Patient with right hand laceration after fall.  Laceration is located over the second MCP with proximal based flap. This was a gaping wound that was sutured by the emergency department staff.   Is normal sensation over the right hand and digits.  No abnormalities with movement.  This will need to be followed carefully as he does use his hands during his job as a 0[employer].Hand x-ray negative for acute fracture. 10 sutures placed 04/19 and removed after 10 days. ? ?Delirium tremens ?Alcohol use disorder ?Patient with prior history of DT and multiple stays in rehabilitation. On hospital day 2 he went into alcohol withdraw and this was managed with Ativan and Librium. He has worked with [facility] in the past communicated with them about what happened during hospital stay. He is interested in taking naltrexone to help control craving. Prescribed at time of discharge. ? ?COPD ?Home medications include BREO and albuterol. No prior PFTs on file. Patient would likely benefit addition of LAMA inhaler at time of follow up.  ? ? ?1.  Follow-up: ? a. Hypercalcemia- needs PET scan for workup of malignancy ?  ? b. Alcohol withdraw- would benefit from naltrexone  ? ? c. Heart failure- GDMT started, he needs follow-up with cardiology ? ?  D. Afib with RVR- started on amiodarone and apixaban ? ?2.  Labs / imaging needed at time of follow-up: PET scan, repeat echo in 3 months ? ?4.  Medication Changes ? Started: Entresto , Metoprolol succinate , Spironolactone ,  Amiodarone , Eliquis ? Stopped: ?  ? ?Follow-up Appointments: ? ?Cardiology ?Primary Care Provider ?

## 2021-11-05 DIAGNOSIS — I5021 Acute systolic (congestive) heart failure: Secondary | ICD-10-CM

## 2021-11-05 LAB — IMMUNOFIXATION ELECTROPHORESIS
IgA: 377 mg/dL (ref 61–437)
IgG (Immunoglobin G), Serum: 948 mg/dL (ref 603–1613)
IgM (Immunoglobulin M), Srm: 73 mg/dL (ref 20–172)
Total Protein ELP: 5.2 g/dL — ABNORMAL LOW (ref 6.0–8.5)

## 2021-11-05 LAB — CBC
HCT: 33.8 % — ABNORMAL LOW (ref 39.0–52.0)
Hemoglobin: 11.5 g/dL — ABNORMAL LOW (ref 13.0–17.0)
MCH: 34.2 pg — ABNORMAL HIGH (ref 26.0–34.0)
MCHC: 34 g/dL (ref 30.0–36.0)
MCV: 100.6 fL — ABNORMAL HIGH (ref 80.0–100.0)
Platelets: 131 10*3/uL — ABNORMAL LOW (ref 150–400)
RBC: 3.36 MIL/uL — ABNORMAL LOW (ref 4.22–5.81)
RDW: 13.4 % (ref 11.5–15.5)
WBC: 9.4 10*3/uL (ref 4.0–10.5)
nRBC: 0 % (ref 0.0–0.2)

## 2021-11-05 LAB — GLUCOSE, CAPILLARY
Glucose-Capillary: 86 mg/dL (ref 70–99)
Glucose-Capillary: 96 mg/dL (ref 70–99)
Glucose-Capillary: 100 mg/dL — ABNORMAL HIGH (ref 70–99)

## 2021-11-05 LAB — COMPREHENSIVE METABOLIC PANEL
ALT: 45 U/L — ABNORMAL HIGH (ref 0–44)
AST: 58 U/L — ABNORMAL HIGH (ref 15–41)
Albumin: 2 g/dL — ABNORMAL LOW (ref 3.5–5.0)
Alkaline Phosphatase: 75 U/L (ref 38–126)
Anion gap: 7 (ref 5–15)
BUN: 40 mg/dL — ABNORMAL HIGH (ref 8–23)
CO2: 37 mmol/L — ABNORMAL HIGH (ref 22–32)
Calcium: 9.5 mg/dL (ref 8.9–10.3)
Chloride: 98 mmol/L (ref 98–111)
Creatinine, Ser: 1.98 mg/dL — ABNORMAL HIGH (ref 0.61–1.24)
GFR, Estimated: 37 mL/min — ABNORMAL LOW (ref >60)
Glucose, Bld: 91 mg/dL (ref 70–99)
Potassium: 3.3 mmol/L — ABNORMAL LOW (ref 3.5–5.1)
Sodium: 142 mmol/L (ref 135–145)
Total Bilirubin: 0.9 mg/dL (ref 0.3–1.2)
Total Protein: 4.7 g/dL — ABNORMAL LOW (ref 6.5–8.1)

## 2021-11-05 LAB — PROTEIN ELECTROPHORESIS, SERUM
A/G Ratio: 1 (ref 0.7–1.7)
Albumin ELP: 2.6 g/dL — ABNORMAL LOW (ref 2.9–4.4)
Alpha-1-Globulin: 0.3 g/dL (ref 0.0–0.4)
Alpha-2-Globulin: 0.6 g/dL (ref 0.4–1.0)
Beta Globulin: 0.9 g/dL (ref 0.7–1.3)
Gamma Globulin: 0.8 g/dL (ref 0.4–1.8)
Globulin, Total: 2.7 g/dL (ref 2.2–3.9)
Total Protein ELP: 5.3 g/dL — ABNORMAL LOW (ref 6.0–8.5)

## 2021-11-05 LAB — PHOSPHORUS: Phosphorus: 3.2 mg/dL (ref 2.5–4.6)

## 2021-11-05 LAB — PTH, INTACT AND CALCIUM
Calcium, Total (PTH): 13.7 mg/dL (ref 8.6–10.2)
PTH: 8 pg/mL — ABNORMAL LOW (ref 15–65)

## 2021-11-05 LAB — MAGNESIUM: Magnesium: 1.6 mg/dL — ABNORMAL LOW (ref 1.7–2.4)

## 2021-11-05 MED ORDER — MAGNESIUM SULFATE 2 GM/50ML IV SOLN
2.0000 g | Freq: Once | INTRAVENOUS | Status: AC
  Administered 2021-11-05: 2 g via INTRAVENOUS
  Filled 2021-11-05: qty 50

## 2021-11-05 MED ORDER — IPRATROPIUM-ALBUTEROL 0.5-2.5 (3) MG/3ML IN SOLN
3.0000 mL | Freq: Four times a day (QID) | RESPIRATORY_TRACT | Status: AC
  Administered 2021-11-05 (×2): 3 mL via RESPIRATORY_TRACT
  Filled 2021-11-05 (×2): qty 3

## 2021-11-05 MED ORDER — POTASSIUM CHLORIDE 10 MEQ/100ML IV SOLN
10.0000 meq | INTRAVENOUS | Status: AC
  Administered 2021-11-05 (×4): 10 meq via INTRAVENOUS
  Filled 2021-11-05 (×4): qty 100

## 2021-11-05 MED ORDER — POTASSIUM CHLORIDE 10 MEQ/100ML IV SOLN
10.0000 meq | INTRAVENOUS | Status: AC
  Administered 2021-11-05 (×3): 10 meq via INTRAVENOUS
  Filled 2021-11-05 (×3): qty 100

## 2021-11-05 MED ORDER — DEXTROSE 5 % IV SOLN: INTRAVENOUS | Status: DC

## 2021-11-05 MED ORDER — DEXTROSE-NACL 5-0.45 % IV SOLN
INTRAVENOUS | Status: AC
Start: 2021-11-05 — End: 2021-11-06

## 2021-11-05 MED ORDER — ENOXAPARIN SODIUM 100 MG/ML IJ SOSY
100.0000 mg | PREFILLED_SYRINGE | Freq: Two times a day (BID) | INTRAMUSCULAR | Status: AC
  Administered 2021-11-05 – 2021-11-10 (×11): 100 mg via SUBCUTANEOUS
  Filled 2021-11-05 (×10): qty 1

## 2021-11-05 MED ORDER — IPRATROPIUM-ALBUTEROL 0.5-2.5 (3) MG/3ML IN SOLN
3.0000 mL | Freq: Four times a day (QID) | RESPIRATORY_TRACT | Status: DC | PRN
  Administered 2021-11-05: 3 mL via RESPIRATORY_TRACT
  Filled 2021-11-05: qty 3

## 2021-11-05 NOTE — Progress Notes (Signed)
?   11/05/21 0300  ?Assess: MEWS Score  ?Temp 98.4 ?F (36.9 ?C)  ?BP 120/78  ?Pulse Rate (!) 105  ?ECG Heart Rate (!) 103  ?Resp (!) 28  ?SpO2 95 %  ?O2 Device Nasal Cannula  ?O2 Flow Rate (L/min) 4 L/min  ?Assess: MEWS Score  ?MEWS Temp 0  ?MEWS Systolic 0  ?MEWS Pulse 1  ?MEWS RR 2  ?MEWS LOC 1  ?MEWS Score 4  ?MEWS Score Color Red  ?Assess: if the MEWS score is Yellow or Red  ?Were vital signs taken at a resting state? Yes  ?Focused Assessment No change from prior assessment  ?Early Detection of Sepsis Score *See Row Information* Medium  ?MEWS guidelines implemented *See Row Information* No, previously red, continue vital signs every 4 hours  ?Treat  ?MEWS Interventions Administered prn meds/treatments  ?Pain Scale Faces  ?Take Vital Signs  ?Increase Vital Sign Frequency  Red: Q 1hr X 4 then Q 4hr X 4, if remains red, continue Q 4hrs  ?Escalate  ?MEWS: Escalate Red: discuss with charge nurse/RN and provider, consider discussing with RRT  ?Notify: Charge Nurse/RN  ?Name of Charge Nurse/RN Notified Darl Pikes, RN  ?Date Charge Nurse/RN Notified 11/05/21  ?Time Charge Nurse/RN Notified 0315  ?Document  ?Patient Outcome Other (Comment) ?(remains on floor going through withdrawls)  ? ? ?

## 2021-11-05 NOTE — Progress Notes (Signed)
PT Cancellation Note ? ?Patient Details ?Name: Luis Marquez ?MRN: [id number] ?DOB: [birth year] ? ? ?Cancelled Treatment:    Reason Eval/Treat Not Completed: Patient not medically ready. Patient is not appropriate at this time for PT, per RN has been given ativan and when awake unable to follow directions at all. Will continue to monitor and see when appropriate.  ? ? ?Luis Marquez ?11/05/2021, 1:15 PM ?

## 2021-11-05 NOTE — Progress Notes (Signed)
?   11/04/21 1930  ?Assess: MEWS Score  ?Temp 98.6 ?F (37 ?C)  ?BP 103/78  ?Pulse Rate 90  ?ECG Heart Rate 100  ?Resp (!) 33  ?Level of Consciousness Responds to Voice  ?SpO2 95 %  ?O2 Device Nasal Cannula  ?Patient Activity (if Appropriate) In bed  ?O2 Flow Rate (L/min) 4 L/min  ?Assess: MEWS Score  ?MEWS Temp 0  ?MEWS Systolic 0  ?MEWS Pulse 0  ?MEWS RR 2  ?MEWS LOC 1  ?MEWS Score 3  ?MEWS Score Color Yellow  ?Assess: if the MEWS score is Yellow or Red  ?Were vital signs taken at a resting state? Yes  ?Focused Assessment No change from prior assessment ?(in and out red/yellow mews going through withdrawls)  ?Early Detection of Sepsis Score *See Row Information* Medium  ?MEWS guidelines implemented *See Row Information* No, previously red, continue vital signs every 4 hours  ? ? ?

## 2021-11-05 NOTE — Progress Notes (Addendum)
? ?HD#3 ?SUBJECTIVE:  ?Patient Summary: Luis Marquez is a 61 y.o. with a pertinent PMH of hypertension, emphysema, and prior tobacco use, who presented with fall and admitted for severe symptomatic hypercalcemia, now in delirium tremens.  ? ?Overnight Events: Remained on Amiodarone drip. Ciwa scores remain elevated with scheduled ativan.  ? ?Interim History: Patient assessed at bedside this AM.  He is unable to answer questions.  He is tremulous on exam with accessory muscle use.  ? ?OBJECTIVE:  ?Vital Signs: ?Vitals:  ? 11/05/21 1000 11/05/21 1057 11/05/21 1100 11/05/21 1200  ?BP: 131/82  131/75 130/80  ?Pulse: (!) 132  (!) 109 (!) 107  ?Resp: (!) 40  (!) 30 20  ?Temp:    (!) 97.4 ?F (36.3 ?C)  ?TempSrc:    Axillary  ?SpO2: 92% 92% 92% 94%  ?Weight:      ?Height:      ? ?Supplemental O2: Nasal Cannula ?SpO2: 94 % ?O2 Flow Rate (L/min): 4 L/min ? ?Filed Weights  ? 11/02/21 2141 11/03/21 0800  ?Weight: 95.3 kg 95.3 kg  ? ? ? ?Intake/Output Summary (Last 24 hours) at 11/05/2021 1224 ?Last data filed at 11/05/2021 5277 ?Gross per 24 hour  ?Intake 1802.18 ml  ?Output 1400 ml  ?Net 402.18 ml  ? ?Net IO Since Admission: -646.23 mL [11/05/21 1224] ? ?Physical Exam: ?Physical Exam ?Constitutional:   ?   Appearance: He is ill-appearing and diaphoretic.  ?   Comments: sleepy  ?HENT:  ?   Head: Normocephalic.  ?   Comments: Abrasion present on right temple, sweating ?   Mouth/Throat:  ?   Mouth: Mucous membranes are moist.  ?Cardiovascular:  ?   Rate and Rhythm: Tachycardia present. Rhythm irregular.  ?   Heart sounds: Normal heart sounds. No murmur heard. ?Pulmonary:  ?   Breath sounds: Wheezing present.  ?   Comments: Supplemental oxygen requirement increased from 2L to 4L, accessory muscle use ?Abdominal:  ?   General: Abdomen is flat.  ?   Palpations: Abdomen is soft.  ?Musculoskeletal:  ?   Comments: Bandage in place over right hand, tremulous  ?Skin: ?   General: Skin is warm.  ?Neurological:  ?   Mental Status: He is  disoriented.  ?   Comments: Does not respond to name, but responds to sternal rub and opens eyes  ?   ? ?Patient Lines/Drains/Airways Status   ? ? Active Line/Drains/Airways   ? ? Name Placement date Placement time Site Days  ? Peripheral IV 11/02/21 20 G Right Antecubital 11/02/21  1511  Antecubital  1  ? External Urinary Catheter 11/03/21  0340  --  less than 1  ? Incision (Closed) 11/11/14 Hip Right 11/11/14  2212  -- 2549  ? ?  ?  ? ?  ? ? ?Pertinent Labs: ? ?  Latest Ref Rng & Units 11/05/2021  ?  1:26 AM 11/04/2021  ?  1:19 AM 11/02/2021  ?  1:13 PM  ?CBC  ?WBC 4.0 - 10.5 K/uL 9.4   7.6   9.4    ?Hemoglobin 13.0 - 17.0 g/dL 11.5   12.5   13.4    ?Hematocrit 39.0 - 52.0 % 33.8   35.8   37.3    ?Platelets 150 - 400 K/uL 131   138   155    ? ? ? ?  Latest Ref Rng & Units 11/05/2021  ?  1:26 AM 11/04/2021  ?  1:19 AM 11/03/2021  ? 10:52 AM  ?  CMP  ?Glucose 70 - 99 mg/dL 91   95   92    ?BUN 8 - 23 mg/dL 40   30   19    ?Creatinine 0.61 - 1.24 mg/dL 1.98   2.04   1.62    ?Sodium 135 - 145 mmol/L 142   137   138    ?Potassium 3.5 - 5.1 mmol/L 3.3   3.7   3.5    ?Chloride 98 - 111 mmol/L 98   89   85    ?CO2 22 - 32 mmol/L 37   36   44    ?Calcium 8.9 - 10.3 mg/dL 9.5   11.2   12.2    ?Total Protein 6.5 - 8.1 g/dL 4.7   5.1     ?Total Bilirubin 0.3 - 1.2 mg/dL 0.9   1.8    ? 1.8     ?Alkaline Phos 38 - 126 U/L 75   89     ?AST 15 - 41 U/L 58   139     ?ALT 0 - 44 U/L 45   63     ? ? ?Recent Labs  ?  11/05/21 ?7353 11/05/21 ?1150  ?GLUCAP 86 96  ?  ? ?Pertinent Imaging: ?No results found. ? ?ASSESSMENT/PLAN:  ?Assessment: ?Principal Problem: ?  Hypercalcemia ?Active Problems: ?  Delirium tremens ([place]) ?  Hypokalemia ?  Solitary pulmonary nodule ?  Hand laceration ?  Essential hypertension ?  Alcohol withdrawal ([place]) ?  AKI (acute kidney injury) ([place]) ?  Atrial fibrillation with RVR ([place]) ?  Acute HFrEF (heart failure with reduced ejection fraction) ([place]) ? ? ?Luis Marquez is a 61 y.o. with a pertinent PMH of hypertension,  emphysema, and prior tobacco use, who presented with fall and admitted for severe symptomatic high hypercalcemia, now in delirium tremens.  ? ?Plan: ?Delirium tremens ?Metabolic encephalopathy ?CIWA scores remain elevated. He has received 18 mg of Ativan in the last 24 hours.  Last patient drink on 4/17.  On exam, tremulous, and does not respond to voice.  He has a history of delirium tremens, last in 2011.  He has never been in the ICU for this.  He is about 72 hours from last presumed drink, he will benefit from continued scheduled Ativan today.  His brother reported that patient lives at [place] and drinks about 8-10 mikes hard lemonades daily. ?-Continue scheduled and prn Ativan ?-D5/ 1/2NS 75 mL/ 12 hrs ? ?Atrial fibrillation with RVR ?Heart rates in 100-120s. Likely being driven by acute illness/alcohol withdrawal. CHAD2VASC score of 2 with heart failure and hypertension. Could benefit from anticoagulation for primary stroke prophylaxis for at least a few months. ?-continue amiodarone infusion for RVR ?-Lovenox at anticoagulation dose, change to DOAC when able to take PO.  ? ?Acute heart failure with reduced ejection fraction ?Echo showed EF of 20-25% with regional wall motion abnormalities. Etiology could be alcohol, chronic hypertension, or ischemic. Will start goal directed medical therapy when he stabilizes and can take orals. RAAS inhibition may be limited by decreased GFR. Still appears hypovolemic today, no need for diuresis at this time. ?-ischemic evaluation with cardiology once more stable ?-check lipids and A1c ? ?Symptomatic hypercalcemia- improving ?Hypokalemia- resolved ?Hypochloridemia ?Elevated bicarb ?Chloride depleted metabolic alkalosis ?Corrected calcium at 9.5.  Potassium at 3.3.  Phosphorus wnl.  He was given 1 dose of zoledronic acid on 4/18 and two doses of calcitonin. Unclear cause of hypercalcemia.  PTH was low at 8.  Thyroid disease, granuloma disease have been ruled out.  Differentials include paraneoplastic syndrome due to cancer vs lytic bone disease vs multiple myeloma (lights chain ratio wnl).  He will need further evaluation as outpatient with PET scan for malignancy. ?-potassium supplementation ?-PTHrP pending ?-SPEP and IFE pending ?-PET scan as outpatient for the pulmonary nodule ?-Daily BMP ?  ?Acute kidney injury ?Creatinine about the same at 2. This is due to insensible losses with DT. ?-Daily BMP/ CMP ?-Continue fluids with D51/2NS 75 mL/ 12 hours ?  ?COPD ?Home medications include Breo inhaler. He is not at the point he can tolerate inhaler. Wheezing appreciated on exam and he has an increasing oxygen requirement. ?-duonebs PRN ? ?Elevated transaminases ?LFTs are improving. ?-CMP daily ? ? ?Best Practice: ?Diet: NPO ?IVF: Fluids: D5 1/2NS, Rate:  75 cc/hr x 12 hrs ?VTE: Lovenox per pharmacy for afib ?Code: Full ?AB: none ?Therapy Recs: holding off on PT until withdrawal symptoms improve ?Family: updated brother who lives in [place], he states that he will visit patient on 4/23 ?DISPO: Anticipated discharge in 2-3 days  pending resolution of electrolyte derangements and alcohol withdrawal. ? ?Signature: ?Daleen Bo. Yaret Hush, D.O.  ?Internal Medicine Resident, PGY-1 ?[facility]  ?Pager: [phone] ?12:24 PM, 11/05/2021  ? ?Please contact the on call pager after 5 pm and on weekends at [phone].  ?

## 2021-11-05 NOTE — Progress Notes (Signed)
ANTICOAGULATION CONSULT NOTE ? ?Pharmacy Consult for Enoxaparin ?Indication: atrial fibrillation ? ?No Known Allergies ? ?Patient Measurements: ?Height: 6\' 1"  (185.4 cm) ?Weight: 95.3 kg (210 lb 1.6 oz) ?IBW/kg (Calculated) : 79.9 ? ?Vital Signs: ?Temp: 97.4 ?F (36.3 ?C) (04/21 1200) ?Temp Source: Axillary (04/21 1200) ?BP: 143/90 (04/21 1400) ?Pulse Rate: 120 (04/21 1400) ? ?Labs: ?Recent Labs  ?  11/03/21 ?1052 11/04/21 ?0119 11/05/21 ?0126  ?HGB  --  12.5* 11.5*  ?HCT  --  35.8* 33.8*  ?PLT  --  138* 131*  ?CREATININE 1.62* 2.04* 1.98*  ? ? ?Estimated Creatinine Clearance: 43.2 mL/min (A) (by C-G formula based on SCr of 1.98 mg/dL (H)). ? ? ?Assessment: ?61 y.o. with a medical history significant for hypertension, emphysema, and prior tobacco use, who presented with fall and admitted for severe symptomatic hypercalcemia now found to be in atrial fibrillation. Pharmacy consulted to manage enoxaparin. ? ?Goal of Therapy:  ?Monitor platelets by anticoagulation protocol: Yes ?  ?Plan:  ?Enoxaparin 100mg  SQ twice daily ?F/u transition to [place] when able to take PO ?Continue to monitor H&H and platelets ? ? ? ?Thank you for allowing pharmacy to be a part of this patient?s care. ? ?Ardyth Harps, PharmD ?Clinical Pharmacist ? ? ?

## 2021-11-05 NOTE — Progress Notes (Signed)
Got in contact with patient brother and updated him,  Son reached out for update as well.  ?

## 2021-11-06 LAB — MAGNESIUM: Magnesium: 1.5 mg/dL — ABNORMAL LOW (ref 1.7–2.4)

## 2021-11-06 LAB — COMPREHENSIVE METABOLIC PANEL
ALT: 44 U/L (ref 0–44)
AST: 58 U/L — ABNORMAL HIGH (ref 15–41)
Albumin: 2 g/dL — ABNORMAL LOW (ref 3.5–5.0)
Alkaline Phosphatase: 79 U/L (ref 38–126)
Anion gap: 10 (ref 5–15)
BUN: 37 mg/dL — ABNORMAL HIGH (ref 8–23)
CO2: 34 mmol/L — ABNORMAL HIGH (ref 22–32)
Calcium: 8.7 mg/dL — ABNORMAL LOW (ref 8.9–10.3)
Chloride: 99 mmol/L (ref 98–111)
Creatinine, Ser: 1.43 mg/dL — ABNORMAL HIGH (ref 0.61–1.24)
GFR, Estimated: 55 mL/min — ABNORMAL LOW (ref >60)
Glucose, Bld: 105 mg/dL — ABNORMAL HIGH (ref 70–99)
Potassium: 3.4 mmol/L — ABNORMAL LOW (ref 3.5–5.1)
Sodium: 143 mmol/L (ref 135–145)
Total Bilirubin: 1 mg/dL (ref 0.3–1.2)
Total Protein: 5 g/dL — ABNORMAL LOW (ref 6.5–8.1)

## 2021-11-06 LAB — HEMOGLOBIN A1C
Hgb A1c MFr Bld: 5.2 % (ref 4.8–5.6)
Mean Plasma Glucose: 102.54 mg/dL

## 2021-11-06 LAB — CBC
HCT: 33.9 % — ABNORMAL LOW (ref 39.0–52.0)
Hemoglobin: 12.1 g/dL — ABNORMAL LOW (ref 13.0–17.0)
MCH: 35.5 pg — ABNORMAL HIGH (ref 26.0–34.0)
MCHC: 35.7 g/dL (ref 30.0–36.0)
MCV: 99.4 fL (ref 80.0–100.0)
Platelets: 152 10*3/uL (ref 150–400)
RBC: 3.41 MIL/uL — ABNORMAL LOW (ref 4.22–5.81)
RDW: 13.6 % (ref 11.5–15.5)
WBC: 8.8 10*3/uL (ref 4.0–10.5)
nRBC: 0 % (ref 0.0–0.2)

## 2021-11-06 LAB — PHOSPHORUS: Phosphorus: 2.3 mg/dL — ABNORMAL LOW (ref 2.5–4.6)

## 2021-11-06 LAB — LIPID PANEL
Cholesterol: 120 mg/dL (ref 0–200)
HDL: 39 mg/dL — ABNORMAL LOW (ref >40)
LDL Cholesterol: 60 mg/dL (ref 0–99)
Total CHOL/HDL Ratio: 3.1 RATIO
Triglycerides: 107 mg/dL (ref <150)
VLDL: 21 mg/dL (ref 0–40)

## 2021-11-06 MED ORDER — DEXTROSE-NACL 5-0.45 % IV SOLN: INTRAVENOUS | Status: AC

## 2021-11-06 MED ORDER — IPRATROPIUM-ALBUTEROL 0.5-2.5 (3) MG/3ML IN SOLN: 3.0000 mL | Freq: Four times a day (QID) | RESPIRATORY_TRACT | Status: DC | PRN

## 2021-11-06 MED ORDER — IPRATROPIUM-ALBUTEROL 0.5-2.5 (3) MG/3ML IN SOLN: 3.0000 mL | Freq: Once | RESPIRATORY_TRACT | Status: AC

## 2021-11-06 MED ORDER — LORAZEPAM 1 MG PO TABS: 1.0000 mg | ORAL_TABLET | ORAL | Status: DC | PRN

## 2021-11-06 MED ORDER — IPRATROPIUM-ALBUTEROL 0.5-2.5 (3) MG/3ML IN SOLN
3.0000 mL | Freq: Four times a day (QID) | RESPIRATORY_TRACT | Status: DC
  Administered 2021-11-06: 3 mL via RESPIRATORY_TRACT
  Filled 2021-11-06: qty 3

## 2021-11-06 MED ORDER — IPRATROPIUM-ALBUTEROL 0.5-2.5 (3) MG/3ML IN SOLN
RESPIRATORY_TRACT | Status: AC
  Administered 2021-11-06: 3 mL
  Filled 2021-11-06: qty 3

## 2021-11-06 MED ORDER — LORAZEPAM 2 MG/ML IJ SOLN
1.0000 mg | INTRAMUSCULAR | Status: DC | PRN
  Administered 2021-11-06 – 2021-11-07 (×5): 2 mg via INTRAVENOUS
  Filled 2021-11-06 (×5): qty 1

## 2021-11-06 MED ORDER — POTASSIUM CHLORIDE 10 MEQ/100ML IV SOLN
10.0000 meq | INTRAVENOUS | Status: AC
  Administered 2021-11-06 (×5): 10 meq via INTRAVENOUS
  Filled 2021-11-06 (×5): qty 100

## 2021-11-06 MED ORDER — SODIUM PHOSPHATES 45 MMOLE/15ML IV SOLN
15.0000 mmol | Freq: Once | INTRAVENOUS | Status: AC
  Administered 2021-11-06: 15 mmol via INTRAVENOUS
  Filled 2021-11-06: qty 5

## 2021-11-06 MED ORDER — MAGNESIUM SULFATE 2 GM/50ML IV SOLN
2.0000 g | Freq: Once | INTRAVENOUS | Status: AC
  Administered 2021-11-06: 2 g via INTRAVENOUS
  Filled 2021-11-06: qty 50

## 2021-11-06 NOTE — Progress Notes (Signed)
? ?HD#4 ?SUBJECTIVE:  ?Patient Summary: Luis Marquez is a 61 y.o. with a pertinent PMH of hypertension, emphysema, and prior tobacco use, who presented with fall and admitted for severe symptomatic hypercalcemia, now in delirium tremens.  ? ?Overnight Events: Remained on Amiodarone drip. Ciwa scores remain elevated with scheduled ativan.  ? ?Interim History: Patient assessed at bedside this AM.  He is not able to answer questions today. Only responsive to sternal rub. He is not tremulous, diaphoretic, or agitated. ? ?OBJECTIVE:  ?Vital Signs: ?Vitals:  ? 11/06/21 0200 11/06/21 0300 11/06/21 0400 11/06/21 0500  ?BP: (!) 131/100 (!) 139/100 (!) 138/107 (!) 146/89  ?Pulse: (!) 118 (!) 123 (!) 133 (!) 123  ?Resp: (!) 43 (!) 34 (!) 38 (!) 36  ?Temp:  98.2 ?F (36.8 ?C)    ?TempSrc:  Axillary    ?SpO2: 91% 90% (!) 88% 90%  ?Weight:    95 kg  ?Height:      ? ?Supplemental O2: Nasal Cannula ?SpO2: 90 % ?O2 Flow Rate (L/min): 4 L/min ? ?Filed Weights  ? 11/02/21 2141 11/03/21 0800 11/06/21 0500  ?Weight: 95.3 kg 95.3 kg 95 kg  ? ? ? ?Intake/Output Summary (Last 24 hours) at 11/06/2021 0611 ?Last data filed at 11/06/2021 0445 ?Gross per 24 hour  ?Intake 1832.79 ml  ?Output 3300 ml  ?Net -1467.21 ml  ? ? ?Net IO Since Admission: -1,671.46 mL [11/06/21 0611] ? ?Physical Exam: ?Physical Exam ?Constitutional:   ?   Comments: somnolent  ?HENT:  ?   Head: Normocephalic.  ?   Comments: Abrasion present on right temple, sweating ?   Mouth/Throat:  ?   Mouth: Mucous membranes are moist.  ?Cardiovascular:  ?   Rate and Rhythm: Tachycardia present. Rhythm irregular.  ?   Heart sounds: No murmur heard. ?Abdominal:  ?   General: Abdomen is flat.  ?   Palpations: Abdomen is soft.  ?Musculoskeletal:  ?   Comments: Bandage in place over right hand, tremulous  ?Skin: ?   General: Skin is warm and dry.  ?Neurological:  ?   Mental Status: He is disoriented.  ?   Comments: Does not respond to name, but responds to sternal rub and opens eyes  ?    ? ?Patient Lines/Drains/Airways Status   ? ? Active Line/Drains/Airways   ? ? Name Placement date Placement time Site Days  ? Peripheral IV 11/02/21 20 G Right Antecubital 11/02/21  1511  Antecubital  1  ? External Urinary Catheter 11/03/21  0340  --  less than 1  ? Incision (Closed) 11/11/14 Hip Right 11/11/14  2212  -- 2549  ? ?  ?  ? ?  ? ? ?Pertinent Labs: ? ?  Latest Ref Rng & Units 11/06/2021  ?  2:41 AM 11/05/2021  ?  1:26 AM 11/04/2021  ?  1:19 AM  ?CBC  ?WBC 4.0 - 10.5 K/uL 8.8   9.4   7.6    ?Hemoglobin 13.0 - 17.0 g/dL 12.1   11.5   12.5    ?Hematocrit 39.0 - 52.0 % 33.9   33.8   35.8    ?Platelets 150 - 400 K/uL 152   131   138    ? ? ? ?  Latest Ref Rng & Units 11/06/2021  ?  2:41 AM 11/05/2021  ?  1:26 AM 11/04/2021  ?  1:19 AM  ?CMP  ?Glucose 70 - 99 mg/dL 105   91   95    ?BUN 8 -  23 mg/dL 37   40   30    ?Creatinine 0.61 - 1.24 mg/dL 1.43   1.98   2.04    ?Sodium 135 - 145 mmol/L 143   142   137    ?Potassium 3.5 - 5.1 mmol/L 3.4   3.3   3.7    ?Chloride 98 - 111 mmol/L 99   98   89    ?CO2 22 - 32 mmol/L 34   37   36    ?Calcium 8.9 - 10.3 mg/dL 8.7   9.5   11.2    ?Total Protein 6.5 - 8.1 g/dL 5.0   4.7   5.1    ?Total Bilirubin 0.3 - 1.2 mg/dL 1.0   0.9   1.8    ? 1.8    ?Alkaline Phos 38 - 126 U/L 79   75   89    ?AST 15 - 41 U/L 58   58   139    ?ALT 0 - 44 U/L 44   45   63    ? ? ?Recent Labs  ?  11/05/21 ?7412 11/05/21 ?1150 11/05/21 ?1546  ?GLUCAP 86 96 100*  ? ?  ? ?Pertinent Imaging: ?No results found. ? ?ASSESSMENT/PLAN:  ?Assessment: ?Principal Problem: ?  Hypercalcemia ?Active Problems: ?  Delirium tremens ([place]) ?  Hypokalemia ?  Solitary pulmonary nodule ?  Hand laceration ?  Essential hypertension ?  Alcohol withdrawal ([place]) ?  AKI (acute kidney injury) ([place]) ?  Atrial fibrillation with RVR ([place]) ?  Acute HFrEF (heart failure with reduced ejection fraction) ([place]) ? ? ?Luis Marquez is a 61 y.o. with a pertinent PMH of hypertension, emphysema, and prior tobacco use, who presented with fall  and admitted for severe symptomatic high hypercalcemia, now in delirium tremens.  ? ?Plan: ?Delirium tremens ?Metabolic encephalopathy ?CIWA scores remain elevated. He has received 23 mg of Ativan in the last 24 hours.  Last patient drink on 4/17.  On exam, he is no linger tremulous or agitated, but appears to be sedated 2/2 to medication.  He is about 96 hours from last presumed drink, will discontinue scheduled ativan as he is approaching window that this should be resolving.   ?-CiWA ?-Continue prn Ativan ?-NPO as unsafe to tolerate PO intake ?-D5 1/2 NS 75 mL/ 8 hrs  ? ?Atrial fibrillation with RVR ?Heart rates in 100-120s. Likely being driven by acute illness/alcohol withdrawal. CHAD2VASC score of 2 with heart failure and hypertension. Could benefit from anticoagulation for primary stroke prophylaxis for at least a few months. ?-continue amiodarone infusion for RVR ?-Lovenox at anticoagulation dose, change to DOAC when able to take PO.  ? ?Acute heart failure with reduced ejection fraction ?Echo showed EF of 20-25% with regional wall motion abnormalities. Etiology could be alcohol, chronic hypertension, or ischemic. Will start goal directed medical therapy when he stabilizes and can take orals. RAAS inhibition may be limited by decreased GFR. Still appears hypovolemic today, no need for diuresis at this time. ?-ischemic evaluation with cardiology once more stable ? ?Symptomatic hypercalcemia- resolved ?Hypokalemia ?Hypochloridemia ?Elevated bicarb ?Chloride depleted metabolic alkalosis ?Corrected calcium at 9.5.  Potassium at 3.3.  Phosphorus wnl.  He was given 1 dose of zoledronic acid on 4/18 and two doses of calcitonin. Unclear cause of hypercalcemia.  PTH was low at 8.  Thyroid disease, granuloma disease have been ruled out. Differentials include paraneoplastic syndrome due to cancer vs lytic bone disease vs multiple myeloma (lights chain ratio wnl).  He will need further evaluation as outpatient with PET  scan for malignancy. ?-potassium supplementation ?-PTHrP pending ?-SPEP and IFE pending ?-PET scan as outpatient for the pulmonary nodule ?-Daily BMP ?  ?Acute kidney injury ?Creatinine improved from 2 to 1.43 today. His baseline appears to be <1. He received fluids yesterday due to insensible losses from DT and not being able to safely drink water.  ?-Daily BMP/ CMP ?-Continue fluids with D5 1/2 NS 75 mL/ 8 hours ?  ?COPD ?Home medications include Breo inhaler. He is not at the point he can tolerate inhaler. Wheezing improved from yesterday but still present. ?-duonebs PRN ? ?Elevated transaminases ?LFTs are improving. ?-CMP daily ? ? ?Best Practice: ?Diet: NPO ?IVF: Fluids: D5 1/2NS, Rate:  75 cc/hr x 8 hrs ?VTE: Lovenox per pharmacy for afib ?Code: Full ?AB: none ?Therapy Recs: holding off on PT until withdrawal symptoms improve ?Family: updated brother who lives in [place], he states that he will visit patient on 4/23 ?DISPO: Anticipated discharge in 2-3 days  pending resolution of electrolyte derangements and alcohol withdrawal. ? ?Signature: ?Daleen Bo. Clarita Mcelvain, D.O.  ?Internal Medicine Resident, PGY-1 ?[facility]  ?Pager: [phone] ?6:11 AM, 11/06/2021  ? ?Please contact the on call pager after 5 pm and on weekends at [phone].  ?

## 2021-11-06 NOTE — Significant Event (Incomplete)
Rapid Response Event Note  ? ?Reason for Call :  ?Change in LOC ? ?Initial Focused Assessment:  ?Called by this patient's nurse to come assess the neurological functioning of this patient. The MD's had noted that he was really hard to arouse. I was able to arouse him and he was able to stay awake for 80% of my exam. He was able to faintly identify his name, year, month, and various objects. He has some right sided weakness, especially in his lower right extremety.  ? ? ? ? ?Interventions:  ? ? ?Plan of Care:  ? ? ? ?Event Summary:  ? ?MD Notified:  ?Call Time: ?Arrival Time: ?End Time: ? ?Andrey Spearman, RN ?

## 2021-11-07 LAB — COMPREHENSIVE METABOLIC PANEL
ALT: 38 U/L (ref 0–44)
AST: 50 U/L — ABNORMAL HIGH (ref 15–41)
Albumin: 2 g/dL — ABNORMAL LOW (ref 3.5–5.0)
Alkaline Phosphatase: 79 U/L (ref 38–126)
Anion gap: 10 (ref 5–15)
BUN: 28 mg/dL — ABNORMAL HIGH (ref 8–23)
CO2: 32 mmol/L (ref 22–32)
Calcium: 7.9 mg/dL — ABNORMAL LOW (ref 8.9–10.3)
Chloride: 100 mmol/L (ref 98–111)
Creatinine, Ser: 1.09 mg/dL (ref 0.61–1.24)
GFR, Estimated: >60 mL/min (ref >60)
Glucose, Bld: 103 mg/dL — ABNORMAL HIGH (ref 70–99)
Potassium: 3.2 mmol/L — ABNORMAL LOW (ref 3.5–5.1)
Sodium: 142 mmol/L (ref 135–145)
Total Bilirubin: 0.7 mg/dL (ref 0.3–1.2)
Total Protein: 4.8 g/dL — ABNORMAL LOW (ref 6.5–8.1)

## 2021-11-07 LAB — PHOSPHORUS: Phosphorus: 2.7 mg/dL (ref 2.5–4.6)

## 2021-11-07 LAB — CBC
HCT: 33.4 % — ABNORMAL LOW (ref 39.0–52.0)
Hemoglobin: 11.5 g/dL — ABNORMAL LOW (ref 13.0–17.0)
MCH: 34.7 pg — ABNORMAL HIGH (ref 26.0–34.0)
MCHC: 34.4 g/dL (ref 30.0–36.0)
MCV: 100.9 fL — ABNORMAL HIGH (ref 80.0–100.0)
Platelets: 169 10*3/uL (ref 150–400)
RBC: 3.31 MIL/uL — ABNORMAL LOW (ref 4.22–5.81)
RDW: 13.6 % (ref 11.5–15.5)
WBC: 7.3 10*3/uL (ref 4.0–10.5)
nRBC: 0 % (ref 0.0–0.2)

## 2021-11-07 LAB — MAGNESIUM: Magnesium: 1.5 mg/dL — ABNORMAL LOW (ref 1.7–2.4)

## 2021-11-07 MED ORDER — IPRATROPIUM-ALBUTEROL 0.5-2.5 (3) MG/3ML IN SOLN
3.0000 mL | Freq: Four times a day (QID) | RESPIRATORY_TRACT | Status: DC
  Administered 2021-11-07 (×2): 3 mL via RESPIRATORY_TRACT
  Filled 2021-11-07 (×2): qty 3

## 2021-11-07 MED ORDER — MAGNESIUM SULFATE 2 GM/50ML IV SOLN
2.0000 g | Freq: Once | INTRAVENOUS | Status: AC
  Administered 2021-11-07: 2 g via INTRAVENOUS
  Filled 2021-11-07: qty 50

## 2021-11-07 NOTE — Progress Notes (Signed)
? ?HD#5 ?SUBJECTIVE:  ?Patient Summary: Luis Marquez is a 61 y.o. with a pertinent PMH of hypertension, emphysema, and prior tobacco use, who presented with fall and admitted for severe symptomatic hypercalcemia, now in delirium tremens.  ? ?Overnight Events: Ciwa score down trending. ? ?Interim History: Patient assessed at bedside this AM.  His son and grandson were at bedside.  He remains confused, but appears more awake than yesterday.  We talked with patient's son who lives in [place], that he would likely need more support at home once he improved from this acute illness.  His son states that he lives over 4 hours away.  Patient's brother should also be visiting sometime this afternoon per son. ? ?OBJECTIVE:  ?Vital Signs: ?Vitals:  ? 11/06/21 2000 11/06/21 2207 11/07/21 0000 11/07/21 0400  ?BP: (!) 126/93  125/75 (!) 170/97  ?Pulse: 95  84 90  ?Resp: (!) 32  20 19  ?Temp: 98.6 ?F (37 ?C)  98.7 ?F (37.1 ?C) 98.2 ?F (36.8 ?C)  ?TempSrc: Axillary  Axillary Axillary  ?SpO2: 95% 95% 98% 98%  ?Weight:    95.6 kg  ?Height:      ? ?Supplemental O2: Nasal Cannula ?SpO2: 98 % ?O2 Flow Rate (L/min): 15 L/min ? ?Filed Weights  ? 11/03/21 0800 11/06/21 0500 11/07/21 0400  ?Weight: 95.3 kg 95 kg 95.6 kg  ? ? ? ?Intake/Output Summary (Last 24 hours) at 11/07/2021 1287 ?Last data filed at 11/07/2021 0417 ?Gross per 24 hour  ?Intake 668.43 ml  ?Output 450 ml  ?Net 218.43 ml  ? ? ?Net IO Since Admission: -1,453.03 mL [11/07/21 0625] ? ?Physical Exam: ?Physical Exam ?Constitutional:   ?   Comments: somnolent  ?HENT:  ?   Head: Normocephalic.  ?   Comments: Abrasion present on right temple ?   Mouth/Throat:  ?   Mouth: Mucous membranes are moist.  ?Cardiovascular:  ?   Rate and Rhythm: Normal rate and regular rhythm.  ?   Heart sounds: No murmur heard. ?Abdominal:  ?   General: Abdomen is flat.  ?   Palpations: Abdomen is soft.  ?Musculoskeletal:  ?   Comments: Bandage in place over right hand  ?Skin: ?   General: Skin is  warm and dry.  ?Neurological:  ?   Mental Status: He is disoriented.  ?   Comments: Eyes are open, no tremors appreciated, not oriented to self, place, or time  ?   ? ?Patient Lines/Drains/Airways Status   ? ? Active Line/Drains/Airways   ? ? Name Placement date Placement time Site Days  ? Peripheral IV 11/02/21 20 G Right Antecubital 11/02/21  1511  Antecubital  1  ? External Urinary Catheter 11/03/21  0340  --  less than 1  ? Incision (Closed) 11/11/14 Hip Right 11/11/14  2212  -- 2549  ? ?  ?  ? ?  ? ? ?Pertinent Labs: ? ?  Latest Ref Rng & Units 11/07/2021  ?  1:19 AM 11/06/2021  ?  2:41 AM 11/05/2021  ?  1:26 AM  ?CBC  ?WBC 4.0 - 10.5 K/uL 7.3   8.8   9.4    ?Hemoglobin 13.0 - 17.0 g/dL 11.5   12.1   11.5    ?Hematocrit 39.0 - 52.0 % 33.4   33.9   33.8    ?Platelets 150 - 400 K/uL 169   152   131    ? ? ? ?  Latest Ref Rng & Units 11/07/2021  ?  1:19 AM 11/06/2021  ?  2:41 AM 11/05/2021  ?  1:26 AM  ?CMP  ?Glucose 70 - 99 mg/dL 103   105   91    ?BUN 8 - 23 mg/dL 28   37   40    ?Creatinine 0.61 - 1.24 mg/dL 1.09   1.43   1.98    ?Sodium 135 - 145 mmol/L 142   143   142    ?Potassium 3.5 - 5.1 mmol/L 3.2   3.4   3.3    ?Chloride 98 - 111 mmol/L 100   99   98    ?CO2 22 - 32 mmol/L 32   34   37    ?Calcium 8.9 - 10.3 mg/dL 7.9   8.7   9.5    ?Total Protein 6.5 - 8.1 g/dL 4.8   5.0   4.7    ?Total Bilirubin 0.3 - 1.2 mg/dL 0.7   1.0   0.9    ?Alkaline Phos 38 - 126 U/L 79   79   75    ?AST 15 - 41 U/L 50   58   58    ?ALT 0 - 44 U/L 38   44   45    ? ? ?Recent Labs  ?  11/05/21 ?1610 11/05/21 ?1150 11/05/21 ?1546  ?GLUCAP 86 96 100*  ? ?  ? ?Pertinent Imaging: ?No results found. ? ?ASSESSMENT/PLAN:  ?Assessment: ?Principal Problem: ?  Hypercalcemia ?Active Problems: ?  Delirium tremens ([place]) ?  Hypokalemia ?  Solitary pulmonary nodule ?  Hand laceration ?  Essential hypertension ?  Alcohol withdrawal ([place]) ?  AKI (acute kidney injury) ([place]) ?  Atrial fibrillation with RVR ([place]) ?  Acute HFrEF (heart failure with  reduced ejection fraction) ([place]) ? ? ?Luis Marquez is a 61 y.o. with a pertinent PMH of hypertension, emphysema, and prior tobacco use, who presented with fall and admitted for severe symptomatic high hypercalcemia, now in delirium tremens.  ? ?Plan: ?Delirium tremens ?Metabolic encephalopathy ?CIWA scores are improving. Mentation is improving. He received 6 mg of Ativan in the last 24 hours.  He should be approaching the latter part of withdrawal. On exam he appears calm, but remains confused. He is no longer tremulous.   ?-CiWA ?-Continue prn Ativan ?-NPO as unsafe to tolerate PO intake ? ?Atrial fibrillation with RVR ?Heart rates in 80s.  Telemetry was reviewed and appears that he has been fluctuating between sinus rhythm and atrial fibrillation. Likely being driven by acute illness/alcohol withdrawal. CHAD2VASC score of 2 with heart failure and hypertension. Could benefit from anticoagulation for primary stroke prophylaxis for at least a few months. ?-continue amiodarone infusion as unable to take PO medication ?-Lovenox at anticoagulation dose, change to DOAC when able to take PO.  ? ?Acute heart failure with reduced ejection fraction ?Echo showed EF of 20-25% with regional wall motion abnormalities. Etiology could be alcohol, chronic hypertension, or ischemic. Will start goal directed medical therapy when he stabilizes and can take orals. RAAS inhibition may be limited by decreased GFR. Still appears hypovolemic today, no need for diuresis at this time. ?-ischemic evaluation with cardiology once more stable ? ?Symptomatic hypercalcemia- resolved ?Hypokalemia ?Hypochloridemia ?Elevated bicarb ?Chloride depleted metabolic alkalosis ?He was given 1 dose of zoledronic acid on 4/18 and two doses of calcitonin. Electrolytes have improved. Unclear cause of hypercalcemia.  PTH was low at 8.  Thyroid disease, granuloma disease have been ruled out. Differentials include paraneoplastic syndrome due to cancer  vs lytic  bone disease vs multiple myeloma (lights chain ratio wnl).  He will need further evaluation as outpatient with PET scan for malignancy. ?-PTHrP pending ?-PET scan as outpatient for the pulmonary nodule ?-Daily BMP ?  ?Acute kidney injury ?Creatinine improved from 1.43 to 1.09 today. His baseline appears to be <1. He appears euvolemic on exam. ?-Daily BMP/ CMP ?  ?COPD ?Home medications include Breo inhaler. He is not at the point he can tolerate inhaler. Wheezing present in upper and lower lungs bilaterally. ?-duonebs scheduled ? ?Elevated transaminases ?LFTs are improving. ?-CMP daily ? ? ?Best Practice: ?Diet: NPO ?IVF: Fluids: none ?VTE: Lovenox per pharmacy for afib ?Code: Full ?AB: none ?Therapy Recs: holding off on PT until withdrawal symptoms improve ?Family: son updated at bedside ?DISPO: Anticipated discharge in 2-3 days  pending resolution of electrolyte derangements and alcohol withdrawal. ? ?Signature: ?Daleen Bo. Sybel Standish, D.O.  ?Internal Medicine Resident, PGY-1 ?[facility]  ?Pager: [phone] ?6:25 AM, 11/07/2021  ? ?Please contact the on call pager after 5 pm and on weekends at [phone].  ?

## 2021-11-07 NOTE — Progress Notes (Signed)
Notified via secure chat that patient had increased belly breathing and was not able to be transitioned off of nonrebreather.  On exam he was sedated, pupils were equal and reactive to light, he was able to state name and able to follow commands and move all 4 extremities.  On auscultation there was diffuse wheezing in lung fields bilaterally.  DuoNeb was initiated with improvement in accessory muscle usage. ?

## 2021-11-08 LAB — CBC
HCT: 35.2 % — ABNORMAL LOW (ref 39.0–52.0)
Hemoglobin: 12 g/dL — ABNORMAL LOW (ref 13.0–17.0)
MCH: 34.2 pg — ABNORMAL HIGH (ref 26.0–34.0)
MCHC: 34.1 g/dL (ref 30.0–36.0)
MCV: 100.3 fL — ABNORMAL HIGH (ref 80.0–100.0)
Platelets: 221 10*3/uL (ref 150–400)
RBC: 3.51 MIL/uL — ABNORMAL LOW (ref 4.22–5.81)
RDW: 13.4 % (ref 11.5–15.5)
WBC: 6.8 10*3/uL (ref 4.0–10.5)
nRBC: 0 % (ref 0.0–0.2)

## 2021-11-08 LAB — MAGNESIUM
Magnesium: 1.6 mg/dL — ABNORMAL LOW (ref 1.7–2.4)
Magnesium: 1.7 mg/dL (ref 1.7–2.4)

## 2021-11-08 LAB — BASIC METABOLIC PANEL
Anion gap: 8 (ref 5–15)
BUN: 21 mg/dL (ref 8–23)
CO2: 32 mmol/L (ref 22–32)
Calcium: 7.6 mg/dL — ABNORMAL LOW (ref 8.9–10.3)
Chloride: 104 mmol/L (ref 98–111)
Creatinine, Ser: 1.04 mg/dL (ref 0.61–1.24)
GFR, Estimated: >60 mL/min (ref >60)
Glucose, Bld: 78 mg/dL (ref 70–99)
Potassium: 2.9 mmol/L — ABNORMAL LOW (ref 3.5–5.1)
Sodium: 144 mmol/L (ref 135–145)

## 2021-11-08 LAB — TROPONIN I (HIGH SENSITIVITY)
Troponin I (High Sensitivity): 76 ng/L — ABNORMAL HIGH (ref <18)
Troponin I (High Sensitivity): 79 ng/L — ABNORMAL HIGH (ref <18)

## 2021-11-08 MED ORDER — SACUBITRIL-VALSARTAN 49-51 MG PO TABS
1.0000 | ORAL_TABLET | Freq: Two times a day (BID) | ORAL | Status: DC
  Administered 2021-11-09 – 2021-11-11 (×5): 1 via ORAL
  Filled 2021-11-08 (×6): qty 1

## 2021-11-08 MED ORDER — CHLORDIAZEPOXIDE HCL 25 MG PO CAPS
25.0000 mg | ORAL_CAPSULE | Freq: Two times a day (BID) | ORAL | Status: AC
  Administered 2021-11-08 – 2021-11-09 (×3): 25 mg via ORAL
  Filled 2021-11-08 (×3): qty 1

## 2021-11-08 MED ORDER — METOPROLOL TARTRATE 12.5 MG HALF TABLET
12.5000 mg | ORAL_TABLET | Freq: Two times a day (BID) | ORAL | Status: DC
  Administered 2021-11-08: 12.5 mg via ORAL
  Filled 2021-11-08: qty 1

## 2021-11-08 MED ORDER — IPRATROPIUM-ALBUTEROL 0.5-2.5 (3) MG/3ML IN SOLN
3.0000 mL | Freq: Three times a day (TID) | RESPIRATORY_TRACT | Status: DC
  Administered 2021-11-08 (×2): 3 mL via RESPIRATORY_TRACT
  Filled 2021-11-08 (×2): qty 3

## 2021-11-08 MED ORDER — AMIODARONE HCL 200 MG PO TABS
200.0000 mg | ORAL_TABLET | Freq: Two times a day (BID) | ORAL | Status: DC
Start: 2021-11-08 — End: 2021-11-16
  Administered 2021-11-08 – 2021-11-16 (×16): 200 mg via ORAL
  Filled 2021-11-08 (×16): qty 1

## 2021-11-08 MED ORDER — UMECLIDINIUM BROMIDE 62.5 MCG/ACT IN AEPB
1.0000 | INHALATION_SPRAY | Freq: Every day | RESPIRATORY_TRACT | Status: DC
  Administered 2021-11-10 – 2021-11-16 (×4): 1 via RESPIRATORY_TRACT
  Filled 2021-11-08 (×3): qty 7

## 2021-11-08 MED ORDER — MAGNESIUM SULFATE 2 GM/50ML IV SOLN
2.0000 g | Freq: Once | INTRAVENOUS | Status: AC
  Administered 2021-11-08: 2 g via INTRAVENOUS
  Filled 2021-11-08: qty 50

## 2021-11-08 MED ORDER — ALBUTEROL SULFATE (2.5 MG/3ML) 0.083% IN NEBU
2.5000 mg | INHALATION_SOLUTION | RESPIRATORY_TRACT | Status: DC | PRN
  Filled 2021-11-08: qty 3

## 2021-11-08 MED ORDER — POTASSIUM CHLORIDE 10 MEQ/100ML IV SOLN: 10.0000 meq | INTRAVENOUS | Status: DC

## 2021-11-08 MED ORDER — POTASSIUM CHLORIDE 10 MEQ/100ML IV SOLN
10.0000 meq | INTRAVENOUS | Status: AC
  Administered 2021-11-08 (×4): 10 meq via INTRAVENOUS
  Filled 2021-11-08 (×4): qty 100

## 2021-11-08 MED ORDER — LORAZEPAM 1 MG PO TABS: 1.0000 mg | ORAL_TABLET | ORAL | Status: DC | PRN

## 2021-11-08 MED ORDER — CARVEDILOL 6.25 MG PO TABS: 6.2500 mg | ORAL_TABLET | Freq: Two times a day (BID) | ORAL | Status: DC

## 2021-11-08 MED ORDER — SPIRONOLACTONE 12.5 MG HALF TABLET
12.5000 mg | ORAL_TABLET | Freq: Every day | ORAL | Status: DC
  Administered 2021-11-08 – 2021-11-09 (×2): 12.5 mg via ORAL
  Filled 2021-11-08 (×2): qty 1

## 2021-11-08 MED ORDER — LORAZEPAM 2 MG/ML IJ SOLN
1.0000 mg | INTRAMUSCULAR | Status: DC | PRN
  Administered 2021-11-08 – 2021-11-09 (×3): 2 mg via INTRAVENOUS
  Filled 2021-11-08 (×3): qty 1

## 2021-11-08 MED ORDER — LOSARTAN POTASSIUM 50 MG PO TABS
25.0000 mg | ORAL_TABLET | Freq: Every day | ORAL | Status: DC
  Administered 2021-11-08: 25 mg via ORAL
  Filled 2021-11-08: qty 1

## 2021-11-08 NOTE — Progress Notes (Addendum)
? ?HD#6 ?SUBJECTIVE:  ?Patient Summary: Luis Marquez is a 61 y.o. with a pertinent PMH of hypertension, emphysema, and prior tobacco use, who presented with fall and admitted for severe symptomatic hypercalcemia who went into alcohol withdraw. ? ?Overnight Events: none ? ?Interim History: Patient assessed at bedside this AM.  He appears more awake this morning.  He has mitts in place, but is not tremulous.  No other concerns at this time.  He is not able to pass bedside swallow testing. ? ?Addendum 1330: ?Paged as patient's brother is at bedside.  I spoke with his brother to give him an update.  We talked about importance of further evaluation to work-up what caused hypercalcemia and patient needing PET scan at discharge.  We also spoke about cardiology coming on board to evaluate heart failure.  No other concerns at this time.  Patient was able to pass bedside swallow test with nursing. ? ?OBJECTIVE:  ?Vital Signs: ?Vitals:  ? 11/08/21 0340 11/08/21 0806 11/08/21 0820 11/08/21 1208  ?BP: (!) 155/95  (!) 164/108 (!) 173/98  ?Pulse: 97  99 96  ?Resp: (!) 21  (!) 30 18  ?Temp: 98.9 ?F (37.2 ?C)  98.3 ?F (36.8 ?C) 97.7 ?F (36.5 ?C)  ?TempSrc: Axillary  Axillary Oral  ?SpO2: 93% 93% 96% (!) 88%  ?Weight:      ?Height:      ? ?Supplemental O2: Nasal Cannula ?SpO2: (!) 88 % ?O2 Flow Rate (L/min): 5 L/min ? ?Filed Weights  ? 11/06/21 0500 11/07/21 0400 11/08/21 0105  ?Weight: 95 kg 95.6 kg 95.8 kg  ? ? ? ?Intake/Output Summary (Last 24 hours) at 11/08/2021 1330 ?Last data filed at 11/08/2021 7371 ?Gross per 24 hour  ?Intake 630.09 ml  ?Output 800 ml  ?Net -169.91 ml  ? ? ?Net IO Since Admission: -2,023.07 mL [11/08/21 1330] ? ?Physical Exam: ?Physical Exam ?Constitutional:   ?   Comments: sleepy  ?HENT:  ?   Head: Normocephalic.  ?   Comments: Abrasion present on right temple ?   Mouth/Throat:  ?   Mouth: Mucous membranes are moist.  ?Cardiovascular:  ?   Rate and Rhythm: Normal rate and regular rhythm.  ?   Heart sounds:  No murmur heard. ?Pulmonary:  ?   Effort: Pulmonary effort is normal.  ?   Breath sounds: Normal breath sounds.  ?Abdominal:  ?   General: Abdomen is flat.  ?   Palpations: Abdomen is soft.  ?Musculoskeletal:     ?   General: Normal range of motion.  ?   Comments: Bandage in place over right hand  ?Skin: ?   General: Skin is warm and dry.  ?Neurological:  ?   Mental Status: He is disoriented.  ?   Comments: Oriented to self only  ?   ? ?Patient Lines/Drains/Airways Status   ? ? Active Line/Drains/Airways   ? ? Name Placement date Placement time Site Days  ? Peripheral IV 11/02/21 20 G Right Antecubital 11/02/21  1511  Antecubital  1  ? External Urinary Catheter 11/03/21  0340  --  less than 1  ? Incision (Closed) 11/11/14 Hip Right 11/11/14  2212  -- 2549  ? ?  ?  ? ?  ? ? ?Pertinent Labs: ? ?  Latest Ref Rng & Units 11/08/2021  ? 12:58 AM 11/07/2021  ?  1:19 AM 11/06/2021  ?  2:41 AM  ?CBC  ?WBC 4.0 - 10.5 K/uL 6.8   7.3   8.8    ?  Hemoglobin 13.0 - 17.0 g/dL 12.0   11.5   12.1    ?Hematocrit 39.0 - 52.0 % 35.2   33.4   33.9    ?Platelets 150 - 400 K/uL 221   169   152    ? ? ? ?  Latest Ref Rng & Units 11/08/2021  ? 12:58 AM 11/07/2021  ?  1:19 AM 11/06/2021  ?  2:41 AM  ?CMP  ?Glucose 70 - 99 mg/dL 78   103   105    ?BUN 8 - 23 mg/dL 21   28   37    ?Creatinine 0.61 - 1.24 mg/dL 1.04   1.09   1.43    ?Sodium 135 - 145 mmol/L 144   142   143    ?Potassium 3.5 - 5.1 mmol/L 2.9   3.2   3.4    ?Chloride 98 - 111 mmol/L 104   100   99    ?CO2 22 - 32 mmol/L 32   32   34    ?Calcium 8.9 - 10.3 mg/dL 7.6   7.9   8.7    ?Total Protein 6.5 - 8.1 g/dL  4.8   5.0    ?Total Bilirubin 0.3 - 1.2 mg/dL  0.7   1.0    ?Alkaline Phos 38 - 126 U/L  79   79    ?AST 15 - 41 U/L  50   58    ?ALT 0 - 44 U/L  38   44    ? ? ?Recent Labs  ?  11/05/21 ?1546  ?GLUCAP 100*  ? ?  ? ?Pertinent Imaging: ?No results found. ? ?ASSESSMENT/PLAN:  ?Assessment: ?Principal Problem: ?  Hypercalcemia ?Active Problems: ?  Delirium tremens ([place]) ?   Hypokalemia ?  Solitary pulmonary nodule ?  Hand laceration ?  Essential hypertension ?  Alcohol withdrawal ([place]) ?  AKI (acute kidney injury) ([place]) ?  Atrial fibrillation with RVR ([place]) ?  Acute HFrEF (heart failure with reduced ejection fraction) ([place]) ? ? ?Luis Marquez is a 61 y.o. with a pertinent PMH of hypertension, emphysema, and prior tobacco use, who presented with fall and admitted for severe symptomatic high hypercalcemia, now in alcohol withdraw. ? ?Plan: ?Alcohol withdraw ?Metabolic encephalopathy ?CIWA scores are improving. Mentation is improving.  Will initiate Librium taper.  He is able to pass a bedside swallow test today. ?-CiWA q 4 hrs ?-Librium 25 mg qd ?-Continue prn Ativan ? ?Acute heart failure with reduced ejection fraction ?Echo showed EF of 20-25% with regional wall motion abnormalities. Etiology could be alcohol, chronic hypertension, or ischemic.  Cardiology team consulted and their concern for Takotsubo.  They plan to do a diagnostic cath.  He was started on Entresto and spironolactone.  Cardiology team recommended holding off on beta-blockade for now with RBBB on EKG. ?-ischemic evaluation with cardiology ?Delene Loll  ?- spironolactone 12.5 mg qd ? ?Atrial fibrillation with RVR-resolved ?Heart rates in 80s.  Cardiology consulted with recommendation to continue amiodarone 200 mg twice daily. CHAD2VASC score of 2 with heart failure and hypertension. Could benefit from anticoagulation for primary stroke prophylaxis for at least a few months. ?-Switch from amiodarone IV to PO ?-Lovenox at anticoagulation dose ? ?Symptomatic hypercalcemia- resolved ?Hypokalemia ?Hypochloridemia ?Elevated bicarb ?Chloride depleted metabolic alkalosis ?He was given 1 dose of zoledronic acid on 4/18 and two doses of calcitonin. Electrolytes have improved. Unclear cause of hypercalcemia.  PTH was low at 8.  Thyroid disease, granuloma disease have been  ruled out. Differentials include paraneoplastic syndrome due  to cancer vs lytic bone disease vs multiple myeloma (lights chain ratio wnl).  He will need further evaluation as outpatient with PET scan for malignancy. ?-PTHrP pending ?-PET scan as outpatient for the pulmonary nodule ?-Daily BMP ?  ?Acute kidney injury- resolved ?Creatinine stable at 1.04. His baseline appears to be <1. He appears euvolemic on exam. ?-Daily BMP ?  ?COPD ?Home medications include Breo inhaler. He is not at the point he can tolerate inhaler. Wheezing present in upper and lower lungs bilaterally. Will switch from duonebs to incruse inhaler. ?-incruse inhaler ? ? ?Best Practice: ?Diet: NPO ?IVF: Fluids: none ?VTE: Lovenox per pharmacy for afib ?Code: Full ?AB: none ?Therapy Recs: inpatient rehab ?Family: brother updated at bedside ?DISPO: Anticipated discharge in 2-3 days  pending further evaluation of cardiomyopathy. ? ?Signature: ?Daleen Bo. Deyana Wnuk, D.O.  ?Internal Medicine Resident, PGY-1 ?[facility]  ?Pager: [phone] ?1:30 PM, 11/08/2021  ? ?Please contact the on call pager after 5 pm and on weekends at [phone].  ?

## 2021-11-08 NOTE — Progress Notes (Signed)
Physical Therapy Treatment ?Patient Details ?Name: Luis Marquez ?MRN: [id number] ?DOB: Aug 03, 1960 ?Today's Date: 11/08/2021 ? ? ?History of Present Illness 61 y/o male presented to ED on 11/02/21 after fall in parking lot and hitting R hand with urinary incontinence. CT head negative. Admitted for severe electrolyte imbalance, AKI, and ETOH withdrawal. PMH: HTN, alcohol use disorder ? ?  ?PT Comments  ? ? Pt was seen for mobility on bed, with pt actively resisting to move even to midline alignment center of bed. Gave pt a review of ROM to legs to try and increase active use of legs, with pt requiring a lot of help to get partial completion of the sequence.  He has been a bit lethargic in last couple visits, was difficult to understand his speaking to PT, and is requiring max assist help to total assist for all movement.  Focus on trying to get to chair as his cognition allows, to increase active engagement with staff and visitors.      ?Recommendations for follow up therapy are one component of a multi-disciplinary discharge planning process, led by the attending physician.  Recommendations may be updated based on patient status, additional functional criteria and insurance authorization. ? ?Follow Up Recommendations ? Skilled nursing-short term rehab (<3 hours/day) ?  ?  ?Assistance Recommended at Discharge Frequent or constant Supervision/Assistance  ?Patient can return home with the following Two people to help with walking and/or transfers;A lot of help with bathing/dressing/bathroom;Assistance with cooking/housework;Direct supervision/assist for medications management;Direct supervision/assist for financial management;Assist for transportation;Help with stairs or ramp for entrance ?  ?Equipment Recommendations ? None recommended by PT (TBD)  ?  ?Recommendations for Other Services   ? ? ?  ?Precautions / Restrictions Precautions ?Precautions: Fall ?Precaution Comments: CIWA, R hand laceration ?Restrictions ?Weight  Bearing Restrictions: No  ?  ? ?Mobility ? Bed Mobility ?Overal bed mobility: Needs Assistance ?Bed Mobility:  (repositioning on the bed) ?  ?  ?  ?  ?  ?General bed mobility comments: pt was initially repositioned to midline and is very resistive and unable to do the motor planning to help with the task even cued ?  ? ?Transfers ?  ?  ?  ?  ?  ?  ?  ?  ?  ?General transfer comment: pt is too lethargic to attempt ?  ? ?Ambulation/Gait ?  ?  ?  ?  ?  ?  ?  ?General Gait Details: unable ? ? ?Stairs ?  ?  ?  ?  ?  ? ? ?Wheelchair Mobility ?  ? ?Modified Rankin (Stroke Patients Only) ?  ? ? ?  ?Balance   ?  ?  ?  ?  ?  ?  ?  ?  ?  ?  ?  ?  ?  ?  ?  ?  ?  ?  ?  ? ?  ?Cognition Arousal/Alertness: Lethargic ?Behavior During Therapy: Flat affect ?Overall Cognitive Status: Difficult to assess ?  ?  ?  ?  ?  ?  ?  ?  ?  ?  ?  ?  ?  ?  ?  ?  ?General Comments: slow to follow instructions and requires specific repetitive cues to move with PT, partial follow through ?  ?  ? ?  ?Exercises General Exercises - Lower Extremity ?Ankle Circles/Pumps: AAROM, 5 reps ?Quad Sets: AROM, 10 reps ?Heel Slides: AAROM, 10 reps ?Hip ABduction/ADduction: AAROM, 10 reps ?Straight Leg Raises: AAROM, 10 reps ?Hip Flexion/Marching:  AAROM, 10 reps ? ?  ?General Comments General comments (skin integrity, edema, etc.): pt is requiring a great deal of help to even begin to move on bed, cannot sit up due to resistance to move and even with LE exercises is mumbling and difficult to understand, talking about helping his mother exercise.  Pt is not able to give time frames for his comments. ?  ?  ? ?Pertinent Vitals/Pain Pain Assessment ?Pain Assessment: Faces ?Faces Pain Scale: Hurts a little bit ?Breathing: occasional labored breathing, short period of hyperventilation ?Negative Vocalization: none ?Facial Expression: smiling or inexpressive ?Body Language: relaxed ?Pain Location: general ?Pain Descriptors / Indicators: Tightness ?Pain Intervention(s):  Monitored during session, Repositioned  ? ? ?Home Living   ?  ?  ?  ?  ?  ?  ?  ?  ?  ?   ?  ?Prior Function    ?  ?  ?   ? ?PT Goals (current goals can now be found in the care plan section)   ? ?  ?Frequency ? ? ? Min 2X/week ? ? ? ?  ?PT Plan Current plan remains appropriate  ? ? ?Co-evaluation   ?  ?  ?  ?  ? ?  ?AM-PAC PT "6 Clicks" Mobility   ?Outcome Measure ? Help needed turning from your back to your side while in a flat bed without using bedrails?: Total ?Help needed moving from lying on your back to sitting on the side of a flat bed without using bedrails?: Total ?Help needed moving to and from a bed to a chair (including a wheelchair)?: Total ?Help needed standing up from a chair using your arms (e.g., wheelchair or bedside chair)?: Total ?Help needed to walk in hospital room?: Total ?Help needed climbing 3-5 steps with a railing? : Total ?6 Click Score: 6 ? ?  ?End of Session Equipment Utilized During Treatment: Oxygen ?Activity Tolerance: Patient limited by lethargy ?Patient left: in bed;with call bell/phone within reach;with bed alarm set ?Nurse Communication: Mobility status ?PT Visit Diagnosis: Muscle weakness (generalized) (M62.81);Other abnormalities of gait and mobility (R26.89) ?  ? ? ?Time: [id number] ?PT Time Calculation (min) (ACUTE ONLY): 18 min ? ?Charges:  $Therapeutic Exercise: 8-22 mins  ?Ramond Dial ?11/08/2021, 3:46 PM ? ?Mee Hives, PT PhD ?Acute Rehab Dept. Number: [facility] [id number] and [place] [phone] ? ? ?

## 2021-11-08 NOTE — Progress Notes (Signed)
CSW continuing to follow for medical progression. Discharge planning barriers includes lack of insurance.  ? ?Cristobal Goldmann ?LCSW, MSW, MHA ? ?

## 2021-11-08 NOTE — Consult Note (Addendum)
?  ?Advanced Heart Failure Team Consult Note ? ? ?Primary Physician: Gildardo Pounds, NP ?PCP-Cardiologist:  None ? ?Reason for Consultation: Acute HFrEF  ? ?HPI:   ? ?Luis Marquez is seen today for evaluation of Acute HFrEF at the request of Dr Evette Doffing.  ? ?Luis Marquez is a  61 year old with HTN, emphysema, ETOH abuse,and tobacco abuse.  ? ?Preseneted to [facility] after fall with injury to R hand and contusion to forehead. ? Syncope. Contusion to R forehead. Laceration repaired.  CT head no acute findings.CT chest with ground glass and solid nodular densities in RUL and RLL.  Admitted with hypercalcemia. Treated with IV fluids and IV zometa.Pertinent  labs: Calcium 14.5 , K  < 2, UDS negative. SPEP no M Spike. Kappa free light chain 37.5, Lamda free light chains 35.5, PSA 1.6, and lactic acid 1. Hospital course complicated by ETOH abuse/acute HFrEF/A fib RVR.  ? ?Started on diltiazem drip. Echo EF 20-25% RV mildly reduced. Diltiazem stopped and switched to amiodarone started. Converted to NSR 4/22. Echo reviewed by Dr Haroldine Laws. EF 25%.with possible [place].  ? ? ?Review of Systems: [y] = yes, [ ]  = no  ? ?General: Weight gain [ ] ; Weight loss [ ] ; Anorexia [ ] ; Fatigue [ ] ; Fever [ ] ; Chills [ ] ; Weakness [ ]   ?Cardiac: Chest pain/pressure [ ] ; Resting SOB [ ] ; Exertional SOB [ ] ; Orthopnea [ ] ; Pedal Edema [ ] ; Palpitations [ ] ; Syncope [Y]; Presyncope [ ] ; Paroxysmal nocturnal dyspnea[ ]   ?Pulmonary: Cough [ ] ; Wheezing[ ] ; Hemoptysis[ ] ; Sputum [ ] ; Snoring [ ]   ?GI: Vomiting[ ] ; Dysphagia[ ] ; Melena[ ] ; Hematochezia [ ] ; Heartburn[ ] ; Abdominal pain [ ] ; Constipation [ ] ; Diarrhea [ ] ; BRBPR [ ]   ?GU: Hematuria[ ] ; Dysuria [ ] ; Nocturia[ ]   ?Vascular: Pain in legs with walking [ ] ; Pain in feet with lying flat [ ] ; Non-healing sores [ ] ; Stroke [ ] ; TIA [ ] ; Slurred speech [ ] ;  ?Neuro: Headaches[ ] ; Vertigo[ ] ; Seizures[ ] ; Paresthesias[ ] ;Blurred vision [ ] ; Diplopia [ ] ; Vision changes [ ]   ?Ortho/Skin:  Arthritis [ ] ; Joint pain [Y ]; Muscle pain [ ] ; Joint swelling [ ] ; Back Pain [ Y]; Rash [ ]   ?Psych: Depression[ ] ; Anxiety[ ]   ?Heme: Bleeding problems [ ] ; Clotting disorders [ ] ; Anemia [ ]   ?Endocrine: Diabetes [ ] ; Thyroid dysfunction[ ]  ? ?Home Medications ?Prior to Admission medications   ?Medication Sig Start Date End Date Taking? Authorizing Provider  ?albuterol (VENTOLIN HFA) 108 (90 Base) MCG/ACT inhaler INHALE 2 PUFFS INTO THE LUNGS EVERY 6 (SIX) HOURS AS NEEDED FOR WHEEZING OR SHORTNESS OF BREATH (COUGH). 05/10/21  Yes Gildardo Pounds, NP  ?amLODipine (NORVASC) 10 MG tablet TAKE 1 TABLET (10 MG TOTAL) BY MOUTH DAILY. 09/14/20 11/02/21 Yes Gildardo Pounds, NP  ?ferrous sulfate 325 (65 FE) MG tablet Take 1 tablet (325 mg total) by mouth 2 (two) times daily with a meal. 03/05/20 11/02/21 Yes Gildardo Pounds, NP  ?fluticasone furoate-vilanterol (BREO ELLIPTA) 100-25 MCG/ACT AEPB Inhale 1 puff into the lungs daily. 06/07/21  Yes Charlott Rakes, MD  ?furosemide (LASIX) 20 MG tablet Take 1 tablet (20 mg total) by mouth daily as needed. 06/07/21 11/02/21 Yes Charlott Rakes, MD  ?gabapentin (NEURONTIN) 300 MG capsule Take 1 capsule (300 mg total) by mouth at bedtime. 06/18/21 11/02/21 Yes Gildardo Pounds, NP  ?GLUCOS-CHONDROIT-MSM-C-HYAL PO Take 1 tablet by mouth daily.   Yes [provider]  ?lisinopril (ZESTRIL) 20 MG tablet TAKE 1 TABLET (20 MG TOTAL) BY MOUTH DAILY. 05/10/21 05/10/22 Yes Gildardo Pounds, NP  ?Multiple Vitamin (MULTIVITAMIN WITH MINERALS) TABS tablet Take 1 tablet by mouth daily.   Yes [provider]  ?naproxen sodium (ALEVE) 220 MG tablet Take 220 mg by mouth 2 (two) times daily as needed (moderate pain).   Yes [provider]  ? ? ?Past Medical History: ?Past Medical History:  ?Diagnosis Date  ? Hypertension   ? ? ?Past Surgical History: ?Past Surgical History:  ?Procedure Laterality Date  ? HERNIA REPAIR    ? INTRAMEDULLARY (IM) NAIL INTERTROCHANTERIC Right  11/11/2014  ? Procedure: INTRAMEDULLARY (IM) NAIL INTERTROCHANTRIC;  Surgeon: Paralee Cancel, MD;  Location: [facility];  Service: Orthopedics;  Laterality: Right;  ? Rod right leg    ? ? ?Family History: ?Family History  ?Problem Relation Age of Onset  ? Diabetes Mother   ? ? ?Social History: ?Social History  ? ?Socioeconomic History  ? Marital status: Single  ?  Spouse name: Not on file  ? Number of children: Not on file  ? Years of education: Not on file  ? Highest education level: Not on file  ?Occupational History  ? Not on file  ?Tobacco Use  ? Smoking status: Every Day  ?  Packs/day: 0.25  ?  Types: Cigarettes  ? Smokeless tobacco: Never  ?Vaping Use  ? Vaping Use: Never used  ?Substance and Sexual Activity  ? Alcohol use: Not Currently  ? Drug use: Not Currently  ? Sexual activity: Yes  ?Other Topics Concern  ? Not on file  ?Social History Narrative  ? ** Merged History Encounter **  ?    ? ?Social Determinants of Health  ? ?Financial Resource Strain: Not on file  ?Food Insecurity: Not on file  ?Transportation Needs: Not on file  ?Physical Activity: Not on file  ?Stress: Not on file  ?Social Connections: Not on file  ? ? ?Allergies:  ?No Known Allergies ? ?Objective:   ? ?Vital Signs:   ?Temp:  [97.7 ?F (36.5 ?C)-98.9 ?F (37.2 ?C)] 97.7 ?F (36.5 ?C) (04/24 1208) ?Pulse Rate:  [84-99] 96 (04/24 1208) ?Resp:  [18-30] 18 (04/24 1208) ?BP: (155-173)/(87-108) 173/98 (04/24 1208) ?SpO2:  [88 %-96 %] 94 % (04/24 1447) ?Weight:  [95.8 kg] 95.8 kg (04/24 0105) ?Last BM Date : 11/06/21 ? ?Weight change: ?Filed Weights  ? 11/06/21 0500 11/07/21 0400 11/08/21 0105  ?Weight: 95 kg 95.6 kg 95.8 kg  ? ? ?Intake/Output:  ? ?Intake/Output Summary (Last 24 hours) at 11/08/2021 1459 ?Last data filed at 11/08/2021 [id number] ?Gross per 24 hour  ?Intake 486.11 ml  ?Output 800 ml  ?Net -313.89 ml  ?  ? ? ?Physical Exam  ?  ?General:  Arousable but quickly falls back asleep.  ?HEENT: normal ?Neck: supple. JVP 5-6 . Carotids 2+ bilat; no bruits.  No lymphadenopathy or thyromegaly appreciated. ?Cor: PMI nondisplaced. Regular rate & rhythm. No rubs, gallops or murmurs. ?Lungs: clear ?Abdomen: soft, nontender, nondistended. No hepatosplenomegaly. No bruits or masses. Good bowel sounds. ?Extremities: no cyanosis, clubbing, rash, edema ?Neuro: Arousable but quickly falls asleep.moves all 4 extremities w/o difficulty.  ? ?Telemetry  ?SR 80-90s  ? ?EKG  ? 11/03/21 A fib RVR 143 bpm ?Labs  ? ?Basic Metabolic Panel: ?Recent Labs  ?Lab 11/03/21 ?[id number] 11/03/21 ?0604 11/04/21 ?0119 11/05/21 ?0126 11/06/21 ?[id number] 11/07/21 ?0119 11/08/21 ?[id number]  ?NA 136   < > 137 142 143  142 144  ?K 2.5*   < > 3.7 3.3* 3.4* 3.2* 2.9*  ?CL 83*   < > 89* 98 99 100 104  ?CO2 >45*   < > 36* 37* 34* 32 32  ?GLUCOSE 102*   < > 95 91 105* 103* 78  ?BUN 17   < > 30* 40* 37* 28* 21  ?CREATININE 1.46*   < > 2.04* 1.98* 1.43* 1.09 1.04  ?CALCIUM 12.4*   < > 11.2* 9.5 8.7* 7.9* 7.6*  ?MG 2.2   < > 2.0 1.6* 1.5* 1.5* 1.6*  ?PHOS 2.0*  --  2.6 3.2 2.3* 2.7  --   ? < > = values in this interval not displayed.  ? ? ?Liver Function Tests: ?Recent Labs  ?Lab 11/02/21 ?1313 11/04/21 ?0119 11/05/21 ?0126 11/06/21 ?0241 11/07/21 ?0119  ?AST 165* 139* 58* 58* 50*  ?ALT 64* 63* 45* 44 38  ?ALKPHOS 103 89 75 79 79  ?BILITOT 1.7* 1.8*  1.8* 0.9 1.0 0.7  ?PROT 5.4* 5.1* 4.7* 5.0* 4.8*  ?ALBUMIN 2.5* 2.3* 2.0* 2.0* 2.0*  ? ?Recent Labs  ?Lab 11/02/21 ?1313  ?LIPASE 27  ? ?No results for input(s): AMMONIA in the last 168 hours. ? ?CBC: ?Recent Labs  ?Lab 11/04/21 ?0119 11/05/21 ?0126 11/06/21 ?0241 11/07/21 ?0119 11/08/21 ?[id number]  ?WBC 7.6 9.4 8.8 7.3 6.8  ?HGB 12.5* 11.5* 12.1* 11.5* 12.0*  ?HCT 35.8* 33.8* 33.9* 33.4* 35.2*  ?MCV 98.4 100.6* 99.4 100.9* 100.3*  ?PLT 138* 131* 152 169 221  ? ? ?Cardiac Enzymes: ?No results for input(s): CKTOTAL, CKMB, CKMBINDEX, TROPONINI in the last 168 hours. ? ?BNP: ?BNP (last 3 results) ?No results for input(s): BNP in the last 8760 hours. ? ?ProBNP (last 3 results) ?No results for  input(s): PROBNP in the last 8760 hours. ? ? ?CBG: ?Recent Labs  ?Lab 11/02/21 ?0804 11/05/21 ?[id number] 11/05/21 ?1150 11/05/21 ?1546  ?GLUCAP 128* 86 96 100*  ? ? ?Coagulation Studies: ?No results for input(s)

## 2021-11-09 LAB — PHOSPHORUS: Phosphorus: 2 mg/dL — ABNORMAL LOW (ref 2.5–4.6)

## 2021-11-09 LAB — CBC
HCT: 34.3 % — ABNORMAL LOW (ref 39.0–52.0)
Hemoglobin: 11.9 g/dL — ABNORMAL LOW (ref 13.0–17.0)
MCH: 34.7 pg — ABNORMAL HIGH (ref 26.0–34.0)
MCHC: 34.7 g/dL (ref 30.0–36.0)
MCV: 100 fL (ref 80.0–100.0)
Platelets: 259 10*3/uL (ref 150–400)
RBC: 3.43 MIL/uL — ABNORMAL LOW (ref 4.22–5.81)
RDW: 13.3 % (ref 11.5–15.5)
WBC: 7 10*3/uL (ref 4.0–10.5)
nRBC: 0 % (ref 0.0–0.2)

## 2021-11-09 LAB — MAGNESIUM: Magnesium: 1.7 mg/dL (ref 1.7–2.4)

## 2021-11-09 LAB — BASIC METABOLIC PANEL
Anion gap: 8 (ref 5–15)
BUN: 18 mg/dL (ref 8–23)
CO2: 31 mmol/L (ref 22–32)
Calcium: 7.4 mg/dL — ABNORMAL LOW (ref 8.9–10.3)
Chloride: 106 mmol/L (ref 98–111)
Creatinine, Ser: 1.04 mg/dL (ref 0.61–1.24)
GFR, Estimated: >60 mL/min (ref >60)
Glucose, Bld: 95 mg/dL (ref 70–99)
Potassium: 2.8 mmol/L — ABNORMAL LOW (ref 3.5–5.1)
Sodium: 145 mmol/L (ref 135–145)

## 2021-11-09 LAB — PTH-RELATED PEPTIDE: PTH-related peptide: <2 pmol/L

## 2021-11-09 MED ORDER — SPIRONOLACTONE 12.5 MG HALF TABLET
12.5000 mg | ORAL_TABLET | Freq: Once | ORAL | Status: AC
Start: 2021-11-09 — End: 2021-11-09
  Administered 2021-11-09: 12.5 mg via ORAL
  Filled 2021-11-09: qty 1

## 2021-11-09 MED ORDER — MAGNESIUM SULFATE 2 GM/50ML IV SOLN
2.0000 g | Freq: Once | INTRAVENOUS | Status: AC
  Administered 2021-11-09: 2 g via INTRAVENOUS
  Filled 2021-11-09: qty 50

## 2021-11-09 MED ORDER — POTASSIUM CHLORIDE 10 MEQ/100ML IV SOLN
10.0000 meq | INTRAVENOUS | Status: AC
  Administered 2021-11-09 (×4): 10 meq via INTRAVENOUS
  Filled 2021-11-09 (×4): qty 100

## 2021-11-09 MED ORDER — LORAZEPAM 1 MG PO TABS
1.0000 mg | ORAL_TABLET | ORAL | Status: DC | PRN
  Administered 2021-11-09 (×2): 2 mg via ORAL
  Administered 2021-11-10: 3 mg via ORAL
  Filled 2021-11-09: qty 2
  Filled 2021-11-09: qty 3
  Filled 2021-11-09: qty 2

## 2021-11-09 MED ORDER — POTASSIUM CHLORIDE 20 MEQ PO PACK
40.0000 meq | PACK | Freq: Two times a day (BID) | ORAL | Status: DC
Start: 2021-11-09 — End: 2021-11-10
  Administered 2021-11-09 (×2): 40 meq via ORAL
  Filled 2021-11-09 (×2): qty 2

## 2021-11-09 MED ORDER — LORAZEPAM 2 MG/ML IJ SOLN: 1.0000 mg | INTRAMUSCULAR | Status: DC | PRN

## 2021-11-09 MED ORDER — K PHOS MONO-SOD PHOS DI & MONO 155-852-130 MG PO TABS
500.0000 mg | ORAL_TABLET | Freq: Once | ORAL | Status: AC
  Administered 2021-11-09: 500 mg via ORAL
  Filled 2021-11-09: qty 2

## 2021-11-09 MED ORDER — SPIRONOLACTONE 25 MG PO TABS
25.0000 mg | ORAL_TABLET | Freq: Every day | ORAL | Status: DC
  Administered 2021-11-10 – 2021-11-16 (×7): 25 mg via ORAL
  Filled 2021-11-09 (×7): qty 1

## 2021-11-09 NOTE — Progress Notes (Addendum)
[facility] CSW reached out to [employer] to see about screening [facility] for [facility]. [employer] will look into a payor source.  ? ?Barriers to discharge include lack of health insurance for recommendation of SNF. ? ?CSW will continue to follow throughout discharge. ?

## 2021-11-09 NOTE — Progress Notes (Addendum)
? ?HD#7 ?SUBJECTIVE:  ?Patient Summary: Luis Marquez is a 61 y.o. with a pertinent PMH of hypertension, emphysema, and prior tobacco use, who presented with fall and admitted for severe symptomatic hypercalcemia who went into alcohol withdraw. ? ?Overnight Events: none ? ?Interim History: Patient assessed at bedside this AM.  He was alert and oriented but remains confused about why he is in the hospital.  No other concerns at this time. ? ? ?OBJECTIVE:  ?Vital Signs: ?Vitals:  ? 11/09/21 0401 11/09/21 0409 11/09/21 0500 11/09/21 1231  ?BP: (!) 154/97   130/86  ?Pulse: (!) 102 100  96  ?Resp: 20 (!) 22  (!) 29  ?Temp:    97.9 ?F (36.6 ?C)  ?TempSrc:    Oral  ?SpO2: (!) 85% 94%  98%  ?Weight:   95.6 kg   ?Height:      ? ?Supplemental O2: Nasal Cannula ?SpO2: 98 % ?O2 Flow Rate (L/min): 5 L/min ? ?Filed Weights  ? 11/07/21 0400 11/08/21 0105 11/09/21 0500  ?Weight: 95.6 kg 95.8 kg 95.6 kg  ? ? ? ?Intake/Output Summary (Last 24 hours) at 11/09/2021 1530 ?Last data filed at 11/09/2021 0700 ?Gross per 24 hour  ?Intake 254.91 ml  ?Output 1200 ml  ?Net -945.09 ml  ? ? ?Net IO Since Admission: -2,601.77 mL [11/09/21 1530] ? ?Physical Exam: ?Physical Exam ?Constitutional:   ?   Comments: sleepy  ?HENT:  ?   Head: Normocephalic.  ?   Comments: Abrasion present on right temple ?   Mouth/Throat:  ?   Mouth: Mucous membranes are moist.  ?Cardiovascular:  ?   Rate and Rhythm: Normal rate and regular rhythm.  ?   Heart sounds: No murmur heard. ?Pulmonary:  ?   Effort: Pulmonary effort is normal.  ?   Breath sounds: Normal breath sounds.  ?Abdominal:  ?   General: Abdomen is flat.  ?   Palpations: Abdomen is soft.  ?Musculoskeletal:     ?   General: Normal range of motion.  ?   Comments: Bandage in place over right hand  ?Skin: ?   General: Skin is warm and dry.  ?Neurological:  ?   Mental Status: He is disoriented.  ?   Comments: Oriented to self only  ?   ? ?Patient Lines/Drains/Airways Status   ? ? Active Line/Drains/Airways   ? ?  Name Placement date Placement time Site Days  ? Peripheral IV 11/02/21 20 G Right Antecubital 11/02/21  1511  Antecubital  1  ? External Urinary Catheter 11/03/21  0340  --  less than 1  ? Incision (Closed) 11/11/14 Hip Right 11/11/14  2212  -- 2549  ? ?  ?  ? ?  ? ? ?Pertinent Labs: ? ?  Latest Ref Rng & Units 11/09/2021  ?  1:04 AM 11/08/2021  ? 12:58 AM 11/07/2021  ?  1:19 AM  ?CBC  ?WBC 4.0 - 10.5 K/uL 7.0   6.8   7.3    ?Hemoglobin 13.0 - 17.0 g/dL 11.9   12.0   11.5    ?Hematocrit 39.0 - 52.0 % 34.3   35.2   33.4    ?Platelets 150 - 400 K/uL 259   221   169    ? ? ? ?  Latest Ref Rng & Units 11/09/2021  ?  1:04 AM 11/08/2021  ? 12:58 AM 11/07/2021  ?  1:19 AM  ?CMP  ?Glucose 70 - 99 mg/dL 95   78   103    ?  BUN 8 - 23 mg/dL _0 ?Creatinine 0.61 - 1.24 mg/dL 1.04   1.04   1.09    ?Sodium 135 - 145 mmol/L 145   144   142    ?Potassium 3.5 - 5.1 mmol/L 2.8   2.9   3.2    ?Chloride 98 - 111 mmol/L 106   104   100    ?CO2 22 - 32 mmol/L 31   32   32    ?Calcium 8.9 - 10.3 mg/dL 7.4   7.6   7.9    ?Total Protein 6.5 - 8.1 g/dL   4.8    ?Total Bilirubin 0.3 - 1.2 mg/dL   0.7    ?Alkaline Phos 38 - 126 U/L   79    ?AST 15 - 41 U/L   50    ?ALT 0 - 44 U/L   38    ? ? ?No results for input(s): GLUCAP in the last 72 hours. ?  ? ?Pertinent Imaging: ?No results found. ? ?ASSESSMENT/PLAN:  ?Assessment: ?Principal Problem: ?  Hypercalcemia ?Active Problems: ?  Delirium tremens ([place]) ?  Hypokalemia ?  Solitary pulmonary nodule ?  Hand laceration ?  Essential hypertension ?  Alcohol withdrawal ([place]) ?  AKI (acute kidney injury) ([place]) ?  Atrial fibrillation with RVR ([place]) ?  Acute HFrEF (heart failure with reduced ejection fraction) ([place]) ? ? ?Luis Marquez is a 61 y.o. with a pertinent PMH of hypertension, emphysema, and prior tobacco use, who presented with fall and admitted for severe symptomatic high hypercalcemia, now in alcohol withdraw. ? ?Plan: ?Acute heart failure with reduced ejection fraction ?Echo showed EF of  20-25% with regional wall motion abnormalities and apical hypokinesis. Etiology could be alcohol, chronic hypertension, or ischemic.  Cardiology team consulted and their are concerned for [place].  They plan to do a diagnostic cath once more medically stable.  He was started on Entresto and spironolactone.   ?-ischemic evaluation with cardiology ?- Entresto 49-51 mg BID ?- spironolactone 25 mg qd ? ?Atrial fibrillation with RVR-resolved ?Heart rates in 80s.  Cardiology consulted with recommendation to continue amiodarone 200 mg twice daily. CHAD2VASC score of 2 with heart failure and hypertension. Could benefit from anticoagulation for primary stroke prophylaxis for at least a few months. ?-Continue amiodarone 200 mg ?-Lovenox at anticoagulation dose ? ?Alcohol withdraw ?Metabolic encephalopathy ?CIWA scores are improving. Mentation is improving.  He received milligrams of Ativan and 25 mg of Librium yesterday. ?-CiWA q 4 hrs ?-Librium 25 mg twice daily, last dose this evening ?-Continue prn Ativan ? ?Symptomatic hypercalcemia- resolved ?Hypokalemia ?Hypochloridemia ?Elevated bicarb ?Chloride depleted metabolic alkalosis ?He was given 1 dose of zoledronic acid on 4/18 and two doses of calcitonin. Electrolytes have improved. Unclear cause of hypercalcemia.  PTH was low at 8.  Thyroid disease, granuloma disease have been ruled out. Differentials include paraneoplastic syndrome due to cancer vs lytic bone disease vs multiple myeloma (lights chain ratio wnl). PTHrP was less than 2 He will need further evaluation as outpatient with PET scan for lytic lesions causing hypercalcemia. ?-PET scan as outpatient for the pulmonary nodule ?-Daily BMP ?  ?Acute kidney injury- resolved ?Creatinine stable. His baseline appears to be <1. He appears euvolemic on exam. ?-Daily BMP ?  ?COPD ?Home medications include Breo inhaler. He is not at the point he can tolerate inhaler. Wheezing present in upper and lower lungs bilaterally.  Will switch from duonebs to incruse inhaler. ?-  incruse inhaler ? ?Best Practice: ?Diet: heart healthy ?IVF: Fluids: none ?VTE: Lovenox per pharmacy for afib ?Code: Full ?AB: none ?Therapy Recs: SNF ?Family: unable to reach patient's brother via phone ?DISPO: Anticipated discharge in 2-3 days  pending further evaluation of cardiomyopathy. ? ?Signature: ?Daleen Bo. Jaycub Noorani, D.O.  ?Internal Medicine Resident, PGY-1 ?[facility]  ?Pager: [phone] ?3:30 PM, 11/09/2021  ? ?Please contact the on call pager after 5 pm and on weekends at [phone].  ?

## 2021-11-09 NOTE — TOC Initial Note (Signed)
Transition of Care (TOC) - Initial/Assessment Note  ? ? ?Patient Details  ?Name: Luis Marquez ?MRN: [id number] ?Date of Birth: 1960/07/20 ? ?Transition of [facility]) CM/SW Contact:    ?Luis Grammes Rexene Alberts, RN ?Phone Number: [phone] ?11/09/2021, 5:41 PM ? ?Clinical Narrative:                 ?[facility] TOC CM spoke to pt at bedside. States he has tried to quit drinking and smoking in the past. Discussed options to assist with ETOH abuse. States he has been to [facility] in the past. Pt states he works full-time and has insurance but does not have insurance card. Explained having insurance the hospital will send to insurance carrier prior to him receiving a bill. States he has been on current job for 6 months. Pt states his PCP, Luis Marquez at [facility]. Explained the importance of having insurance info available. Will have his meds come up from [facility] at dc. Meds not covered under HF funds will use MATCH to assist with meds.  ? ?Expected Discharge Plan: Home/Self Care ?Barriers to Discharge: Continued Medical Work up ? ? ?Patient Goals and CMS Choice ?  ?CMS Medicare.gov Compare Post Acute Care list provided to:: Patient ?  ? ?Expected Discharge Plan and Services ?Expected Discharge Plan: Home/Self Care ?  ?Discharge Planning Services: CM Consult ?  ?  ?                ?  ?  ?  ?  ?  ?  ?  ?  ?  ?  ? ?Prior Living Arrangements/Services ?  ?Lives with:: Roommate ?Patient language and need for interpreter reviewed:: Yes ?Do you feel safe going back to the place where you live?: Yes      ?Need for Family Participation in Patient Care: No (Comment) ?Care giver support system in place?: No (comment) ?  ?Criminal Activity/Legal Involvement Pertinent to Current Situation/Hospitalization: No - Comment as needed ? ?Activities of Daily Living ?Home Assistive Devices/Equipment: Dentures (specify type), Eyeglasses, Cane (specify quad or straight) ?ADL Screening (condition at time of admission) ?Patient's cognitive ability adequate to  safely complete daily activities?: Yes ?Is the patient deaf or have difficulty hearing?: No ?Does the patient have difficulty seeing, even when wearing glasses/contacts?: No ?Does the patient have difficulty concentrating, remembering, or making decisions?: No ?Patient able to express need for assistance with ADLs?: Yes ?Does the patient have difficulty dressing or bathing?: No ?Independently performs ADLs?: No ?Communication: Independent ?Dressing (OT): Needs assistance ?Is this a change from baseline?: Change from baseline, expected to last <3days ?Grooming: Independent ?Feeding: Independent ?Bathing: Independent ?Toileting: Needs assistance ?Is this a change from baseline?: Change from baseline, expected to last <3 days ?In/Out Bed: Dependent ?Is this a change from baseline?: Change from baseline, expected to last <3 days ?Walks in Home: Independent with device (comment) ?Does the patient have difficulty walking or climbing stairs?: Yes ?Weakness of Legs: Both ?Weakness of Arms/Hands: Both ? ?Permission Sought/Granted ?Permission sought to share information with : Case Manager, Family Supports, PCP ?Permission granted to share information with : Yes, Verbal Permission Granted ? Share Information with NAME: Luis Marquez ?   ? Permission granted to share info w Relationship: son ? Permission granted to share info w Contact Information: [phone] ? ?Emotional Assessment ?Appearance:: Appears stated age ?Attitude/Demeanor/Rapport: Engaged ?Affect (typically observed): Accepting ?Orientation: : Oriented to Self, Oriented to Place, Oriented to  Time, Oriented to Situation ?  ?Psych Involvement: No (  comment) ? ?Admission diagnosis:  Hypercalcemia [E83.52] ?Hypokalemia [E87.6] ?Hypochloremia [E87.8] ?Hyponatremia [E87.1] ?Elevated liver enzymes [R74.8] ?Electrolyte abnormality [E87.8] ?AKI (acute kidney injury) ([place]) [N17.9] ?Laceration of right hand without foreign body, initial encounter [S61.411A] ?Patient  Active Problem List  ? Diagnosis Date Noted  ? Acute HFrEF (heart failure with reduced ejection fraction) ([place]) 11/04/2021  ? Atrial fibrillation with RVR ([place])   ? Hypokalemia 11/03/2021  ? Solitary pulmonary nodule 11/03/2021  ? Emphysema lung ([place]) 11/03/2021  ? Hand laceration 11/03/2021  ? Essential hypertension 11/03/2021  ? Alcohol withdrawal ([place]) 11/03/2021  ? AKI (acute kidney injury) ([place])   ? Hypercalcemia 11/02/2021  ? Delirium tremens ([place]) 11/11/2014  ? Intertrochanteric fracture of right femur ([place]) 11/11/2014  ? Fracture, intertrochanteric, right femur ([place]) 11/11/2014  ? Anemia 11/11/2014  ? ?PCP:  Gildardo Pounds, NP ?Pharmacy:   ?[facility] at [address], [address] ?[place] [address] ?Phone: [phone] Fax: [phone] ? ? ? ? ?Social Determinants of Health (SDOH) Interventions ?  ? ?Readmission Risk Interventions ?   ? View : No data to display.  ?  ?  ?  ? ? ? ?

## 2021-11-09 NOTE — Progress Notes (Addendum)
? ? Advanced Heart Failure Rounding Note ? ?PCP-Cardiologist: None  ? ?Subjective:   ? ? ?Awake and answering questions. Appears somewhat confused. ? ?Denies dyspnea.  ? ?Hypertensive this am. ? ?Scr stable, K 2.8 and Mag 1.7.  ? ?Objective:   ?Weight Range: ?95.6 kg ?Body mass index is 27.81 kg/m?.  ? ?Vital Signs:   ?Temp:  [97.7 ?F (36.5 ?C)-98.2 ?F (36.8 ?C)] 98.2 ?F (36.8 ?C) (04/25 0308) ?Pulse Rate:  [82-102] 100 (04/25 0409) ?Resp:  [18-22] 22 (04/25 0409) ?BP: (130-173)/(84-100) 154/97 (04/25 0401) ?SpO2:  [85 %-96 %] 94 % (04/25 0409) ?Weight:  [95.6 kg] 95.6 kg (04/25 0500) ?Last BM Date : 11/08/21 ? ?Weight change: ?Filed Weights  ? 11/07/21 0400 11/08/21 0105 11/09/21 0500  ?Weight: 95.6 kg 95.8 kg 95.6 kg  ? ? ?Intake/Output:  ? ?Intake/Output Summary (Last 24 hours) at 11/09/2021 1009 ?Last data filed at 11/09/2021 0700 ?Gross per 24 hour  ?Intake 621.3 ml  ?Output 1200 ml  ?Net -578.7 ml  ?  ? ? ?Physical Exam  ?  ?General:  Lying in bed. No distress. ?HEENT: Normal ?Neck: Supple. No JVD. Carotids 2+ bilat; no bruits.  ?Cor: PMI nondisplaced. Regular rate & rhythm, tachy. No rubs, gallops or murmurs. ?Lungs: Clear ?Abdomen: Soft, nontender, nondistended. No hepatosplenomegaly.  ?Extremities: No cyanosis, clubbing, rash, edema ?Neuro: Awake. Follows commands. Knows where he is and his age, thinks he was hospitalized on the 7th ? ? ?Telemetry  ? ?Sinus tach 100s ? ?Labs  ?  ?CBC ?Recent Labs  ?  11/08/21 ?[id number] 11/09/21 ?0104  ?WBC 6.8 7.0  ?HGB 12.0* 11.9*  ?HCT 35.2* 34.3*  ?MCV 100.3* 100.0  ?PLT 221 259  ? ?Basic Metabolic Panel ?Recent Labs  ?  11/07/21 ?0119 11/08/21 ?[id number] 11/08/21 ?2115 11/09/21 ?0104  ?NA 142 144  --  145  ?K 3.2* 2.9*  --  2.8*  ?CL 100 104  --  106  ?CO2 32 32  --  31  ?GLUCOSE 103* 78  --  95  ?BUN 28* 21  --  18  ?CREATININE 1.09 1.04  --  1.04  ?CALCIUM 7.9* 7.6*  --  7.4*  ?MG 1.5* 1.6* 1.7 1.7  ?PHOS 2.7  --   --  2.0*  ? ?Liver Function Tests ?Recent Labs  ?   11/07/21 ?0119  ?AST 50*  ?ALT 38  ?ALKPHOS 79  ?BILITOT 0.7  ?PROT 4.8*  ?ALBUMIN 2.0*  ? ?No results for input(s): LIPASE, AMYLASE in the last 72 hours. ?Cardiac Enzymes ?No results for input(s): CKTOTAL, CKMB, CKMBINDEX, TROPONINI in the last 72 hours. ? ?BNP: ?BNP (last 3 results) ?No results for input(s): BNP in the last 8760 hours. ? ?ProBNP (last 3 results) ?No results for input(s): PROBNP in the last 8760 hours. ? ? ?D-Dimer ?No results for input(s): DDIMER in the last 72 hours. ?Hemoglobin A1C ?No results for input(s): HGBA1C in the last 72 hours. ?Fasting Lipid Panel ?No results for input(s): CHOL, HDL, LDLCALC, TRIG, CHOLHDL, LDLDIRECT in the last 72 hours. ?Thyroid Function Tests ?No results for input(s): TSH, T4TOTAL, T3FREE, THYROIDAB in the last 72 hours. ? ?Invalid input(s): FREET3 ? ?Other results: ? ? ?Imaging  ? ? ?No results found. ? ? ?Medications:   ? ? ?Scheduled Medications: ? amiodarone  200 mg Oral BID  ? chlordiazePOXIDE  25 mg Oral Q12H  ? enoxaparin (LOVENOX) injection  100 mg Subcutaneous BID  ? folic acid  1 mg Oral Daily  ? mouth  rinse  15 mL Mouth Rinse BID  ? multivitamin with minerals  1 tablet Oral Daily  ? potassium chloride  40 mEq Oral BID  ? sacubitril-valsartan  1 tablet Oral BID  ? spironolactone  12.5 mg Oral Daily  ? thiamine  100 mg Oral Daily  ? Or  ? thiamine  100 mg Intravenous Daily  ? umeclidinium bromide  1 puff Inhalation Daily  ? ? ?Infusions: ? potassium chloride 10 mEq (11/09/21 0951)  ? ? ?PRN Medications: ?acetaminophen, albuterol, LORazepam **OR** LORazepam ? ? ? ?Patient Profile  ? ?Mr Formella is a  61 year old with HTN, emphysema, ETOH abuse,and tobacco abuse.  ?  ?Admitted with symptomatic hypercalcemia. ? ?Assessment/Plan  ? ?1. Hypercalcemia  ?- Treatment and workup per primary team.  ?- Calcium on admit 14.5 . Calcium now down to 7.4 ?  ?2. Delirium  ?- ETOH withdrawal. On CIWA protocol.  ?- More awake. Still seems confused today. Receiving PRN ativan ?   ?3. A Fib RVR ?- Developed A fib RVR 11/03/21. Converted on amio drip.  ?- Went back in NSR on 4/22. Amio drip stopped 04/24. ?- Continue amiodarone 200 mg BID. SR today. ?- Replace Mag and K aggressively. ?- Anticoagulated with Lovenox ?  ?4.  Acute HFrEF  ?- New reduced EF. Unclear etiology. No previous history of coronary disease.  ?- Echo EF 25%, mid and apical hypokinesis with sparing of base, RV mildly reduced. Echo concerning for Takotsubo. Will eventually need R/LHC once more stable medically. ETOH abuse/HTN  maybe contributing.  ?- Volume status stable. No diuretics for now.  ?- Started entresto 49/51 mg BID this am ?- Hold off on beta blocker for now ?- Increase spiro to 25 mg daily ?- Consider SGLT2i in future, worry a bit about hygiene/risk of UTI ?- Daily BMET ? ?5. Hypomagnesia ?-Mag 1.7. Supp again today and recheck tomorrow ? ?6. Hypokalemia ?- K 2.8 ?- Supp aggressively again today ? ?7. ETOH abuse ?-Drinks 20 beers every 2 days ?-Withdrawal as above ? ?SDOH: ?-Uninsured. [place] on board ?-SNF recommended at discharge. May not be able to return home safely. ? ? ?Length of Stay: 7 ? ?FINCH, LINDSAY N, PA-C  ?11/09/2021, 10:09 AM ? ?Advanced Heart Failure Team ?Pager [phone] (M-F; 7a - 5p)  ?Please contact [facility] for night-coverage after hours (5p -7a ) and weekends on [email or web address] ? ? ?Patient seen and examined with the above-signed Advanced Practice Provider and/or Housestaff. I personally reviewed laboratory data, imaging studies and relevant notes. I independently examined the patient and formulated the important aspects of the plan. I have edited the note to reflect any of my changes or salient points. I have personally discussed the plan with the patient and/or family. ? ?More alert today but remains confused. Denies CP or SOB. No further AF. No bleeding on lovenox. Calcium normalized. K and Mg still low.  ? ?General:  Sitting up in bed . No resp difficulty ?HEENT: normal ?Neck: supple. no  JVD. Carotids 2+ bilat; no bruits. No lymphadenopathy or thryomegaly appreciated. ?Cor: PMI nondisplaced. Regular rate & rhythm. No rubs, gallops or murmurs. ?Lungs: clear ?Abdomen: soft, nontender, nondistended. No hepatosplenomegaly. No bruits or masses. Good bowel sounds. ?Extremities: no cyanosis, clubbing, rash, edema ?Neuro: alert but confused, cranial nerves grossly intact. moves all 4 extremities w/o difficulty. Affect pleasant ? ?Continue to titrate GDMT and supp lytes. Continue amio and lvenox for PAF. Ideally would do R/L cath this week but would like to  see him less confused prior to proceeding.  ? ?Glori Bickers, MD  ?6:44 PM ? ?  ?

## 2021-11-10 LAB — CBC
HCT: 39.2 % (ref 39.0–52.0)
Hemoglobin: 12.9 g/dL — ABNORMAL LOW (ref 13.0–17.0)
MCH: 33.5 pg (ref 26.0–34.0)
MCHC: 32.9 g/dL (ref 30.0–36.0)
MCV: 101.8 fL — ABNORMAL HIGH (ref 80.0–100.0)
Platelets: 315 10*3/uL (ref 150–400)
RBC: 3.85 MIL/uL — ABNORMAL LOW (ref 4.22–5.81)
RDW: 13.6 % (ref 11.5–15.5)
WBC: 9.1 10*3/uL (ref 4.0–10.5)
nRBC: 0 % (ref 0.0–0.2)

## 2021-11-10 LAB — BASIC METABOLIC PANEL
Anion gap: 12 (ref 5–15)
BUN: 16 mg/dL (ref 8–23)
CO2: 25 mmol/L (ref 22–32)
Calcium: 7.4 mg/dL — ABNORMAL LOW (ref 8.9–10.3)
Chloride: 110 mmol/L (ref 98–111)
Creatinine, Ser: 0.98 mg/dL (ref 0.61–1.24)
GFR, Estimated: >60 mL/min (ref >60)
Glucose, Bld: 77 mg/dL (ref 70–99)
Potassium: 3.2 mmol/L — ABNORMAL LOW (ref 3.5–5.1)
Sodium: 147 mmol/L — ABNORMAL HIGH (ref 135–145)

## 2021-11-10 LAB — MAGNESIUM: Magnesium: 1.7 mg/dL (ref 1.7–2.4)

## 2021-11-10 MED ORDER — ASPIRIN 81 MG PO CHEW
81.0000 mg | CHEWABLE_TABLET | ORAL | Status: AC
  Administered 2021-11-11: 81 mg via ORAL
  Filled 2021-11-10: qty 1

## 2021-11-10 MED ORDER — POTASSIUM CHLORIDE 20 MEQ PO PACK
40.0000 meq | PACK | Freq: Two times a day (BID) | ORAL | Status: AC
  Administered 2021-11-10 (×2): 40 meq via ORAL
  Filled 2021-11-10 (×2): qty 2

## 2021-11-10 MED ORDER — SODIUM CHLORIDE 0.9 % IV SOLN
250.0000 mL | INTRAVENOUS | Status: DC | PRN
Start: 2021-11-10 — End: 2021-11-11

## 2021-11-10 MED ORDER — MAGNESIUM SULFATE 2 GM/50ML IV SOLN
2.0000 g | Freq: Once | INTRAVENOUS | Status: AC
  Administered 2021-11-10: 2 g via INTRAVENOUS
  Filled 2021-11-10: qty 50

## 2021-11-10 MED ORDER — SODIUM CHLORIDE 0.9 % IV SOLN: INTRAVENOUS | Status: DC

## 2021-11-10 MED ORDER — SODIUM CHLORIDE 0.9% FLUSH
3.0000 mL | Freq: Two times a day (BID) | INTRAVENOUS | Status: DC
  Administered 2021-11-10 – 2021-11-16 (×10): 3 mL via INTRAVENOUS

## 2021-11-10 MED ORDER — SODIUM CHLORIDE 0.9% FLUSH: 3.0000 mL | INTRAVENOUS | Status: DC | PRN

## 2021-11-10 MED ORDER — LORAZEPAM 1 MG PO TABS: 1.0000 mg | ORAL_TABLET | Freq: Four times a day (QID) | ORAL | Status: DC | PRN

## 2021-11-10 MED ORDER — ENOXAPARIN SODIUM 100 MG/ML IJ SOSY: 95.0000 mg | PREFILLED_SYRINGE | Freq: Two times a day (BID) | INTRAMUSCULAR | Status: DC

## 2021-11-10 MED ORDER — CHLORDIAZEPOXIDE HCL 25 MG PO CAPS
25.0000 mg | ORAL_CAPSULE | Freq: Once | ORAL | Status: AC
Start: 2021-11-10 — End: 2021-11-10
  Administered 2021-11-10: 25 mg via ORAL
  Filled 2021-11-10: qty 1

## 2021-11-10 NOTE — TOC Progression Note (Addendum)
Transition of Care (TOC) - Progression Note  ? ? ?Patient Details  ?Name: Luis Marquez ?MRN: [id number] ?Date of Birth: 11/16/1960 ? ?Transition of Care (TOC) CM/SW Contact  ?Marializ Ferrebee, LCSW ?Phone Number: ?11/10/2021, 2:19 PM ? ?Clinical Narrative:    ?HF CSW attempted to speak with [name] at bedside regarding alcohol cessation, however he wasn't making much sense. CSW will follow up with [name] tomorrow and try again. ? ?CSW will continue to follow throughout discharge. ? ? ?Expected Discharge Plan: Home/Self Care ?Barriers to Discharge: Continued Medical Work up ? ?Expected Discharge Plan and Services ?Expected Discharge Plan: Home/Self Care ?  ?Discharge Planning Services: CM Consult ?  ?  ?                ?  ?  ?  ?  ?  ?  ?  ?  ?  ?  ? ? ?Social Determinants of Health (SDOH) Interventions ?  ? ?Readmission Risk Interventions ?   ? View : No data to display.  ?  ?  ?  ? ? ?

## 2021-11-10 NOTE — Progress Notes (Addendum)
? ?HD#8 ?SUBJECTIVE:  ?Patient Summary:  ?Luis Marquez is a 61 y.o. with a pertinent PMH of emphysema, and prior tobacco use, who presented with fall and admitted for severe symptomatic high hypercalcemia, now in alcohol withdraw. His mentation is improving, but he remains disoriented with likely a component of hospital delirium. He needs ischemic evaluation with cardiology once mentation improves. ? ?Overnight Events: none ? ?Interim History: Patient assessed at bedside this AM.  Oriented to person and place, but remains confused to why he was in the hospital.  He reports visual hallucinations overnight.  We talked about him being in the hospital for alcohol withdrawal and heart failure. ? ? ?OBJECTIVE:  ?Vital Signs: ?Vitals:  ? 11/09/21 2330 11/10/21 0000 11/10/21 0400 11/10/21 0500  ?BP: 124/84 127/86 (!) 130/97   ?Pulse: 86  100   ?Resp: 20  20 20   ?Temp: 97.9 ?F (36.6 ?C)  98 ?F (36.7 ?C)   ?TempSrc: Oral  Oral   ?SpO2: 98%     ?Weight:    91.8 kg  ?Height:      ? ?Supplemental O2: Nasal Cannula ?SpO2: 98 % ?O2 Flow Rate (L/min): 5 L/min ? ?Filed Weights  ? 11/08/21 0105 11/09/21 0500 11/10/21 0500  ?Weight: 95.8 kg 95.6 kg 91.8 kg  ? ? ? ?Intake/Output Summary (Last 24 hours) at 11/10/2021 0731 ?Last data filed at 11/10/2021 0600 ?Gross per 24 hour  ?Intake 470 ml  ?Output 700 ml  ?Net -230 ml  ? ? ?Net IO Since Admission: -2,831.77 mL [11/10/21 0731] ? ?Physical Exam: ?Constitutional: Sitting up in bed, alert, disoriented to situation ?HENT: normocephalic, healing abrasion on right temple, mucous membranes dry ?Eyes: conjunctiva non-erythematous ?Neck: supple ?Cardiovascular: Regular rate and rhythm, no m/r/g ?Pulmonary/Chest: Normal work of breathing on room air, some expiratory wheezing noted in upper lungs bilaterally ?Abdominal: soft, non-tender, non-distended ?MSK: normal bulk and tone ?Skin: warm and dry ?Psych: Delirious    ? ?Patient Lines/Drains/Airways Status   ? ? Active Line/Drains/Airways   ? ?  Name Placement date Placement time Site Days  ? Peripheral IV 11/02/21 20 G Right Antecubital 11/02/21  1511  Antecubital  1  ? External Urinary Catheter 11/03/21  0340  --  less than 1  ? Incision (Closed) 11/11/14 Hip Right 11/11/14  2212  -- 2549  ? ?  ?  ? ?  ? ? ?Pertinent Labs: ? ?  Latest Ref Rng & Units 11/10/2021  ? 12:45 AM 11/09/2021  ?  1:04 AM 11/08/2021  ? 12:58 AM  ?CBC  ?WBC 4.0 - 10.5 K/uL 9.1   7.0   6.8    ?Hemoglobin 13.0 - 17.0 g/dL 12.9   11.9   12.0    ?Hematocrit 39.0 - 52.0 % 39.2   34.3   35.2    ?Platelets 150 - 400 K/uL 315   259   221    ? ? ? ?  Latest Ref Rng & Units 11/10/2021  ? 12:45 AM 11/09/2021  ?  1:04 AM 11/08/2021  ? 12:58 AM  ?CMP  ?Glucose 70 - 99 mg/dL 77   95   78    ?BUN 8 - 23 mg/dL 16   18   21     ?Creatinine 0.61 - 1.24 mg/dL 0.98   1.04   1.04    ?Sodium 135 - 145 mmol/L 147   145   144    ?Potassium 3.5 - 5.1 mmol/L 3.2   2.8   2.9    ?  Chloride 98 - 111 mmol/L 110   106   104    ?CO2 22 - 32 mmol/L 25   31   32    ?Calcium 8.9 - 10.3 mg/dL 7.4   7.4   7.6    ? ? ?No results for input(s): GLUCAP in the last 72 hours. ?  ? ?Pertinent Imaging: ?No results found. ? ?ASSESSMENT/PLAN:  ?Assessment: ?Principal Problem: ?  Hypercalcemia ?Active Problems: ?  Delirium tremens ([place]) ?  Hypokalemia ?  Solitary pulmonary nodule ?  Hand laceration ?  Essential hypertension ?  Alcohol withdrawal ([place]) ?  AKI (acute kidney injury) ([place]) ?  Atrial fibrillation with RVR ([place]) ?  Acute HFrEF (heart failure with reduced ejection fraction) ([place]) ? ? ?Luis Marquez is a 61 y.o. with a pertinent PMH of emphysema, and prior tobacco use, who presented with fall and admitted for severe symptomatic high hypercalcemia, now in alcohol withdraw. His mentation is improving, but he remains disoriented with likely a component of hospital delirium. He needs ischemic evaluation with cardiology once mentation improves. ? ?Plan: ?Acute heart failure with reduced ejection fraction ?Patient was found to have  reduced EF 20 to 25% with regional wall motion abnormalities and hypokinesis.  This could be secondary to alcohol, chronic hypertension, or ischemia.  Appreciate cardiology teams assistance in this case.  There is concern for Takotsubo with the echo finding showing apical hypokinesis.  Cardiology planning to complete a diagnostic right and left heart cath once patient's mentation improves.  GDMT has been initiated and he is tolerating well. ?- ischemic evaluation with cardiology once mentation improves ?- Entresto 49-51 mg BID ?- spironolactone 25 mg qd ?- appreciate social work assistance with applying for insurance ? ?Atrial fibrillation with RVR-resolved ?Heart rates in the 90s to 110s.  Telemetry shows sinus tachycardia.  Remains on p.o. amiodarone. ?- Continue amiodarone 200 mg ?- Lovenox at anticoagulation dose ? ?Alcohol withdraw ?Metabolic encephalopathy ?He is alert and oriented to self.  However he remains delirious and reports visual hallucinations overnight.  I do think that hospital delirium is a component of this.  He will receive 1 dose of Librium today and I will discontinue PRN ativan. ?-CiWA q 6 hrs ?-Librium 25 mg once ?-Discontinue as needed Ativan ? ?Symptomatic hypercalcemia- resolved ?Hypokalemia ?Hypochloridemia ?Elevated bicarb ?Chloride depleted metabolic alkalosis ?He was given 1 dose of zoledronic acid on 4/18 and two doses of calcitonin. Electrolytes have improved. Unclear cause of hypercalcemia.  PTH was low at 8.  Thyroid disease, granuloma disease have been ruled out. Differentials include paraneoplastic syndrome due to cancer vs lytic bone disease vs multiple myeloma (lights chain ratio wnl). PTHrP was less 2. He will need further evaluation as outpatient with PET scan for lytic lesions causing hypercalcemia. ?-PET scan as outpatient for the pulmonary nodule ?-Daily BMP ?  ?Acute kidney injury- resolved ?Creatinine stable. His baseline appears to be <1. He appears euvolemic on  exam. ?-Daily BMP ?  ?COPD ?Home medications include Breo inhaler. He is not at the point he can tolerate inhaler. Wheezing present in upper and lower lungs bilaterally. Will switch from duonebs to incruse inhaler. ?-incruse inhaler ? ?Best Practice: ?Diet: heart healthy ?IVF: Fluids: none ?VTE: Lovenox per pharmacy for afib ?Code: Full ?AB: none ?Therapy Recs: SNF, lacks insurance, appreciate social works assistance with applying ?Family: brother ?DISPO: Anticipated discharge in 2-3 days  pending further evaluation of cardiomyopathy. ? ?Signature: ?Daleen Bo. Munir Victorian, D.O.  ?Internal Medicine Resident, PGY-1 ?[facility]  ?Pager: [phone] ?7:31 AM, 11/10/2021  ? ?Please contact the on call pager after 5 pm and on weekends at [phone].  ?

## 2021-11-10 NOTE — Progress Notes (Signed)
ANTICOAGULATION CONSULT NOTE ? ?Pharmacy Consult for Enoxaparin ?Indication: atrial fibrillation ? ?No Known Allergies ? ?Patient Measurements: ?Height: 6\' 1"  (185.4 cm) ?Weight: 91.8 kg (202 lb 6.1 oz) ?IBW/kg (Calculated) : 79.9 ? ?Vital Signs: ?Temp: 98.3 ?F (36.8 ?C) (04/26 1234) ?Temp Source: Oral (04/26 1234) ?BP: 128/89 (04/26 1234) ?Pulse Rate: 101 (04/26 1234) ? ?Labs: ?Recent Labs  ?  11/08/21 ?11/10/21 11/08/21 ?1833 11/08/21 ?2115 11/09/21 ?0104 11/10/21 ?0045  ?HGB 12.0*  --   --  11.9* 12.9*  ?HCT 35.2*  --   --  34.3* 39.2  ?PLT 221  --   --  259 315  ?CREATININE 1.04  --   --  1.04 0.98  ?TROPONINIHS  --  76* 79*  --   --   ? ? ? ?Estimated Creatinine Clearance: 87.2 mL/min (by C-G formula based on SCr of 0.98 mg/dL). ? ? ?Assessment: ?61 y.o. with a medical history significant for hypertension, emphysema, and prior tobacco use, who presented with fall and admitted for severe symptomatic hypercalcemia now found to be in atrial fibrillation. Pharmacy consulted on 11/05/21 to manage enoxaparin for Afib .  We started on Enoxaparin 100 mg SQ twice daily. ? ?Now plan is for cardiac cath ( RHC/LHC) tomorrow 11/11/21.   Hgb stable 12s-11s, pltc wnl,   Scr wnl stable.  No bleeding noted.  I will hold Enoxaparin  after dose tonight 's  PM dose 4/26,  and resume with  PM dose 4/27 but will follow up post cath for anticoagulation plan.  ? ? ?Goal of Therapy:  ?Monitor platelets by anticoagulation protocol: Yes ?  ?Plan:  ?Enoxaparin 100mg  SQ twice daily Hold after dose given tonight 4/26 then resume post cath in PM 4/27 at 95 mg SQ twice daily (weight 91.8 kg)  ?F/u transition to DOAC when able to take PO ?Continue to monitor H&H and platelets ? ? ? ?Thank you for allowing pharmacy to be a part of this patient?s care. ? ?5/26, PharmD ?Clinical Pharmacist ? ? ?

## 2021-11-10 NOTE — Progress Notes (Signed)
Physical Therapy Treatment ?Patient Details ?Name: Luis Marquez ?MRN: [id number] ?DOB: [birth year] ?Today's Date: 11/10/2021 ? ? ?History of Present Illness 61 y/o male presented to ED on 11/02/21 after fall in parking lot and hitting R hand with urinary incontinence. CT head negative. Admitted for severe electrolyte imbalance, AKI, and ETOH withdrawal. PMH: HTN, alcohol use disorder ? ?  ?PT Comments  ? ? Pt making good progress today.  Able to transfer OOB with mod A of 2, multimodal cues, and increased time.  Pt on 2 L O2 with VSS.  Pt remains confused, mumbling, and requiring increased cues.  Did increase frequency to 3xweek as pt with no insurance and may be difficult to place.  ?   ?Recommendations for follow up therapy are one component of a multi-disciplinary discharge planning process, led by the attending physician.  Recommendations may be updated based on patient status, additional functional criteria and insurance authorization. ? ?Follow Up Recommendations ? Skilled nursing-short term rehab (<3 hours/day) ?  ?  ?Assistance Recommended at Discharge Frequent or constant Supervision/Assistance  ?Patient can return home with the following Two people to help with walking and/or transfers;A lot of help with bathing/dressing/bathroom;Assistance with cooking/housework;Direct supervision/assist for medications management;Direct supervision/assist for financial management;Assist for transportation;Help with stairs or ramp for entrance ?  ?Equipment Recommendations ? Rolling walker (2 wheels);Wheelchair cushion (measurements PT);Wheelchair (measurements PT)  ?  ?Recommendations for Other Services   ? ? ?  ?Precautions / Restrictions Precautions ?Precautions: Fall ?Precaution Comments: CIWA, R hand laceration  ?  ? ?Mobility ? Bed Mobility ?Overal bed mobility: Needs Assistance ?  ?  ?  ?Supine to sit: Mod assist ?  ?  ?General bed mobility comments: Increased time and cues ?  ? ?Transfers ?Overall transfer level: Needs  assistance ?Equipment used: Rolling walker (2 wheels) ?Transfers: Sit to/from Stand, Bed to chair/wheelchair/BSC ?Sit to Stand: Mod assist, +2 physical assistance ?  ?Step pivot transfers: Mod assist, +2 physical assistance ?  ?  ?  ?General transfer comment: Increased time and multimodal cues.  Stood x 3 during session.  With transfers requiring assist for RW and balance. ?  ? ?Ambulation/Gait ?Ambulation/Gait assistance: Mod assist, +2 physical assistance ?Gait Distance (Feet): 3 Feet (3'x2) ?Assistive device: Rolling walker (2 wheels) ?Gait Pattern/deviations: Step-to pattern, Decreased stride length, Antalgic, Decreased dorsiflexion - left, Decreased dorsiflexion - right, Shuffle ?Gait velocity: decreased ?  ?  ?General Gait Details: Knees flexing but not buckling, having difficulty with dorsiflexion on both sides, very small steps, mod A for balance and RW management ? ? ?Stairs ?  ?  ?  ?  ?  ? ? ?Wheelchair Mobility ?  ? ?Modified Rankin (Stroke Patients Only) ?  ? ? ?  ?Balance Overall balance assessment: Needs assistance ?Sitting-balance support: Bilateral upper extremity supported ?Sitting balance-Leahy Scale: Poor ?Sitting balance - Comments: Sitting with bil UE support and supervision ?  ?Standing balance support: Bilateral upper extremity supported, Reliant on assistive device for balance ?Standing balance-Leahy Scale: Poor ?Standing balance comment: Requiring RW and min A static, mod A tx ?  ?  ?  ?  ?  ?  ?  ?  ?  ?  ?  ?  ? ?  ?Cognition Arousal/Alertness: Awake/alert ?Behavior During Therapy: Agitated ?Overall Cognitive Status: Impaired/Different from baseline ?Area of Impairment: Orientation, Problem solving, Attention, Memory, Following commands, Awareness ?  ?  ?  ?  ?  ?  ?  ?  ?Orientation Level: Disoriented to, Place, Time, Situation ?  Current Attention Level: Sustained ?  ?Following Commands: Follows one step commands inconsistently ?  ?Awareness: Intellectual ?Problem Solving: Slow  processing, Decreased initiation, Difficulty sequencing, Requires verbal cues, Requires tactile cues ?General Comments: Pt requiring increased time and cues for all.  He initially agreed to PT but at some point pt seemed aggravated with therapist saying "your messing me up, I'm supposed to be doing therapy."  Acted ok with PT tech.  After session, nurse spoke with pt and he reports he thought therapist was an ex-girlfriend. ?  ?  ? ?  ?Exercises   ? ?  ?General Comments General comments (skin integrity, edema, etc.): VSS on 2 L O2 ?  ?  ? ?Pertinent Vitals/Pain Pain Assessment ?Pain Assessment: No/denies pain  ? ? ?Home Living   ?  ?  ?  ?  ?  ?  ?  ?  ?  ?   ?  ?Prior Function    ?  ?  ?   ? ?PT Goals (current goals can now be found in the care plan section) Progress towards PT goals: Progressing toward goals ? ?  ?Frequency ? ? ? Min 3X/week ? ? ? ?  ?PT Plan Frequency needs to be updated  ? ? ?Co-evaluation   ?  ?  ?  ?  ? ?  ?AM-PAC PT "6 Clicks" Mobility   ?Outcome Measure ? Help needed turning from your back to your side while in a flat bed without using bedrails?: A Lot ?Help needed moving from lying on your back to sitting on the side of a flat bed without using bedrails?: A Lot ?Help needed moving to and from a bed to a chair (including a wheelchair)?: Total ?Help needed standing up from a chair using your arms (e.g., wheelchair or bedside chair)?: Total ?Help needed to walk in hospital room?: Total ?Help needed climbing 3-5 steps with a railing? : Total ?6 Click Score: 8 ? ?  ?End of Session Equipment Utilized During Treatment: Oxygen ?Activity Tolerance: Patient tolerated treatment well ?Patient left: with chair alarm set;in chair;with call bell/phone within reach ?Nurse Communication: Mobility status ?PT Visit Diagnosis: Muscle weakness (generalized) (M62.81);Other abnormalities of gait and mobility (R26.89) ?  ? ? ?Time: [id number] ?PT Time Calculation (min) (ACUTE ONLY): 23 min ? ?Charges:  $Gait  Training: 8-22 mins ?$Therapeutic Activity: 8-22 mins          ?          ? ?Abran Richard, PT ?Acute Rehab Services ?Pager [phone] ?[facility] [phone] ? ? ? ?Mikael Spray Beronica [place] ?11/10/2021, 1:47 PM ? ?

## 2021-11-10 NOTE — Progress Notes (Addendum)
? ? Advanced Heart Failure Rounding Note ? ?PCP-Cardiologist: None  ? ?Subjective:   ?Yesterday entresto/spiro started.  ? ?Denies chest pain. Told me about previous rehab stay for ETOH abuse. Wants help to stop drinking.  ? ?Objective:   ?Weight Range: ?91.8 kg ?Body mass index is 26.7 kg/m?.  ? ?Vital Signs:   ?Temp:  [97.6 ?F (36.4 ?C)-98.2 ?F (36.8 ?C)] 98 ?F (36.7 ?C) (04/26 0400) ?Pulse Rate:  [86-102] 100 (04/26 0400) ?Resp:  [20-29] 20 (04/26 0500) ?BP: (102-142)/(84-109) 130/97 (04/26 0400) ?SpO2:  [90 %-98 %] 91 % (04/26 0910) ?Weight:  [91.8 kg] 91.8 kg (04/26 0500) ?Last BM Date : 11/08/21 ? ?Weight change: ?Filed Weights  ? 11/08/21 0105 11/09/21 0500 11/10/21 0500  ?Weight: 95.8 kg 95.6 kg 91.8 kg  ? ? ?Intake/Output:  ? ?Intake/Output Summary (Last 24 hours) at 11/10/2021 1051 ?Last data filed at 11/10/2021 0600 ?Gross per 24 hour  ?Intake 470 ml  ?Output 700 ml  ?Net -230 ml  ?  ? ? ?Physical Exam  ?General:  Sitting on the side of the bed.  No resp difficulty ?HEENT: normal ?Neck: supple. no JVD. Carotids 2+ bilat; no bruits. No lymphadenopathy or thryomegaly appreciated. ?Cor: PMI nondisplaced. Regular rate & rhythm. No rubs, gallops or murmurs. ?Lungs: clear ?Abdomen: soft, nontender, nondistended. No hepatosplenomegaly. No bruits or masses. Good bowel sounds. ?Extremities: no cyanosis, clubbing, rash, edema ?Neuro: alert & orientedx3, cranial nerves grossly intact. moves all 4 extremities w/o difficulty. Affect pleasant ? ? ?Telemetry  ? ?SR-ST 80-100s ?Labs  ?  ?CBC ?Recent Labs  ?  11/09/21 ?0104 11/10/21 ?0045  ?WBC 7.0 9.1  ?HGB 11.9* 12.9*  ?HCT 34.3* 39.2  ?MCV 100.0 101.8*  ?PLT 259 315  ? ?Basic Metabolic Panel ?Recent Labs  ?  11/09/21 ?0104 11/10/21 ?0045  ?NA 145 147*  ?K 2.8* 3.2*  ?CL 106 110  ?CO2 31 25  ?GLUCOSE 95 77  ?BUN 18 16  ?CREATININE 1.04 0.98  ?CALCIUM 7.4* 7.4*  ?MG 1.7 1.7  ?PHOS 2.0*  --   ? ?Liver Function Tests ?No results for input(s): AST, ALT, ALKPHOS, BILITOT,  PROT, ALBUMIN in the last 72 hours. ? ?No results for input(s): LIPASE, AMYLASE in the last 72 hours. ?Cardiac Enzymes ?No results for input(s): CKTOTAL, CKMB, CKMBINDEX, TROPONINI in the last 72 hours. ? ?BNP: ?BNP (last 3 results) ?No results for input(s): BNP in the last 8760 hours. ? ?ProBNP (last 3 results) ?No results for input(s): PROBNP in the last 8760 hours. ? ? ?D-Dimer ?No results for input(s): DDIMER in the last 72 hours. ?Hemoglobin A1C ?No results for input(s): HGBA1C in the last 72 hours. ?Fasting Lipid Panel ?No results for input(s): CHOL, HDL, LDLCALC, TRIG, CHOLHDL, LDLDIRECT in the last 72 hours. ?Thyroid Function Tests ?No results for input(s): TSH, T4TOTAL, T3FREE, THYROIDAB in the last 72 hours. ? ?Invalid input(s): FREET3 ? ?Other results: ? ? ?Imaging  ? ? ?No results found. ? ? ?Medications:   ? ? ?Scheduled Medications: ? amiodarone  200 mg Oral BID  ? enoxaparin (LOVENOX) injection  100 mg Subcutaneous BID  ? folic acid  1 mg Oral Daily  ? mouth rinse  15 mL Mouth Rinse BID  ? multivitamin with minerals  1 tablet Oral Daily  ? potassium chloride  40 mEq Oral BID  ? sacubitril-valsartan  1 tablet Oral BID  ? spironolactone  25 mg Oral Daily  ? thiamine  100 mg Oral Daily  ? Or  ?  thiamine  100 mg Intravenous Daily  ? umeclidinium bromide  1 puff Inhalation Daily  ? ? ?Infusions: ? ? ? ?PRN Medications: ?acetaminophen, albuterol ? ? ? ?Patient Profile  ? ?Luis Marquez is a  61 year old with HTN, emphysema, ETOH abuse,and tobacco abuse.  ?  ?Admitted with symptomatic hypercalcemia. ? ?Assessment/Plan  ? ?1. Hypercalcemia  ?- Treatment and workup per primary team.  ?- Calcium on admit 14.5 . Calcium now down to 7.4 ?  ?2. Delirium  ?- ETOH withdrawal. On CIWA protocol.  ?- Resolving. A&Ox3.  ?  ?3. A Fib RVR ?- Developed A fib RVR 11/03/21. Converted on amio drip.  ?- Went back in NSR on 4/22. Amio drip stopped 04/24. ?- Maintaining SR. Continue amiodarone 200 mg BID. SR today. ?- Replace Mag  and K aggressively. ?- Anticoagulated with Lovenox ?  ?4.  Acute HFrEF  ?- New reduced EF. Unclear etiology. No previous history of coronary disease.  ?- Echo EF 25%, mid and apical hypokinesis with sparing of base, RV mildly reduced. Echo concerning for Takotsubo.  ETOH abuse/HTN  maybe contributing.  ?- Set RHC/LHC tomorrow. The patient understands that risks included but are not limited to stroke (1 in 1000), death (1 in 46), kidney failure [usually temporary] (1 in 500), bleeding (1 in 200), allergic reaction [possibly serious] (1 in 200).  The patient understands and agrees to proceed.  ?- Appears euvolemic.  ?- Increase entresto 97-103 mg twice a day.  ?- Hold off on beta blocker for now ?- Continue  spiro to 25 mg daily ?- Consider SGLT2i in future, worry a bit about hygiene/risk of UTI ?- Daily BMET ? ?5. Hypomagnesia ?-Mag 1.7.  ?- Give 2 grams Mag.  ? ?6. Hypokalemia ?- Supp K.  ? ?7. ETOH abuse ?- In Rehab 10 years ago for ETOH abuse.  ?-Drinks 20 beers every 2 days ?-Withdrawal resolving.  ? ?SDOH: ?-Uninsured. [place] on board ?-SNF recommended at discharge. May not be able to return home safely. ? ?Set up RHC/LHC tomorrow. If coronaries ok will need CMRI.  ? ?Length of Stay: 8 ? ?Darrick Grinder, NP  ?11/10/2021, 10:51 AM ? ?Advanced Heart Failure Team ?Pager [phone] (M-F; 7a - 5p)  ?Please contact [facility] for night-coverage after hours (5p -7a ) and weekends on [email or web address] ? ?Patient seen and examined with the above-signed Advanced Practice Provider and/or Housestaff. I personally reviewed laboratory data, imaging studies and relevant notes. I independently examined the patient and formulated the important aspects of the plan. I have edited the note to reflect any of my changes or salient points. I have personally discussed the plan with the patient and/or family. ? ?Less confused today. Denies CP or SOB. No WD symptoms. Electrolytes improved. Deneis CP or SOB. No recurrent AF.  ? ?General:  Sitting  up No resp difficulty ?HEENT: normal ?Neck: supple. no JVD. Carotids 2+ bilat; no bruits. No lymphadenopathy or thryomegaly appreciated. ?Cor: PMI nondisplaced. Regular rate & rhythm. No rubs, gallops or murmurs. ?Lungs: clear ?Abdomen: soft, nontender, nondistended. No hepatosplenomegaly. No bruits or masses. Good bowel sounds. ?Extremities: no cyanosis, clubbing, rash, edema ?Neuro: alert & orientedx3, cranial nerves grossly intact. moves all 4 extremities w/o difficulty. Affect pleasant ? ?Agree with increasing Entresto. Plan R/L cath tomorrow. Supp Mag. If cath negative consider cMRI. ? ?Glori Bickers, MD  ?5:18 PM ?  ? ?

## 2021-11-11 ENCOUNTER — Encounter (HOSPITAL_COMMUNITY)
Admission: EM | Disposition: A | Discharge status: Home / Self Care | Payer: Self-pay | Source: Home / Self Care | Attending: Student in an Organized Health Care Education/Training Program

## 2021-11-11 ENCOUNTER — Encounter (HOSPITAL_COMMUNITY): Payer: Self-pay | Admitting: Student in an Organized Health Care Education/Training Program

## 2021-11-11 DIAGNOSIS — I2510 Atherosclerotic heart disease of native coronary artery without angina pectoris: Secondary | ICD-10-CM

## 2021-11-11 HISTORY — PX: RIGHT/LEFT HEART CATH AND CORONARY ANGIOGRAPHY: CATH118266

## 2021-11-11 LAB — POCT I-STAT EG7
Acid-Base Excess: 0 mmol/L (ref 0.0–2.0)
Acid-Base Excess: 3 mmol/L — ABNORMAL HIGH (ref 0.0–2.0)
Acid-base deficit: 1 mmol/L (ref 0.0–2.0)
Bicarbonate: 23.8 mmol/L (ref 20.0–28.0)
Bicarbonate: 25.6 mmol/L (ref 20.0–28.0)
Bicarbonate: 27.8 mmol/L (ref 20.0–28.0)
Calcium, Ion: 0.69 mmol/L — CL (ref 1.15–1.40)
Calcium, Ion: 0.81 mmol/L — CL (ref 1.15–1.40)
Calcium, Ion: 1.01 mmol/L — ABNORMAL LOW (ref 1.15–1.40)
HCT: 26 % — ABNORMAL LOW (ref 39.0–52.0)
HCT: 26 % — ABNORMAL LOW (ref 39.0–52.0)
HCT: 30 % — ABNORMAL LOW (ref 39.0–52.0)
Hemoglobin: 10.2 g/dL — ABNORMAL LOW (ref 13.0–17.0)
Hemoglobin: 8.8 g/dL — ABNORMAL LOW (ref 13.0–17.0)
Hemoglobin: 8.8 g/dL — ABNORMAL LOW (ref 13.0–17.0)
O2 Saturation: 72 %
O2 Saturation: 73 %
O2 Saturation: 74 %
Potassium: 2.5 mmol/L — CL (ref 3.5–5.1)
Potassium: 2.7 mmol/L — CL (ref 3.5–5.1)
Potassium: 3.3 mmol/L — ABNORMAL LOW (ref 3.5–5.1)
Sodium: 148 mmol/L — ABNORMAL HIGH (ref 135–145)
Sodium: 150 mmol/L — ABNORMAL HIGH (ref 135–145)
Sodium: 151 mmol/L — ABNORMAL HIGH (ref 135–145)
TCO2: 25 mmol/L (ref 22–32)
TCO2: 27 mmol/L (ref 22–32)
TCO2: 29 mmol/L (ref 22–32)
pCO2, Ven: 38.7 mmHg — ABNORMAL LOW (ref 44–60)
pCO2, Ven: 41.5 mmHg — ABNORMAL LOW (ref 44–60)
pCO2, Ven: 42.4 mmHg — ABNORMAL LOW (ref 44–60)
pH, Ven: 7.389 (ref 7.25–7.43)
pH, Ven: 7.396 (ref 7.25–7.43)
pH, Ven: 7.434 — ABNORMAL HIGH (ref 7.25–7.43)
pO2, Ven: 38 mmHg (ref 32–45)
pO2, Ven: 38 mmHg (ref 32–45)
pO2, Ven: 39 mmHg (ref 32–45)

## 2021-11-11 LAB — CBC
HCT: 34.3 % — ABNORMAL LOW (ref 39.0–52.0)
Hemoglobin: 11.9 g/dL — ABNORMAL LOW (ref 13.0–17.0)
MCH: 34.7 pg — ABNORMAL HIGH (ref 26.0–34.0)
MCHC: 34.7 g/dL (ref 30.0–36.0)
MCV: 100 fL (ref 80.0–100.0)
Platelets: 279 10*3/uL (ref 150–400)
RBC: 3.43 MIL/uL — ABNORMAL LOW (ref 4.22–5.81)
RDW: 13.6 % (ref 11.5–15.5)
WBC: 8.3 10*3/uL (ref 4.0–10.5)
nRBC: 0 % (ref 0.0–0.2)

## 2021-11-11 LAB — MAGNESIUM: Magnesium: 1.5 mg/dL — ABNORMAL LOW (ref 1.7–2.4)

## 2021-11-11 LAB — BASIC METABOLIC PANEL
Anion gap: 5 (ref 5–15)
BUN: 14 mg/dL (ref 8–23)
CO2: 30 mmol/L (ref 22–32)
Calcium: 7.2 mg/dL — ABNORMAL LOW (ref 8.9–10.3)
Chloride: 112 mmol/L — ABNORMAL HIGH (ref 98–111)
Creatinine, Ser: 0.89 mg/dL (ref 0.61–1.24)
GFR, Estimated: >60 mL/min (ref >60)
Glucose, Bld: 110 mg/dL — ABNORMAL HIGH (ref 70–99)
Potassium: 3 mmol/L — ABNORMAL LOW (ref 3.5–5.1)
Sodium: 147 mmol/L — ABNORMAL HIGH (ref 135–145)

## 2021-11-11 LAB — POCT I-STAT 7, (LYTES, BLD GAS, ICA,H+H)
Acid-Base Excess: 1 mmol/L (ref 0.0–2.0)
Bicarbonate: 25.1 mmol/L (ref 20.0–28.0)
Calcium, Ion: 0.81 mmol/L — CL (ref 1.15–1.40)
HCT: 27 % — ABNORMAL LOW (ref 39.0–52.0)
Hemoglobin: 9.2 g/dL — ABNORMAL LOW (ref 13.0–17.0)
O2 Saturation: 94 %
Potassium: 2.9 mmol/L — ABNORMAL LOW (ref 3.5–5.1)
Sodium: 151 mmol/L — ABNORMAL HIGH (ref 135–145)
TCO2: 26 mmol/L (ref 22–32)
pCO2 arterial: 36.5 mmHg (ref 32–48)
pH, Arterial: 7.446 (ref 7.35–7.45)
pO2, Arterial: 67 mmHg — ABNORMAL LOW (ref 83–108)

## 2021-11-11 LAB — PHOSPHORUS: Phosphorus: 1.5 mg/dL — ABNORMAL LOW (ref 2.5–4.6)

## 2021-11-11 SURGERY — RIGHT/LEFT HEART CATH AND CORONARY ANGIOGRAPHY
Anesthesia: LOCAL

## 2021-11-11 MED ORDER — HEPARIN (PORCINE) IN NACL 1000-0.9 UT/500ML-% IV SOLN
INTRAVENOUS | Status: DC | PRN
  Administered 2021-11-11 (×2): 500 mL

## 2021-11-11 MED ORDER — SACUBITRIL-VALSARTAN 97-103 MG PO TABS
1.0000 | ORAL_TABLET | Freq: Two times a day (BID) | ORAL | Status: DC
  Filled 2021-11-11: qty 1

## 2021-11-11 MED ORDER — LIDOCAINE HCL (PF) 1 % IJ SOLN
INTRAMUSCULAR | Status: AC
  Filled 2021-11-11: qty 30

## 2021-11-11 MED ORDER — APIXABAN 5 MG PO TABS
5.0000 mg | ORAL_TABLET | Freq: Two times a day (BID) | ORAL | Status: DC
  Administered 2021-11-11 – 2021-11-16 (×10): 5 mg via ORAL
  Filled 2021-11-11 (×10): qty 1

## 2021-11-11 MED ORDER — LIDOCAINE HCL (PF) 1 % IJ SOLN
INTRAMUSCULAR | Status: DC | PRN
  Administered 2021-11-11: 5 mL

## 2021-11-11 MED ORDER — SACUBITRIL-VALSARTAN 97-103 MG PO TABS
1.0000 | ORAL_TABLET | Freq: Two times a day (BID) | ORAL | Status: DC
  Administered 2021-11-11 – 2021-11-16 (×10): 1 via ORAL
  Filled 2021-11-11 (×11): qty 1

## 2021-11-11 MED ORDER — MAGNESIUM SULFATE 4 GM/100ML IV SOLN
4.0000 g | Freq: Once | INTRAVENOUS | Status: AC
  Administered 2021-11-11: 4 g via INTRAVENOUS
  Filled 2021-11-11 (×2): qty 100

## 2021-11-11 MED ORDER — ONDANSETRON HCL 4 MG/2ML IJ SOLN: 4.0000 mg | Freq: Four times a day (QID) | INTRAMUSCULAR | Status: DC | PRN

## 2021-11-11 MED ORDER — SODIUM CHLORIDE 0.9% FLUSH: 3.0000 mL | INTRAVENOUS | Status: DC | PRN

## 2021-11-11 MED ORDER — VERAPAMIL HCL 2.5 MG/ML IV SOLN
INTRAVENOUS | Status: AC
  Filled 2021-11-11: qty 2

## 2021-11-11 MED ORDER — MIDAZOLAM HCL 2 MG/2ML IJ SOLN
INTRAMUSCULAR | Status: AC
Start: 2021-11-11 — End: ?
  Filled 2021-11-11: qty 2

## 2021-11-11 MED ORDER — FENTANYL CITRATE (PF) 100 MCG/2ML IJ SOLN
INTRAMUSCULAR | Status: AC
  Filled 2021-11-11: qty 2

## 2021-11-11 MED ORDER — POTASSIUM CHLORIDE 20 MEQ PO PACK: 20.0000 meq | PACK | Freq: Two times a day (BID) | ORAL | Status: DC

## 2021-11-11 MED ORDER — SODIUM CHLORIDE 0.9 % IV SOLN
250.0000 mL | INTRAVENOUS | Status: DC | PRN
  Administered 2021-11-12: 250 mL via INTRAVENOUS

## 2021-11-11 MED ORDER — HEPARIN (PORCINE) IN NACL 2-0.9 UNITS/ML
INTRAMUSCULAR | Status: DC | PRN
  Administered 2021-11-11: 10 mL via INTRA_ARTERIAL

## 2021-11-11 MED ORDER — LABETALOL HCL 5 MG/ML IV SOLN: 10.0000 mg | INTRAVENOUS | Status: AC | PRN

## 2021-11-11 MED ORDER — POTASSIUM CHLORIDE 10 MEQ/100ML IV SOLN
10.0000 meq | INTRAVENOUS | Status: AC
  Administered 2021-11-11 (×2): 10 meq via INTRAVENOUS
  Filled 2021-11-11 (×2): qty 100

## 2021-11-11 MED ORDER — POTASSIUM PHOSPHATES 15 MMOLE/5ML IV SOLN
30.0000 mmol | Freq: Once | INTRAVENOUS | Status: AC
  Administered 2021-11-11: 30 mmol via INTRAVENOUS
  Filled 2021-11-11: qty 10

## 2021-11-11 MED ORDER — HEPARIN SODIUM (PORCINE) 1000 UNIT/ML IJ SOLN
INTRAMUSCULAR | Status: DC | PRN
  Administered 2021-11-11: 5000 [IU] via INTRAVENOUS

## 2021-11-11 MED ORDER — IOHEXOL 350 MG/ML SOLN
INTRAVENOUS | Status: DC | PRN
  Administered 2021-11-11: 30 mL

## 2021-11-11 MED ORDER — HEPARIN SODIUM (PORCINE) 1000 UNIT/ML IJ SOLN
INTRAMUSCULAR | Status: AC
  Filled 2021-11-11: qty 10

## 2021-11-11 MED ORDER — HYDRALAZINE HCL 20 MG/ML IJ SOLN: 10.0000 mg | INTRAMUSCULAR | Status: AC | PRN

## 2021-11-11 MED ORDER — SODIUM CHLORIDE 0.9% FLUSH
3.0000 mL | Freq: Two times a day (BID) | INTRAVENOUS | Status: DC
  Administered 2021-11-12 – 2021-11-15 (×8): 3 mL via INTRAVENOUS

## 2021-11-11 MED ORDER — POTASSIUM CHLORIDE 10 MEQ/100ML IV SOLN: 10.0000 meq | INTRAVENOUS | Status: DC

## 2021-11-11 MED ORDER — HEPARIN (PORCINE) IN NACL 1000-0.9 UT/500ML-% IV SOLN
INTRAVENOUS | Status: AC
  Filled 2021-11-11: qty 1000

## 2021-11-11 MED ORDER — SACUBITRIL-VALSARTAN 97-103 MG PO TABS: 1.0000 | ORAL_TABLET | Freq: Two times a day (BID) | ORAL | Status: DC

## 2021-11-11 MED ORDER — APIXABAN 5 MG PO TABS: 5.0000 mg | ORAL_TABLET | Freq: Two times a day (BID) | ORAL | Status: DC

## 2021-11-11 MED ORDER — POTASSIUM CHLORIDE 20 MEQ PO PACK
40.0000 meq | PACK | Freq: Once | ORAL | Status: AC
  Administered 2021-11-11: 40 meq via ORAL
  Filled 2021-11-11: qty 2

## 2021-11-11 MED ORDER — ACETAMINOPHEN 325 MG PO TABS: 650.0000 mg | ORAL_TABLET | ORAL | Status: DC | PRN

## 2021-11-11 SURGICAL SUPPLY — 9 items
CATH 5FR JL3.5 JR4 ANG PIG MP (CATHETERS) ×1 IMPLANT
CATH BALLN WEDGE 5F 110CM (CATHETERS) ×1 IMPLANT
DEVICE RAD COMP TR BAND LRG (VASCULAR PRODUCTS) ×1 IMPLANT
GLIDESHEATH SLEND SS 6F .021 (SHEATH) ×1 IMPLANT
GUIDEWIRE INQWIRE 1.5J.035X260 (WIRE) IMPLANT
INQWIRE 1.5J .035X260CM (WIRE) ×2
PACK CARDIAC CATHETERIZATION (CUSTOM PROCEDURE TRAY) ×2 IMPLANT
SHEATH GLIDE SLENDER 4/5FR (SHEATH) ×1 IMPLANT
TRANSDUCER W/STOPCOCK (MISCELLANEOUS) ×2 IMPLANT

## 2021-11-11 NOTE — Progress Notes (Signed)
Luis Dike, RN cath lab states pt in need of 20 G IV in right ac. Pt has 20 G in rt forearm and 22 in lt forearm. Cath lab RN stated that she will place an IV consult for pt to get  needed access. ?

## 2021-11-11 NOTE — Progress Notes (Addendum)
? ?HD#9 ?SUBJECTIVE:  ?Patient Summary:  ?Luis Marquez is a 61 y.o. with a pertinent PMH of emphysema, and prior tobacco use, who presented with fall and admitted for severe symptomatic high hypercalcemia, who went through alcohol withdraw developed atrial fibrillation with RVR and then was found to have heart failure with reduced ejection fraction. His mentation has improved and he is undergoing right and left heart catheretization today. ? ?Overnight Events: none ? ?Interim History: Patient assessed at bedside this AM.  He has been anxious for [place] to come by today, but is not able to say what in particular made him anxious.  He states that he has been in the hospital for 2.5 weeks. We talked about the procedure he is undergoing today for his heart.  He states that the vessels need to be looked at and that is why the procedure is happening.  He is states that he feels better today.  No other concerns at this time. ? ? ?OBJECTIVE:  ?Vital Signs: ?Vitals:  ? 11/11/21 0215 11/11/21 0400 11/11/21 0816 11/11/21 0830  ?BP:  92/68  129/81  ?Pulse:  77 83 82  ?Resp:    20  ?Temp:    97.8 ?F (36.6 ?C)  ?TempSrc:    Oral  ?SpO2:   92% 92%  ?Weight: 94.7 kg     ?Height:      ? ?Supplemental O2: Nasal Cannula ?SpO2: 92 % ?O2 Flow Rate (L/min): 3 L/min ? ?Filed Weights  ? 11/10/21 0500 11/10/21 2204 11/11/21 0215  ?Weight: 91.8 kg 94.7 kg 94.7 kg  ? ? ? ?Intake/Output Summary (Last 24 hours) at 11/11/2021 0849 ?Last data filed at 11/11/2021 0600 ?Gross per 24 hour  ?Intake 100 ml  ?Output 350 ml  ?Net -250 ml  ? ? ?Net IO Since Admission: -3,081.77 mL [11/11/21 0849] ? ?Physical Exam: ?Constitutional: Sitting up in bed, alert ?HENT: normocephalic, healing abrasion on right temple, mucous membranes moist ?Eyes: conjunctiva non-erythematous ?Neck: supple ?Cardiovascular: Regular rate and rhythm, no m/r/g, no JVD ?Pulmonary/Chest: Normal work of breathing on room air, lungs clear to auscultation bilaterally ?Abdominal: soft,  non-tender, non-distended ?MSK: no peripheral edema present to lower extremities bilaterally ?Skin: warm and dry ?Psych: endorses anxiety  ? ?Patient Lines/Drains/Airways Status   ? ? Active Line/Drains/Airways   ? ? Name Placement date Placement time Site Days  ? Peripheral IV 11/02/21 20 G Right Antecubital 11/02/21  1511  Antecubital  1  ? External Urinary Catheter 11/03/21  0340  --  less than 1  ? Incision (Closed) 11/11/14 Hip Right 11/11/14  2212  -- 2549  ? ?  ?  ? ?  ? ? ?Pertinent Labs: ? ?  Latest Ref Rng & Units 11/11/2021  ?  1:20 AM 11/10/2021  ? 12:45 AM 11/09/2021  ?  1:04 AM  ?CBC  ?WBC 4.0 - 10.5 K/uL 8.3   9.1   7.0    ?Hemoglobin 13.0 - 17.0 g/dL 11.9   12.9   11.9    ?Hematocrit 39.0 - 52.0 % 34.3   39.2   34.3    ?Platelets 150 - 400 K/uL 279   315   259    ? ? ? ?  Latest Ref Rng & Units 11/11/2021  ?  1:20 AM 11/10/2021  ? 12:45 AM 11/09/2021  ?  1:04 AM  ?CMP  ?Glucose 70 - 99 mg/dL 110   77   95    ?BUN 8 - 23 mg/dL 14  16   18    ?Creatinine 0.61 - 1.24 mg/dL 0.89   0.98   1.04    ?Sodium 135 - 145 mmol/L 147   147   145    ?Potassium 3.5 - 5.1 mmol/L 3.0   3.2   2.8    ?Chloride 98 - 111 mmol/L 112   110   106    ?CO2 22 - 32 mmol/L _0 ?Calcium 8.9 - 10.3 mg/dL 7.2   7.4   7.4    ? ? ?No results for input(s): GLUCAP in the last 72 hours. ?  ? ?Pertinent Imaging: ?No results found. ? ?ASSESSMENT/PLAN:  ?Assessment: ?Principal Problem: ?  Hypercalcemia ?Active Problems: ?  Delirium tremens ([place]) ?  Hypokalemia ?  Solitary pulmonary nodule ?  Hand laceration ?  Essential hypertension ?  Alcohol withdrawal ([place]) ?  AKI (acute kidney injury) ([place]) ?  Atrial fibrillation with RVR ([place]) ?  Acute HFrEF (heart failure with reduced ejection fraction) ([place]) ? ? ?Luis Marquez is a 61 y.o. with a pertinent PMH of emphysema, and prior tobacco use, who presented with fall and admitted for severe symptomatic high hypercalcemia, who went through alcohol withdraw developed atrial fibrillation with  RVR and then was found to have heart failure with reduced ejection fraction. His mentation has improved and he is undergoing right and left heart catheretization today. ? ?Plan: ?Acute heart failure with reduced ejection fraction ?Mentation has improved and patient is scheduled to under go heart catheretization with Dr. Haroldine Laws at noon. We are working to replete electrolytes this morning. Appreciate cardiology teams assistance in this case.  If ischemic evaluation is negative then he will need a cardiac MRI to . GDMT is being titrated as tolerated.  Holding off on beta-blockade at this time until further evaluation of right-sided heart function. ?- ischemic evaluation with cardiology once mentation improves ?- Entresto 97-103 mg BID ?- spironolactone 25 mg qd ?- appreciate social work assistance with applying for insurance ? ?Atrial fibrillation with RVR-resolved ?Heart rates in the 90s to 110s.  Remains on p.o. amiodarone. ?- Continue amiodarone 200 mg ?- Lovenox at anticoagulation dose ? ?Alcohol withdraw ?Metabolic encephalopathy ?Mentation is improving. ?-CiWA q 6 hrs ? ?Symptomatic hypercalcemia- resolved ?Hypokalemia ?Hypochloridemia ?Elevated bicarb ?Chloride depleted metabolic alkalosis ?He was given 1 dose of zoledronic acid on 4/18 and two doses of calcitonin. Electrolytes have improved. Unclear cause of hypercalcemia.  PTH was low at 8.  Thyroid disease, granuloma disease have been ruled out. Differentials include paraneoplastic syndrome due to cancer vs lytic bone disease vs multiple myeloma (lights chain ratio wnl). PTHrP was less 2. He will need further evaluation as outpatient with PET scan for lytic lesions causing hypercalcemia. ?-PET scan as outpatient for the pulmonary nodule ?-Daily BMP ? ? ?Best Practice: ?Diet: heart healthy ?IVF: Fluids: none ?VTE: Lovenox per pharmacy for afib ?Code: Full ?AB: none ?Therapy Recs: SNF, lacks insurance, appreciate social works assistance with  applying ?Family: brother, will call to update after cath procedure  ?DISPO: Anticipated discharge in 2-3 days  pending further evaluation of cardiomyopathy. ? ?Signature: ?Daleen Bo. Khadim Lundberg, D.O.  ?Internal Medicine Resident, PGY-1 ?[facility]  ?Pager: [phone] ?8:49 AM, 11/11/2021  ? ?Please contact the on call pager after 5 pm and on weekends at [phone].  ?

## 2021-11-11 NOTE — H&P (View-Only) (Signed)
? ? Advanced Heart Failure Rounding Note ? ?PCP-Cardiologist: None  ? ?Subjective:   ? ?Feeling better today. Denies dyspnea. No CP.  ? ?Tolerating GDMT ok w/ stable BP. Renal function normal. NSR on amio.  ? ?Getting IV replacement of K and Mg  ?K 3.0  ?Mg 1.5  ? ?Na 147  ? ?Going for cath today.  ? ? ?Objective:   ?Weight Range: ?94.7 kg ?Body mass index is 27.54 kg/m?.  ? ?Vital Signs:   ?Temp:  [97.8 ?F (36.6 ?C)-98.3 ?F (36.8 ?C)] 97.8 ?F (36.6 ?C) (04/27 0830) ?Pulse Rate:  [76-104] 82 (04/27 0830) ?Resp:  [18-20] 20 (04/27 0830) ?BP: (92-129)/(65-89) 129/81 (04/27 0830) ?SpO2:  [92 %-93 %] 92 % (04/27 0830) ?Weight:  [94.7 kg] 94.7 kg (04/27 0215) ?Last BM Date : 11/08/21 ? ?Weight change: ?Filed Weights  ? 11/10/21 0500 11/10/21 2204 11/11/21 0215  ?Weight: 91.8 kg 94.7 kg 94.7 kg  ? ? ?Intake/Output:  ? ?Intake/Output Summary (Last 24 hours) at 11/11/2021 1105 ?Last data filed at 11/11/2021 0600 ?Gross per 24 hour  ?Intake 100 ml  ?Output 350 ml  ?Net -250 ml  ?  ? ? ?Physical Exam  ? ?General:  fatigued appearing. No respiratory difficulty ?HEENT: normal ?Neck: supple. JVD not elevated. Carotids 2+ bilat; no bruits. No lymphadenopathy or thyromegaly appreciated. ?Cor: PMI nondisplaced. Regular rate & rhythm. No rubs, gallops or murmurs. ?Lungs: clear ?Abdomen: soft, nontender, nondistended. No hepatosplenomegaly. No bruits or masses. Good bowel sounds. ?Extremities: no cyanosis, clubbing, rash, edema ?Neuro: alert & oriented x 3, cranial nerves grossly intact. moves all 4 extremities w/o difficulty. Affect pleasant ? ? ?Telemetry  ? ?NSR 70s ? ?Labs  ?  ?CBC ?Recent Labs  ?  11/10/21 ?0045 11/11/21 ?0120  ?WBC 9.1 8.3  ?HGB 12.9* 11.9*  ?HCT 39.2 34.3*  ?MCV 101.8* 100.0  ?PLT 315 279  ? ?Basic Metabolic Panel ?Recent Labs  ?  11/09/21 ?0104 11/10/21 ?0045 11/11/21 ?0120  ?NA 145 147* 147*  ?K 2.8* 3.2* 3.0*  ?CL 106 110 112*  ?CO2 31 25 30   ?GLUCOSE 95 77 110*  ?BUN 18 16 14   ?CREATININE 1.04 0.98 0.89   ?CALCIUM 7.4* 7.4* 7.2*  ?MG 1.7 1.7 1.5*  ?PHOS 2.0*  --  1.5*  ? ?Liver Function Tests ?No results for input(s): AST, ALT, ALKPHOS, BILITOT, PROT, ALBUMIN in the last 72 hours. ? ?No results for input(s): LIPASE, AMYLASE in the last 72 hours. ?Cardiac Enzymes ?No results for input(s): CKTOTAL, CKMB, CKMBINDEX, TROPONINI in the last 72 hours. ? ?BNP: ?BNP (last 3 results) ?No results for input(s): BNP in the last 8760 hours. ? ?ProBNP (last 3 results) ?No results for input(s): PROBNP in the last 8760 hours. ? ? ?D-Dimer ?No results for input(s): DDIMER in the last 72 hours. ?Hemoglobin A1C ?No results for input(s): HGBA1C in the last 72 hours. ?Fasting Lipid Panel ?No results for input(s): CHOL, HDL, LDLCALC, TRIG, CHOLHDL, LDLDIRECT in the last 72 hours. ?Thyroid Function Tests ?No results for input(s): TSH, T4TOTAL, T3FREE, THYROIDAB in the last 72 hours. ? ?Invalid input(s): FREET3 ? ?Other results: ? ? ?Imaging  ? ? ?No results found. ? ? ?Medications:   ? ? ?Scheduled Medications: ? amiodarone  200 mg Oral BID  ? enoxaparin (LOVENOX) injection  95 mg Subcutaneous [id number]  ? folic acid  1 mg Oral Daily  ? mouth rinse  15 mL Mouth Rinse BID  ? multivitamin with minerals  1 tablet Oral Daily  ? [  START ON 11/12/2021] sacubitril-valsartan  1 tablet Oral BID  ? sodium chloride flush  3 mL Intravenous Q12H  ? spironolactone  25 mg Oral Daily  ? thiamine  100 mg Oral Daily  ? Or  ? thiamine  100 mg Intravenous Daily  ? umeclidinium bromide  1 puff Inhalation Daily  ? ? ?Infusions: ? sodium chloride    ? sodium chloride 10 mL/hr at 11/11/21 [id number]  ? magnesium sulfate bolus IVPB 4 g (11/11/21 0945)  ? potassium chloride 10 mEq (11/11/21 1008)  ? potassium PHOSPHATE IVPB (in mmol)    ? ? ? ?PRN Medications: ?sodium chloride, acetaminophen, albuterol, sodium chloride flush ? ? ? ?Patient Profile  ? ?Luis Marquez is a  61 year old with HTN, emphysema, ETOH abuse,and tobacco abuse.  ?  ?Admitted with symptomatic  hypercalcemia. ? ?Assessment/Plan  ? ?1. Hypercalcemia  ?- Treatment and workup per primary team.  ?- Calcium on admit 14.5. Calcium now down to 7.2 ?  ?2. Delirium  ?- ETOH withdrawal. On CIWA protocol.  ?- Resolving. A&Ox3.  ?  ?3. A Fib RVR ?- Developed A fib RVR 11/03/21. Converted on amio drip.  ?- Went back in NSR on 4/22. Amio drip stopped 04/24. ?- Maintaining SR. Continue amiodarone 200 mg BID.  ?- Replace Mag and K aggressively. ?- Anticoagulated with Lovenox ?  ?4.  Acute HFrEF  ?- New reduced EF. Unclear etiology. No previous history of coronary disease.  ?- Echo EF 25%, mid and apical hypokinesis with sparing of base, RV mildly reduced. Echo concerning for Takotsubo.  ETOH abuse/HTN  maybe contributing.  ?- Set RHC/LHC today. The patient understands that risks included but are not limited to stroke (1 in 1000), death (1 in 57), kidney failure [usually temporary] (1 in 500), bleeding (1 in 200), allergic reaction [possibly serious] (1 in 200).  The patient understands and agrees to proceed.  ?- if cath unremarkable, will need cMRI  ?- Appears euvolemic.  ?- Continue Entresto 97-103 mg twice a day.  ?- Hold off on beta blocker for now ?- Continue spiro 25 mg daily ?- Consider SGLT2i in future, worry a bit about hygiene/risk of UTI ?- Daily BMET ? ?5. Hypomagnesia ?-Mag 1.5 ?-Give 4 grams Mag.  ? ?6. Hypokalemia ?- K 3.0  ?- Supp K and Mg  ? ?7. ETOH abuse ?-In Rehab 10 years ago for ETOH abuse.  ?-Drinks 20 beers every 2 days ?-Withdrawal resolving.  ? ?8. Hypernatremia ?- Na 147 ?- free water bolus  ? ?SDOH: ?-Uninsured. [place] on board ?-SNF recommended at discharge. May not be able to return home safely. ? ? ? ?Length of Stay: 9 ? ?Lyda Jester, PA-C  ?11/11/2021, 11:05 AM ? ?Advanced Heart Failure Team ?Pager [phone] (M-F; 7a - 5p)  ?Please contact [facility] for night-coverage after hours (5p -7a ) and weekends on [email or web address] ? ? Patient seen and examined with the above-signed Advanced  Practice Provider and/or Housestaff. I personally reviewed laboratory data, imaging studies and relevant notes. I independently examined the patient and formulated the important aspects of the plan. I have edited the note to reflect any of my changes or salient points. I have personally discussed the plan with the patient and/or family. ? ?Continues to be more interactive. Denies SOB, orthopnea or PND. Remains in NSR. Tolerating spiro and Entresto. On lovenox (full dose). No bleeding ? ?General:  Sitting up in bed No resp difficulty ?HEENT: normal ?Neck: supple. no JVD. Carotids 2+ bilat; no  bruits. No lymphadenopathy or thryomegaly appreciated. ?Cor: PMI nondisplaced. Regular rate & rhythm. No rubs, gallops or murmurs. ?Lungs: clear ?Abdomen: soft, nontender, nondistended. No hepatosplenomegaly. No bruits or masses. Good bowel sounds. ?Extremities: no cyanosis, clubbing, rash, edema ?Neuro: alert & orientedx3, cranial nerves grossly intact. moves all 4 extremities w/o difficulty. Affect pleasant ? ?Volume status looks good. Will plan R/L cath today. Continue to titrate GDMT. Likely add b-blocker after cath if output ok. Continue to supp electrolytes, Continue amio for AF. Switch to Eliquis post cath.  ? ?Glori Bickers, MD  ?12:14 PM ? ? ? ?

## 2021-11-11 NOTE — Progress Notes (Addendum)
? ? Advanced Heart Failure Rounding Note ? ?PCP-Cardiologist: None  ? ?Subjective:   ? ?Feeling better today. Denies dyspnea. No CP.  ? ?Tolerating GDMT ok w/ stable BP. Renal function normal. NSR on amio.  ? ?Getting IV replacement of K and Mg  ?K 3.0  ?Mg 1.5  ? ?Na 147  ? ?Going for cath today.  ? ? ?Objective:   ?Weight Range: ?94.7 kg ?Body mass index is 27.54 kg/m?.  ? ?Vital Signs:   ?Temp:  [97.8 ?F (36.6 ?C)-98.3 ?F (36.8 ?C)] 97.8 ?F (36.6 ?C) (04/27 0830) ?Pulse Rate:  [76-104] 82 (04/27 0830) ?Resp:  [18-20] 20 (04/27 0830) ?BP: (92-129)/(65-89) 129/81 (04/27 0830) ?SpO2:  [92 %-93 %] 92 % (04/27 0830) ?Weight:  [94.7 kg] 94.7 kg (04/27 0215) ?Last BM Date : 11/08/21 ? ?Weight change: ?Filed Weights  ? 11/10/21 0500 11/10/21 2204 11/11/21 0215  ?Weight: 91.8 kg 94.7 kg 94.7 kg  ? ? ?Intake/Output:  ? ?Intake/Output Summary (Last 24 hours) at 11/11/2021 1105 ?Last data filed at 11/11/2021 0600 ?Gross per 24 hour  ?Intake 100 ml  ?Output 350 ml  ?Net -250 ml  ?  ? ? ?Physical Exam  ? ?General:  fatigued appearing. No respiratory difficulty ?HEENT: normal ?Neck: supple. JVD not elevated. Carotids 2+ bilat; no bruits. No lymphadenopathy or thyromegaly appreciated. ?Cor: PMI nondisplaced. Regular rate & rhythm. No rubs, gallops or murmurs. ?Lungs: clear ?Abdomen: soft, nontender, nondistended. No hepatosplenomegaly. No bruits or masses. Good bowel sounds. ?Extremities: no cyanosis, clubbing, rash, edema ?Neuro: alert & oriented x 3, cranial nerves grossly intact. moves all 4 extremities w/o difficulty. Affect pleasant ? ? ?Telemetry  ? ?NSR 70s ? ?Labs  ?  ?CBC ?Recent Labs  ?  11/10/21 ?0045 11/11/21 ?0120  ?WBC 9.1 8.3  ?HGB 12.9* 11.9*  ?HCT 39.2 34.3*  ?MCV 101.8* 100.0  ?PLT 315 279  ? ?Basic Metabolic Panel ?Recent Labs  ?  11/09/21 ?0104 11/10/21 ?0045 11/11/21 ?0120  ?NA 145 147* 147*  ?K 2.8* 3.2* 3.0*  ?CL 106 110 112*  ?CO2 31 25 30   ?GLUCOSE 95 77 110*  ?BUN 18 16 14   ?CREATININE 1.04 0.98 0.89   ?CALCIUM 7.4* 7.4* 7.2*  ?MG 1.7 1.7 1.5*  ?PHOS 2.0*  --  1.5*  ? ?Liver Function Tests ?No results for input(s): AST, ALT, ALKPHOS, BILITOT, PROT, ALBUMIN in the last 72 hours. ? ?No results for input(s): LIPASE, AMYLASE in the last 72 hours. ?Cardiac Enzymes ?No results for input(s): CKTOTAL, CKMB, CKMBINDEX, TROPONINI in the last 72 hours. ? ?BNP: ?BNP (last 3 results) ?No results for input(s): BNP in the last 8760 hours. ? ?ProBNP (last 3 results) ?No results for input(s): PROBNP in the last 8760 hours. ? ? ?D-Dimer ?No results for input(s): DDIMER in the last 72 hours. ?Hemoglobin A1C ?No results for input(s): HGBA1C in the last 72 hours. ?Fasting Lipid Panel ?No results for input(s): CHOL, HDL, LDLCALC, TRIG, CHOLHDL, LDLDIRECT in the last 72 hours. ?Thyroid Function Tests ?No results for input(s): TSH, T4TOTAL, T3FREE, THYROIDAB in the last 72 hours. ? ?Invalid input(s): FREET3 ? ?Other results: ? ? ?Imaging  ? ? ?No results found. ? ? ?Medications:   ? ? ?Scheduled Medications: ? amiodarone  200 mg Oral BID  ? enoxaparin (LOVENOX) injection  95 mg Subcutaneous [id number]  ? folic acid  1 mg Oral Daily  ? mouth rinse  15 mL Mouth Rinse BID  ? multivitamin with minerals  1 tablet Oral Daily  ? [  START ON 11/12/2021] sacubitril-valsartan  1 tablet Oral BID  ? sodium chloride flush  3 mL Intravenous Q12H  ? spironolactone  25 mg Oral Daily  ? thiamine  100 mg Oral Daily  ? Or  ? thiamine  100 mg Intravenous Daily  ? umeclidinium bromide  1 puff Inhalation Daily  ? ? ?Infusions: ? sodium chloride    ? sodium chloride 10 mL/hr at 11/11/21 [id number]  ? magnesium sulfate bolus IVPB 4 g (11/11/21 0945)  ? potassium chloride 10 mEq (11/11/21 1008)  ? potassium PHOSPHATE IVPB (in mmol)    ? ? ? ?PRN Medications: ?sodium chloride, acetaminophen, albuterol, sodium chloride flush ? ? ? ?Patient Profile  ? ?Mr Tingle is a  61 year old with HTN, emphysema, ETOH abuse,and tobacco abuse.  ?  ?Admitted with symptomatic  hypercalcemia. ? ?Assessment/Plan  ? ?1. Hypercalcemia  ?- Treatment and workup per primary team.  ?- Calcium on admit 14.5. Calcium now down to 7.2 ?  ?2. Delirium  ?- ETOH withdrawal. On CIWA protocol.  ?- Resolving. A&Ox3.  ?  ?3. A Fib RVR ?- Developed A fib RVR 11/03/21. Converted on amio drip.  ?- Went back in NSR on 4/22. Amio drip stopped 04/24. ?- Maintaining SR. Continue amiodarone 200 mg BID.  ?- Replace Mag and K aggressively. ?- Anticoagulated with Lovenox ?  ?4.  Acute HFrEF  ?- New reduced EF. Unclear etiology. No previous history of coronary disease.  ?- Echo EF 25%, mid and apical hypokinesis with sparing of base, RV mildly reduced. Echo concerning for Takotsubo.  ETOH abuse/HTN  maybe contributing.  ?- Set RHC/LHC today. The patient understands that risks included but are not limited to stroke (1 in 1000), death (1 in 59), kidney failure [usually temporary] (1 in 500), bleeding (1 in 200), allergic reaction [possibly serious] (1 in 200).  The patient understands and agrees to proceed.  ?- if cath unremarkable, will need cMRI  ?- Appears euvolemic.  ?- Continue Entresto 97-103 mg twice a day.  ?- Hold off on beta blocker for now ?- Continue spiro 25 mg daily ?- Consider SGLT2i in future, worry a bit about hygiene/risk of UTI ?- Daily BMET ? ?5. Hypomagnesia ?-Mag 1.5 ?-Give 4 grams Mag.  ? ?6. Hypokalemia ?- K 3.0  ?- Supp K and Mg  ? ?7. ETOH abuse ?-In Rehab 10 years ago for ETOH abuse.  ?-Drinks 20 beers every 2 days ?-Withdrawal resolving.  ? ?8. Hypernatremia ?- Na 147 ?- free water bolus  ? ?SDOH: ?-Uninsured. [place] on board ?-SNF recommended at discharge. May not be able to return home safely. ? ? ? ?Length of Stay: 9 ? ?Lyda Jester, PA-C  ?11/11/2021, 11:05 AM ? ?Advanced Heart Failure Team ?Pager [phone] (M-F; 7a - 5p)  ?Please contact [facility] for night-coverage after hours (5p -7a ) and weekends on [email or web address] ? ? Patient seen and examined with the above-signed Advanced  Practice Provider and/or Housestaff. I personally reviewed laboratory data, imaging studies and relevant notes. I independently examined the patient and formulated the important aspects of the plan. I have edited the note to reflect any of my changes or salient points. I have personally discussed the plan with the patient and/or family. ? ?Continues to be more interactive. Denies SOB, orthopnea or PND. Remains in NSR. Tolerating spiro and Entresto. On lovenox (full dose). No bleeding ? ?General:  Sitting up in bed No resp difficulty ?HEENT: normal ?Neck: supple. no JVD. Carotids 2+ bilat; no  bruits. No lymphadenopathy or thryomegaly appreciated. ?Cor: PMI nondisplaced. Regular rate & rhythm. No rubs, gallops or murmurs. ?Lungs: clear ?Abdomen: soft, nontender, nondistended. No hepatosplenomegaly. No bruits or masses. Good bowel sounds. ?Extremities: no cyanosis, clubbing, rash, edema ?Neuro: alert & orientedx3, cranial nerves grossly intact. moves all 4 extremities w/o difficulty. Affect pleasant ? ?Volume status looks good. Will plan R/L cath today. Continue to titrate GDMT. Likely add b-blocker after cath if output ok. Continue to supp electrolytes, Continue amio for AF. Switch to Eliquis post cath.  ? ?Glori Bickers, MD  ?12:14 PM ? ? ? ?

## 2021-11-11 NOTE — Progress Notes (Signed)
Secure chat with Philippa Chester RN and MD re PIV.  Pt has adequate PIV access (2PIV's) at this time for current medications ordered. No PIV needed at this time.  ?

## 2021-11-11 NOTE — Interval H&P Note (Signed)
History and Physical Interval Note: ? ?11/11/2021 ?12:15 PM ? ?Luis Marquez  has presented today for surgery, with the diagnosis of heart failure.  The various methods of treatment have been discussed with the patient and family. After consideration of risks, benefits and other options for treatment, the patient has consented to  Procedure(s): ?RIGHT/LEFT HEART CATH AND CORONARY ANGIOGRAPHY (N/A) and possible coronary angiography as a surgical intervention.  The patient's history has been reviewed, patient examined, no change in status, stable for surgery.  I have reviewed the patient's chart and labs.  Questions were answered to the patient's satisfaction.   ? ? ?Colene Mines ? ? ?

## 2021-11-12 ENCOUNTER — Encounter (HOSPITAL_COMMUNITY): Payer: Self-pay | Admitting: Internal Medicine

## 2021-11-12 ENCOUNTER — Other Ambulatory Visit (HOSPITAL_COMMUNITY): Payer: Self-pay

## 2021-11-12 ENCOUNTER — Inpatient Hospital Stay (HOSPITAL_COMMUNITY): Payer: Medicaid Other

## 2021-11-12 ENCOUNTER — Telehealth: Payer: Self-pay | Admitting: Pharmacy Technician

## 2021-11-12 ENCOUNTER — Telehealth (HOSPITAL_COMMUNITY): Payer: Self-pay | Admitting: Pharmacist

## 2021-11-12 DIAGNOSIS — I5021 Acute systolic (congestive) heart failure: Secondary | ICD-10-CM

## 2021-11-12 LAB — CBC
HCT: 31.5 % — ABNORMAL LOW (ref 39.0–52.0)
Hemoglobin: 10.8 g/dL — ABNORMAL LOW (ref 13.0–17.0)
MCH: 34.6 pg — ABNORMAL HIGH (ref 26.0–34.0)
MCHC: 34.3 g/dL (ref 30.0–36.0)
MCV: 101 fL — ABNORMAL HIGH (ref 80.0–100.0)
Platelets: 255 10*3/uL (ref 150–400)
RBC: 3.12 MIL/uL — ABNORMAL LOW (ref 4.22–5.81)
RDW: 13.7 % (ref 11.5–15.5)
WBC: 8.1 10*3/uL (ref 4.0–10.5)
nRBC: 0 % (ref 0.0–0.2)

## 2021-11-12 LAB — BASIC METABOLIC PANEL
Anion gap: 6 (ref 5–15)
BUN: 10 mg/dL (ref 8–23)
CO2: 27 mmol/L (ref 22–32)
Calcium: 6.9 mg/dL — ABNORMAL LOW (ref 8.9–10.3)
Chloride: 109 mmol/L (ref 98–111)
Creatinine, Ser: 0.93 mg/dL (ref 0.61–1.24)
GFR, Estimated: >60 mL/min (ref >60)
Glucose, Bld: 107 mg/dL — ABNORMAL HIGH (ref 70–99)
Potassium: 3.2 mmol/L — ABNORMAL LOW (ref 3.5–5.1)
Sodium: 142 mmol/L (ref 135–145)

## 2021-11-12 LAB — PHOSPHORUS: Phosphorus: 2.8 mg/dL (ref 2.5–4.6)

## 2021-11-12 LAB — MAGNESIUM: Magnesium: 1.7 mg/dL (ref 1.7–2.4)

## 2021-11-12 MED ORDER — FOLIC ACID 1 MG PO TABS
1.0000 mg | ORAL_TABLET | Freq: Every day | ORAL | 0 refills | Status: DC
  Filled 2021-11-12: qty 30, 30d supply, fill #0

## 2021-11-12 MED ORDER — NALTREXONE HCL 50 MG PO TABS
50.0000 mg | ORAL_TABLET | Freq: Every day | ORAL | 0 refills | Status: DC
  Filled 2021-11-12: qty 30, 30d supply, fill #0

## 2021-11-12 MED ORDER — SACUBITRIL-VALSARTAN 97-103 MG PO TABS
1.0000 | ORAL_TABLET | Freq: Two times a day (BID) | ORAL | 0 refills | Status: DC
  Filled 2021-11-12: qty 60, 30d supply, fill #0

## 2021-11-12 MED ORDER — SPIRONOLACTONE 25 MG PO TABS
25.0000 mg | ORAL_TABLET | Freq: Every day | ORAL | 0 refills | Status: DC
  Filled 2021-11-12: qty 30, 30d supply, fill #0

## 2021-11-12 MED ORDER — APIXABAN 5 MG PO TABS
5.0000 mg | ORAL_TABLET | Freq: Two times a day (BID) | ORAL | 0 refills | Status: DC
Start: 2021-11-12 — End: 2021-12-20
  Filled 2021-11-12: qty 60, 30d supply, fill #0

## 2021-11-12 MED ORDER — MAGNESIUM SULFATE 2 GM/50ML IV SOLN
2.0000 g | Freq: Once | INTRAVENOUS | Status: AC
  Administered 2021-11-12: 2 g via INTRAVENOUS
  Filled 2021-11-12: qty 50

## 2021-11-12 MED ORDER — POTASSIUM CHLORIDE 10 MEQ/100ML IV SOLN
10.0000 meq | INTRAVENOUS | Status: AC
  Administered 2021-11-12 (×4): 10 meq via INTRAVENOUS
  Filled 2021-11-12 (×4): qty 100

## 2021-11-12 MED ORDER — AMIODARONE HCL 200 MG PO TABS
200.0000 mg | ORAL_TABLET | Freq: Two times a day (BID) | ORAL | 0 refills | Status: DC
  Filled 2021-11-12: qty 60, 30d supply, fill #0

## 2021-11-12 MED ORDER — GADOBUTROL 1 MMOL/ML IV SOLN
12.0000 mL | Freq: Once | INTRAVENOUS | Status: AC | PRN
  Administered 2021-11-12: 12 mL via INTRAVENOUS

## 2021-11-12 MED ORDER — POTASSIUM CHLORIDE 20 MEQ PO PACK
40.0000 meq | PACK | Freq: Once | ORAL | Status: AC
  Administered 2021-11-12: 40 meq via ORAL
  Filled 2021-11-12: qty 2

## 2021-11-12 MED ORDER — METOPROLOL SUCCINATE ER 25 MG PO TB24
12.5000 mg | ORAL_TABLET | Freq: Every day | ORAL | 0 refills | Status: DC
  Filled 2021-11-12: qty 15, 30d supply, fill #0

## 2021-11-12 MED ORDER — METOPROLOL SUCCINATE ER 25 MG PO TB24
12.5000 mg | ORAL_TABLET | Freq: Every day | ORAL | Status: DC
  Administered 2021-11-12 – 2021-11-16 (×5): 12.5 mg via ORAL
  Filled 2021-11-12 (×5): qty 1

## 2021-11-12 NOTE — Progress Notes (Addendum)
? ?HD#10 ?SUBJECTIVE:  ?Patient Summary:  ?Luis Marquez is a 61 y.o. with a pertinent PMH of emphysema, and prior tobacco use, who presented with fall and admitted for severe symptomatic high hypercalcemia, who went through alcohol withdraw developed atrial fibrillation with RVR and then was found to have heart failure with reduced ejection fraction. Right/ Left heart cath showed high output heart failure with no obstruction. ? ?Overnight Events: none ? ?Interim History: Patient assessed at bedside this AM.  He remembers the team and states that he is in hospital due to his heart. He reports getting a CT this morning to look at heart and not having to have stents yesterday. We talked with him about him withdrawing from alcohol. He states that he knows that he needs to cut back. He has been speaking with people from [place] and is considering going back to work with them. No other concerns at this time. ? ? ?OBJECTIVE:  ?Vital Signs: ?Vitals:  ? 11/12/21 0008 11/12/21 0408 11/12/21 0736 11/12/21 1027  ?BP: 103/61 136/73 127/80 98/82  ?Pulse: 81 73 81 95  ?Resp: _0 ?Temp: 98.6 ?F (37 ?C) 98.7 ?F (37.1 ?C) 98.2 ?F (36.8 ?C)   ?TempSrc: Oral Oral Oral   ?SpO2: 96% (P) 95% 93% 92%  ?Weight:  93.3 kg    ?Height:      ? ?Supplemental O2: Room Air ?SpO2: 92 % ?O2 Flow Rate (L/min): 2 L/min ? ?Filed Weights  ? 11/10/21 2204 11/11/21 0215 11/12/21 0408  ?Weight: 94.7 kg 94.7 kg 93.3 kg  ? ? ? ?Intake/Output Summary (Last 24 hours) at 11/12/2021 1415 ?Last data filed at 11/12/2021 1120 ?Gross per 24 hour  ?Intake 243 ml  ?Output 930 ml  ?Net -687 ml  ? ?Net IO Since Admission: -3,768.77 mL [11/12/21 1415] ? ?Physical Exam: ?Constitutional: Sitting up in bed, alert ?HENT: normocephalic, healing abrasion on right temple, mucous membranes moist ?Eyes: conjunctiva non-erythematous ?Neck: supple ?Cardiovascular: Regular rate and rhythm, no m/r/g, no JVD ?Pulmonary/Chest: Normal work of breathing on room air, lungs  clear to auscultation bilaterally ?Abdominal: soft, non-tender, non-distended ?MSK: no peripheral edema present to lower extremities bilaterally ?Skin: warm and dry ?Psych: normal mood and affect ? ?Patient Lines/Drains/Airways Status   ? ? Active Line/Drains/Airways   ? ? Name Placement date Placement time Site Days  ? Peripheral IV 11/02/21 20 G Right Antecubital 11/02/21  1511  Antecubital  1  ? External Urinary Catheter 11/03/21  0340  --  less than 1  ? Incision (Closed) 11/11/14 Hip Right 11/11/14  2212  -- 2549  ? ?  ?  ? ?  ? ? ?Pertinent Labs: ? ?  Latest Ref Rng & Units 11/12/2021  ?  2:51 AM 11/11/2021  ?  1:22 PM 11/11/2021  ?  1:15 PM  ?CBC  ?WBC 4.0 - 10.5 K/uL 8.1      ?Hemoglobin 13.0 - 17.0 g/dL 10.8   8.8   10.2    ?Hematocrit 39.0 - 52.0 % 31.5   26.0   30.0    ?Platelets 150 - 400 K/uL 255      ? ? ? ?  Latest Ref Rng & Units 11/12/2021  ?  2:51 AM 11/11/2021  ?  1:22 PM 11/11/2021  ?  1:15 PM  ?CMP  ?Glucose 70 - 99 mg/dL 107      ?BUN 8 - 23 mg/dL 10      ?Creatinine 0.61 - 1.24 mg/dL 0.93      ?  Sodium 135 - 145 mmol/L 142   150   148    ?Potassium 3.5 - 5.1 mmol/L 3.2   2.7   3.3    ?Chloride 98 - 111 mmol/L 109      ?CO2 22 - 32 mmol/L 27      ?Calcium 8.9 - 10.3 mg/dL 6.9      ? ? ?No results for input(s): GLUCAP in the last 72 hours. ?  ? ?Pertinent Imaging: ?No results found. ? ?ASSESSMENT/PLAN:  ?Assessment: ?Principal Problem: ?  Hypercalcemia ?Active Problems: ?  Delirium tremens ([place]) ?  Hypokalemia ?  Solitary pulmonary nodule ?  Hand laceration ?  Essential hypertension ?  Alcohol withdrawal ([place]) ?  AKI (acute kidney injury) ([place]) ?  Atrial fibrillation with RVR ([place]) ?  Acute HFrEF (heart failure with reduced ejection fraction) ([place]) ? ? ?Luis Marquez is a 61 y.o. with a pertinent PMH of emphysema, and prior tobacco use, who presented with fall and admitted for severe symptomatic high hypercalcemia, who went through alcohol withdraw developed atrial fibrillation with RVR and then was  found to have heart failure with reduced ejection fraction. Right/ Left heart cath showed high output heart failure with no obstruction. ? ?Plan: ?Acute heart failure with reduced ejection fraction ?Non- Obstructive CAD ?Right/left heart catheter showed high output heart failure with no obstruction in arteries.  Cardiac MRI completed today, is also pending.  Patient will need to follow-up with cardiology.  Likely that his heart failure could be due to alcohol versus electrolyte abnormalities. He will need to take medications and repeat ultrasound in 3 months. ?- Entresto 97-103 mg BID ?- spironolactone 25 mg qd ?- Add metoprolol succinate 12.5 mg ?- appreciate social work assistance with applying for insurance ? ?Atrial fibrillation with RVR-resolved ?Heart rates in the 80s to 90s.  Remains on p.o. amiodarone. ?-Continue amiodarone 200 mg ?-Eliquis ? ?Alcohol withdraw ?Metabolic encephalopathy ?Mentation is at baseline. We talked about starting naltrexone to help with craving at discharge and he was open to this idea. ?- naltrexone at discharge ? ?Symptomatic hypercalcemia- resolved ?Hypokalemia ?Hypochloridemia ?Elevated bicarb ?Chloride depleted metabolic alkalosis ?He was given 1 dose of zoledronic acid on 4/18 and two doses of calcitonin. Electrolytes have improved. Unclear cause of hypercalcemia.  PTH was low at 8.  Thyroid disease, granuloma disease have been ruled out. Differentials include paraneoplastic syndrome due to cancer vs lytic bone disease vs multiple myeloma (lights chain ratio wnl). PTHrP was less 2. He will need further evaluation as outpatient with PET scan for lytic lesions causing hypercalcemia. ?-PET scan as outpatient for the pulmonary nodule ?-Daily BMP ? ?Right hand laceration ?Sutures placed on 4/19.  Will remove sutures today. ? ?Best Practice: ?Diet: heart healthy ?IVF: Fluids: none ?VTE: Eliquis for a fib ?Code: Full ?AB: none ?Therapy Recs: SNF, lacks insurance, appreciate social  works assistance with applying ?Family: brother, Mikki Santee ?DISPO: Anticipated discharge in 1-2 days  pending further evaluation of cardiomyopathy and evaluation by physical therapy. ? ?Signature: ?Daleen Bo. Damar Petit, D.O.  ?Internal Medicine Resident, PGY-1 ?[facility]  ?Pager: [phone] ?2:15 PM, 11/12/2021  ? ?Please contact the on call pager after 5 pm and on weekends at [phone].  ?

## 2021-11-12 NOTE — Evaluation (Signed)
Occupational Therapy Evaluation Patient Details Name: Luis Marquez MRN: [id number] DOB: 1961/06/20 Today's Date: 11/12/2021   History of Present Illness 61 y/o male presented to ED on 11/02/21 after fall in parking lot and hitting R hand with urinary incontinence. CT head negative. Admitted for severe electrolyte imbalance, AKI, and ETOH withdrawal. PMH: HTN, alcohol use disorder, RLE with osteoarthritis with intermedullary nail placement in R femur 2016.   Clinical Impression   Prior to this admission, patient was living in a recovery house with 5 other men, driving, and operating a forklift at work. Currently, patient presenting with decreased cognition and safety awareness (though improved from PT eval) need for mod A of 1-2 to complete sit<>stands from EOB, and moderate assist to complete ADLs. Patient will need education with regard to adaptive equipment, further assessment of cognition as he managed his own medication, and need for skilled OT services as patient will need to be close to independent as possible in order to return to his recovery house. Recommendations made for HHOT at discharge. OT will continue to follow acutely in order to address functional deficits listed below.      Recommendations for follow up therapy are one component of a multi-disciplinary discharge planning process, led by the attending physician.  Recommendations may be updated based on patient status, additional functional criteria and insurance authorization.   Follow Up Recommendations  Home health OT    Assistance Recommended at Discharge PRN  Patient can return home with the following A lot of help with walking and/or transfers;A lot of help with bathing/dressing/bathroom;Assistance with cooking/housework;Direct supervision/assist for medications management;Direct supervision/assist for financial management;Assist for transportation;Help with stairs or ramp for entrance    Functional Status Assessment   Patient has had a recent decline in their functional status and demonstrates the ability to make significant improvements in function in a reasonable and predictable amount of time.  Equipment Recommendations  Other (comment) (Will continue to assess, will need adaptive equipment most likely)    Recommendations for Other Services       Precautions / Restrictions Precautions Precautions: Fall Precaution Comments: R radial heart cath 4/27      Mobility Bed Mobility Overal bed mobility: Needs Assistance Bed Mobility: Supine to Sit     Supine to sit: Supervision     General bed mobility comments: supervision for safety, increased time and assist for line management    Transfers Overall transfer level: Needs assistance Equipment used: Rolling walker (2 wheels) Transfers: Sit to/from Stand Sit to Stand: Mod assist, +2 physical assistance           General transfer comment: modA of 1-2 to power up to standing. max cues for hand placement on bed rather than pulling on RW, poor functional power in BLE to stand      Balance Overall balance assessment: Needs assistance Sitting-balance support: Bilateral upper extremity supported Sitting balance-Leahy Scale: Poor Sitting balance - Comments: Sitting with bil UE support and supervision   Standing balance support: Bilateral upper extremity supported, Reliant on assistive device for balance Standing balance-Leahy Scale: Poor Standing balance comment: BUE support on RW and minA to steady                           ADL either performed or assessed with clinical judgement   ADL Overall ADL's : Needs assistance/impaired Eating/Feeding: Set up;Sitting   Grooming: Set up;Sitting   Upper Body Bathing: Set up;Sitting   Lower Body Bathing:  Minimal assistance;Sitting/lateral leans   Upper Body Dressing : Set up;Sitting   Lower Body Dressing: Moderate assistance;Maximal assistance;Sitting/lateral leans Lower Body  Dressing Details (indicate cue type and reason): unable to don and doff socks currently, but was independent at baseline Toilet Transfer: Moderate assistance;+2 for safety/equipment;+2 for physical assistance;Cueing for safety;Cueing for sequencing;Ambulation   Toileting- Clothing Manipulation and Hygiene: Minimal assistance;Moderate assistance;Sitting/lateral lean;Sit to/from stand       Functional mobility during ADLs: Moderate assistance;+2 for physical assistance;+2 for safety/equipment;Cueing for safety;Cueing for sequencing;Rolling walker (2 wheels) General ADL Comments: Patient presenting with decreased activity tolerance, decreased cognition and decreased insight into impairments     Vision Baseline Vision/History: 0 No visual deficits Ability to See in Adequate Light: 0 Adequate Patient Visual Report: No change from baseline       Perception     Praxis      Pertinent Vitals/Pain Pain Assessment Pain Assessment: Faces Faces Pain Scale: Hurts little more Pain Location: RLE Pain Descriptors / Indicators: Grimacing, Sore Pain Intervention(s): Limited activity within patient's tolerance, Repositioned, Monitored during session     Hand Dominance Right   Extremity/Trunk Assessment Upper Extremity Assessment Upper Extremity Assessment: Generalized weakness   Lower Extremity Assessment Lower Extremity Assessment: Defer to PT evaluation   Cervical / Trunk Assessment Cervical / Trunk Assessment: Kyphotic   Communication Communication Communication: No difficulties   Cognition Arousal/Alertness: Awake/alert Behavior During Therapy: WFL for tasks assessed/performed Overall Cognitive Status: Impaired/Different from baseline Area of Impairment: Problem solving                             Problem Solving: Slow processing, Decreased initiation, Requires verbal cues General Comments: verbal cues for safety, pt following simple cues and demos good memory of how  to call for assist     General Comments  VSS with HR in 100s on RA    Exercises     Shoulder Instructions      Home Living Family/patient expects to be discharged to:: Other (Comment) ("recovery home") Living Arrangements: Other (Comment) (recovery home with 5 guys) Available Help at Discharge:  (None) Type of Home: House Home Access: Stairs to enter [employer] of Steps: 6-7 Entrance Stairs-Rails: Can reach both Home Layout: One level                   Additional Comments: patient is able to get meals at his recovery home, 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 IM nail placement 2016. drives forklift for work ADLs Comments: independent with ADLs but increasing difficulty with RLE. drives forklift at work        OT Problem List: Decreased strength;Decreased activity tolerance;Impaired balance (sitting and/or standing);Decreased coordination;Decreased cognition;Decreased safety awareness;Decreased knowledge of use of DME or AE;Decreased knowledge of precautions;Pain      OT Treatment/Interventions: Self-care/ADL training;Therapeutic exercise;Energy conservation;DME and/or AE instruction;Manual therapy;Therapeutic activities;Cognitive remediation/compensation;Patient/family education;Balance training    OT Goals(Current goals can be found in the care plan section) Acute Rehab OT Goals Patient Stated Goal: to get back to my independent life OT Goal Formulation: With patient Time For Goal Achievement: 11/26/21 Potential to Achieve Goals: Good ADL Goals Pt  Will Perform Lower Body Bathing: Independently;sit to/from stand;with adaptive equipment;sitting/lateral leans Pt Will Perform  Lower Body Dressing: Independently;with adaptive equipment;sitting/lateral leans;sit to/from stand Pt Will Transfer to Toilet: Independently;ambulating;regular height toilet Additional ADL Goal #1: Patient will verbalize 3 strategies for energy conservation for safe discharge home. Additional ADL Goal #2: Patient will be able to compelte pillbox test with no errors in order to safely manage medications at home.  OT Frequency: Min 3X/week    Co-evaluation              AM-PAC OT "6 Clicks" Daily Activity     Outcome Measure Help from another person eating meals?: A Little Help from another person taking care of personal grooming?: A Little Help from another person toileting, which includes using toliet, bedpan, or urinal?: A Lot Help from another person bathing (including washing, rinsing, drying)?: A Lot Help from another person to put on and taking off regular upper body clothing?: A Little Help from another person to put on and taking off regular lower body clothing?: A Lot 6 Click Score: 15   End of Session Equipment Utilized During Treatment: Gait belt;Rolling walker (2 wheels) Nurse Communication: Mobility status  Activity Tolerance: Patient limited by fatigue Patient left: in chair;with call bell/phone within reach;with chair alarm set  OT Visit Diagnosis: Unsteadiness on feet (R26.81);Other abnormalities of gait and mobility (R26.89);Repeated falls (R29.6);History of falling (Z91.81);Muscle weakness (generalized) (M62.81);Other symptoms and signs involving cognitive function;Pain Pain - Right/Left: Right Pain - part of body: Leg                Time: 1610-9604 OT Time Calculation (min): 14 min Charges:  OT General Charges $OT Visit: 1 Visit OT Evaluation $OT Eval Moderate Complexity: 1 Mod  Pollyann Glen E. Lean Jaeger, OTR/L Acute Rehabilitation Services [phone]   Cherlyn Cushing 11/12/2021, 4:37 PM

## 2021-11-12 NOTE — Progress Notes (Addendum)
10 sutures removed from right hand at bedside, laceration is well healed with no erythema or edema in place.  ?

## 2021-11-12 NOTE — Progress Notes (Signed)
Physical Therapy Treatment ?Patient Details ?Name: Luis Marquez ?MRN: [id number] ?DOB: [birth year] ?Today's Date: 11/12/2021 ? ? ?History of Present Illness 61 y/o male presented to ED on 11/02/21 after fall in parking lot and hitting R hand with urinary incontinence. CT head negative. Admitted for severe electrolyte imbalance, AKI, and ETOH withdrawal. PMH: HTN, alcohol use disorder, RLE with osteoarthritis with intermedullary nail placement in R femur 2016. ? ?  ?PT Comments  ? ? The pt presents with significant improvement in cognition this afternoon, and states he would like to work towards d/c home to his recovery house where he lives with 5 guys and must be fully independent with mobility and ADLs. The pt continues to require modA of 1-2 to power up to standing due to deficits in strength and power in BLE at this time. He was able to progress ambulation distance well at this time (completing ~75 ft hallway ambulation with minG-minA), but will require continued skilled PT acutely to progress independence and meet the pt's goal of return to his recovery house.  ? ?I added OT consult due to pt's need to be independent with ADLs to return to the recovery house.  ?   ?Recommendations for follow up therapy are one component of a multi-disciplinary discharge planning process, led by the attending physician.  Recommendations may be updated based on patient status, additional functional criteria and insurance authorization. ? ?Follow Up Recommendations ? Home health PT ?  ?  ?Assistance Recommended at Discharge Frequent or constant Supervision/Assistance  ?Patient can return home with the following A lot of help with walking and/or transfers;A little help with bathing/dressing/bathroom;Assistance with cooking/housework;Direct supervision/assist for medications management;Assist for transportation;Help with stairs or ramp for entrance ?  ?Equipment Recommendations ? Rolling walker (2 wheels)  ?  ?Recommendations for Other  Services OT consult ? ? ?  ?Precautions / Restrictions Precautions ?Precautions: Fall ?Precaution Comments: R radial heart cath 4/27  ?  ? ?Mobility ? Bed Mobility ?Overal bed mobility: Needs Assistance ?Bed Mobility: Supine to Sit ?  ?  ?Supine to sit: Supervision ?  ?  ?General bed mobility comments: supervision for safety, increased time and assist for line management ?  ? ?Transfers ?Overall transfer level: Needs assistance ?Equipment used: Rolling walker (2 wheels) ?Transfers: Sit to/from Stand ?Sit to Stand: Mod assist, +2 physical assistance ?  ?  ?  ?  ?  ?General transfer comment: modA of 1-2 to power up to standing. max cues for hand placement on bed rather than pulling on RW, poor functional power in BLE to stand ?  ? ?Ambulation/Gait ?Ambulation/Gait assistance: Min assist, +2 safety/equipment ?Gait Distance (Feet): 75 Feet ?Assistive device: Rolling walker (2 wheels) ?Gait Pattern/deviations: Step-to pattern, Decreased stride length, Antalgic, Shuffle ?Gait velocity: decreased ?Gait velocity interpretation: <1.31 ft/sec, indicative of household ambulator ?  ?General Gait Details: small steps with poor wt acceptance on RLE due to chronic pain. no overt LOB but minG-minA to provide minor stability. VSS on RA ? ? ? ?  ?Balance Overall balance assessment: Needs assistance ?Sitting-balance support: Bilateral upper extremity supported ?Sitting balance-Leahy Scale: Poor ?Sitting balance - Comments: Sitting with bil UE support and supervision ?  ?Standing balance support: Bilateral upper extremity supported, Reliant on assistive device for balance ?Standing balance-Leahy Scale: Poor ?Standing balance comment: BUE support on RW and minA to steady ?  ?  ?  ?  ?  ?  ?  ?  ?  ?  ?  ?  ? ?  ?  Cognition Arousal/Alertness: Awake/alert ?Behavior During Therapy: [facility] for tasks assessed/performed ?Overall Cognitive Status: Impaired/Different from baseline ?Area of Impairment: Problem solving ?  ?  ?  ?  ?  ?  ?  ?  ?  ?  ?   ?  ?  ?  ?Problem Solving: Slow processing, Decreased initiation, Requires verbal cues ?General Comments: verbal cues for safety, pt following simple cues and demos good memory of how to call for assist ?  ?  ? ?  ?   ?General Comments General comments (skin integrity, edema, etc.): VSS with HR in 100s on RA ?  ?  ? ?Pertinent Vitals/Pain Pain Assessment ?Pain Assessment: Faces ?Faces Pain Scale: Hurts little more ?Pain Location: RLE ?Pain Descriptors / Indicators: Grimacing, Sore ?Pain Intervention(s): Limited activity within patient's tolerance, Monitored during session, Repositioned  ? ? ?Home Living Family/patient expects to be discharged to:: Other (Comment) ("recovery home") ?Living Arrangements: Other (Comment) (recovery home with 5 guys) ?Available Help at Discharge:  (none) ?Type of Home: House ?Home Access: Stairs to enter ?Entrance Stairs-Rails: Can reach both ?Entrance Stairs-Number of Steps: 6-7 ?  ?Home Layout: One level ?  ?   ?  ?   ? ?PT Goals (current goals can now be found in the care plan section) Acute Rehab PT Goals ?Patient Stated Goal: to be able to stand up by himself, and go home ?PT Goal Formulation: With patient ?Time For Goal Achievement: 11/17/21 ?Potential to Achieve Goals: Fair ?Progress towards PT goals: Progressing toward goals ? ?  ?Frequency ? ? ? Min 3X/week ? ? ? ?  ?PT Plan Current plan remains appropriate  ? ? ?   ?AM-PAC PT "6 Clicks" Mobility   ?Outcome Measure ? Help needed turning from your back to your side while in a flat bed without using bedrails?: A Little ?Help needed moving from lying on your back to sitting on the side of a flat bed without using bedrails?: A Little ?Help needed moving to and from a bed to a chair (including a wheelchair)?: A Lot ?Help needed standing up from a chair using your arms (e.g., wheelchair or bedside chair)?: A Lot ?Help needed to walk in hospital room?: A Little ?Help needed climbing 3-5 steps with a railing? : A Lot ?6 Click Score:  15 ? ?  ?End of Session Equipment Utilized During Treatment: Gait belt ?Activity Tolerance: Patient tolerated treatment well ?Patient left: in chair;with call bell/phone within reach;with chair alarm set ?Nurse Communication: Mobility status ?PT Visit Diagnosis: Muscle weakness (generalized) (M62.81);Other abnormalities of gait and mobility (R26.89) ?  ? ? ?Time: 3016-0109 ?PT Time Calculation (min) (ACUTE ONLY): 15 min ? ?Charges:  $Therapeutic Exercise: 8-22 mins          ?          ? ?Vickki Muff, PT, DPT  ? ?Acute Rehabilitation Department ?Pager #: [phone] ? ? ?Ronnie Derby ?11/12/2021, 4:09 PM ? ?

## 2021-11-12 NOTE — Progress Notes (Addendum)
? ? Advanced Heart Failure Rounding Note ? ?PCP-Cardiologist: None  ? ?Subjective:   ? ?R/LHC yesterday w/ mild nonobstructive CAD, normal filling pressures w/ high CO. ? ?cMRI performed today, interpretation pending.  ? ?Feels better today. No dyspnea. Rt radial cath site ok. Renal function stable. K low 3.2, getting replacement.  ? ?No complaints today.  ? ?R/LHC  ?  Prox Cx lesion is 20% stenosed. ?  Mid LAD lesion is 30% stenosed. ?  The left ventricular ejection fraction is 25-35% by visual estimate. ?  ?Findings: ?  ?Ao = 119/68 (90) ?LV = 119/17 ?RA =  3 ?RV = 27/6 ?PA = 25/9 (15) ?PCW = 10 ?Fick cardiac output/index = 8.2/3.7 ?PVR = 0.6 WU ?Ao sat = 94% ?PA sat = 72%, 73% ?SVC sat = 74% ?  ?Assessment: ?1. Mild non-obstructive CAD ?2. NICM - EF hard to estimate from v-gram but seems to be in the 30-35% range ?3. Normal filling pressures with high cardiac output ?4. No evidence of intracardiac shunting ? ?Objective:   ?Weight Range: ?93.3 kg ?Body mass index is 27.13 kg/m?.  ? ?Vital Signs:   ?Temp:  [98.1 ?F (36.7 ?C)-98.7 ?F (37.1 ?C)] 98.2 ?F (36.8 ?C) (04/28 5784) ?Pulse Rate:  [73-95] 95 (04/28 1027) ?Resp:  [16-25] 16 (04/28 0736) ?BP: (98-145)/(61-97) 98/82 (04/28 1027) ?SpO2:  [92 %-97 %] 92 % (04/28 1027) ?Weight:  [93.3 kg] 93.3 kg (04/28 0408) ?Last BM Date : 11/08/21 ? ?Weight change: ?Filed Weights  ? 11/10/21 2204 11/11/21 0215 11/12/21 0408  ?Weight: 94.7 kg 94.7 kg 93.3 kg  ? ? ?Intake/Output:  ? ?Intake/Output Summary (Last 24 hours) at 11/12/2021 1403 ?Last data filed at 11/12/2021 1120 ?Gross per 24 hour  ?Intake 243 ml  ?Output 930 ml  ?Net -687 ml  ?  ? ? ?Physical Exam  ? ?General:  Well appearing. No respiratory difficulty ?HEENT: normal ?Neck: supple. no JVD. Carotids 2+ bilat; no bruits. No lymphadenopathy or thyromegaly appreciated. ?Cor: PMI nondisplaced. Regular rate & rhythm. No rubs, gallops or murmurs. ?Lungs: clear ?Abdomen: soft, nontender, nondistended. No hepatosplenomegaly.  No bruits or masses. Good bowel sounds. ?Extremities: no cyanosis, clubbing, rash, edema ?Neuro: alert & oriented x 3, cranial nerves grossly intact. moves all 4 extremities w/o difficulty. Affect pleasant. ? ? ?Telemetry  ? ?NSR 70s  ? ?Labs  ?  ?CBC ?Recent Labs  ?  11/11/21 ?0120 11/11/21 ?1311 11/11/21 ?1322 11/12/21 ?0251  ?WBC 8.3  --   --  8.1  ?HGB 11.9*   < > 8.8* 10.8*  ?HCT 34.3*   < > 26.0* 31.5*  ?MCV 100.0  --   --  101.0*  ?PLT 279  --   --  255  ? < > = values in this interval not displayed.  ? ?Basic Metabolic Panel ?Recent Labs  ?  11/11/21 ?0120 11/11/21 ?1311 11/11/21 ?1322 11/12/21 ?0251  ?NA 147*   < > 150* 142  ?K 3.0*   < > 2.7* 3.2*  ?CL 112*  --   --  109  ?CO2 30  --   --  27  ?GLUCOSE 110*  --   --  107*  ?BUN 14  --   --  10  ?CREATININE 0.89  --   --  0.93  ?CALCIUM 7.2*  --   --  6.9*  ?MG 1.5*  --   --  1.7  ?PHOS 1.5*  --   --  2.8  ? < > =  values in this interval not displayed.  ? ?Liver Function Tests ?No results for input(s): AST, ALT, ALKPHOS, BILITOT, PROT, ALBUMIN in the last 72 hours. ? ?No results for input(s): LIPASE, AMYLASE in the last 72 hours. ?Cardiac Enzymes ?No results for input(s): CKTOTAL, CKMB, CKMBINDEX, TROPONINI in the last 72 hours. ? ?BNP: ?BNP (last 3 results) ?No results for input(s): BNP in the last 8760 hours. ? ?ProBNP (last 3 results) ?No results for input(s): PROBNP in the last 8760 hours. ? ? ?D-Dimer ?No results for input(s): DDIMER in the last 72 hours. ?Hemoglobin A1C ?No results for input(s): HGBA1C in the last 72 hours. ?Fasting Lipid Panel ?No results for input(s): CHOL, HDL, LDLCALC, TRIG, CHOLHDL, LDLDIRECT in the last 72 hours. ?Thyroid Function Tests ?No results for input(s): TSH, T4TOTAL, T3FREE, THYROIDAB in the last 72 hours. ? ?Invalid input(s): FREET3 ? ?Other results: ? ? ?Imaging  ? ? ?No results found. ? ? ?Medications:   ? ? ?Scheduled Medications: ? amiodarone  200 mg Oral BID  ? apixaban  5 mg Oral BID  ? folic acid  1 mg Oral  Daily  ? mouth rinse  15 mL Mouth Rinse BID  ? multivitamin with minerals  1 tablet Oral Daily  ? sacubitril-valsartan  1 tablet Oral BID  ? sodium chloride flush  3 mL Intravenous Q12H  ? sodium chloride flush  3 mL Intravenous Q12H  ? spironolactone  25 mg Oral Daily  ? thiamine  100 mg Oral Daily  ? Or  ? thiamine  100 mg Intravenous Daily  ? umeclidinium bromide  1 puff Inhalation Daily  ? ? ?Infusions: ? sodium chloride 250 mL (11/12/21 0734)  ? potassium chloride 10 mEq (11/12/21 1347)  ? ? ? ?PRN Medications: ?sodium chloride, acetaminophen, albuterol, ondansetron (ZOFRAN) IV, sodium chloride flush ? ? ? ?Patient Profile  ? ?Mr Girouard is a  61 year old with HTN, emphysema, ETOH abuse,and tobacco abuse.  ?  ?Admitted with symptomatic hypercalcemia. ? ?Assessment/Plan  ? ?1. Hypercalcemia  ?- Treatment and workup per primary team.  ?- Calcium on admit 14.5. Calcium now down to 6.9 ?  ?2. Delirium  ?- ETOH withdrawal. On CIWA protocol.  ?- Resolving. A&Ox3.  ?  ?3. A Fib RVR ?- Developed A fib RVR 11/03/21. Converted on amio drip.  ?- Went back in NSR on 4/22. Amio drip stopped 04/24. ?- Maintaining SR. Continue amiodarone 200 mg BID.  ?- Replace Mag and K aggressively. ?- Anticoagulated with Eliquis ?  ?4.  Acute HFrEF  ?- New reduced EF. Unclear etiology. No previous history of coronary disease.  ?- Echo EF 25%, mid and apical hypokinesis with sparing of base, RV mildly reduced. Echo concerning for Takotsubo. LHC w/ minimal CAD. NICM ETOH abuse/HTN  maybe contributing.  ?- cMRI completed, interpretation pending  ?- Appears euvolemic. Normal filling pressures on RHC yesterday. Wt down 3 lb  ?- Continue Entresto 97-103 mg twice a day.  ?- Continue spiro 25 mg daily ?- Consider SGLT2i in future, worry a bit about hygiene/risk of UTI ?- add low dose Toprol XL 12.5 mg daily  ?- likely resume PO diuretic tomorrow  ?- Daily BMET ? ?5. Hypomagnesia ?-Mag 1.7 ?-Give 4 grams Mag.  ? ?6. Hypokalemia ?- K 3.0  ?- Supp K  and Mg  ? ?7. ETOH abuse ?-In Rehab 10 years ago for ETOH abuse.  ?-Drinks 20 beers every 2 days ?-Withdrawal resolving.  ? ?8. Hypernatremia ?- resolved, Na 150>>142 today  ?-  monitor  ? ? ?Length of Stay: 10 ? ?Robbie Lis, PA-C  ?11/12/2021, 2:03 PM ? ?Advanced Heart Failure Team ?Pager [phone] (M-F; 7a - 5p)  ?Please contact [facility] for night-coverage after hours (5p -7a ) and weekends on [email or web address] ? ? ?Patient seen and examined with the above-signed Advanced Practice Provider and/or Housestaff. I personally reviewed laboratory data, imaging studies and relevant notes. I independently examined the patient and formulated the important aspects of the plan. I have edited the note to reflect any of my changes or salient points. I have personally discussed the plan with the patient and/or family. ? ?Cath results reviewed. He feels better today. No orthopnea or PND. Volume status optimized  ? ?cMRI EF 42% with evidence of potential Tako-tsubo CM  RV normal  ? ?General: Sitting up in bed  No resp difficulty ?HEENT: normal ?Neck: supple. no JVD. Carotids 2+ bilat; no bruits. No lymphadenopathy or thryomegaly appreciated. ?Cor: PMI nondisplaced. Regular rate & rhythm. No rubs, gallops or murmurs. ?Lungs: clear ?Abdomen: soft, nontender, nondistended. No hepatosplenomegaly. No bruits or masses. Good bowel sounds. ?Extremities: no cyanosis, clubbing, rash, edema ?Neuro: alert & orientedx3, cranial nerves grossly intact. moves all 4 extremities w/o difficulty. Affect pleasant ? ?EF improving rapidly. Ready for d/c from HF point of view. Would d/c on  ? ?Entresto 97/103 bid ?Cleda Daub 25 daily ?Toprol 25 daily ?Lasix 40 mg and kcl 20 meq prn for weight gain ?Amio 200 daily ?Eliquis 5 bid  ? ?We will arrange HF f/u.  ? ?Arvilla Meres, MD  ?7:23 PM ? ? ? ?

## 2021-11-13 LAB — BASIC METABOLIC PANEL
Anion gap: 7 (ref 5–15)
BUN: 6 mg/dL — ABNORMAL LOW (ref 8–23)
CO2: 25 mmol/L (ref 22–32)
Calcium: 7 mg/dL — ABNORMAL LOW (ref 8.9–10.3)
Chloride: 109 mmol/L (ref 98–111)
Creatinine, Ser: 0.8 mg/dL (ref 0.61–1.24)
GFR, Estimated: >60 mL/min (ref >60)
Glucose, Bld: 97 mg/dL (ref 70–99)
Potassium: 3.5 mmol/L (ref 3.5–5.1)
Sodium: 141 mmol/L (ref 135–145)

## 2021-11-13 LAB — MAGNESIUM: Magnesium: 1.4 mg/dL — ABNORMAL LOW (ref 1.7–2.4)

## 2021-11-13 LAB — PHOSPHORUS: Phosphorus: 2.7 mg/dL (ref 2.5–4.6)

## 2021-11-13 MED ORDER — POTASSIUM CHLORIDE CRYS ER 20 MEQ PO TBCR
40.0000 meq | EXTENDED_RELEASE_TABLET | Freq: Once | ORAL | Status: AC
  Administered 2021-11-13: 40 meq via ORAL
  Filled 2021-11-13: qty 2

## 2021-11-13 MED ORDER — MAGNESIUM SULFATE 2 GM/50ML IV SOLN
2.0000 g | Freq: Once | INTRAVENOUS | Status: AC
  Administered 2021-11-13: 2 g via INTRAVENOUS
  Filled 2021-11-13: qty 50

## 2021-11-13 MED ORDER — FUROSEMIDE 20 MG PO TABS
20.0000 mg | ORAL_TABLET | Freq: Every day | ORAL | Status: DC
  Administered 2021-11-13 – 2021-11-16 (×4): 20 mg via ORAL
  Filled 2021-11-13 (×4): qty 1

## 2021-11-13 NOTE — Progress Notes (Signed)
? ?Progress Note ? ?Patient Name: Luis Marquez ?Date of Encounter: 11/13/2021 ? ?[facility] Cardiologist: None Dr. Gala Romney has been seen here ? ?Subjective  ? ?Feeling better, no significant shortness of breath.  He states that he needs to learn to take care of himself before taking care of others. ? ?Inpatient Medications  ?  ?Scheduled Meds: ? amiodarone  200 mg Oral BID  ? apixaban  5 mg Oral BID  ? folic acid  1 mg Oral Daily  ? mouth rinse  15 mL Mouth Rinse BID  ? metoprolol succinate  12.5 mg Oral Daily  ? multivitamin with minerals  1 tablet Oral Daily  ? potassium chloride  40 mEq Oral Once  ? sacubitril-valsartan  1 tablet Oral BID  ? sodium chloride flush  3 mL Intravenous Q12H  ? sodium chloride flush  3 mL Intravenous Q12H  ? spironolactone  25 mg Oral Daily  ? thiamine  100 mg Oral Daily  ? Or  ? thiamine  100 mg Intravenous Daily  ? umeclidinium bromide  1 puff Inhalation Daily  ? ?Continuous Infusions: ? sodium chloride 250 mL (11/12/21 0734)  ? magnesium sulfate bolus IVPB 2 g (11/13/21 0831)  ? ?PRN Meds: ?sodium chloride, acetaminophen, albuterol, ondansetron (ZOFRAN) IV, sodium chloride flush  ? ?Vital Signs  ?  ?Vitals:  ? 11/12/21 1552 11/12/21 1638 11/12/21 2038 11/13/21 0458  ?BP:  136/84 115/71 125/77  ?Pulse: 92 83 77 76  ?Resp:   18 18  ?Temp:   98.2 ?F (36.8 ?C) 98.3 ?F (36.8 ?C)  ?TempSrc:   Oral Oral  ?SpO2: 90%  92% 93%  ?Weight:    91.9 kg  ?Height:      ? ? ?Intake/Output Summary (Last 24 hours) at 11/13/2021 0848 ?Last data filed at 11/13/2021 0459 ?Gross per 24 hour  ?Intake 673 ml  ?Output 800 ml  ?Net -127 ml  ? ? ?  11/13/2021  ?  4:58 AM 11/12/2021  ?  4:08 AM 11/11/2021  ?  2:15 AM  ?Last 3 Weights  ?Weight (lbs) 202 lb 9.6 oz 205 lb 9.6 oz 208 lb 12.4 oz  ?Weight (kg) 91.9 kg 93.26 kg 94.7 kg  ?   ? ?Telemetry  ?  ?No adverse arrhythmias- Personally Reviewed ? ?ECG  ?  ?Sinus rhythm- Personally Reviewed ? ?Physical Exam  ? ?GEN: No acute distress.   ?Neck: No JVD ?Cardiac:  RRR, no murmurs, rubs, or gallops.  ?Respiratory: Clear to auscultation bilaterally. ?GI: Soft, nontender, non-distended  ?MS: No edema; No deformity. ?Neuro:  Nonfocal  ?Psych: Normal affect  ? ?Labs  ?  ?High Sensitivity Troponin:   ?Recent Labs  ?Lab 11/08/21 ?3710 11/08/21 ?2115  ?TROPONINIHS 76* 79*  ?   ?Chemistry ?Recent Labs  ?Lab 11/07/21 ?0119 11/08/21 ?6269 11/11/21 ?0120 11/11/21 ?1311 11/11/21 ?1322 11/12/21 ?0251 11/13/21 ?0202  ?NA 142   < > 147*   < > 150* 142 141  ?K 3.2*   < > 3.0*   < > 2.7* 3.2* 3.5  ?CL 100   < > 112*  --   --  109 109  ?CO2 32   < > 30  --   --  27 25  ?GLUCOSE 103*   < > 110*  --   --  107* 97  ?BUN 28*   < > 14  --   --  10 6*  ?CREATININE 1.09   < > 0.89  --   --  0.93 0.80  ?CALCIUM 7.9*   < > 7.2*  --   --  6.9* 7.0*  ?MG 1.5*   < > 1.5*  --   --  1.7 1.4*  ?PROT 4.8*  --   --   --   --   --   --   ?ALBUMIN 2.0*  --   --   --   --   --   --   ?AST 50*  --   --   --   --   --   --   ?ALT 38  --   --   --   --   --   --   ?ALKPHOS 79  --   --   --   --   --   --   ?BILITOT 0.7  --   --   --   --   --   --   ?GFRNONAA >60   < > >60  --   --  >60 >60  ?ANIONGAP 10   < > 5  --   --  6 7  ? < > = values in this interval not displayed.  ?  ?Lipids No results for input(s): CHOL, TRIG, HDL, LABVLDL, LDLCALC, CHOLHDL in the last 168 hours.  ?Hematology ?Recent Labs  ?Lab 11/10/21 ?0045 11/11/21 ?0120 11/11/21 ?1311 11/11/21 ?1315 11/11/21 ?1322 11/12/21 ?0251  ?WBC 9.1 8.3  --   --   --  8.1  ?RBC 3.85* 3.43*  --   --   --  3.12*  ?HGB 12.9* 11.9*   < > 10.2* 8.8* 10.8*  ?HCT 39.2 34.3*   < > 30.0* 26.0* 31.5*  ?MCV 101.8* 100.0  --   --   --  101.0*  ?MCH 33.5 34.7*  --   --   --  34.6*  ?MCHC 32.9 34.7  --   --   --  34.3  ?RDW 13.6 13.6  --   --   --  13.7  ?PLT 315 279  --   --   --  255  ? < > = values in this interval not displayed.  ? ?Thyroid No results for input(s): TSH, FREET4 in the last 168 hours.  ?BNPNo results for input(s): BNP, PROBNP in the last 168 hours.  ?DDimer  No results for input(s): DDIMER in the last 168 hours.  ? ?Radiology  ?  ?CARDIAC CATHETERIZATION ? ?Result Date: 11/11/2021 ?  Prox Cx lesion is 20% stenosed.   Mid LAD lesion is 30% stenosed.   The left ventricular ejection fraction is 25-35% by visual estimate. Findings: Ao = 119/68 (90) LV = 119/17 RA =  3 RV = 27/6 PA = 25/9 (15) PCW = 10 Fick cardiac output/index = 8.2/3.7 PVR = 0.6 WU Ao sat = 94% PA sat = 72%, 73% SVC sat = 74% Assessment: 1. Mild non-obstructive CAD 2. NICM - EF hard to estimate from v-gram but seems to be in the 30-35% range 3. Normal filling pressures with high cardiac output 4. No evidence of intracardiac shunting Plan/Discussion: Medical therapy. Consider repeat limited echo prior to d/c to reassess for EF improvement. Arvilla Meres, MD 1:31 PM ? ?MR CARDIAC MORPHOLOGY W WO CONTRAST ? ?Result Date: 11/12/2021 ?CLINICAL DATA:  51M with acute HFrEF. EF 25%. Cath with minimal CAD EXAM: CARDIAC MRI TECHNIQUE: The patient was scanned on a 1.5 Tesla Siemens magnet. A dedicated cardiac coil was used. Functional imaging was done using [place]  sequences. 2,3, and 4 chamber views were done to assess for RWMA's. Modified Simpson's rule using a short axis stack was used to calculate an ejection fraction on a dedicated work [employer]. The patient received 12 cc of Gadavist. After 10 minutes inversion recovery sequences were used to assess for infiltration and scar tissue. CONTRAST:  12 cc  of Gadavist FINDINGS: Left ventricle: -Normal size -Mild hypertrophy -Mild systolic dysfunction.  Apical hypokinesis -Mild ECV elevation (29% in mid segments, up to 37% in apical segments) -Elevated T2 values in all pical segments. -Basal septal midwall LGE LV EF: 42% (Normal 56-78%) Absolute volumes: LV EDV: (Normal 77-195 mL) LV ESV: 12mL (Normal 19-72 mL) LV SV: 75mL (Normal 51-133 mL) CO: 4.9L/min (Normal 2.8-8.8 L/min) Indexed volumes: LV EDV: 44mL/sq-m (Normal 47-92 mL/sq-m) LV ESV:  75mL/sq-m (Normal 13-30 mL/sq-m) LV SV: 4mL/sq-m (Normal 32-62 mL/sq-m) CI: 2.3L/min/sq-m (Normal 1.7-4.2 L/min/sq-m) Right ventricle: Normal size and low normal systolic function RV EF:  47% (Normal 47-74%) Absolute volumes: RV EDV: (Normal 88-227 mL) RV ESV: 69mL (Normal 23-103 mL) RV SV: 35mL (Normal 52-138 mL) CO: 5.4L/min (Normal 2.8-8.8 L/min) Indexed volumes: RV EDV: 35mL/sq-m (Normal 55-105 mL/sq-m) RV ESV: 57mL/sq-m (Normal 15-43 mL/sq-m) RV SV: 47mL/sq-m (Normal 32-64 mL/sq-m) CI: 2.5L/min/sq-m (Normal 1.7-4.2 L/min/sq-m) Left atrium: Normal size Right atrium: Normal size Mitral valve: No regurgitation Aortic valve: No regurgitation Tricuspid valve: No regurgitation Pulmonic valve: No regurgitation Aorta: Normal proximal ascending aorta Pericardium: Normal IMPRESSION: 1. Normal LV size, mild hypertrophy, and mild systolic dysfunction (EF 42%). Apical hypokinesis 2. Basal septal midwall late gadolinium enhancement, which is a scar pattern seen in nonischemic cardiomyopathies 3.  Normal RV size and low normal systolic function (EF 47%) 4. Elevated myocardial T2 values at all apical segments suggesting myocardial edema 5. Differential diagnosis includes acute myocarditis vs Takotsubo cardiomyopathy. Meets criteria for acute myocarditis given regional elevations in T1/ECV/T2 values and LGE. However, suspect more likely Takotsubo cardiomyopathy given apical wall motion abnormalities, myocardial edema in all apical segments, and only mild troponin elevation. While LGE is often absent in Takotsubo, it can be seen. 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 Electronically Signed   By: Epifanio Lesches M.D.   On: 11/12/2021 18:48   ? ?Cardiac Studies  ? ?R/LHC yesterday w/ mild nonobstructive CAD, normal filling pressures w/ high CO. ? ?  ?R/LHC  ?  Prox Cx lesion is 20% stenosed. ?  Mid LAD lesion is 30% stenosed. ?  The left ventricular ejection fraction is 25-35% by visual estimate. ?  ?Findings: ?  ?Ao =  119/68 (90) ?LV = 119/17 ?RA =  3 ?RV = 27/6 ?PA = 25/9 (15) ?PCW = 10 ?Fick cardiac output/index = 8.2/3.7 ?PVR = 0.6 WU ?Ao sat = 94% ?PA sat = 72%, 73% ?SVC sat = 74% ? ?Cardiac MRI 11/12/2021: ?1. Nor

## 2021-11-13 NOTE — Progress Notes (Signed)
Physical Therapy Treatment ?Patient Details ?Name: Luis Marquez ?MRN: [id number] ?DOB: [birth year] ?Today's Date: 11/13/2021 ? ? ?History of Present Illness 61 y/o male presented to ED on 11/02/21 after fall in parking lot and hitting R hand with urinary incontinence. CT head negative. Admitted for severe electrolyte imbalance, AKI, and ETOH withdrawal. PMH: HTN, alcohol use disorder, OA and IM nail Rt femur 2016. ? ?  ?PT Comments  ? ? Pt pleasant and eager to mobilize as pt would like to be at a supervision level for return home. Pt has low bed and can pull up on bunk above if needed but has difficulty with sit to stand transitions particularly without use of RUE post cath. Pt educated for bil LE HEp and progression to return home. Pt demonstrates decreased awareness of deficits and states lack of assist at home.  ?   ?Recommendations for follow up therapy are one component of a multi-disciplinary discharge planning process, led by the attending physician.  Recommendations may be updated based on patient status, additional functional criteria and insurance authorization. ? ?Follow Up Recommendations ? Home health PT ?  ?  ?Assistance Recommended at Discharge Frequent or constant Supervision/Assistance  ?Patient can return home with the following A lot of help with walking and/or transfers;A little help with bathing/dressing/bathroom;Assistance with cooking/housework;Direct supervision/assist for medications management;Assist for transportation;Help with stairs or ramp for entrance ?  ?Equipment Recommendations ? Rolling walker (2 wheels);BSC/3in1  ?  ?Recommendations for Other Services   ? ? ?  ?Precautions / Restrictions Precautions ?Precautions: Fall ?Precaution Comments: R radial heart cath 4/27  ?  ? ?Mobility ? Bed Mobility ?Overal bed mobility: Needs Assistance ?Bed Mobility: Supine to Sit ?  ?  ?Supine to sit: Min guard, HOB elevated ?  ?  ?General bed mobility comments: HOb 30 degrees with pt able to pivot to  edge without assist ?  ? ?Transfers ?Overall transfer level: Needs assistance ?  ?Transfers: Sit to/from Stand ?Sit to Stand: Mod assist ?  ?  ?  ?  ?  ?General transfer comment: mod assist to rise from bed and recliner with arm rest. Cues for limited use of RUE and requires assist for anterior translation and rise. Performed x 5 from bed and x 3 from chair with cues and assist to control descent to surface ?  ? ?Ambulation/Gait ?Ambulation/Gait assistance: Min guard ?Gait Distance (Feet): 240 Feet ?Assistive device: Rolling walker (2 wheels) ?Gait Pattern/deviations: Step-through pattern, Decreased stride length ?  ?Gait velocity interpretation: 1.31 - 2.62 ft/sec, indicative of limited community ambulator ?  ?General Gait Details: pt with decreased stance time RLE due to chronic pain, increased gait tolerance with pt able to self-regulate distance ? ? ?Stairs ?  ?  ?  ?  ?  ? ? ?Wheelchair Mobility ?  ? ?Modified Rankin (Stroke Patients Only) ?  ? ? ?  ?Balance Overall balance assessment: Needs assistance ?Sitting-balance support: No upper extremity supported, Feet supported ?Sitting balance-Leahy Scale: Fair ?Sitting balance - Comments: static sitting without UE support ?  ?Standing balance support: Bilateral upper extremity supported, Reliant on assistive device for balance ?Standing balance-Leahy Scale: Poor ?Standing balance comment: RW for standing ?  ?  ?  ?  ?  ?  ?  ?  ?  ?  ?  ?  ? ?  ?Cognition Arousal/Alertness: Awake/alert ?Behavior During Therapy: [facility] for tasks assessed/performed ?Overall Cognitive Status: Impaired/Different from baseline ?Area of Impairment: Problem solving, Memory ?  ?  ?  ?  ?  ?  ?  ?  ?  ?  ?  Memory: Decreased short-term memory ?Following Commands: Follows one step commands consistently ?  ?  ?Problem Solving: Slow processing ?General Comments: pt needing repeated cues for sequence for transfer to stand and precautions with RUE, linens soaked and was unaware ?  ?  ? ?  ?Exercises  General Exercises - Lower Extremity ?Long Arc Quad: AROM, Both, Seated, 20 reps ?Hip Flexion/Marching: AROM, Both, 15 reps, Seated ? ?  ?General Comments   ?  ?  ? ?Pertinent Vitals/Pain    ? ? ?Home Living   ?  ?  ?  ?  ?  ?  ?  ?  ?  ?   ?  ?Prior Function    ?  ?  ?   ? ?PT Goals (current goals can now be found in the care plan section) Progress towards PT goals: Progressing toward goals ? ?  ?Frequency ? ? ? Min 3X/week ? ? ? ?  ?PT Plan Current plan remains appropriate  ? ? ?Co-evaluation   ?  ?  ?  ?  ? ?  ?AM-PAC PT "6 Clicks" Mobility   ?Outcome Measure ? Help needed turning from your back to your side while in a flat bed without using bedrails?: A Little ?Help needed moving from lying on your back to sitting on the side of a flat bed without using bedrails?: A Little ?Help needed moving to and from a bed to a chair (including a wheelchair)?: A Lot ?Help needed standing up from a chair using your arms (e.g., wheelchair or bedside chair)?: A Lot ?Help needed to walk in hospital room?: A Little ?Help needed climbing 3-5 steps with a railing? : A Lot ?6 Click Score: 15 ? ?  ?End of Session   ?Activity Tolerance: Patient tolerated treatment well ?Patient left: in chair;with call bell/phone within reach;with chair alarm set ?Nurse Communication: Mobility status ?PT Visit Diagnosis: Muscle weakness (generalized) (M62.81);Other abnormalities of gait and mobility (R26.89) ?  ? ? ?Time: 1203-1228 ?PT Time Calculation (min) (ACUTE ONLY): 25 min ? ?Charges:  $Gait Training: 8-22 mins ?$Therapeutic Exercise: 8-22 mins          ?          ? ?Dysen Edmondson P, PT ?Acute Rehabilitation Services ?Pager: [phone] ?Office: [phone] ? ? ? ?Renaye Janicki B Stanford Strauch ?11/13/2021, 1:34 PM ? ?

## 2021-11-13 NOTE — Telephone Encounter (Signed)
erroneous

## 2021-11-13 NOTE — Progress Notes (Signed)
? ?  Subjective:  ? ?No acute events overnight. Reports he is feeling well, but still very weak. He lives with four other men but states they do not interact much. Patient largely completes ADL's and IADL's independently. He does not feel as though he has the strength to go home by himself at this time. ? ?Objective: ? ?Vital signs in last 24 hours: ?Vitals:  ? 11/12/21 1552 11/12/21 1638 11/12/21 2038 11/13/21 0458  ?BP:  136/84 115/71 125/77  ?Pulse: 92 83 77 76  ?Resp:   18 18  ?Temp:   98.2 ?F (36.8 ?C) 98.3 ?F (36.8 ?C)  ?TempSrc:   Oral Oral  ?SpO2: 90%  92% 93%  ?Weight:    91.9 kg  ?Height:      ? ?General: Resting comfortably in bed, no acute distress ?CV: Regular rate, rhythm. No murmurs appreciated. Warm extremities. ?Pulm: Normal respiratory effort.  ?Neuro: Awake, alert, conversing appropriately. No focal deficits. ? ?Assessment/Plan: ? ?Luis Marquez is 61yo person (he/him) with emphysema, prior tobacco use admitted 4/18 with hypercalcemia with hospital course complicated by acute alcohol withdrawal, new atrial fibrillation with RVR, and newly diagnosed heart failure with reduced ejection fraction. He is currently working with PT/OT to gain strength to return home.  ? ?Principal Problem: ?  Hypercalcemia ?Active Problems: ?  Delirium tremens (HCC) ?  Hypokalemia ?  Solitary pulmonary nodule ?  Hand laceration ?  Essential hypertension ?  Alcohol withdrawal (HCC) ?  AKI (acute kidney injury) (HCC) ?  Atrial fibrillation with RVR (HCC) ?  Acute HFrEF (heart failure with reduced ejection fraction) (HCC) ? 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 Cardiac MRI yesterday revealed possible acute myocarditis vs Takotsubo cardiomyopathy. Given current clinical picture, Takotsubo more likely. MRI also revealed improved EF to 42% from  25% on Echo. Will continue with GDMT, appreciate cardiology recommendations. ?- Entresto 97-103 twice daily ?- Metoprolol succinate 12.5mg  daily ?- Spironolactone 25mg  daily ?- PO furosemide 20mg  daily ?- Per cards, r/p Echo in 2-64mo ? ?#Physical deconditioning ?Luis Marquez lives with four other men, although each live independently. He states he only relies on himself for his ADL's and IADL's. Currently states he cannot get himself out of bed by himself, making this an unsafe discharge plan. Will discuss options with CSW tomorrow. ? ?#Atrial fibrillation with RVR ?Patient in normal sinus rhythm, rate controlled. Will continue amiodarone and anticoagulation. ?- Amiodarone 200mg  daily ?- Eliquis 5mg  twice daily ? ?#History hypercalcemia ?Patient originally admitted for hypercalcemia without clear cause. Current differential includes lytic bone disease vs MM vs paraneoplastic syndrome. Will likely need PET scan as outpatient.  ?- PET scan as outpatient ?- Daily BMP ? ?#Alcohol use disorder ?Patient no longer experiencing withdrawal symptoms. Mentation at baseline. Patient agreeable to initiating naltrexone at discharge. ? ?DIET: HH ?IVF: n/a ?DVT PPX: Eliquis ?BOWEL: n/a ?CODE: FULL ?FAM COM: n/a ? ?Prior to Admission Living Arrangement: Home ?Anticipated Discharge Location: ?Home ?Dispo: Anticipated discharge in approximately 1-2 day(s).  ? 0mo, MD ?11/13/2021, 3:01 PM ?Pager: [phone] ?After 5pm on weekdays and 1pm on weekends: On Call pager [phone]  ?

## 2021-11-14 LAB — BASIC METABOLIC PANEL
Anion gap: 4 — ABNORMAL LOW (ref 5–15)
BUN: 7 mg/dL — ABNORMAL LOW (ref 8–23)
CO2: 27 mmol/L (ref 22–32)
Calcium: 7.1 mg/dL — ABNORMAL LOW (ref 8.9–10.3)
Chloride: 111 mmol/L (ref 98–111)
Creatinine, Ser: 0.84 mg/dL (ref 0.61–1.24)
GFR, Estimated: >60 mL/min (ref >60)
Glucose, Bld: 118 mg/dL — ABNORMAL HIGH (ref 70–99)
Potassium: 3.3 mmol/L — ABNORMAL LOW (ref 3.5–5.1)
Sodium: 142 mmol/L (ref 135–145)

## 2021-11-14 LAB — MAGNESIUM: Magnesium: 1.4 mg/dL — ABNORMAL LOW (ref 1.7–2.4)

## 2021-11-14 LAB — PHOSPHORUS: Phosphorus: 3 mg/dL (ref 2.5–4.6)

## 2021-11-14 MED ORDER — MAGNESIUM SULFATE 4 GM/100ML IV SOLN
4.0000 g | Freq: Once | INTRAVENOUS | Status: AC
  Administered 2021-11-14: 4 g via INTRAVENOUS
  Filled 2021-11-14: qty 100

## 2021-11-14 MED ORDER — MAGNESIUM SULFATE 2 GM/50ML IV SOLN
2.0000 g | Freq: Once | INTRAVENOUS | Status: AC
  Administered 2021-11-14: 2 g via INTRAVENOUS
  Filled 2021-11-14: qty 50

## 2021-11-14 MED ORDER — POTASSIUM CHLORIDE 20 MEQ PO PACK
40.0000 meq | PACK | Freq: Two times a day (BID) | ORAL | Status: AC
  Administered 2021-11-14 (×2): 40 meq via ORAL
  Filled 2021-11-14 (×2): qty 2

## 2021-11-14 NOTE — Progress Notes (Signed)
? ?Subjective: ?No acute overnight events.  ? ?Patient was seen at bedside during rounds today. Pt reports feeling well and moving around. Continues to work with PT.  ? ?Pt is updated on the plan for today, and all questions and concerns are addressed.  ? ?Objective: ? ?Vital signs in last 24 hours: ?Vitals:  ? 11/13/21 0458 11/13/21 2045 11/14/21 0436 11/14/21 0850  ?BP: 125/77 125/83 133/83 120/72  ?Pulse: 76 79 76 80  ?Resp: 18 19 18    ?Temp: 98.3 ?F (36.8 ?C) 98.5 ?F (36.9 ?C) 98.3 ?F (36.8 ?C)   ?TempSrc: Oral Oral    ?SpO2: 93% 93% 93% 93%  ?Weight: 91.9 kg  89.9 kg   ?Height:      ? ?Constitutional: well-appearing, NAD ?Neck: No JVD appreciated  ?Cardiovascular: regular rate and rhythm, no m/r/g. Non-edematous lower extremities.  ?Pulmonary/Chest: normal work of breathing on room air, lungs clear to auscultation bilaterally ?Neurological: alert & oriented x 3, follows commands  ?Skin: warm and dry ?Psych: Normal mood and affect ? ?Assessment/Plan: ? ?Principal Problem: ?  Hypercalcemia ?Active Problems: ?  Delirium tremens (HCC) ?  Hypokalemia ?  Solitary pulmonary nodule ?  Hand laceration ?  Essential hypertension ?  Alcohol withdrawal (HCC) ?  AKI (acute kidney injury) (HCC) ?  Atrial fibrillation with RVR (HCC) ?  Acute HFrEF (heart failure with reduced ejection fraction) (HCC) ? ?Luis Marquez is a 61 y.o. with a pertinent PMH of emphysema, prior tobacco use, who presented with a fall and admitted 4/18 for hypercalcemia with hospital course complicated by acute alcohol withdrawal, new atrial fibrillation with RVR, and newly diagnosed HFrEF. He is currently working with PT/OT to gain strength return home on HD#13.  ? ?#Newly diagnosed HFrEF (25%) concerning for Takotsubo  ?#Nonischemic cardiomyopathy likely secondary to hypertension and EtOH ?#Non-obstructive CAD ?EF improved on 4/28 MRI from 25% to 42%. Minimal CAD on cath. Nonischemic cardiomyopathy possibly related to alcohol use and hypertension.  Appears euvolemic on exam. Cardiology recommends repeating echo in 2 to 3 months.  ?- Entresto 97-103 twice daily ?- Metoprolol succinate 12.5mg  daily ?- Spironolactone 25mg  daily ?- PO furosemide 20mg  daily ?- Per cards, r/p Echo in 2-89mo and can consider SGLT2i int he future  ?  ?#Physical deconditioning ?Unable to safely stand on his own and does not have support at home. Medicaid application is pending, and thus unable to go to SNF at this time. Continuing to work with case management and social work for options.  ?- Continue PT while admitted  ?  ?#Atrial fibrillation with RVR, resolved  ?Developed A-fib with RVR on 4/19 and was started on amiodarone drip. Maintaining SR with p.o. amiodarone.  He will continue loading dose until May 15 when he can start daily dosing. ?- Amiodarone 200mg  BID, until May 15 then qd dosing ?- Apixaban 5mg  twice daily ?  ?#History of hypercalcemia ?Ca 14.5 on admission, with PTH low. Tx'd with 1 dose of zoledronic acid on 4/18 and two doses of calcitonin. Ca 7.3 today. Ddx include lytic disease versus malignancy causing hypercalcemia. PSA within normal limits, PTH RP was negative, TSH within normal limits. CT showed pulmonary nodules in right upper and lower lung. ?- PET scan as outpatient ?- Daily BMP ?  ?#Alcohol use disorder ?Patient is at baseline.  ?-Naltrexone at discharge ? ?#Hypomagnesemia  ?Mag 1.7; repleted  ? ?#Hypokalemia  ?Stable; replete as needed  ? ? ?Diet: Heart Healthy ?VTE:  Apixaban ?Code: Full ? ?May 17, MD  ?  Internal Medicine Resident, PGY-1 ?Pager: [phone] ?After 5pm on weekdays and 1pm on weekends: On Call pager [phone] ?

## 2021-11-14 NOTE — Plan of Care (Signed)
  Problem: Education: Goal: Knowledge of General Education information will improve Description Including pain rating scale, medication(s)/side effects and non-pharmacologic comfort measures Outcome: Progressing   Problem: Health Behavior/Discharge Planning: Goal: Ability to manage health-related needs will improve Outcome: Progressing   Problem: Clinical Measurements: Goal: Ability to maintain clinical measurements within normal limits will improve Outcome: Progressing Goal: Will remain free from infection Outcome: Progressing Goal: Diagnostic test results will improve Outcome: Progressing Goal: Respiratory complications will improve Outcome: Progressing Goal: Cardiovascular complication will be avoided Outcome: Progressing   Problem: Activity: Goal: Risk for activity intolerance will decrease Outcome: Progressing   Problem: Elimination: Goal: Will not experience complications related to bowel motility Outcome: Progressing Goal: Will not experience complications related to urinary retention Outcome: Progressing   Problem: Safety: Goal: Ability to remain free from injury will improve Outcome: Progressing   

## 2021-11-14 NOTE — Progress Notes (Signed)
? ? ?HD#12 ?Subjective:  ?Overnight Events: None ? ?Patient assessed at bedside this AM.  He states that he has been working with physical therapy, but continues to have difficulty with standing up without assistance.  We talked about need for following up with primary care physician and cardiologist. ? ?Objective:  ?Vital signs in last 24 hours: ?Vitals:  ? 11/13/21 0458 11/13/21 2045 11/14/21 0436 11/14/21 0850  ?BP: 125/77 125/83 133/83 120/72  ?Pulse: 76 79 76 80  ?Resp: 18 19 18    ?Temp: 98.3 ?F (36.8 ?C) 98.5 ?F (36.9 ?C) 98.3 ?F (36.8 ?C)   ?TempSrc: Oral Oral    ?SpO2: 93% 93% 93% 93%  ?Weight: 91.9 kg  89.9 kg   ?Height:      ? ?Supplemental O2: Room Air ?SpO2: 93 % ?O2 Flow Rate (L/min): 2 L/min ? ? ?Physical Exam:  ?Constitutional: well-appearing, sitting up in bed watching TV ?HENT: normocephalic ?Neck: No JVD ?Cardiovascular: regular rate and rhythm, no m/r/g ?Pulmonary/Chest: normal work of breathing on room air, lungs clear to auscultation bilaterally ?Neurological: alert & oriented x 3 ?Skin: warm and dry ?Psych: Normal mood and affect ? ?Filed Weights  ? 11/12/21 0408 11/13/21 0458 11/14/21 0436  ?Weight: 93.3 kg 91.9 kg 89.9 kg  ? ? ? ?Intake/Output Summary (Last 24 hours) at 11/14/2021 1031 ?Last data filed at 11/14/2021 11/16/2021 ?Gross per 24 hour  ?Intake 720 ml  ?Output 975 ml  ?Net -255 ml  ? ?Net IO Since Admission: -4,153.77 mL [11/14/21 1031] ? ?Pertinent Labs: ? ?  Latest Ref Rng & Units 11/12/2021  ?  2:51 AM 11/11/2021  ?  1:22 PM 11/11/2021  ?  1:15 PM  ?CBC  ?WBC 4.0 - 10.5 K/uL 8.1      ?Hemoglobin 13.0 - 17.0 g/dL 11/13/2021   8.8   28.7    ?Hematocrit 39.0 - 52.0 % 31.5   26.0   30.0    ?Platelets 150 - 400 K/uL 255      ? ? ? ?  Latest Ref Rng & Units 11/14/2021  ?  3:21 AM 11/13/2021  ?  2:02 AM 11/12/2021  ?  2:51 AM  ?CMP  ?Glucose 70 - 99 mg/dL 11/14/2021   97   672    ?BUN 8 - 23 mg/dL 7   6   10     ?Creatinine 0.61 - 1.24 mg/dL 094     7.09    ?Sodium 135 - 145 mmol/L 142   141   142     ?Potassium 3.5 - 5.1 mmol/L 3.3   3.5   3.2    ?Chloride 98 - 111 mmol/L 111   109   109    ?CO2 22 - 32 mmol/L 27   25   27     ?Calcium 8.9 - 10.3 mg/dL 7.1   7.0   6.9    ? ? ?Imaging: ?No results found. ? ?Assessment/Plan:  ? ?Principal Problem: ?  Hypercalcemia ?Active Problems: ?  Delirium tremens (HCC) ?  Hypokalemia ?  Solitary pulmonary nodule ?  Hand laceration ?  Essential hypertension ?  Alcohol withdrawal (HCC) ?  AKI (acute kidney injury) (HCC) ?  Atrial fibrillation with RVR (HCC) ?  Acute HFrEF (heart failure with reduced ejection fraction) (HCC) ? ? ?Patient Summary: ?Luis Marquez is a 61 y.o. with a pertinent PMH of emphysema, prior tobacco use, who presented with a fall and admitted 4/18 for hypercalcemia with hospital course complicated by acute alcohol  withdrawal, new atrial fibrillation with RVR, and newly diagnosed heart failure with reduced ejection fraction.  He is currently working with PT/OT to gain strength return home on HD#12. .  ? ?Plan: ?#Newly diagnosed heart failure with reduced ejection fraction ?#Nonischemic cardiomyopathy likely secondary to hypertension, EtOH ?#Non-obstructive coronary artery disease ?Ejection fraction was improved on MRI on 4/28 to 42% from 25%.  Good 0 cardiomyopathy is highly suspected for reduced EF.  Cardiology recommends repeating echo in 2 to 3 months. ?- Entresto 97-103 twice daily ?- Metoprolol succinate 12.5mg  daily ?- Spironolactone 25mg  daily ?- PO furosemide 20mg  daily ?- Per cards, r/p Echo in 2-8mo ? ?#Physical deconditioning ?Patient lives with 4 other individuals, but does not have support from them.  He is not able to safely stand on his own.  Medicaid application is pending, but patient is not currently able to go to a skilled nursing facility to regain strength.  I will reach out to case management and social work to see if other options might be available for patient.  I will also call patient's brother who lives in  today. ?-Recommendation for home health PT, rolling walker, and bedside commode ? ?#Atrial fibrillation with RVR ?He developed A-fib with RVR on 4/19 and was started on amiodarone drip.  He is maintaining sinus rhythm with p.o. amiodarone.  He will continue loading dose until May 15 when he can start daily dosing. ?- Amiodarone 200mg  BID, until May 15 then qd dosing ?- Apixaban 5mg  twice daily ? ?#History of hypercalcemia ?PTH low, differentials include lytic disease versus malignancy causing hypercalcemia.  PSA within normal limits, PTH RP was negative, TSH within normal limits.  CT showed pulmonary nodules in right upper and lower lung. ?- PET scan as outpatient ?- Daily BMP ? ?#Alcohol use disorder ?Patient is at baseline.  ?-Naltrexone at discharge ? ?Diet: Heart Healthy ?VTE:  Apixaban ?Code: Full ?PT/OT recs: Home Health, bedside commode and walker. ?TOC recs: medicaid application pending ?Family Update: Will call May 17, patient's brother, this afternoon ? ? ?Dispo: Anticipated discharge to Home with home health in 1-2 days pending working with PT/OT to regain strength.  ? ? . Rennie Hack, D.O.  ?Internal Medicine Resident, PGY-1 ?[facility] Internal Medicine Res[facility]  ?Pager: [phone] ?10:31 AM, 11/14/2021  ? ?**Please contact the on call pager after 5 pm and on weekends at [phone].** ? ?   ? ?

## 2021-11-14 NOTE — Progress Notes (Signed)
? ?Progress Note ? ?Patient Name: Luis Marquez ?Date of Encounter: 11/14/2021 ? ?[facility] Cardiologist: None Dr. Gala Romney has been seen here ? ?Subjective  ? ?No significant shortness of breath at this time. ? ?Inpatient Medications  ?  ?Scheduled Meds: ? amiodarone  200 mg Oral BID  ? apixaban  5 mg Oral BID  ? folic acid  1 mg Oral Daily  ? furosemide  20 mg Oral Daily  ? mouth rinse  15 mL Mouth Rinse BID  ? metoprolol succinate  12.5 mg Oral Daily  ? multivitamin with minerals  1 tablet Oral Daily  ? potassium chloride  40 mEq Oral BID  ? sacubitril-valsartan  1 tablet Oral BID  ? sodium chloride flush  3 mL Intravenous Q12H  ? sodium chloride flush  3 mL Intravenous Q12H  ? spironolactone  25 mg Oral Daily  ? thiamine  100 mg Oral Daily  ? Or  ? thiamine  100 mg Intravenous Daily  ? umeclidinium bromide  1 puff Inhalation Daily  ? ?Continuous Infusions: ? sodium chloride 250 mL (11/12/21 0734)  ? magnesium sulfate bolus IVPB    ? ?PRN Meds: ?sodium chloride, acetaminophen, albuterol, ondansetron (ZOFRAN) IV, sodium chloride flush  ? ?Vital Signs  ?  ?Vitals:  ? 11/13/21 0458 11/13/21 2045 11/14/21 0436 11/14/21 0850  ?BP: 125/77 125/83 133/83 120/72  ?Pulse: 76 79 76 80  ?Resp: 18 19 18    ?Temp: 98.3 ?F (36.8 ?C) 98.5 ?F (36.9 ?C) 98.3 ?F (36.8 ?C)   ?TempSrc: Oral Oral    ?SpO2: 93% 93% 93% 93%  ?Weight: 91.9 kg  89.9 kg   ?Height:      ? ? ?Intake/Output Summary (Last 24 hours) at 11/14/2021 1011 ?Last data filed at 11/14/2021 11/16/2021 ?Gross per 24 hour  ?Intake 720 ml  ?Output 975 ml  ?Net -255 ml  ? ? ?  11/14/2021  ?  4:36 AM 11/13/2021  ?  4:58 AM 11/12/2021  ?  4:08 AM  ?Last 3 Weights  ?Weight (lbs) 198 lb 3.1 oz 202 lb 9.6 oz 205 lb 9.6 oz  ?Weight (kg) 89.9 kg 91.9 kg 93.26 kg  ?   ? ?Telemetry  ?  ?No adverse arrhythmias currently sinus personally Reviewed ? ?ECG  ?  ?Sinus rhythm- Personally Reviewed ? ?Physical Exam  ? ?GEN: No acute distress.   ?Neck: No JVD ?Cardiac: RRR, no murmurs, rubs, or  gallops.  ?Respiratory: Clear to auscultation bilaterally. ?GI: Soft, nontender, non-distended  ?MS: No edema; No deformity. ?Neuro:  Nonfocal  ?Psych: Normal affect  ? ?Labs  ?  ?High Sensitivity Troponin:   ?Recent Labs  ?Lab 11/08/21 ?11/10/21 11/08/21 ?2115  ?TROPONINIHS 76* 79*  ?   ?Chemistry ?Recent Labs  ?Lab 11/12/21 ?0251 11/13/21 ?0202 11/14/21 ?0321  ?NA 142 141 142  ?K 3.2* 3.5 3.3*  ?CL 109 109 111  ?CO2 27 25 27   ?GLUCOSE 107* 97 118*  ?BUN 10 6* 7*  ?CREATININE 0.93 0.80 0.84  ?CALCIUM 6.9* 7.0* 7.1*  ?MG 1.7 1.4* 1.4*  ?GFRNONAA >60 >60 >60  ?ANIONGAP 6 7 4*  ?  ?Lipids No results for input(s): CHOL, TRIG, HDL, LABVLDL, LDLCALC, CHOLHDL in the last 168 hours.  ?Hematology ?Recent Labs  ?Lab 11/10/21 ?0045 11/11/21 ?0120 11/11/21 ?1311 11/11/21 ?1315 11/11/21 ?1322 11/12/21 ?0251  ?WBC 9.1 8.3  --   --   --  8.1  ?RBC 3.85* 3.43*  --   --   --  3.12*  ?  HGB 12.9* 11.9*   < > 10.2* 8.8* 10.8*  ?HCT 39.2 34.3*   < > 30.0* 26.0* 31.5*  ?MCV 101.8* 100.0  --   --   --  101.0*  ?MCH 33.5 34.7*  --   --   --  34.6*  ?MCHC 32.9 34.7  --   --   --  34.3  ?RDW 13.6 13.6  --   --   --  13.7  ?PLT 315 279  --   --   --  255  ? < > = values in this interval not displayed.  ? ?Thyroid No results for input(s): TSH, FREET4 in the last 168 hours.  ?BNPNo results for input(s): BNP, PROBNP in the last 168 hours.  ?DDimer No results for input(s): DDIMER in the last 168 hours.  ? ?Radiology  ?  ?MR CARDIAC MORPHOLOGY W WO CONTRAST ? ?Result Date: 11/12/2021 ?CLINICAL DATA:  32M with acute HFrEF. EF 25%. Cath with minimal CAD EXAM: CARDIAC MRI TECHNIQUE: The patient was scanned on a 1.5 Tesla Siemens magnet. A dedicated cardiac coil was used. Functional imaging was done using Fiesta sequences. 2,3, and 4 chamber views were done to assess for RWMA's. Modified Simpson's rule using a short axis stack was used to calculate an ejection fraction on a dedicated work [employer]. The patient received 12 cc of  Gadavist. After 10 minutes inversion recovery sequences were used to assess for infiltration and scar tissue. CONTRAST:  12 cc  of Gadavist FINDINGS: Left ventricle: -Normal size -Mild hypertrophy -Mild systolic dysfunction.  Apical hypokinesis -Mild ECV elevation (29% in mid segments, up to 37% in apical segments) -Elevated T2 values in all pical segments. -Basal septal midwall LGE LV EF: 42% (Normal 56-78%) Absolute volumes: LV EDV: (Normal 77-195 mL) LV ESV: 87mL (Normal 19-72 mL) LV SV: 30mL (Normal 51-133 mL) CO: 4.9L/min (Normal 2.8-8.8 L/min) Indexed volumes: LV EDV: 88mL/sq-m (Normal 47-92 mL/sq-m) LV ESV: 54mL/sq-m (Normal 13-30 mL/sq-m) LV SV: 34mL/sq-m (Normal 32-62 mL/sq-m) CI: 2.3L/min/sq-m (Normal 1.7-4.2 L/min/sq-m) Right ventricle: Normal size and low normal systolic function RV EF:  47% (Normal 47-74%) Absolute volumes: RV EDV: (Normal 88-227 mL) RV ESV: 28mL (Normal 23-103 mL) RV SV: 42mL (Normal 52-138 mL) CO: 5.4L/min (Normal 2.8-8.8 L/min) Indexed volumes: RV EDV: 31mL/sq-m (Normal 55-105 mL/sq-m) RV ESV: 15mL/sq-m (Normal 15-43 mL/sq-m) RV SV: 25mL/sq-m (Normal 32-64 mL/sq-m) CI: 2.5L/min/sq-m (Normal 1.7-4.2 L/min/sq-m) Left atrium: Normal size Right atrium: Normal size Mitral valve: No regurgitation Aortic valve: No regurgitation Tricuspid valve: No regurgitation Pulmonic valve: No regurgitation Aorta: Normal proximal ascending aorta Pericardium: Normal IMPRESSION: 1. Normal LV size, mild hypertrophy, and mild systolic dysfunction (EF 42%). Apical hypokinesis 2. Basal septal midwall late gadolinium enhancement, which is a scar pattern seen in nonischemic cardiomyopathies 3.  Normal RV size and low normal systolic function (EF 47%) 4. Elevated myocardial T2 values at all apical segments suggesting myocardial edema 5. Differential diagnosis includes acute myocarditis vs Takotsubo cardiomyopathy. Meets criteria for acute myocarditis given regional elevations in T1/ECV/T2 values and  LGE. However, suspect more likely Takotsubo cardiomyopathy given apical wall motion abnormalities, myocardial edema in all apical segments, and only mild troponin elevation. While LGE is often absent in Takotsubo, it can be seen. 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 Electronically Signed   By: Epifanio Lesches M.D.   On: 11/12/2021 18:48   ? ?Cardiac Studies  ? ?R/LHC yesterday w/ mild nonobstructive CAD, normal filling pressures w/ high CO. ? ?  ?R/LHC  ?  Prox Cx lesion is 20% stenosed. ?  Mid LAD lesion is 30% stenosed. ?  The left ventricular ejection fraction is 25-35% by visual estimate. ?  ?Findings: ?  ?Ao = 119/68 (90) ?LV = 119/17 ?RA =  3 ?RV = 27/6 ?PA = 25/9 (15) ?PCW = 10 ?Fick cardiac output/index = 8.2/3.7 ?PVR = 0.6 WU ?Ao sat = 94% ?PA sat = 72%, 73% ?SVC sat = 74% ? ?Cardiac MRI 11/12/2021: ?1. Normal LV size, mild hypertrophy, and mild systolic dysfunction ?(EF 42%). Apical hypokinesis ?  ?2. Basal septal midwall late gadolinium enhancement, which is a scar ?pattern seen in nonischemic cardiomyopathies ?  ?3.  Normal RV size and low normal systolic function (EF 47%) ?  ?4. Elevated myocardial T2 values at all apical segments suggesting ?myocardial edema ?  ?5. Differential diagnosis includes acute myocarditis vs Takotsubo ?cardiomyopathy. Meets criteria for acute myocarditis given regional ?elevations in T1/ECV/T2 values and LGE. However, suspect more likely ?Takotsubo cardiomyopathy given apical wall motion abnormalities, ?myocardial edema in all apical segments, and only mild troponin ?elevation. While LGE is often absent in Takotsubo, it can be seen. 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 ? ?Patient Profile  ?   ?61 y.o. male with mild nonobstructive coronary disease, nonischemic cardiomyopathy with EF in the 30 to 35% range with normal filling pressures high cardiac output admitted with symptomatic hypercalcemia with hypertension emphysema alcohol use and tobacco use ? ?Assessment & Plan  ?   ?Acute HFrEF ?- Newly reduced EF, echo showed EF of 25% concerning for possible Takotsubo.  Minimal CAD on cath.  Nonischemic cardiomyopathy possibly related to alcohol use and hypertension.  Cardiac MRI results are

## 2021-11-15 ENCOUNTER — Other Ambulatory Visit (HOSPITAL_COMMUNITY): Payer: Self-pay

## 2021-11-15 LAB — BASIC METABOLIC PANEL
Anion gap: 5 (ref 5–15)
BUN: 5 mg/dL — ABNORMAL LOW (ref 8–23)
CO2: 26 mmol/L (ref 22–32)
Calcium: 7.3 mg/dL — ABNORMAL LOW (ref 8.9–10.3)
Chloride: 109 mmol/L (ref 98–111)
Creatinine, Ser: 0.82 mg/dL (ref 0.61–1.24)
GFR, Estimated: >60 mL/min (ref >60)
Glucose, Bld: 105 mg/dL — ABNORMAL HIGH (ref 70–99)
Potassium: 3.8 mmol/L (ref 3.5–5.1)
Sodium: 140 mmol/L (ref 135–145)

## 2021-11-15 LAB — PHOSPHORUS: Phosphorus: 2.9 mg/dL (ref 2.5–4.6)

## 2021-11-15 LAB — MAGNESIUM: Magnesium: 1.7 mg/dL (ref 1.7–2.4)

## 2021-11-15 MED ORDER — MAGNESIUM SULFATE 2 GM/50ML IV SOLN
2.0000 g | Freq: Once | INTRAVENOUS | Status: AC
  Administered 2021-11-15: 2 g via INTRAVENOUS
  Filled 2021-11-15: qty 50

## 2021-11-15 NOTE — Progress Notes (Signed)
Occupational Therapy Treatment ?Patient Details ?Name: Luis Marquez ?MRN: [id number] ?DOB: 1961-01-05 ?Today's Date: 11/15/2021 ? ? ?History of present illness 61 y/o male presented to ED on 11/02/21 after fall in parking lot and hitting R hand with urinary incontinence. CT head negative. Admitted for severe electrolyte imbalance, AKI, and ETOH withdrawal. PMH: HTN, alcohol use disorder, OA and IM nail Rt femur 2016. ?  ?OT comments ? Patient continues to make steady progress towards goals in skilled OT session. Patient's session encompassed  pillbox test assessment. Patient completing pillbox with no errors (minimal extra time 8 minutes 24 seconds), and demonstrating divided attention by completing pillbox while simultaneously holding a conversation with OT. OT will continue to assess cognition and ADLs to improve overall level of independence.   ? ?Recommendations for follow up therapy are one component of a multi-disciplinary discharge planning process, led by the attending physician.  Recommendations may be updated based on patient status, additional functional criteria and insurance authorization. ?   ?Follow Up Recommendations ? Home health OT  ?  ?Assistance Recommended at Discharge PRN  ?Patient can return home with the following ? A lot of help with walking and/or transfers;A lot of help with bathing/dressing/bathroom;Assistance with cooking/housework;Direct supervision/assist for financial management;Assist for transportation;Help with stairs or ramp for entrance ?  ?Equipment Recommendations ? Other (comment) (Will continue to assess, will need adaptive equipment most likely)  ?  ?Recommendations for Other Services   ? ?  ?Precautions / Restrictions Precautions ?Precautions: Fall ?Precaution Comments: R radial heart cath 4/27  ? ? ?  ? ?Mobility Bed Mobility ?  ?  ?  ?  ?  ?  ?  ?General bed mobility comments: Declined in session ?  ? ?Transfers ?  ?  ?  ?  ?  ?  ?  ?  ?  ?General transfer comment: Declined in  session ?  ?  ?Balance   ?  ?  ?  ?  ?  ?  ?  ?  ?  ?  ?  ?  ?  ?  ?  ?  ?  ?  ?   ? ?ADL either performed or assessed with clinical judgement  ? ?ADL   ?  ?  ?  ?  ?  ?  ?  ?  ?  ?  ?  ?  ?  ?  ?  ?  ?  ?  ?  ?General ADL Comments: Session focus on pillbox test ?  ? ?Extremity/Trunk Assessment   ?  ?  ?  ?  ?  ? ?Vision   ?  ?  ?Perception   ?  ?Praxis   ?  ? ?Cognition Arousal/Alertness: Awake/alert ?Behavior During Therapy: [facility] for tasks assessed/performed ?Overall Cognitive Status: Within Functional Limits for tasks assessed ?  ?  ?  ?  ?  ?  ?  ?  ?  ?  ?  ?  ?  ?  ?  ?  ?General Comments: Patient completing pillbox with no errors in session ?  ?  ?   ?Exercises   ? ?  ?Shoulder Instructions   ? ? ?  ?General Comments    ? ? ?Pertinent Vitals/ Pain       Pain Assessment ?Pain Assessment: No/denies pain ? ?Home Living   ?  ?  ?  ?  ?  ?  ?  ?  ?  ?  ?  ?  ?  ?  ?  ?  ?  ?  ? ?  ?  Prior Functioning/Environment    ?  ?  ?  ?   ? ?Frequency ? Min 3X/week  ? ? ? ? ?  ?Progress Toward Goals ? ?OT Goals(current goals can now be found in the care plan section) ? Progress towards OT goals: Progressing toward goals ? ?Acute Rehab OT Goals ?Patient Stated Goal: to get stronger ?OT Goal Formulation: With patient ?Time For Goal Achievement: 11/26/21 ?Potential to Achieve Goals: Good  ?Plan Discharge plan remains appropriate   ? ?Co-evaluation ? ? ?   ?  ?  ?  ?  ? ?  ?AM-PAC OT "6 Clicks" Daily Activity     ?Outcome Measure ? ? Help from another person eating meals?: None ?Help from another person taking care of personal grooming?: None ?Help from another person toileting, which includes using toliet, bedpan, or urinal?: A Little ?Help from another person bathing (including washing, rinsing, drying)?: A Little ?Help from another person to put on and taking off regular upper body clothing?: A Little ?Help from another person to put on and taking off regular lower body clothing?: A Lot ?6 Click Score: 19 ? ?  ?End of Session  Equipment Utilized During Treatment: Other (comment) (Pillbox test) ? ?OT Visit Diagnosis: Unsteadiness on feet (R26.81);Other abnormalities of gait and mobility (R26.89);Repeated falls (R29.6);History of falling (Z91.81);Muscle weakness (generalized) (M62.81);Other symptoms and signs involving cognitive function;Pain ?  ?Activity Tolerance Patient tolerated treatment well ?  ?Patient Left in bed;with call bell/phone within reach ?  ?Nurse Communication Mobility status ?  ? ?   ? ?Time: 8242-3536 ?OT Time Calculation (min): 20 min ? ?Charges: OT General Charges ?$OT Visit: 1 Visit ?OT Treatments ?$Self Care/Home Management : 8-22 mins ? ?Luis Marquez, OTR/L ?Acute Rehabilitation Services ?[phone] ?[phone]  ? ?Luis Marquez ?11/15/2021, 3:37 PM ?

## 2021-11-15 NOTE — Progress Notes (Signed)
Physical Therapy Treatment ?Patient Details ?Name: Luis Marquez ?MRN: [id number] ?DOB: [birth year] ?Today's Date: 11/15/2021 ? ? ?History of Present Illness 61 y/o male presented to ED on 11/02/21 after fall in parking lot and hitting R hand with urinary incontinence. CT head negative. Admitted for severe electrolyte imbalance, AKI, and ETOH withdrawal. PMH: HTN, alcohol use disorder, OA and IM nail Rt femur 2016. ? ?  ?PT Comments  ? ? Pt remains very pleasant and motivated to return to independence. Improved ability with transfers but still needing min assist to complete rise from various surfaces. Encouraged mobility with nursing and continued HEP. Will continue to follow.  ? ?HR 93-119 ?SpO2 93% RA ?   ?Recommendations for follow up therapy are one component of a multi-disciplinary discharge planning process, led by the attending physician.  Recommendations may be updated based on patient status, additional functional criteria and insurance authorization. ? ?Follow Up Recommendations ? Home health PT ?  ?  ?Assistance Recommended at Discharge Intermittent Supervision/Assistance  ?Patient can return home with the following A little help with bathing/dressing/bathroom;Assistance with cooking/housework;Assist for transportation;Help with stairs or ramp for entrance;A little help with walking and/or transfers ?  ?Equipment Recommendations ? BSC/3in1  ?  ?Recommendations for Other Services   ? ? ?  ?Precautions / Restrictions Precautions ?Precautions: Fall ?Precaution Comments: R radial heart cath 4/27  ?  ? ?Mobility ? Bed Mobility ?  ?  ?  ?  ?  ?Sit to supine: Supervision ?  ?General bed mobility comments: in chair on arrival and returned to supine ?  ? ?Transfers ?Overall transfer level: Needs assistance ?  ?Transfers: Sit to/from Stand ?Sit to Stand: Min assist, Mod assist, Min guard ?  ?  ?  ?  ?  ?General transfer comment: assist to rise with cues for precautions and sequence of transfer. pt with min assist to stand  from recliner, mod from toilet and minguard to rise x 5 trials from EOB ?  ? ?Ambulation/Gait ?Ambulation/Gait assistance: Min guard ?Gait Distance (Feet): 300 Feet ?Assistive device: Rolling walker (2 wheels) ?Gait Pattern/deviations: Step-through pattern, Decreased stride length ?  ?Gait velocity interpretation: >2.62 ft/sec, indicative of community ambulatory ?  ?General Gait Details: pt with decreased stance time RLE due to chronic pain, increased gait tolerance with pt able to self-regulate distance. 15' then 300'. Cues for proximity to RW and posture ? ? ?Stairs ?Stairs: Yes ?Stairs assistance: Min guard ?Stair Management: Two rails, Step to pattern ?Number of Stairs: 6 ?General stair comments: cues for seqeunce and safety ? ? ?Wheelchair Mobility ?  ? ?Modified Rankin (Stroke Patients Only) ?  ? ? ?  ?Balance Overall balance assessment: Needs assistance ?  ?Sitting balance-Leahy Scale: Good ?Sitting balance - Comments: static sitting without UE support ?  ?Standing balance support: Bilateral upper extremity supported, Reliant on assistive device for balance ?Standing balance-Leahy Scale: Fair ?Standing balance comment: static standing to wash hands, RW for gait ?  ?  ?  ?  ?  ?  ?  ?  ?  ?  ?  ?  ? ?  ?Cognition Arousal/Alertness: Awake/alert ?Behavior During Therapy: [facility] for tasks assessed/performed ?Overall Cognitive Status: Impaired/Different from baseline ?Area of Impairment: Memory ?  ?  ?  ?  ?  ?  ?  ?  ?  ?  ?Memory: Decreased short-term memory ?  ?  ?  ?  ?  ?  ?  ? ?  ?Exercises   ? ?  ?General  Comments   ?  ?  ? ?Pertinent Vitals/Pain Pain Assessment ?Pain Assessment: No/denies pain  ? ? ?Home Living   ?  ?  ?  ?  ?  ?  ?  ?  ?  ?   ?  ?Prior Function    ?  ?  ?   ? ?PT Goals (current goals can now be found in the care plan section) Progress towards PT goals: Progressing toward goals ? ?  ?Frequency ? ? ? Min 3X/week ? ? ? ?  ?PT Plan Current plan remains appropriate  ? ? ?Co-evaluation   ?  ?  ?   ?  ? ?  ?AM-PAC PT "6 Clicks" Mobility   ?Outcome Measure ? Help needed turning from your back to your side while in a flat bed without using bedrails?: A Little ?Help needed moving from lying on your back to sitting on the side of a flat bed without using bedrails?: A Little ?Help needed moving to and from a bed to a chair (including a wheelchair)?: A Little ?Help needed standing up from a chair using your arms (e.g., wheelchair or bedside chair)?: A Little ?Help needed to walk in hospital room?: A Little ?Help needed climbing 3-5 steps with a railing? : A Little ?6 Click Score: 18 ? ?  ?End of Session   ?Activity Tolerance: Patient tolerated treatment well ?Patient left: in chair;with call bell/phone within reach;with chair alarm set ?Nurse Communication: Mobility status ?PT Visit Diagnosis: Muscle weakness (generalized) (M62.81);Other abnormalities of gait and mobility (R26.89) ?  ? ? ?Time: [id number] ?PT Time Calculation (min) (ACUTE ONLY): 35 min ? ?Charges:  $Gait Training: 8-22 mins ?$Therapeutic Activity: 8-22 mins          ?          ? ?Ehtan Delfavero P, PT ?Acute Rehabilitation Services ?Pager: [phone] ?Office: [phone] ? ? ? ?Orville Widmann B Tayia Stonesifer ?11/15/2021, 8:58 AM ? ?

## 2021-11-15 NOTE — Progress Notes (Addendum)
? ? Advanced Heart Failure Rounding Note ? ?PCP-Cardiologist: None  ? ?Subjective:   ? ?cMRI EF 42%  evidence of potential Tako-tsubo CM  RV normal  ?  ?R/LHC  ?  Prox Cx lesion is 20% stenosed. ?  Mid LAD lesion is 30% stenosed. ?  The left ventricular ejection fraction is 25-35% by visual estimate.  ?Findings: ? Ao = 119/68 (90) ?LV = 119/17 ?RA =  3 ?RV = 27/6 ?PA = 25/9 (15) ?PCW = 10 ?Fick cardiac output/index = 8.2/3.7 ?PVR = 0.6 WU ?Ao sat = 94% ?PA sat = 72%, 73% ?SVC sat = 74%  ? ? ?Complaining of intermittent shortness of breath.  ? ?Objective:   ?Weight Range: ?89.9 kg ?Body mass index is 26.15 kg/m?.  ? ?Vital Signs:   ?Temp:  [98.2 ?F (36.8 ?C)] 98.2 ?F (36.8 ?C) (05/01 0400) ?Pulse Rate:  [80] 80 (04/30 0850) ?Resp:  [18] 18 (05/01 0400) ?BP: (120-122)/(70-72) 122/70 (05/01 0400) ?SpO2:  [93 %] 93 % (04/30 0850) ?Weight:  [89.9 kg] 89.9 kg (05/01 0500) ?Last BM Date : 11/13/21 ? ?Weight change: ?Filed Weights  ? 11/13/21 0458 11/14/21 0436 11/15/21 0500  ?Weight: 91.9 kg 89.9 kg 89.9 kg  ? ? ?Intake/Output:  ? ?Intake/Output Summary (Last 24 hours) at 11/15/2021 0734 ?Last data filed at 11/15/2021 3875 ?Gross per 24 hour  ?Intake 120 ml  ?Output 2675 ml  ?Net -2555 ml  ?  ? ? ?Physical Exam  ? ?General:  Sitting in the chair. . No resp difficulty ?HEENT: normal ?Neck: supple. no JVD. Carotids 2+ bilat; no bruits. No lymphadenopathy or thryomegaly appreciated. ?Cor: PMI nondisplaced. Regular rate & rhythm. No rubs, gallops or murmurs. ?Lungs: clearon room air.  ?Abdomen: soft, nontender, nondistended. No hepatosplenomegaly. No bruits or masses. Good bowel sounds. ?Extremities: no cyanosis, clubbing, rash, edema ?Neuro: alert & orientedx3, cranial nerves grossly intact. moves all 4 extremities w/o difficulty. Affect pleasant ? ? ?Telemetry  ? ?SR 80s personally reviewed.  ? ?Labs  ?  ?CBC ?No results for input(s): WBC, NEUTROABS, HGB, HCT, MCV, PLT in the last 72 hours. ? ?Basic Metabolic Panel ?Recent  Labs  ?  11/14/21 ?0321 11/15/21 ?0352  ?NA 142 140  ?K 3.3* 3.8  ?CL 111 109  ?CO2 27 26  ?GLUCOSE 118* 105*  ?BUN 7* 5*  ?CREATININE 0.84 0.82  ?CALCIUM 7.1* 7.3*  ?MG 1.4* 1.7  ?PHOS 3.0 2.9  ? ?Liver Function Tests ?No results for input(s): AST, ALT, ALKPHOS, BILITOT, PROT, ALBUMIN in the last 72 hours. ? ?No results for input(s): LIPASE, AMYLASE in the last 72 hours. ?Cardiac Enzymes ?No results for input(s): CKTOTAL, CKMB, CKMBINDEX, TROPONINI in the last 72 hours. ? ?BNP: ?BNP (last 3 results) ?No results for input(s): BNP in the last 8760 hours. ? ?ProBNP (last 3 results) ?No results for input(s): PROBNP in the last 8760 hours. ? ? ?D-Dimer ?No results for input(s): DDIMER in the last 72 hours. ?Hemoglobin A1C ?No results for input(s): HGBA1C in the last 72 hours. ?Fasting Lipid Panel ?No results for input(s): CHOL, HDL, LDLCALC, TRIG, CHOLHDL, LDLDIRECT in the last 72 hours. ?Thyroid Function Tests ?No results for input(s): TSH, T4TOTAL, T3FREE, THYROIDAB in the last 72 hours. ? ?Invalid input(s): FREET3 ? ?Other results: ? ? ?Imaging  ? ? ?No results found. ? ? ?Medications:   ? ? ?Scheduled Medications: ? amiodarone  200 mg Oral BID  ? apixaban  5 mg Oral BID  ? folic acid  1  mg Oral Daily  ? furosemide  20 mg Oral Daily  ? mouth rinse  15 mL Mouth Rinse BID  ? metoprolol succinate  12.5 mg Oral Daily  ? multivitamin with minerals  1 tablet Oral Daily  ? sacubitril-valsartan  1 tablet Oral BID  ? sodium chloride flush  3 mL Intravenous Q12H  ? sodium chloride flush  3 mL Intravenous Q12H  ? spironolactone  25 mg Oral Daily  ? thiamine  100 mg Oral Daily  ? Or  ? thiamine  100 mg Intravenous Daily  ? umeclidinium bromide  1 puff Inhalation Daily  ? ? ?Infusions: ? sodium chloride 250 mL (11/12/21 0734)  ? ? ? ?PRN Medications: ?sodium chloride, acetaminophen, albuterol, ondansetron (ZOFRAN) IV, sodium chloride flush ? ? ? ?Patient Profile  ? ?Mr Haik is a  61 year old with HTN, emphysema, ETOH abuse,and  tobacco abuse.  ?  ?Admitted with symptomatic hypercalcemia. ? ?Assessment/Plan  ? ?1. Hypercalcemia  ?- Treatment and workup per primary team.  ?- Calcium on admit 14.5. Calcium now down to 7.3  ?- Plan PET scan as an outpatient.. ?  ?2. Delirium  ?- ETOH withdrawal. On CIWA protocol.  ?- Resolved.  ? ?3. A Fib RVR ?- Developed A fib RVR 11/03/21. Converted on amio drip.  ?- Went back in NSR on 4/22. Amio drip stopped 04/24. ?- Maintaining SR. Continue amiodarone 200 mg BID. At discharge cut back to 200 mg daily.  ?- Replace Mag  ?- Anticoagulated with Eliquis ?  ?4.  Acute HFrEF  ?- New reduced EF. Unclear etiology. No previous history of coronary disease.  ?- Echo EF 25%, mid and apical hypokinesis with sparing of base, RV mildly reduced. Echo concerning for Takotsubo. LHC w/ minimal CAD. RHC with normal filling pressures. NICM ETOH abuse/HTN  maybe contributing.  ?- cMRI - EF improving 42% evidence of potential Tako-tsubo CM  RV normal  ?- Volume status stable. Continue lasix 20 mg daily.  ?- Continue Entresto 97-103 mg twice a day.  ?- Continue spiro 25 mg daily ?- Consider SGLT2i in future, worry a bit about hygiene/risk of UTI ?- Continue low dose Toprol XL 12.5 mg daily  ? ?5. Hypomagnesia ?-Mag 1.7 ?-Give 2 grams Mag.  ? ?6. Hypokalemia ?- Stable.   ?- Supp K and Mg  ? ?7. ETOH abuse ?-In Rehab 10 years ago for ETOH abuse.  ?-Drinks 20 beers every 2 days ?-Withdrawal resolved ? ?8. Hypernatremia ?- resolved, ? ?9. Tobacco Abuse ?-Heavy smoker for many years.  ?- Discussed smoking cessation.  ? ? ?HF F/U 11/22/2021 at 3:30  ? ?He will need 30 day supply of medications at d/c .We will provide scale for d/c  ? ?Entresto 97/103 bid ?Cleda Daub 25 daily ?Toprol 25 daily ?Lasix 20 mg and kcl 20 meq prn for weight gain ?Amio 200 daily ?Eliquis 5 bid  ? ? ?Length of Stay: 13 ? ?Tonye Becket, NP  ?11/15/2021, 7:34 AM ? ?Advanced Heart Failure Team ?Pager [phone] (M-F; 7a - 5p)  ?Please contact [facility] for night-coverage  after hours (5p -7a ) and weekends on [email or web address] ? ?Patient seen and examined with the above-signed Advanced Practice Provider and/or Housestaff. I personally reviewed laboratory data, imaging studies and relevant notes. I independently examined the patient and formulated the important aspects of the plan. I have edited the note to reflect any of my changes or salient points. I have personally discussed the plan with the patient and/or family. ? ?  Occasionally SOB. No orthopnea or PND. Weight stable.  ? ?General: Sitting up. No resp difficulty ?HEENT: normal ?Neck: supple. no JVD. Carotids 2+ bilat; no bruits. No lymphadenopathy or thryomegaly appreciated. ?Cor: PMI nondisplaced. Regular rate & rhythm. No rubs, gallops or murmurs. ?Lungs: clear ?Abdomen: soft, nontender, nondistended. No hepatosplenomegaly. No bruits or masses. Good bowel sounds. ?Extremities: no cyanosis, clubbing, rash, edema ?Neuro: alert & orientedx3, cranial nerves grossly intact. moves all 4 extremities w/o difficulty. Affect pleasant ? ?Stable for d/c from our standpoint on above meds. HF team will s/o. We will arrange HF f/u.  ? ?Arvilla Meres, MD  ?8:22 AM ? ? ? ?

## 2021-11-15 NOTE — Progress Notes (Signed)
Heart Failure Nurse Navigator Progress Note  ? ?Dropped off scale and daily weight recording sheets to patient. Educated him on the importance of daily weights and when to call his doctor or come to the ER. Patient verbalized his understanding.  ? ?Earnestine Leys, BSN, RN ?Heart Failure Nurse Navigator ?Secure Chat Only  ?

## 2021-11-15 NOTE — TOC CM/SW Note (Addendum)
HF TOC CM spoke to pt at bedside. Pt states he does not have insurance. States he currently lives at [facility], they will provide transportation at [employer]. Pt will need meds from [facility] pharmacy, will be covered by HF funds/MATCH. Pt has follow up appt with PCP, Loreen Freud NP on 11/26/2021. Appt will be on pt dc paperwork. Contacted [employer] rep, Jasmine for RW to be delivered to room. Isidoro Donning RN3 CCM, Heart Failure TOC CM [phone]  ? ?Pt's Medicaid and Disability application was completed by [employer]. Isidoro Donning RN3 CCM, Heart Failure TOC CM [phone]  ?

## 2021-11-15 NOTE — Plan of Care (Signed)
  Problem: Education: Goal: Knowledge of General Education information will improve Description Including pain rating scale, medication(s)/side effects and non-pharmacologic comfort measures Outcome: Progressing   Problem: Health Behavior/Discharge Planning: Goal: Ability to manage health-related needs will improve Outcome: Progressing   Problem: Clinical Measurements: Goal: Ability to maintain clinical measurements within normal limits will improve Outcome: Progressing Goal: Will remain free from infection Outcome: Progressing Goal: Diagnostic test results will improve Outcome: Progressing Goal: Respiratory complications will improve Outcome: Progressing Goal: Cardiovascular complication will be avoided Outcome: Progressing   Problem: Activity: Goal: Risk for activity intolerance will decrease Outcome: Progressing   Problem: Elimination: Goal: Will not experience complications related to bowel motility Outcome: Progressing Goal: Will not experience complications related to urinary retention Outcome: Progressing   Problem: Safety: Goal: Ability to remain free from injury will improve Outcome: Progressing   

## 2021-11-15 NOTE — Progress Notes (Signed)
Pt declined ambulation. He is eating and sts he already walked x2 today. Pt shared how he is "getting back on track". He could voice to watch for fluid increase of 3-5 lbs on his scale and what to do. Gave him low sodium diets and brief discussion.  ?[phone] ?Ethelda Chick CES, ACSM ?2:51 PM ?11/15/2021 ? ?

## 2021-11-15 NOTE — Progress Notes (Incomplete)
? ?Subjective: ?No acute overnight events.  ? ?Patient states he is doing well. Patient was seen at bedside during rounds today. Pt reports feeling well and moving around. Continues to work with PT.  ? ?Pt is updated on the plan for today, and all questions and concerns are addressed.  ? ?Objective: ? ?Vital signs in last 24 hours: ?Vitals:  ? 11/15/21 0827 11/15/21 0920 11/15/21 1225 11/15/21 2000  ?BP:  131/79 (!) 134/93 (!) 142/81  ?Pulse: (!) 109 87 84   ?Resp:   18 18  ?Temp:    98.1 ?F (36.7 ?C)  ?TempSrc:    Oral  ?SpO2: 93% 95% 98%   ?Weight:      ?Height:      ? ?Constitutional: well-appearing, NAD ?Neck: No JVD appreciated  ?Cardiovascular: regular rate and rhythm, no m/r/g. Non-edematous lower extremities.  ?Pulmonary/Chest: normal work of breathing on room air, lungs clear to auscultation bilaterally ?Neurological: alert & oriented x 3, follows commands  ?Skin: warm and dry ?Psych: Normal mood and affect ? ?Assessment/Plan: ? ?Principal Problem: ?  Hypercalcemia ?Active Problems: ?  Delirium tremens ([place]) ?  Hypokalemia ?  Solitary pulmonary nodule ?  Hand laceration ?  Essential hypertension ?  Alcohol withdrawal ([place]) ?  AKI (acute kidney injury) ([place]) ?  Atrial fibrillation with RVR ([place]) ?  Acute HFrEF (heart failure with reduced ejection fraction) ([place]) ? ?Carsten Kocis is a 61 y.o. with a pertinent PMH of emphysema, prior tobacco use, who presented with a fall and admitted 4/18 for hypercalcemia with hospital course complicated by acute alcohol withdrawal, new atrial fibrillation with RVR, and newly diagnosed HFrEF. He is currently working with PT/OT to gain strength return home.  ? ?I/O- down 9.3L since admit, down 5.6kg since admit  ?Mag- 1.5  ?K- 3.4 ?Calcium- Ca 14.5 on admission, with PTH low. Tx'd with 1 dose of zoledronic acid on 4/18 and two doses of calcitonin. Ca 7.3 yesterday -> 7.8 today ? ?#Newly diagnosed HFrEF (25%) concerning for Takotsubo  ?#Nonischemic cardiomyopathy likely  secondary to hypertension and EtOH ?#Non-obstructive CAD ?EF improved on 4/28 MRI from 25% to 42%. Minimal CAD on cath. Nonischemic cardiomyopathy possibly related to alcohol use and hypertension. Appears euvolemic on exam. Cardiology recommends repeating echo in 2 to 3 months. HF team has signed off.  ?- Entresto 97-103 BID ?- Metoprolol succinate 12.5mg  QD ?- Spironolactone 25mg  QD ?- PO furosemide 20mg  QD ?- Per cards, r/p Echo in 2-28mo and can consider SGLT2i in the future  ?  ?#Physical deconditioning ?Barrier to safe discharge at this time. Continues to work with PT with some improvement. PT and OT recommending OP PT/OT. Medicaid application is pending; appreciate case management and social work for assistance. Medically stable for DC pending insurance process.  ?  ?#Atrial fibrillation with RVR, resolved  ?Developed A-fib with RVR on 4/19 and was started on amiodarone drip. Maintaining SR with p.o. amiodarone. ?- Per HF team: "Continue amiodarone 200 mg BID. At discharge cut back to 200 mg daily." ?  ?#History of hypercalcemia ?Ca 14.5 on admission, with PTH low. Tx'd with 1 dose of zoledronic acid on 4/18 and two doses of calcitonin. Ca 7.3 yesterday -> 7.8 today  ?Ddx include lytic disease versus malignancy causing hypercalcemia. PSA within normal limits, PTH RP was negative, TSH within normal limits. CT showed pulmonary nodules in right upper and lower lung. ?- PET scan as outpatient ?- Daily BMP ?  ?#Alcohol use disorder ?Patient is at baseline.  ?-  Naltrexone at discharge ? ?#Hypomagnesemia  ?Mag 1.5; repleted  ? ?#Hypokalemia  ?K 3.4; repleted ? ? ?Diet: Heart Healthy ?VTE:  Apixaban ?Code: Full ? ?Lajean Manes, MD  ?Internal Medicine Resident, PGY-1 ?Pager: [phone] ?After 5pm on weekdays and 1pm on weekends: On Call pager [phone] ?

## 2021-11-15 NOTE — Progress Notes (Signed)
OT Cancellation Note ? ?Patient Details ?Name: Luis Marquez ?MRN: [id number] ?DOB: 1960/12/15 ? ? ?Cancelled Treatment:    Reason Eval/Treat Not Completed: Patient at procedure or test/ unavailable Patient working with cardiac rehab, OT will follow back as time permits.  ? ?Luis Glen E. Anjenette Marquez, OTR/L ?Acute Rehabilitation Services ?[phone] ?[phone]  ? ?Luis Marquez ?11/15/2021, 2:25 PM ?

## 2021-11-16 ENCOUNTER — Other Ambulatory Visit (HOSPITAL_COMMUNITY): Payer: Self-pay

## 2021-11-16 LAB — BASIC METABOLIC PANEL
Anion gap: 6 (ref 5–15)
BUN: 7 mg/dL — ABNORMAL LOW (ref 8–23)
CO2: 26 mmol/L (ref 22–32)
Calcium: 7.8 mg/dL — ABNORMAL LOW (ref 8.9–10.3)
Chloride: 107 mmol/L (ref 98–111)
Creatinine, Ser: 0.85 mg/dL (ref 0.61–1.24)
GFR, Estimated: >60 mL/min (ref >60)
Glucose, Bld: 106 mg/dL — ABNORMAL HIGH (ref 70–99)
Potassium: 3.4 mmol/L — ABNORMAL LOW (ref 3.5–5.1)
Sodium: 139 mmol/L (ref 135–145)

## 2021-11-16 LAB — MAGNESIUM: Magnesium: 1.5 mg/dL — ABNORMAL LOW (ref 1.7–2.4)

## 2021-11-16 MED ORDER — MAGNESIUM SULFATE 2 GM/50ML IV SOLN
2.0000 g | Freq: Once | INTRAVENOUS | Status: AC
  Administered 2021-11-16: 2 g via INTRAVENOUS
  Filled 2021-11-16: qty 50

## 2021-11-16 MED ORDER — POTASSIUM CHLORIDE CRYS ER 20 MEQ PO TBCR
20.0000 meq | EXTENDED_RELEASE_TABLET | ORAL | 0 refills | Status: DC | PRN
  Filled 2021-11-16: qty 30, 30d supply, fill #0

## 2021-11-16 MED ORDER — UMECLIDINIUM BROMIDE 62.5 MCG/ACT IN AEPB
1.0000 | INHALATION_SPRAY | Freq: Every day | RESPIRATORY_TRACT | 0 refills | Status: DC
Start: 2021-11-17 — End: 2021-12-09
  Filled 2021-11-16: qty 30, 30d supply, fill #0

## 2021-11-16 MED ORDER — POTASSIUM CHLORIDE CRYS ER 20 MEQ PO TBCR
40.0000 meq | EXTENDED_RELEASE_TABLET | Freq: Two times a day (BID) | ORAL | Status: DC
  Administered 2021-11-16: 40 meq via ORAL
  Filled 2021-11-16: qty 2

## 2021-11-16 MED ORDER — AMIODARONE HCL 200 MG PO TABS
200.0000 mg | ORAL_TABLET | Freq: Every day | ORAL | 0 refills | Status: DC
  Filled 2021-11-16: qty 30, 30d supply, fill #0

## 2021-11-16 MED ORDER — FUROSEMIDE 20 MG PO TABS
20.0000 mg | ORAL_TABLET | Freq: Every day | ORAL | 0 refills | Status: DC | PRN
  Filled 2021-11-16: qty 30, 30d supply, fill #0

## 2021-11-16 NOTE — TOC CM/SW Note (Signed)
1100 HF TOC CM spoke to pt at bedside. Pt has TOC meds, and scale for home. [employer] rep will provide transportation home. Provided pt with contact number for [facility] to follow up on disability application. Pt requesting note for his job. Isidoro Donning RN3 CCM, Heart Failure TOC CM [phone]  ?

## 2021-11-16 NOTE — Discharge Instructions (Addendum)
You were admitted for recurrent falls but also found to have diagnosis of heart failure as well as atrial fibrillation. Please continue to take all of your medications as listed in this discharge. Limit the amount of fluids you consume, including water and sodas. Please weight yourself to make sure you weight is not going up, and if it is, let your doctor know.  ? ?Please also follow up with your heart doctor and also your PCP.  ?

## 2021-11-16 NOTE — Progress Notes (Signed)
Discharge instructions given to the patient.  TOC meds received:  amiodarone, eliquis, entresto, folic acid, metoprolol succinate, naltrexone, and spironolactone.  Umeclidinium bromide not covered by patient's insurance according to pharmacy, not filled.  Patient will bring home the one currently used at the bedside.  Reviewed meds with patient.  Questions addressed.  Encouraged patient to call the doctor for questions.  Patient verbalized understanding of instructions. ?

## 2021-11-16 NOTE — Progress Notes (Signed)
Physical Therapy Treatment ?Patient Details ?Name: Luis Marquez ?MRN: [id number] ?DOB: [birth year] ?Today's Date: 11/16/2021 ? ? ?History of Present Illness 61 y/o male presented to ED on 11/02/21 after fall in parking lot and hitting R hand with urinary incontinence. CT head negative. Admitted for severe electrolyte imbalance, AKI, and ETOH withdrawal. PMH: HTN, alcohol use disorder, OA and IM nail Rt femur 2016. ? ?  ?PT Comments  ? ? PT very pleasant and progressing well. Pt able to rise without physical assist with armrest on chair but continues to require min assist from bed. Increased gait distance and increased struggle with transfers with fatigue. Will continue to follow.  ? ?HR 80-101 ?BP 133/82 (97) ?96% RA ?   ?Recommendations for follow up therapy are one component of a multi-disciplinary discharge planning process, led by the attending physician.  Recommendations may be updated based on patient status, additional functional criteria and insurance authorization. ? ?Follow Up Recommendations ? Home health PT ?  ?  ?Assistance Recommended at Discharge Intermittent Supervision/Assistance  ?Patient can return home with the following A little help with bathing/dressing/bathroom;Assistance with cooking/housework;Assist for transportation;Help with stairs or ramp for entrance;A little help with walking and/or transfers ?  ?Equipment Recommendations ? None recommended by PT  ?  ?Recommendations for Other Services   ? ? ?  ?Precautions / Restrictions Precautions ?Precautions: Fall ?Precaution Comments: R radial heart cath 4/27  ?  ? ?Mobility ? Bed Mobility ?Overal bed mobility: Modified Independent ?Bed Mobility: Supine to Sit, Sit to Supine ?  ?  ?  ?  ?  ?General bed mobility comments: bed flat with pt able to rise and lower without physical assist ?  ? ?Transfers ?Overall transfer level: Needs assistance ?  ?Transfers: Sit to/from Stand ?Sit to Stand: Min guard, Min assist ?  ?  ?  ?  ?  ?General transfer comment:  min assist to rise from bed with 3 attempts. minguard to rise from chair with one armrest x 6 trials ?  ? ?Ambulation/Gait ?Ambulation/Gait assistance: Supervision ?Gait Distance (Feet): 400 Feet ?Assistive device: Rolling walker (2 wheels) ?Gait Pattern/deviations: Step-through pattern, Decreased stride length, Decreased stance time - right ?  ?Gait velocity interpretation: 1.31 - 2.62 ft/sec, indicative of limited community ambulator ?  ?General Gait Details: pt with decreased stance time RLE due to chronic pain, increased gait tolerance with pt able to self-regulate distance. Cues for proximity to RW and posture ? ? ?Stairs ?  ?  ?  ?  ?  ? ? ?Wheelchair Mobility ?  ? ?Modified Rankin (Stroke Patients Only) ?  ? ? ?  ?Balance Overall balance assessment: Needs assistance ?Sitting-balance support: No upper extremity supported, Feet supported ?Sitting balance-Leahy Scale: Good ?  ?  ?Standing balance support: Bilateral upper extremity supported, Reliant on assistive device for balance ?Standing balance-Leahy Scale: Fair ?Standing balance comment: RW for gait ?  ?  ?  ?  ?  ?  ?  ?  ?  ?  ?  ?  ? ?  ?Cognition Arousal/Alertness: Awake/alert ?Behavior During Therapy: [facility] for tasks assessed/performed ?Overall Cognitive Status: Within Functional Limits for tasks assessed ?  ?  ?  ?  ?  ?  ?  ?  ?  ?  ?  ?  ?  ?  ?  ?  ?  ?  ?  ? ?  ?Exercises   ? ?  ?General Comments   ?  ?  ? ?Pertinent Vitals/Pain Pain Assessment ?  Pain Assessment: No/denies pain  ? ? ?Home Living   ?  ?  ?  ?  ?  ?  ?  ?  ?  ?   ?  ?Prior Function    ?  ?  ?   ? ?PT Goals (current goals can now be found in the care plan section) Progress towards PT goals: Progressing toward goals ? ?  ?Frequency ? ? ? Min 3X/week ? ? ? ?  ?PT Plan Current plan remains appropriate  ? ? ?Co-evaluation   ?  ?  ?  ?  ? ?  ?AM-PAC PT "6 Clicks" Mobility   ?Outcome Measure ? Help needed turning from your back to your side while in a flat bed without using bedrails?:  None ?Help needed moving from lying on your back to sitting on the side of a flat bed without using bedrails?: None ?Help needed moving to and from a bed to a chair (including a wheelchair)?: A Little ?Help needed standing up from a chair using your arms (e.g., wheelchair or bedside chair)?: A Little ?Help needed to walk in hospital room?: A Little ?Help needed climbing 3-5 steps with a railing? : A Little ?6 Click Score: 20 ? ?  ?End of Session   ?Activity Tolerance: Patient tolerated treatment well ?Patient left: in chair;with call bell/phone within reach;with chair alarm set ?Nurse Communication: Mobility status ?PT Visit Diagnosis: Muscle weakness (generalized) (M62.81);Other abnormalities of gait and mobility (R26.89) ?  ? ? ?Time: [id number] ?PT Time Calculation (min) (ACUTE ONLY): 21 min ? ?Charges:  $Gait Training: 8-22 mins          ?          ? ?Anakaren Campion P, PT ?Acute Rehabilitation Services ?Pager: [phone] ?Office: [phone] ? ? ? ?Anushree Dorsi B Laiklyn Pilkenton ?11/16/2021, 8:48 AM ? ?

## 2021-11-16 NOTE — Progress Notes (Signed)
TOC pharmacy brought additional meds to patient's room - furosemide and potassium.  Patient has received all discharge meds at this time.  Work note to be handed by Dr. Allena Katz.  Discharged to [facility]. ?

## 2021-11-16 NOTE — Progress Notes (Addendum)
MD updated discharge meds to include potassium.  Med not included in the medicines sent by [facility] pharmacy.  Checking with CM for this update, pending DC. ?

## 2021-11-16 NOTE — Discharge Summary (Addendum)
? ?Name: Luis Marquez ?MRN: [id number] ?DOB: [birth year] 61 y.o. ?PCP: Claiborne Rigg, NP ? ?Date of Admission: 11/02/2021  7:49 AM ?Date of Discharge:  11/16/21 ?Attending Physician: Dr. Antony Contras ? ?DISCHARGE DIAGNOSIS:  ?Primary Problem: Hypercalcemia  ? ?Hospital Problems: ?Principal Problem: ?  Hypercalcemia ?Active Problems: ?  Delirium tremens (HCC) ?  Hypokalemia ?  Solitary pulmonary nodule ?  Hand laceration ?  Essential hypertension ?  Alcohol withdrawal (HCC) ?  AKI (acute kidney injury) (HCC) ?  Atrial fibrillation with RVR (HCC) ?  Acute HFrEF (heart failure with reduced ejection fraction) (HCC) ?  ? ?DISCHARGE MEDICATIONS:  ? ?Allergies as of 11/16/2021   ?No Known Allergies ?  ? ?  ?Medication List  ?  ? ?STOP taking these medications   ? ?amLODipine 10 MG tablet ?Commonly known as: NORVASC ?  ?lisinopril 20 MG tablet ?Commonly known as: ZESTRIL ?  ? ?  ? ?TAKE these medications   ? ?amiodarone 200 MG tablet ?Commonly known as: PACERONE ?Take 1 tablet (200 mg total) by mouth daily. ?  ?Breo Ellipta 100-25 MCG/ACT Aepb ?Generic drug: fluticasone furoate-vilanterol ?Inhale 1 puff into the lungs daily. ?  ?Eliquis 5 MG Tabs tablet ?Generic drug: apixaban ?Take 1 tablet (5 mg total) by mouth 2 (two) times daily. ?  ?Entresto 97-103 MG ?Generic drug: sacubitril-valsartan ?Take 1 tablet by mouth 2 (two) times daily. ?  ?ferrous sulfate 325 (65 FE) MG tablet ?Take 1 tablet (325 mg total) by mouth 2 (two) times daily with a meal. ?  ?folic acid 1 MG tablet ?Commonly known as: FOLVITE ?Take 1 tablet (1 mg total) by mouth daily. ?  ?furosemide 20 MG tablet ?Commonly known as: LASIX ?Take 1 tablet (20 mg total) by mouth daily as needed for edema or fluid. ?What changed: reasons to take this ?  ?gabapentin 300 MG capsule ?Commonly known as: NEURONTIN ?Take 1 capsule (300 mg total) by mouth at bedtime. ?  ?GLUCOS-CHONDROIT-MSM-C-HYAL PO ?Take 1 tablet by mouth daily. ?  ?metoprolol succinate 25 MG 24 hr  tablet ?Commonly known as: TOPROL-XL ?Take 0.5 tablets (12.5 mg total) by mouth daily. ?  ?multivitamin with minerals Tabs tablet ?Take 1 tablet by mouth daily. ?  ?naltrexone 50 MG tablet ?Commonly known as: DEPADE ?Take 1 tablet (50 mg total) by mouth daily. ?  ?naproxen sodium 220 MG tablet ?Commonly known as: ALEVE ?Take 220 mg by mouth 2 (two) times daily as needed (moderate pain). ?  ?potassium chloride SA 20 MEQ tablet ?Commonly known as: KLOR-CON M ?Take 1 tablet (20 mEq total) by mouth as needed. Take with Lasix as needed ?  ?spironolactone 25 MG tablet ?Commonly known as: ALDACTONE ?Take 1 tablet (25 mg total) by mouth daily. ?  ?umeclidinium bromide 62.5 MCG/ACT Aepb ?Commonly known as: INCRUSE ELLIPTA ?Inhale 1 puff into the lungs daily. ?Start taking on: Nov 17, 2021 ?  ?Ventolin HFA 108 (90 Base) MCG/ACT inhaler ?Generic drug: albuterol ?INHALE 2 PUFFS INTO THE LUNGS EVERY 6 (SIX) HOURS AS NEEDED FOR WHEEZING OR SHORTNESS OF BREATH (COUGH). ?  ? ?  ? ?  ?  ? ? ?  ?Durable Medical Equipment  ?(From admission, onward)  ?  ? ? ?  ? ?  Start     Ordered  ? 11/16/21 5885  For home use only DME 3 n 1  Once       ? 11/16/21 0631  ? 11/15/21 1653  For home use only DME Walker rolling  Once       ?Question Answer Comment  ?Walker: With 5 Inch Wheels   ?Patient needs a walker to treat with the following condition Heart failure (HCC)   ?  ? 11/15/21 1653  ? ?  ?  ? ?  ? ? ?  ?Discharge Care Instructions  ?(From admission, onward)  ?  ? ? ?  ? ?  Start     Ordered  ? 11/16/21 0000  Discharge wound care:       ?Comments: Daily dressing change as needed  ? 11/16/21 1059  ? ?  ?  ? ?  ? ? ?DISPOSITION AND FOLLOW-UP:  ?Luis Marquez was discharged from [facility] in stable condition. At the hospital follow up visit please address: ? ?Follow-up Recommendations: ?Consults: follow up with Heart Failure team  ?Labs: Basic Metabolic Profile, Magnesium  ?Studies: PET scan, PFTs ?Medications: ensure  compliance to new medications:Lasix, Westley Foots, Toprol XL. Consider SGLT2i at follow up.  ? ?Follow-up Appointments: ? Follow-up Information   ? ? [facility] Follow up on 11/22/2021.   ?Specialty: Cardiology ?Why: at 3:30 Located at [facility]. Entrance C. ?Contact information: ?[address] ?[id number] mc ?[place] [address] ?[phone] ? ?  ?  ? ? Claiborne Rigg, NP. Call today.   ?Specialty: Nurse Practitioner ?Why: To make an appointment for next week for a post hospital follow up appointment. ?Contact information: ?[address] ?[place] [address] ?[phone] ? ? ?  ?  ? ?  ?  ? ?  ? ? ?HOSPITAL COURSE:  ?Patient Summary: ?Newly diagnosed HFrEF (25%) concerning for Takotsubo  ?Nonischemic cardiomyopathy likely secondary to hypertension and EtOH ?Non-obstructive CAD ?Echo showed EF of 20-25% with regional wall motion abnormalities. Etiology could be alcohol, chronic hypertension, or ischemic. Cardiology team consulted and their concern for Takotsubo. ?Cardiology and heart failure team consulted.  There was concern for Takotsubo initially.  Right and left heart cath was completed that showed high output heart failure with normal filling pressures. Cardiac MRI was completed and showed EF improved on 4/28 MRI from 25% to 42% . Nonischemic cardiomyopathy possibly related to alcohol use and hypertension.  ?Started on GDMT including: ?- Entresto 97-103 BID ?- Metoprolol succinate 12.5mg  QD ?- Spironolactone 25mg  QD ?- PO furosemide 20mg  QD ?- Per cards, r/p Echo in 2-10mo and can consider SGLT2i in the future  ?Euvolemic on exam at time of discharge. He is discharged with a scale and 20 mg lasix and KCL prn for weight gain. Cardiology recommends repeating echo in 2 to 3 months. He is to follow up with HF team as an OP. ? ?Symptomatic severe electrolyte derangements ?Chloride depleted metabolic  alkalosis ?Multiple electrolyte abnormalities on admission, corrected calcium of 15.6 mg/dL, similar to mmol/ L and magnesium under 1 mg/dL.  He was given calcitonin and bisphosphonate.  Hypercalcemia work-up showed low PTH at 8.  Thyroid disease, granuloma disease ruled out.  Differentials include paraneoplastic syndrome versus lytic bone disease. PSA low.  SPEP reflective of hypoalbuminemia. Patient with low-dose CT screening in 2020 that showed multiple small pulmonary nodules throughout both lungs largest in the posterior aspect of the left lower lobe that was 5.8 mm.  This was classified as lung RADS 2 with recommended annual screening however it does not appears that patient has had follow-up imaging.  He will need further evaluation as outpatient with PET scan. ? ?Atrial  fibrillation with RVR ?EKG showed atrial fibrillation with RVR.  Patient was started initially on diltiazem and echocardiogram ordered.  Echo showed reduced EF of 20 to 25% with apical hypokinesis.  Maintaining SR with p.o. amiodarone. Discharged on Amiodarone 200 mg daily.  ? ?Acute kidney injury ?Creatinine initially elevated at 1.69. Baseline at 0.7. Creatinine returned to 0.9 with fluid resuscitation.  ? ?Right hand laceration ?Patient with right hand laceration after fall.  Laceration is located over the second MCP with proximal based flap. This was a gaping wound that was sutured by the emergency department staff.   Is normal sensation over the right hand and digits.  No abnormalities with movement.  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 10 sutures placed 04/19 and removed after 10 days. ? ?Delirium tremens ?Alcohol use disorder ?Patient with prior history of DT and multiple stays in rehabilitation. On hospital day 2 he went into alcohol withdraw and this was managed with Ativan and Librium. He has worked with [facility] in the past  communicated with them about what happened during hospital stay. He is interested in taking naltrexone to help control craving. Prescribed at time of discharge. ? ?COPD ?Home medications include BREO and albuterol. No prior PFTs on file.

## 2021-11-17 ENCOUNTER — Other Ambulatory Visit (HOSPITAL_COMMUNITY): Payer: Self-pay

## 2021-11-22 ENCOUNTER — Other Ambulatory Visit: Payer: Self-pay

## 2021-11-22 ENCOUNTER — Ambulatory Visit (HOSPITAL_COMMUNITY)
Admit: 2021-11-22 | Discharge: 2021-11-22 | Disposition: A | Discharge status: Home / Self Care | Payer: Medicaid Other | Attending: Family Medicine | Admitting: Family Medicine

## 2021-11-22 ENCOUNTER — Encounter (HOSPITAL_COMMUNITY): Payer: Self-pay

## 2021-11-22 VITALS — BP 100/64 | HR 87 | Wt 185.0 lb

## 2021-11-22 DIAGNOSIS — E876 Hypokalemia: Secondary | ICD-10-CM | POA: Diagnosis not present

## 2021-11-22 DIAGNOSIS — I2510 Atherosclerotic heart disease of native coronary artery without angina pectoris: Secondary | ICD-10-CM | POA: Diagnosis not present

## 2021-11-22 DIAGNOSIS — I5022 Chronic systolic (congestive) heart failure: Secondary | ICD-10-CM | POA: Insufficient documentation

## 2021-11-22 DIAGNOSIS — I4891 Unspecified atrial fibrillation: Secondary | ICD-10-CM | POA: Insufficient documentation

## 2021-11-22 DIAGNOSIS — Z720 Tobacco use: Secondary | ICD-10-CM

## 2021-11-22 DIAGNOSIS — F17200 Nicotine dependence, unspecified, uncomplicated: Secondary | ICD-10-CM | POA: Insufficient documentation

## 2021-11-22 DIAGNOSIS — I480 Paroxysmal atrial fibrillation: Secondary | ICD-10-CM

## 2021-11-22 DIAGNOSIS — Z7901 Long term (current) use of anticoagulants: Secondary | ICD-10-CM | POA: Insufficient documentation

## 2021-11-22 DIAGNOSIS — Z79899 Other long term (current) drug therapy: Secondary | ICD-10-CM | POA: Insufficient documentation

## 2021-11-22 DIAGNOSIS — I110 Hypertensive heart disease with heart failure: Secondary | ICD-10-CM | POA: Diagnosis not present

## 2021-11-22 DIAGNOSIS — F1010 Alcohol abuse, uncomplicated: Secondary | ICD-10-CM | POA: Diagnosis not present

## 2021-11-22 LAB — BASIC METABOLIC PANEL
Anion gap: 10 (ref 5–15)
BUN: 7 mg/dL — ABNORMAL LOW (ref 8–23)
CO2: 27 mmol/L (ref 22–32)
Calcium: 9.5 mg/dL (ref 8.9–10.3)
Chloride: 102 mmol/L (ref 98–111)
Creatinine, Ser: 1.24 mg/dL (ref 0.61–1.24)
GFR, Estimated: >60 mL/min (ref >60)
Glucose, Bld: 125 mg/dL — ABNORMAL HIGH (ref 70–99)
Potassium: 3.5 mmol/L (ref 3.5–5.1)
Sodium: 139 mmol/L (ref 135–145)

## 2021-11-22 LAB — CBC
HCT: 35.6 % — ABNORMAL LOW (ref 39.0–52.0)
Hemoglobin: 12.4 g/dL — ABNORMAL LOW (ref 13.0–17.0)
MCH: 34.2 pg — ABNORMAL HIGH (ref 26.0–34.0)
MCHC: 34.8 g/dL (ref 30.0–36.0)
MCV: 98.1 fL (ref 80.0–100.0)
Platelets: 330 10*3/uL (ref 150–400)
RBC: 3.63 MIL/uL — ABNORMAL LOW (ref 4.22–5.81)
RDW: 12.7 % (ref 11.5–15.5)
WBC: 6.6 10*3/uL (ref 4.0–10.5)
nRBC: 0 % (ref 0.0–0.2)

## 2021-11-22 LAB — MAGNESIUM: Magnesium: 1.4 mg/dL — ABNORMAL LOW (ref 1.7–2.4)

## 2021-11-22 MED ORDER — FUROSEMIDE 20 MG PO TABS
20.0000 mg | ORAL_TABLET | Freq: Every day | ORAL | 4 refills | Status: DC | PRN
  Filled 2021-11-22: qty 30, 30d supply, fill #0

## 2021-11-22 NOTE — Progress Notes (Signed)
? ?ADVANCED HF CLINIC CONSULT NOTE ? ?Primary Care: Claiborne Rigg, NP ?HF Cardiologist: Dr. Gala Romney ? ?HPI: ?Mr Luis Marquez is a 61 y.o. with HTN, emphysema, ETOH abuse, tobacco abuse, and new diagnosis of systolic HF.  ?  ?Admitted 5/23 after a fall (? Syncope) and found to have elevated calcium. Treated with IV fluids and IV zometa. Found to be in AF with RVR, started on diltiazem drop. Echo showed EF 20-25% RV mildly reduced. Diltiazem stopped and switched to amiodarone, eventual conversion to NSR. Echo reviewed by Dr Gala Romney, EF 25%.with possible Takotusbo. Underwent R/LHC showing minimal CAD, normal filling pressures. cMRI showed improving LVEF 42%, evidence of potential Tako-tsubo CM, RV normal. Amio weaned off and GDMT titrated. Hospitalization c/b ETOH withdrawal/delirium and hypercalcemia. He was discharged home, weight 197 lbs. ? ?Today he returns for post hospital HF follow up. Overall feeling fine. Main complaint is right hip arthritis, this limits him physically. Denies increasing SOB, CP, dizziness, edema, or PND/Orthopnea. Appetite poor, taste is off. No fever or chills. Weight at home 180-190 pounds, weights every other day. Taking all medications. Smokes 2 cigs when driving, last drank ETOH 5/39/76. Does not cook for himself, lives w/ 6 room mates. ? ? ?Cardiac Studies: ? ?- Echo (5/23): EF 25%, mid and apical hypokinesis with sparing of base, RV mildly reduced. Echo concerning for Takotsubo ? ?- cMRI (5/23): LVEF 42%  evidence of potential Tako-tsubo CM  RV normal  ? ?- R/LHC  (5/23): ?  Prox Cx lesion is 20% stenosed. ?  Mid LAD lesion is 30% stenosed. ?  The left ventricular ejection fraction is 25-35% by visual estimate.  ? ? Ao = 119/68 (90) ?LV = 119/17 ?RA =  3 ?RV = 27/6 ?PA = 25/9 (15) ?PCW = 10 ?Fick cardiac output/index = 8.2/3.7 ?PVR = 0.6 WU ?Ao sat = 94% ?PA sat = 72%, 73% ?SVC sat = 74%  ? ?Review of Systems: [y] = yes, [ ]  = no  ? ?General: Weight gain [ ] ; Weight loss [ ] ;  Anorexia [ ] ; Fatigue [ ] ; Fever [ ] ; Chills [ ] ; Weakness [ ]   ?Cardiac: Chest pain/pressure [ ] ; Resting SOB [ ] ; Exertional SOB [ ] ; Orthopnea [ ] ; Pedal Edema [ ] ; Palpitations [ ] ; Syncope [ ] ; Presyncope [ ] ; Paroxysmal nocturnal dyspnea[ ]   ?Pulmonary: Cough [ ] ; Wheezing[ ] ; Hemoptysis[ ] ; Sputum [ ] ; Snoring [ ]   ?GI: Vomiting[ ] ; Dysphagia[ ] ; Melena[ ] ; Hematochezia [ ] ; Heartburn[ ] ; Abdominal pain [ ] ; Constipation [ ] ; Diarrhea [ ] ; BRBPR [ ]   ?GU: Hematuria[ ] ; Dysuria [ ] ; Nocturia[ ]   ?Vascular: Pain in legs with walking [ ] ; Pain in feet with lying flat [ ] ; Non-healing sores [ ] ; Stroke [ ] ; TIA [ ] ; Slurred speech [ ] ;  ?Neuro: Headaches[ ] ; Vertigo[ ] ; Seizures[ ] ; Paresthesias[ ] ;Blurred vision [ ] ; Diplopia [ ] ; Vision changes [ ]   ?Ortho/Skin: Arthritis Cove.[place]ienne ]; Joint pain Cove.Etienne ]; Muscle pain [ ] ; Joint swelling [ ] ; Back Pain [ ] ; Rash [ ]   ?Psych: Depression[ ] ; Anxiety[ ]   ?Heme: Bleeding problems [ ] ; Clotting disorders [ ] ; Anemia [ ]   ?Endocrine: Diabetes [ ] ; Thyroid dysfunction[ ]  ? ?Past Medical History:  ?Diagnosis Date  ? Hypertension   ? ?Current Outpatient Medications  ?Medication Sig Dispense Refill  ? albuterol (VENTOLIN HFA) 108 (90 Base) MCG/ACT inhaler INHALE 2 PUFFS INTO THE LUNGS EVERY 6 (SIX) HOURS AS NEEDED FOR WHEEZING OR SHORTNESS  OF BREATH (COUGH). 18 g 2  ? amiodarone (PACERONE) 200 MG tablet Take 1 tablet (200 mg total) by mouth daily. 30 tablet 0  ? apixaban (ELIQUIS) 5 MG TABS tablet Take 1 tablet (5 mg total) by mouth 2 (two) times daily. 60 tablet 0  ? ferrous sulfate 325 (65 FE) MG tablet Take 1 tablet (325 mg total) by mouth 2 (two) times daily with a meal. 180 tablet 3  ? folic acid (FOLVITE) 1 MG tablet Take 1 tablet (1 mg total) by mouth daily. 30 tablet 0  ? furosemide (LASIX) 20 MG tablet Take 1 tablet (20 mg total) by mouth daily as needed for edema or fluid. 30 tablet 0  ? GLUCOS-CHONDROIT-MSM-C-HYAL PO Take 1 tablet by mouth daily.    ? metoprolol succinate  (TOPROL-XL) 25 MG 24 hr tablet Take 25 mg by mouth daily.    ? Multiple Vitamin (MULTIVITAMIN WITH MINERALS) TABS tablet Take 1 tablet by mouth daily.    ? naltrexone (DEPADE) 50 MG tablet Take 1 tablet (50 mg total) by mouth daily. 30 tablet 0  ? naproxen sodium (ALEVE) 220 MG tablet Take 220 mg by mouth 2 (two) times daily as needed (moderate pain).    ? sacubitril-valsartan (ENTRESTO) 97-103 MG Take 1 tablet by mouth 2 (two) times daily. 60 tablet 0  ? spironolactone (ALDACTONE) 25 MG tablet Take 1 tablet (25 mg total) by mouth daily. 30 tablet 0  ? umeclidinium bromide (INCRUSE ELLIPTA) 62.5 MCG/ACT AEPB Inhale 1 puff into the lungs daily. 30 each 0  ? fluticasone furoate-vilanterol (BREO ELLIPTA) 100-25 MCG/ACT AEPB Inhale 1 puff into the lungs daily. (Patient not taking: Reported on 11/22/2021) 60 each 2  ? gabapentin (NEURONTIN) 300 MG capsule Take 1 capsule (300 mg total) by mouth at bedtime. (Patient not taking: Reported on 11/22/2021) 90 capsule 0  ? ?No current facility-administered medications for this encounter.  ? ? ?No Known Allergies ? ?  ?Social History  ? ?Socioeconomic History  ? Marital status: Single  ?  Spouse name: Not on file  ? Number of children: Not on file  ? Years of education: Not on file  ? Highest education level: Not on file  ?Occupational History  ? Not on file  ?Tobacco Use  ? Smoking status: Some Days  ?  Packs/day: 0.25  ?  Types: Cigarettes  ?  Passive exposure: Never  ? Smokeless tobacco: Never  ?Vaping Use  ? Vaping Use: Never used  ?Substance and Sexual Activity  ? Alcohol use: Not Currently  ? Drug use: Not Currently  ? Sexual activity: Yes  ?Other Topics Concern  ? Not on file  ?Social History Narrative  ? ** Merged History Encounter **  ?    ? ?Social Determinants of Health  ? ?Financial Resource Strain: Not on file  ?Food Insecurity: Not on file  ?Transportation Needs: Not on file  ?Physical Activity: Not on file  ?Stress: Not on file  ?Social Connections: Not on file   ?Intimate Partner Violence: Not on file  ? ?Family History  ?Problem Relation Age of Onset  ? Diabetes Mother   ? ?BP 100/64 (BP Location: Right Arm)   Pulse 87   Wt 83.9 kg (185 lb)   SpO2 94%   BMI 24.41 kg/m?  ? ?Wt Readings from Last 3 Encounters:  ?11/22/21 83.9 kg (185 lb)  ?11/16/21 90 kg (198 lb 6.6 oz)  ?03/02/21 94.5 kg (208 lb 6.4 oz)  ? ?PHYSICAL EXAM: ?General:  NAD. No resp difficulty, walked into clinic with cane ?HEENT: edentulous ?Neck: Supple. No JVD. Carotids 2+ bilat; no bruits. No lymphadenopathy or thryomegaly appreciated. ?Cor: PMI nondisplaced. Regular rate & rhythm. No rubs, gallops or murmurs. ?Lungs: Clear ?Abdomen: Soft, nontender, nondistended. No hepatosplenomegaly. No bruits or masses. Good bowel sounds. ?Extremities: No cyanosis, clubbing, rash, edema ?Neuro: Alert & oriented x 3, cranial nerves grossly intact. Moves all 4 extremities w/o difficulty. Affect pleasant. ? ?ECG: NSR rBBB PR 178 msec (personally reviewed) ? ?ASSESSMENT & PLAN: ?1.  Chronic Systolic Heart Failure: ?- New reduced EF. Unclear etiology. No previous history of coronary disease.  ?- Echo (5/23): EF 25%, mid and apical hypokinesis with sparing of base, RV mildly reduced, concerning for Takotsubo.  ?- R/LHC (5/23): minimal CAD, normal filling pressures.  ?- NICM ETOH abuse/HTN maybe contributing.  ?- cMRI (5/23): LVEF improving 42%, evidence of potential Takotsubo CM, RV normal  ?- NYHA II. Volume status stable, may be on the dry side. ?- Change Lasix to 20 mg daily PRN  ?- Continue Entresto 97-103 mg bid.  ?- Continue spiro 25 mg daily. ?- Continue Toprol XL 25 mg daily. ?- Consider SGLT2i next. ?- Labs today. ?- Plan to repeat echo in 3 months with GDMT titrated.  ? ?2. A Fib  ?- Developed A fib RVR 4/23 admit.  ?- NSR on ECG today. ?- Continue amiodarone 200 mg daily. Plan to wean down next visit. ?- Continue Eliquis 5 mg bid. No bleeding issues. ?- CBC today. ?  ?3. H/o Hypercalcemia  ?- Calcium on admit  14.5>> 7.3  ?- Primary planning PET scan to further work up. ?  ?4. H/o Hypomagnesia/hypokalemia ?- Labs today. ?  ?5. ETOH abuse ?- In Rehab 10 years ago for ETOH abuse.  ?- Drinks 20 beers every 2 days. ?- No furt

## 2021-11-22 NOTE — Patient Instructions (Addendum)
Thank you for coming in today ? ?Labs were done today, if any labs are abnormal the clinic will call you ?No news is good news ? ?CHANGE Lasix to 20 mg as needed daily For weight gain of 3 lbs in 24 hours or 5 lbs in a week ? ?Your physician recommends that you schedule a follow-up appointment in:  ?4 weeks in clinic  ?12 weeks with echocardiogram with Dr. Haroldine Laws ? ?Your physician has requested that you have an echocardiogram. Echocardiography is a painless test that uses sound waves to create images of your heart. It provides your doctor with information about the size and shape of your heart and how well your heart?s chambers and valves are working. This procedure takes approximately one hour. There are no restrictions for this procedure. ? ?At the [facility], you and your health needs are our priority. As part of our continuing mission to provide you with exceptional heart care, we have created designated Provider Care Teams. These Care Teams include your primary Cardiologist (physician) and Advanced Practice Providers (APPs- Physician Assistants and Nurse Practitioners) who all work together to provide you with the care you need, when you need it.  ? ?You may see any of the following providers on your designated Care Team at your next follow up: ?Dr Glori Bickers ?Dr Loralie Champagne ?Darrick Grinder, NP ?Lyda Jester, PA ?Jessica Milford,NP ?Marlyce Huge, PA ?Audry Riles, PharmD ? ? ?Please be sure to bring in all your medications bottles to every appointment.  ? ?If you have any questions or concerns before your next appointment please send [place] a message through [place] or call our office at [phone].   ? ?TO LEAVE A MESSAGE FOR THE NURSE SELECT OPTION 2, PLEASE LEAVE A MESSAGE INCLUDING: ?YOUR NAME ?DATE OF BIRTH ?CALL BACK NUMBER ?REASON FOR CALL**this is important as we prioritize the call backs ? ?YOU WILL RECEIVE A CALL BACK THE SAME DAY AS LONG AS YOU CALL BEFORE 4:00 PM ? ?  ? ?

## 2021-11-23 ENCOUNTER — Telehealth (HOSPITAL_COMMUNITY): Payer: Self-pay | Admitting: Pharmacy Technician

## 2021-11-23 NOTE — Telephone Encounter (Signed)
Advanced Heart Failure Patient Advocate Encounter ? ?Patient was seen in office yesterday and is currently uninsured. Applications for assistance were started when the patient was admitted, but not signed.  ? ?Patient was given both applications to sign and took them home to [place]. ? ?Will fax once received back. ? ?Archer Asa, CPhT ? ?

## 2021-11-24 ENCOUNTER — Other Ambulatory Visit: Payer: Self-pay

## 2021-11-24 ENCOUNTER — Telehealth (HOSPITAL_COMMUNITY): Payer: Self-pay

## 2021-11-24 MED ORDER — MAGNESIUM OXIDE 400 MG PO TABS
400.0000 mg | ORAL_TABLET | Freq: Every day | ORAL | 3 refills | Status: DC
  Filled 2021-11-24: qty 30, 30d supply, fill #0

## 2021-11-24 NOTE — Telephone Encounter (Signed)
-----   Message from Jacklynn Ganong, [place] sent at 11/22/2021  4:52 PM EDT ----- ?Magnesium on the low side.  ? ?Start Mag oxide 400 mg daily.  ?

## 2021-11-24 NOTE — Telephone Encounter (Signed)
Patient advised and verbalized understanding. New Rx sent into pharmacy.  ? ?Meds ordered this encounter  ?Medications  ? magnesium oxide (MAG-OX) 400 MG tablet  ?  Sig: Take 1 tablet (400 mg total) by mouth daily.  ?  Dispense:  90 tablet  ?  Refill:  3  ? ? ?

## 2021-11-25 ENCOUNTER — Other Ambulatory Visit: Payer: Self-pay

## 2021-11-26 ENCOUNTER — Telehealth: Payer: Self-pay | Admitting: Nurse Practitioner

## 2021-11-26 ENCOUNTER — Ambulatory Visit: Payer: Medicaid Other | Attending: Nurse Practitioner | Admitting: Nurse Practitioner

## 2021-11-26 ENCOUNTER — Other Ambulatory Visit: Payer: Self-pay

## 2021-11-26 ENCOUNTER — Encounter: Payer: Self-pay | Admitting: Nurse Practitioner

## 2021-11-26 VITALS — BP 94/60 | HR 88 | Wt 184.8 lb

## 2021-11-26 DIAGNOSIS — G8929 Other chronic pain: Secondary | ICD-10-CM

## 2021-11-26 DIAGNOSIS — R911 Solitary pulmonary nodule: Secondary | ICD-10-CM

## 2021-11-26 DIAGNOSIS — M25551 Pain in right hip: Secondary | ICD-10-CM

## 2021-11-26 DIAGNOSIS — Z1211 Encounter for screening for malignant neoplasm of colon: Secondary | ICD-10-CM

## 2021-11-26 DIAGNOSIS — J449 Chronic obstructive pulmonary disease, unspecified: Secondary | ICD-10-CM | POA: Diagnosis not present

## 2021-11-26 DIAGNOSIS — I10 Essential (primary) hypertension: Secondary | ICD-10-CM

## 2021-11-26 DIAGNOSIS — G629 Polyneuropathy, unspecified: Secondary | ICD-10-CM

## 2021-11-26 DIAGNOSIS — L602 Onychogryphosis: Secondary | ICD-10-CM

## 2021-11-26 MED ORDER — GABAPENTIN 300 MG PO CAPS
300.0000 mg | ORAL_CAPSULE | Freq: Every day | ORAL | 0 refills | Status: DC
  Filled 2021-11-26: qty 30, 30d supply, fill #0

## 2021-11-26 NOTE — Progress Notes (Signed)
? ?Assessment & Plan:  ?Luis Marquez was seen today for hypertension. ? ?Diagnoses and all orders for this visit: ? ?Essential hypertension ?Continue all antihypertensives as prescribed.  ?Remember to bring in your blood pressure log with you for your follow up appointment.  ?DASH/Mediterranean Diets are healthier choices for HTN.   ? ?Chronic right hip pain ?-     Ambulatory referral to Orthopedic Surgery ? ?Colon cancer screening ?-     Fecal occult blood, imunochemical([facility]) ? ?Neuropathy ?-     gabapentin (NEURONTIN) 300 MG capsule; Take 1 capsule (300 mg total) by mouth at bedtime. ? ?Overgrown toenails ?-     Ambulatory referral to Podiatry ? ?Lung nodule seen on imaging study ?-     CT CHEST NODULE FOLLOW UP LOW DOSE W/O; Future ?-     Ambulatory referral to Pulmonology ? ?Chronic obstructive pulmonary disease, unspecified COPD type (HCC) ?-     Ambulatory referral to Pulmonology ? ? ? ?Patient has been counseled on age-appropriate routine health concerns for screening and prevention. These are reviewed and up-to-date. Referrals have been placed accordingly. Immunizations are up-to-date or declined.    ?Subjective:  ? ?Chief Complaint  ?Patient presents with  ? Hypertension  ? ? ?PMH: HTN, COPD. ? ?HPI ?Luis Marquez 61 y.o. male presents to office today requesting that I fill out his short term disability paperwork. He states he fell on 11-02-2021 while getting ready to walk into work. Unclear if this was a syncopal episode. He is experiencing significant right hip pain. Unfortunately this is also the same hip in which he fractured his femur several years ago and required IM nailing.  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  ? ? ?He states he will eventually need to apply for permanent disability because of his heart.  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 ?Blood pressure is on the lower side of normal. He is  asymptomatic today so will not make any changes with his medications. He does continue to smoke a few cigarettes a day.  ?BP Readings from Last 3 Encounters:  ?11/26/21 94/60  ?11/22/21 100/64  ?11/16/21 133/82  ?  ? ?Endorses decreased appetite since being discharged from the hospital. Only able to drink boost supplements. I have recommended he try to increase his protein intake as well. Will need to supplement each meal with boost as he has only been drinking one boost daily and skipping 1-2 meals. States he has been able to smell or taste in a very long time. H ? ? ?HFU ?Admitted from 11-02-2021 through 11-16-2021. He was initially being evaluated after his fall at work since he sustained a right hand laceration however hospital course was complicated by newly diagnosed heart failure, severe electrolyte derangements, Afib with RVR requiring IV diltraizem (sent home on po amio and maintaining NSR), AKI (returned to baseline on discharge), DT (etoh disorder for which he was  prescribed naltrexone upon discharge).  ? ? ?Review of Systems  ?Constitutional:  Positive for weight loss. Negative for fever and malaise/fatigue.  ?     Decreased appetite  ?HENT: Negative.  Negative for nosebleeds.   ?Eyes: Negative.  Negative for blurred vision, double vision and photophobia.  ?Respiratory: Negative.  Negative for cough and shortness of breath.   ?Cardiovascular: Negative.  Negative for chest pain, palpitations and leg swelling.  ?Gastrointestinal: Negative.  Negative for heartburn, nausea and vomiting.  ?  Musculoskeletal:  Positive for joint pain. Negative for myalgias.  ?Neurological:  Positive for weakness. Negative for dizziness, focal weakness, seizures and headaches.  ?Psychiatric/Behavioral: Negative.  Negative for suicidal ideas.   ? ?Past Medical History:  ?Diagnosis Date  ? Hypertension   ? ? ?Past Surgical History:  ?Procedure Laterality Date  ? HERNIA REPAIR    ? INTRAMEDULLARY (IM) NAIL INTERTROCHANTERIC Right  11/11/2014  ? Procedure: INTRAMEDULLARY (IM) NAIL INTERTROCHANTRIC;  Surgeon: Durene Romans, MD;  Location: [facility];  Service: Orthopedics;  Laterality: Right;  ? RIGHT/LEFT HEART CATH AND CORONARY ANGIOGRAPHY N/A 11/11/2021  ? Procedure: RIGHT/LEFT HEART CATH AND CORONARY ANGIOGRAPHY;  Surgeon: Dolores Patty, MD;  Location: [facility];  Service: Cardiovascular;  Laterality: N/A;  ? Rod right leg    ? ? ?Family History  ?Problem Relation Age of Onset  ? Diabetes Mother   ? ? ?Social History Reviewed with no changes to be made today.  ? ?Outpatient Medications Prior to Visit  ?Medication Sig Dispense Refill  ? albuterol (VENTOLIN HFA) 108 (90 Base) MCG/ACT inhaler INHALE 2 PUFFS INTO THE LUNGS EVERY 6 (SIX) HOURS AS NEEDED FOR WHEEZING OR SHORTNESS OF BREATH (COUGH). 18 g 2  ? amiodarone (PACERONE) 200 MG tablet Take 1 tablet (200 mg total) by mouth daily. 30 tablet 0  ? apixaban (ELIQUIS) 5 MG TABS tablet Take 1 tablet (5 mg total) by mouth 2 (two) times daily. 60 tablet 0  ? ferrous sulfate 325 (65 FE) MG tablet Take 1 tablet (325 mg total) by mouth 2 (two) times daily with a meal. 180 tablet 3  ? fluticasone furoate-vilanterol (BREO ELLIPTA) 100-25 MCG/ACT AEPB Inhale 1 puff into the lungs daily. 60 each 2  ? folic acid (FOLVITE) 1 MG tablet Take 1 tablet (1 mg total) by mouth daily. 30 tablet 0  ? furosemide (LASIX) 20 MG tablet Take 1 tablet (20 mg total) by mouth daily as needed for edema or fluid. For weight gain of 3 lbs in 24 hours or 5 lbs in a week 30 tablet 4  ? GLUCOS-CHONDROIT-MSM-C-HYAL PO Take 1 tablet by mouth daily.    ? magnesium oxide (MAG-OX) 400 MG tablet Take 1 tablet (400 mg total) by mouth daily. 90 tablet 3  ? metoprolol succinate (TOPROL-XL) 25 MG 24 hr tablet Take 25 mg by mouth daily.    ? Multiple Vitamin (MULTIVITAMIN WITH MINERALS) TABS tablet Take 1 tablet by mouth daily.    ? naltrexone (DEPADE) 50 MG tablet Take 1 tablet (50 mg total) by mouth daily. 30 tablet 0  ?  naproxen sodium (ALEVE) 220 MG tablet Take 220 mg by mouth 2 (two) times daily as needed (moderate pain).    ? sacubitril-valsartan (ENTRESTO) 97-103 MG Take 1 tablet by mouth 2 (two) times daily. 60 tablet 0  ? spironolactone (ALDACTONE) 25 MG tablet Take 1 tablet (25 mg total) by mouth daily. 30 tablet 0  ? umeclidinium bromide (INCRUSE ELLIPTA) 62.5 MCG/ACT AEPB Inhale 1 puff into the lungs daily. 30 each 0  ? gabapentin (NEURONTIN) 300 MG capsule Take 1 capsule (300 mg total) by mouth at bedtime. (Patient not taking: Reported on 11/22/2021) 90 capsule 0  ? ?No facility-administered medications prior to visit.  ? ? ?No Known Allergies ? ?   ?Objective:  ?  ?BP 94/60   Pulse 88   Wt 184 lb 12.8 oz (83.8 kg)   SpO2 96%   BMI 24.38 kg/m?  ?Wt Readings from Last 3  Encounters:  ?11/26/21 184 lb 12.8 oz (83.8 kg)  ?11/22/21 185 lb (83.9 kg)  ?11/16/21 198 lb 6.6 oz (90 kg)  ? ? ?Physical Exam ?Vitals and nursing note reviewed.  ?Constitutional:   ?   Appearance: He is well-developed.  ?HENT:  ?   Head: Normocephalic and atraumatic.  ?Cardiovascular:  ?   Rate and Rhythm: Regular rhythm. Tachycardia present.  ?   Heart sounds: Normal heart sounds. No murmur heard. ?  No friction rub. No gallop.  ?Pulmonary:  ?   Effort: Pulmonary effort is normal. No tachypnea or respiratory distress.  ?   Breath sounds: Normal breath sounds. No decreased breath sounds, wheezing, rhonchi or rales.  ?Chest:  ?   Chest wall: No tenderness.  ?Abdominal:  ?   General: Bowel sounds are normal.  ?   Palpations: Abdomen is soft.  ?Musculoskeletal:     ?   General: Normal range of motion.  ?   Cervical back: Normal range of motion.  ?   Right lower leg: Tenderness present.  ?Skin: ?   General: Skin is warm and dry.  ?Neurological:  ?   Mental Status: He is alert and oriented to person, place, and time.  ?   Coordination: Coordination normal.  ?   Gait: Gait abnormal (weakness, using straight cane).  ?Psychiatric:     ?   Behavior: Behavior  normal. Behavior is cooperative.     ?   Thought Content: Thought content normal.     ?   Judgment: Judgment normal.  ? ? ? ? ?   ?Patient has been counseled extensively about nutrition and exercise as well as the importance of

## 2021-11-26 NOTE — Telephone Encounter (Signed)
Copied from [place] [id number]. Topic: Referral - Request for Referral ?>> Nov 26, 2021  4:23 PM Tessa Lerner A wrote: ?Has patient seen PCP for this complaint? No. ?*If NO, is insurance requiring patient see PCP for this issue before PCP can refer them? ?Referral for which specialty: Podiatry  ?Preferred provider/office: Patient has no preference  ?Reason for referral: toe nail trimming ?

## 2021-11-27 ENCOUNTER — Other Ambulatory Visit (HOSPITAL_COMMUNITY): Payer: Self-pay

## 2021-11-29 ENCOUNTER — Other Ambulatory Visit: Payer: Self-pay

## 2021-11-29 ENCOUNTER — Other Ambulatory Visit: Payer: Self-pay | Admitting: Family Medicine

## 2021-11-29 MED ORDER — FLUTICASONE FUROATE-VILANTEROL 100-25 MCG/ACT IN AEPB
1.0000 | INHALATION_SPRAY | Freq: Every day | RESPIRATORY_TRACT | 2 refills | Status: DC
  Filled 2021-11-29: qty 60, 30d supply, fill #0

## 2021-11-29 NOTE — Telephone Encounter (Signed)
Pt was referred 11/26/2021 ?

## 2021-12-01 ENCOUNTER — Other Ambulatory Visit (HOSPITAL_COMMUNITY): Payer: Self-pay

## 2021-12-01 MED ORDER — SACUBITRIL-VALSARTAN 97-103 MG PO TABS: 1.0000 | ORAL_TABLET | Freq: Two times a day (BID) | ORAL | 3 refills | Status: DC

## 2021-12-02 NOTE — Telephone Encounter (Signed)
Advanced Heart Failure Patient Advocate Encounter   Patient was approved to receive Eliquis from [facility]  Effective dates: 12/02/21 through 12/02/22  Document scanned to chart.

## 2021-12-03 ENCOUNTER — Other Ambulatory Visit: Payer: Self-pay

## 2021-12-04 LAB — FECAL OCCULT BLOOD, IMMUNOCHEMICAL: Fecal Occult Bld: NEGATIVE

## 2021-12-07 ENCOUNTER — Encounter: Payer: Self-pay | Admitting: Orthopaedic Surgery

## 2021-12-07 ENCOUNTER — Telehealth (HOSPITAL_COMMUNITY): Payer: Self-pay | Admitting: Pharmacist

## 2021-12-07 ENCOUNTER — Ambulatory Visit (INDEPENDENT_AMBULATORY_CARE_PROVIDER_SITE_OTHER): Payer: Medicaid Other | Admitting: Orthopaedic Surgery

## 2021-12-07 ENCOUNTER — Other Ambulatory Visit: Payer: Self-pay

## 2021-12-07 ENCOUNTER — Emergency Department (HOSPITAL_COMMUNITY): Payer: Medicaid Other

## 2021-12-07 ENCOUNTER — Other Ambulatory Visit (HOSPITAL_COMMUNITY): Payer: Self-pay

## 2021-12-07 ENCOUNTER — Inpatient Hospital Stay (HOSPITAL_COMMUNITY)
Admission: EM | Admit: 2021-12-07 | Discharge: 2021-12-10 | DRG: 312 | Disposition: A | Discharge status: Home / Self Care | Payer: Medicaid Other | Attending: Internal Medicine | Admitting: Internal Medicine

## 2021-12-07 ENCOUNTER — Encounter (HOSPITAL_COMMUNITY): Payer: Self-pay | Admitting: Emergency Medicine

## 2021-12-07 VITALS — Ht 73.0 in | Wt 184.8 lb

## 2021-12-07 DIAGNOSIS — R739 Hyperglycemia, unspecified: Secondary | ICD-10-CM | POA: Diagnosis not present

## 2021-12-07 DIAGNOSIS — I959 Hypotension, unspecified: Secondary | ICD-10-CM | POA: Diagnosis not present

## 2021-12-07 DIAGNOSIS — I10 Essential (primary) hypertension: Secondary | ICD-10-CM | POA: Diagnosis not present

## 2021-12-07 DIAGNOSIS — R933 Abnormal findings on diagnostic imaging of other parts of digestive tract: Secondary | ICD-10-CM

## 2021-12-07 DIAGNOSIS — R58 Hemorrhage, not elsewhere classified: Secondary | ICD-10-CM | POA: Diagnosis not present

## 2021-12-07 DIAGNOSIS — Z7951 Long term (current) use of inhaled steroids: Secondary | ICD-10-CM

## 2021-12-07 DIAGNOSIS — F1010 Alcohol abuse, uncomplicated: Secondary | ICD-10-CM | POA: Diagnosis present

## 2021-12-07 DIAGNOSIS — K209 Esophagitis, unspecified without bleeding: Secondary | ICD-10-CM

## 2021-12-07 DIAGNOSIS — E869 Volume depletion, unspecified: Secondary | ICD-10-CM | POA: Diagnosis present

## 2021-12-07 DIAGNOSIS — I428 Other cardiomyopathies: Secondary | ICD-10-CM | POA: Diagnosis present

## 2021-12-07 DIAGNOSIS — K2091 Esophagitis, unspecified with bleeding: Secondary | ICD-10-CM | POA: Diagnosis present

## 2021-12-07 DIAGNOSIS — J439 Emphysema, unspecified: Secondary | ICD-10-CM | POA: Diagnosis present

## 2021-12-07 DIAGNOSIS — R1319 Other dysphagia: Secondary | ICD-10-CM | POA: Diagnosis present

## 2021-12-07 DIAGNOSIS — K449 Diaphragmatic hernia without obstruction or gangrene: Secondary | ICD-10-CM | POA: Diagnosis present

## 2021-12-07 DIAGNOSIS — R8281 Pyuria: Secondary | ICD-10-CM | POA: Diagnosis present

## 2021-12-07 DIAGNOSIS — N179 Acute kidney failure, unspecified: Secondary | ICD-10-CM | POA: Diagnosis present

## 2021-12-07 DIAGNOSIS — I5022 Chronic systolic (congestive) heart failure: Secondary | ICD-10-CM | POA: Diagnosis present

## 2021-12-07 DIAGNOSIS — W19XXXA Unspecified fall, initial encounter: Secondary | ICD-10-CM | POA: Diagnosis present

## 2021-12-07 DIAGNOSIS — R55 Syncope and collapse: Secondary | ICD-10-CM | POA: Diagnosis present

## 2021-12-07 DIAGNOSIS — G629 Polyneuropathy, unspecified: Secondary | ICD-10-CM

## 2021-12-07 DIAGNOSIS — I2510 Atherosclerotic heart disease of native coronary artery without angina pectoris: Secondary | ICD-10-CM | POA: Diagnosis present

## 2021-12-07 DIAGNOSIS — S0101XA Laceration without foreign body of scalp, initial encounter: Secondary | ICD-10-CM | POA: Diagnosis present

## 2021-12-07 DIAGNOSIS — M25462 Effusion, left knee: Secondary | ICD-10-CM | POA: Diagnosis present

## 2021-12-07 DIAGNOSIS — M25551 Pain in right hip: Secondary | ICD-10-CM | POA: Diagnosis not present

## 2021-12-07 DIAGNOSIS — E876 Hypokalemia: Secondary | ICD-10-CM | POA: Diagnosis present

## 2021-12-07 DIAGNOSIS — Z79899 Other long term (current) drug therapy: Secondary | ICD-10-CM

## 2021-12-07 DIAGNOSIS — I5083 High output heart failure: Secondary | ICD-10-CM | POA: Diagnosis present

## 2021-12-07 DIAGNOSIS — I951 Orthostatic hypotension: Principal | ICD-10-CM | POA: Diagnosis present

## 2021-12-07 DIAGNOSIS — F17210 Nicotine dependence, cigarettes, uncomplicated: Secondary | ICD-10-CM | POA: Diagnosis present

## 2021-12-07 DIAGNOSIS — K2970 Gastritis, unspecified, without bleeding: Secondary | ICD-10-CM | POA: Diagnosis present

## 2021-12-07 DIAGNOSIS — S299XXA Unspecified injury of thorax, initial encounter: Secondary | ICD-10-CM | POA: Diagnosis not present

## 2021-12-07 DIAGNOSIS — I480 Paroxysmal atrial fibrillation: Secondary | ICD-10-CM | POA: Diagnosis present

## 2021-12-07 DIAGNOSIS — Z7901 Long term (current) use of anticoagulants: Secondary | ICD-10-CM

## 2021-12-07 DIAGNOSIS — Z043 Encounter for examination and observation following other accident: Secondary | ICD-10-CM | POA: Diagnosis not present

## 2021-12-07 DIAGNOSIS — F17200 Nicotine dependence, unspecified, uncomplicated: Secondary | ICD-10-CM

## 2021-12-07 DIAGNOSIS — S0990XA Unspecified injury of head, initial encounter: Secondary | ICD-10-CM | POA: Diagnosis not present

## 2021-12-07 DIAGNOSIS — I110 Hypertensive heart disease with heart failure: Secondary | ICD-10-CM | POA: Diagnosis present

## 2021-12-07 HISTORY — DX: Syncope and collapse: R55

## 2021-12-07 LAB — COMPREHENSIVE METABOLIC PANEL
ALT: 16 U/L (ref 0–44)
AST: 37 U/L (ref 15–41)
Albumin: 2.8 g/dL — ABNORMAL LOW (ref 3.5–5.0)
Alkaline Phosphatase: 73 U/L (ref 38–126)
Anion gap: 12 (ref 5–15)
BUN: 17 mg/dL (ref 8–23)
CO2: 26 mmol/L (ref 22–32)
Calcium: 11.9 mg/dL — ABNORMAL HIGH (ref 8.9–10.3)
Chloride: 97 mmol/L — ABNORMAL LOW (ref 98–111)
Creatinine, Ser: 1.64 mg/dL — ABNORMAL HIGH (ref 0.61–1.24)
GFR, Estimated: 47 mL/min — ABNORMAL LOW (ref >60)
Glucose, Bld: 106 mg/dL — ABNORMAL HIGH (ref 70–99)
Potassium: 3 mmol/L — ABNORMAL LOW (ref 3.5–5.1)
Sodium: 135 mmol/L (ref 135–145)
Total Bilirubin: 0.8 mg/dL (ref 0.3–1.2)
Total Protein: 5.8 g/dL — ABNORMAL LOW (ref 6.5–8.1)

## 2021-12-07 LAB — ETHANOL: Alcohol, Ethyl (B): 116 mg/dL — ABNORMAL HIGH (ref <10)

## 2021-12-07 LAB — CBC WITH DIFFERENTIAL/PLATELET
Abs Immature Granulocytes: 0.03 10*3/uL (ref 0.00–0.07)
Basophils Absolute: 0 10*3/uL (ref 0.0–0.1)
Basophils Relative: 1 %
Eosinophils Absolute: 0.1 10*3/uL (ref 0.0–0.5)
Eosinophils Relative: 2 %
HCT: 36 % — ABNORMAL LOW (ref 39.0–52.0)
Hemoglobin: 12.4 g/dL — ABNORMAL LOW (ref 13.0–17.0)
Immature Granulocytes: 0 %
Lymphocytes Relative: 20 %
Lymphs Abs: 1.7 10*3/uL (ref 0.7–4.0)
MCH: 34.5 pg — ABNORMAL HIGH (ref 26.0–34.0)
MCHC: 34.4 g/dL (ref 30.0–36.0)
MCV: 100.3 fL — ABNORMAL HIGH (ref 80.0–100.0)
Monocytes Absolute: 0.7 10*3/uL (ref 0.1–1.0)
Monocytes Relative: 9 %
Neutro Abs: 5.7 10*3/uL (ref 1.7–7.7)
Neutrophils Relative %: 68 %
Platelets: 256 10*3/uL (ref 150–400)
RBC: 3.59 MIL/uL — ABNORMAL LOW (ref 4.22–5.81)
RDW: 13.9 % (ref 11.5–15.5)
WBC: 8.3 10*3/uL (ref 4.0–10.5)
nRBC: 0 % (ref 0.0–0.2)

## 2021-12-07 LAB — TROPONIN I (HIGH SENSITIVITY)
Troponin I (High Sensitivity): 28 ng/L — ABNORMAL HIGH (ref <18)
Troponin I (High Sensitivity): 30 ng/L — ABNORMAL HIGH (ref <18)

## 2021-12-07 LAB — MAGNESIUM: Magnesium: 1.8 mg/dL (ref 1.7–2.4)

## 2021-12-07 MED ORDER — LACTATED RINGERS IV BOLUS: 1000.0000 mL | Freq: Once | INTRAVENOUS | Status: DC

## 2021-12-07 MED ORDER — POTASSIUM CHLORIDE CRYS ER 20 MEQ PO TBCR
40.0000 meq | EXTENDED_RELEASE_TABLET | Freq: Once | ORAL | Status: AC
  Administered 2021-12-07: 40 meq via ORAL
  Filled 2021-12-07: qty 2

## 2021-12-07 MED ORDER — MAGNESIUM OXIDE -MG SUPPLEMENT 400 (240 MG) MG PO TABS
800.0000 mg | ORAL_TABLET | Freq: Once | ORAL | Status: AC
  Administered 2021-12-07: 800 mg via ORAL
  Filled 2021-12-07: qty 2

## 2021-12-07 MED ORDER — LACTATED RINGERS IV BOLUS
500.0000 mL | Freq: Once | INTRAVENOUS | Status: AC
Start: 2021-12-07 — End: 2021-12-07
  Administered 2021-12-07: 500 mL via INTRAVENOUS

## 2021-12-07 NOTE — ED Provider Notes (Addendum)
[facility] EMERGENCY DEPARTMENT Provider Note  CSN: [id number] Arrival date & time: 12/07/21 1957  Chief Complaint(s) Fall  [name] is a 61 y.o. 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  Patient states that he got out of the car today and woke up in the hospital.  Additional history obtained from patient's friend who states that the patient was in his usual state of health and he looked away for 1 second and found the patient on the ground.  He did hear the patient strike his head hard on the ground and the patient arrives with a posterior occiput laceration.  Currently denies chest pain, shortness of breath, abdominal pain, nausea, vomiting but arrives with a systolic blood pressure in the 80s   Past Medical History Past Medical History:  Diagnosis Date   Hypertension    Patient Active Problem List   Diagnosis Date Noted   Chronic systolic heart failure ([place]) 11/22/2021   Acute HFrEF (heart failure with reduced ejection fraction) ([place]) 11/04/2021   Atrial fibrillation with RVR (HCC)    Hypokalemia 11/03/2021   Solitary pulmonary nodule 11/03/2021   Emphysema lung ([place]) 11/03/2021   Hand laceration 11/03/2021   Essential hypertension 11/03/2021   Alcohol withdrawal ([place]) 11/03/2021   AKI (acute kidney injury) ([place])    Hypercalcemia 11/02/2021   Delirium tremens ([place]) 11/11/2014   Intertrochanteric fracture of right femur ([place]) 11/11/2014   Fracture, intertrochanteric, right femur ([place]) 11/11/2014   Anemia 11/11/2014   Home Medication(s) Prior to Admission medications   Medication Sig Start Date End Date Taking? Authorizing Provider  albuterol (VENTOLIN HFA) 108 (90 Base) MCG/ACT inhaler INHALE 2 PUFFS INTO THE LUNGS EVERY 6 (SIX) HOURS AS  NEEDED FOR WHEEZING OR SHORTNESS OF BREATH (COUGH). 05/10/21   Gildardo Pounds, NP  amiodarone (PACERONE) 200 MG tablet Take 1 tablet (200 mg total) by mouth daily. 11/16/21   Lajean Manes, MD  apixaban (ELIQUIS) 5 MG TABS tablet Take 1 tablet (5 mg total) by mouth 2 (two) times daily. 11/12/21   Masters, Joellen Jersey, DO  ferrous sulfate 325 (65 FE) MG tablet Take 1 tablet (325 mg total) by mouth 2 (two) times daily with a meal. 03/05/20 11/23/22  Gildardo Pounds, NP  fluticasone furoate-vilanterol (BREO ELLIPTA) 100-25 MCG/ACT AEPB Inhale 1 puff into the lungs daily. 11/29/21   Charlott Rakes, MD  folic acid (FOLVITE) 1 MG tablet Take 1 tablet (1 mg total) by mouth daily. 11/13/21   Masters, Katie, DO  furosemide (LASIX) 20 MG tablet Take 1 tablet (20 mg total) by mouth daily as needed for edema or fluid. For weight gain of 3 lbs in 24 hours or 5 lbs in a week 11/22/21   [place], Maricela Bo, FNP  gabapentin (NEURONTIN) 300 MG capsule Take 1 capsule (300 mg total) by mouth at bedtime. 11/26/21 02/24/22  Gildardo Pounds, NP  GLUCOS-CHONDROIT-MSM-C-HYAL PO Take 1 tablet by mouth daily.    [provider]  magnesium oxide (MAG-OX) 400 MG tablet Take 1 tablet (400 mg total) by mouth daily. 11/24/21   [place], Maricela Bo, FNP  metoprolol succinate (TOPROL-XL) 25 MG 24 hr tablet Take 25 mg by mouth daily.    [provider]  Multiple Vitamin (MULTIVITAMIN WITH MINERALS) TABS tablet Take 1 tablet by mouth daily.  [provider]  naltrexone (DEPADE) 50 MG tablet Take 1 tablet (50 mg total) by mouth daily. 11/12/21   Masters, Katie, DO  naproxen sodium (ALEVE) 220 MG tablet Take 220 mg by mouth 2 (two) times daily as needed (moderate pain).    [provider]  sacubitril-valsartan (ENTRESTO) 97-103 MG Take 1 tablet by mouth 2 (two) times daily. 12/01/21   Bensimhon, Shaune Pascal, MD  spironolactone (ALDACTONE) 25 MG tablet Take 1 tablet (25 mg total) by mouth daily. 11/13/21   Masters, Katie, DO   umeclidinium bromide (INCRUSE ELLIPTA) 62.5 MCG/ACT AEPB Inhale 1 puff into the lungs daily. 11/17/21   Lajean Manes, MD                                                                                                                                    Past Surgical History Past Surgical History:  Procedure Laterality Date   HERNIA REPAIR     INTRAMEDULLARY (IM) NAIL INTERTROCHANTERIC Right 11/11/2014   Procedure: INTRAMEDULLARY (IM) NAIL INTERTROCHANTRIC;  Surgeon: Paralee Cancel, MD;  Location: [facility];  Service: Orthopedics;  Laterality: Right;   RIGHT/LEFT HEART CATH AND CORONARY ANGIOGRAPHY N/A 11/11/2021   Procedure: RIGHT/LEFT HEART CATH AND CORONARY ANGIOGRAPHY;  Surgeon: Jolaine Artist, MD;  Location: [place] [facility];  Service: Cardiovascular;  Laterality: N/A;   Rod right leg     Family History Family History  Problem Relation Age of Onset   Diabetes Mother     Social History Social History   Tobacco Use   Smoking status: Some Days    Packs/day: 0.25    Types: Cigarettes    Passive exposure: Never   Smokeless tobacco: Never  Vaping Use   Vaping Use: Never used  Substance Use Topics   Alcohol use: Not Currently   Drug use: Not Currently   Allergies Patient has no known allergies.  Review of Systems Review of Systems  Neurological:  Positive for syncope.   Physical Exam Vital Signs  I have reviewed the triage vital signs BP 100/66   Pulse 88   Temp 97.8 F (36.6 C) (Oral)   Resp 19   SpO2 96%   Physical Exam Constitutional:      General: He is not in acute distress.    Appearance: Normal appearance.  HENT:     Head: Normocephalic.     Nose: No congestion or rhinorrhea.  Eyes:     General:        Right eye: No discharge.        Left eye: No discharge.     Extraocular Movements: Extraocular movements intact.     Pupils: Pupils are equal, round, and reactive to light.  Cardiovascular:     Rate and Rhythm: Normal rate and regular rhythm.      Heart sounds: No murmur heard. Pulmonary:     Effort: No respiratory distress.  Breath sounds: No wheezing or rales.  Abdominal:     General: There is no distension.     Tenderness: There is no abdominal tenderness.  Musculoskeletal:        General: Normal range of motion.     Cervical back: Normal range of motion.  Skin:    General: Skin is warm and dry.     Findings: Lesion (2 cm posterior occipital laceration) present.  Neurological:     General: No focal deficit present.     Mental Status: He is alert.    ED Results and Treatments Labs (all labs ordered are listed, but only abnormal results are displayed) Labs Reviewed  COMPREHENSIVE METABOLIC PANEL - Abnormal; Notable for the following components:      Result Value   Potassium 3.0 (*)    Chloride 97 (*)    Glucose, Bld 106 (*)    Creatinine, Ser 1.64 (*)    Calcium 11.9 (*)    Total Protein 5.8 (*)    Albumin 2.8 (*)    GFR, Estimated 47 (*)    All other components within normal limits  CBC WITH DIFFERENTIAL/PLATELET - Abnormal; Notable for the following components:   RBC 3.59 (*)    Hemoglobin 12.4 (*)    HCT 36.0 (*)    MCV 100.3 (*)    MCH 34.5 (*)    All other components within normal limits  ETHANOL - Abnormal; Notable for the following components:   Alcohol, Ethyl (B) 116 (*)    All other components within normal limits  TROPONIN I (HIGH SENSITIVITY) - Abnormal; Notable for the following components:   Troponin I (High Sensitivity) 28 (*)    All other components within normal limits  URINALYSIS, ROUTINE W REFLEX MICROSCOPIC  RAPID URINE DRUG SCREEN, HOSP PERFORMED  TROPONIN I (HIGH SENSITIVITY)                                                                                                                          Radiology CT Head Wo Contrast  Result Date: 12/07/2021 CLINICAL DATA:  Status post fall. EXAM: CT HEAD WITHOUT CONTRAST TECHNIQUE: Contiguous axial images were obtained from the base of the  skull through the vertex without intravenous contrast. RADIATION DOSE REDUCTION: This exam was performed according to the departmental dose-optimization program which includes automated exposure control, adjustment of the mA and/or kV according to patient size and/or use of iterative reconstruction technique. COMPARISON:  November 02, 2021 FINDINGS: Brain: There is mild cerebral atrophy with widening of the extra-axial spaces and ventricular dilatation. There are areas of decreased attenuation within the white matter tracts of the supratentorial brain, consistent with microvascular disease changes. Vascular: No hyperdense vessel or unexpected calcification. Skull: Normal. Negative for fracture or focal lesion. Sinuses/Orbits: No acute finding. Other: None. IMPRESSION: 1. No acute intracranial abnormality. 2. Generalized cerebral atrophy with chronic white matter small vessel ischemic changes. Electronically Signed   By: Virgina Norfolk M.D.   On:  12/07/2021 22:22   CT Cervical Spine Wo Contrast  Result Date: 12/07/2021 CLINICAL DATA:  Status post fall. EXAM: CT CERVICAL SPINE WITHOUT CONTRAST TECHNIQUE: Multidetector CT imaging of the cervical spine was performed without intravenous contrast. Multiplanar CT image reconstructions were also generated. RADIATION DOSE REDUCTION: This exam was performed according to the departmental dose-optimization program which includes automated exposure control, adjustment of the mA and/or kV according to patient size and/or use of iterative reconstruction technique. COMPARISON:  None Available. FINDINGS: Alignment: There is reversal of the normal cervical spine lordosis with 3.5 mm anterolisthesis of the C2 vertebral body on C3. Approximately 3 mm retrolisthesis of C4 is noted on C5. Skull base and vertebrae: No acute fracture. No primary bone lesion or focal pathologic process. Soft tissues and spinal canal: No prevertebral fluid or swelling. No visible canal hematoma. Disc  levels: There is marked severity endplate sclerosis and anterior osteophyte formation at the levels of C3-C4, C4-C5, C5-C6 and C6-C7. Marked severity intervertebral disc space narrowing is also seen at these levels. Bilateral marked severity multilevel facet joint hypertrophy is noted. Upper chest: Negative. Other: None. IMPRESSION: 1. Marked severity multilevel degenerative changes without evidence of an acute fracture. Electronically Signed   By: Virgina Norfolk M.D.   On: 12/07/2021 22:25   DG Chest Portable 1 View  Result Date: 12/07/2021 CLINICAL DATA:  Fall, chest injury EXAM: PORTABLE CHEST 1 VIEW COMPARISON:  01/07/2021 FINDINGS: The lungs appear hyperinflated in keeping with changes of underlying COPD. The lungs are clear. No pneumothorax or pleural effusion. Cardiac size within normal limits. Pulmonary vascularity is normal. No acute bone abnormality. Healed rib fractures are seen bilaterally. IMPRESSION: No radiographic evidence of acute cardiopulmonary disease. Electronically Signed   By: Fidela Salisbury M.D.   On: 12/07/2021 20:24    Pertinent labs & imaging results that were available during my care of the patient were reviewed by me and considered in my medical decision making (see MDM for details).  Medications Ordered in ED Medications  lactated ringers bolus 500 mL (0 mLs Intravenous Stopped 12/07/21 2054)                                                                                                                                     Procedures .Critical Care Performed by: Teressa Lower, MD Authorized by: Teressa Lower, MD   Critical care provider statement:    Critical care time (minutes):  30   Critical care was necessary to treat or prevent imminent or life-threatening deterioration of the following conditions:  Circulatory failure   Critical care was time spent personally by me on the following activities:  Development of treatment plan with patient or surrogate,  discussions with consultants, evaluation of patient's response to treatment, examination of patient, ordering and review of laboratory studies, ordering and review of radiographic studies, ordering and performing treatments and interventions, pulse oximetry, re-evaluation of patient's condition and review of  old charts .Marland KitchenLaceration Repair  Date/Time: 12/07/2021 10:48 PM Performed by: Glendora Score, MD Authorized by: Glendora Score, MD   Laceration details:    Location:  Scalp   Scalp location:  Occipital   Length (cm):  2 Pre-procedure details:    Preparation:  Patient was prepped and draped in usual sterile fashion Treatment:    Area cleansed with:  Saline   Amount of cleaning:  Standard   Irrigation method:  Pressure wash Skin repair:    Repair method:  Staples Approximation:    Approximation:  Close  (including critical care time)  Medical Decision Making / ED Course   This patient presents to the ED for concern of syncope, head injury, this involves an extensive number of treatment options, and is a complaint that carries with it a high risk of complications and morbidity.  The differential diagnosis includes cardiogenic syncope, vasovagal syncope, medication side effect, closed head injury, skull fracture, ICH  MDM: Patient seen emergency room for evaluation of syncope and head injury.  Physical exam with a 2 cm laceration to the posterior occiput but is otherwise unremarkable.  Cardiopulmonary exam unremarkable.  Laboratory evaluation with hypokalemia to 3.0, creatinine 1.64 which is an elevation from patient's baseline, hemoglobin 12.4 with an MCV of 100.3, initial troponin 28, alcohol level 116.  For patient's hypotension, 500 cc of lactated Ringer's was administered and his blood pressure improved.  ECG nonischemic with no evidence of arrhythmia.  Trauma imaging of the chest, CT head and C-spine with no acute traumatic injury.  Laceration repaired with staples.  I spoke with  Dr. Rosita Fire of cardiology who shares my concern that the patient may have suffered cardiogenic syncope today given total amnesia around today syncopal event.  Patient will require admission for repeat echo and high risk syncope.  Cardiology will round routinely on the patient tomorrow.   Additional history obtained: -Additional history obtained from patient's friend Apolinar Junes -External records from outside source obtained and reviewed including: Chart review including previous notes, labs, imaging, consultation notes   Lab Tests: -I ordered, reviewed, and interpreted labs.   The pertinent results include:   Labs Reviewed  COMPREHENSIVE METABOLIC PANEL - Abnormal; Notable for the following components:      Result Value   Potassium 3.0 (*)    Chloride 97 (*)    Glucose, Bld 106 (*)    Creatinine, Ser 1.64 (*)    Calcium 11.9 (*)    Total Protein 5.8 (*)    Albumin 2.8 (*)    GFR, Estimated 47 (*)    All other components within normal limits  CBC WITH DIFFERENTIAL/PLATELET - Abnormal; Notable for the following components:   RBC 3.59 (*)    Hemoglobin 12.4 (*)    HCT 36.0 (*)    MCV 100.3 (*)    MCH 34.5 (*)    All other components within normal limits  ETHANOL - Abnormal; Notable for the following components:   Alcohol, Ethyl (B) 116 (*)    All other components within normal limits  TROPONIN I (HIGH SENSITIVITY) - Abnormal; Notable for the following components:   Troponin I (High Sensitivity) 28 (*)    All other components within normal limits  URINALYSIS, ROUTINE W REFLEX MICROSCOPIC  RAPID URINE DRUG SCREEN, HOSP PERFORMED  TROPONIN I (HIGH SENSITIVITY)      EKG   EKG Interpretation  Date/Time:  Tuesday Dec 07 2021 20:01:15 EDT Ventricular Rate:  99 PR Interval:  150 QRS Duration: 157 QT Interval:  395 QTC Calculation: 507 R Axis:   42 Text Interpretation: Sinus Right bundle branch block Confirmed by Emalina Dubreuil ([id number]) on 12/07/2021 10:49:13 PM         Imaging  Studies ordered: I ordered imaging studies including CT head, C-spine, chest x-ray I independently visualized and interpreted imaging. I agree with the radiologist interpretation   Medicines ordered and prescription drug management: Meds ordered this encounter  Medications   DISCONTD: lactated ringers bolus 1,000 mL   lactated ringers bolus 500 mL    -I have reviewed the patients home medicines and have made adjustments as needed  Critical interventions Fluid resuscitation  Consultations Obtained: I requested consultation with the cardiologist Dr. Alfred Levins,  and discussed lab and imaging findings as well as pertinent plan - they recommend: Observation admission and repeat echo   Cardiac Monitoring: The patient was maintained on a cardiac monitor.  I personally viewed and interpreted the cardiac monitored which showed an underlying rhythm of: NSR  Social Determinants of Health:  Factors impacting patients care include: Alcohol use   Reevaluation: After the interventions noted above, I reevaluated the patient and found that they have :improved  Co morbidities that complicate the patient evaluation  Past Medical History:  Diagnosis Date   Hypertension       Dispostion: I considered admission for this patient, and due to high risk syncope patient will require admission     Final Clinical Impression(s) / ED Diagnoses Final diagnoses:  None     @PCDICTATION @    Teressa Lower, MD 12/07/21 [place], [place], MD 12/07/21 2303

## 2021-12-07 NOTE — ED Triage Notes (Signed)
Pt BIB [facility] from home, had a fall getting out of the car this evening. Pt does not remember the fall. Lac to the back of his head, bleeding controlled at this time. GCS 15. Pt does not remember fall. +eliquis

## 2021-12-07 NOTE — Telephone Encounter (Signed)
Pharmacy Transitions of Care Follow-up Telephone Call  Date of discharge: 11/12/2021   How have you been since you were released from the hospital? Good   Medication changes made at discharge:  - START: amidoarone, apixaban, entresto, folic acid, metoprolol succinate, naltrexone, spironolactone, Incruse  - STOPPED: amlodipine, lisinopril  Medication changes verified by the patient? Yes (Yes/No)    Medication Accessibility:  Home Pharmacy: [facility] [place] [facility] [place]   Was the patient provided with refills on discharged medications? No   Have all prescriptions been transferred from [facility] to home pharmacy? NA     Medication Review:   APIXABAN (ELIQUIS)  Apixaban 10 mg BID initiated on 11/12/21. Will switch to apixaban 5 mg BID after 7 days (DATE05/05).  - Discussed importance of taking medication around the same time everyday  - Reviewed potential DDIs with patient  - Advised patient of medications to avoid (NSAIDs, ASA)  - Educated that Tylenol (acetaminophen) will be the preferred analgesic to prevent risk of bleeding  - Emphasized importance of monitoring for signs and symptoms of bleeding (abnormal bruising, prolonged bleeding, nose bleeds, bleeding from gums, discolored urine, black tarry stools)  - Advised patient to alert all providers of anticoagulation therapy prior to starting a new medication or having a procedure   Follow-up Appointments:  Pt is aware of f/u appts.  If their condition worsens, is the pt aware to call PCP or go to the Emergency Dept.? Yes  Final Patient Assessment: Patient reports he is doing fine since discharge.  Medications reviewed.

## 2021-12-07 NOTE — Progress Notes (Signed)
Office Visit Note   Patient: Luis Marquez           Date of Birth: 1961-03-27           MRN: [id number] Visit Date: 12/07/2021              Requested by: Luis Pounds, NP [address] [place],  [place] [address] PCP: Luis Pounds, NP   Assessment & Plan: Visit Diagnoses:  1. Pain in right hip     Plan: Impression is right hip pain.  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  He understands that is normal to see that the lag screw protrude a little bit as a fracture settles during healing.  Treatment options were discussed and ultimately I think he will need hardware removal and conversion to a hip replacement but currently he lacks health insurance and I think he would not be able to get all the resources that he needs for postoperative care.  In the meantime we will get him set up for cortisone injection in his hip which will hopefully give him some relief.  We will see him back as needed.  Follow-Up Instructions: No follow-ups on file.   Orders:  Orders Placed This Encounter  Procedures   Ambulatory referral to Physical Medicine Rehab   No orders of the defined types were placed in this encounter.     Procedures: No procedures performed   Clinical Data: No additional findings.   Subjective: Chief Complaint  Patient presents with   Right Hip - Pain    [name] is a 61 year old gentleman who comes in for chronic severe right hip pain.  Had a reverse obliquity intertrochanteric fracture in 2016 that was surgically repaired by Dr. Alvan Dame.  He recovered well from the surgery.  He is currently unemployed.  He feels pain to the groin and lateral hip and deep in the buttock area.  Currently applying for disability.  Has not had any physical therapy or injections. Review of Systems  Constitutional: Negative.   All other systems reviewed and are negative.   Objective: Vital Signs: Ht 6\' 1"  (1.854 m)    Wt 184 lb 12.8 oz (83.8 kg)   BMI 24.38 kg/m   Physical Exam Vitals and nursing note reviewed.  Constitutional:      Appearance: He is well-developed.  HENT:     Head: Normocephalic and atraumatic.  Eyes:     Pupils: Pupils are equal, round, and reactive to light.  Pulmonary:     Effort: Pulmonary effort is normal.  Abdominal:     Palpations: Abdomen is soft.  Musculoskeletal:        General: Normal range of motion.     Cervical back: Neck supple.  Skin:    General: Skin is warm.  Neurological:     Mental Status: He is alert and oriented to person, place, and time.  Psychiatric:        Behavior: Behavior normal.        Thought Content: Thought content normal.        Judgment: Judgment normal.    Ortho Exam Examination of right hip shows a fully healed surgical scars.  I cannot feel any hardware prominence.  He has almost no range of motion of the hip.  Severe pain with any attempted range of motion or flexion.  Antalgic gait. Specialty Comments:  No specialty comments available.  Imaging: No  results found.   PMFS History: Patient Active Problem List   Diagnosis Date Noted   Chronic systolic heart failure ([place]) 11/22/2021   Acute HFrEF (heart failure with reduced ejection fraction) ([place]) 11/04/2021   Atrial fibrillation with RVR (HCC)    Hypokalemia 11/03/2021   Solitary pulmonary nodule 11/03/2021   Emphysema lung ([place]) 11/03/2021   Hand laceration 11/03/2021   Essential hypertension 11/03/2021   Alcohol withdrawal ([place]) 11/03/2021   AKI (acute kidney injury) ([place])    Hypercalcemia 11/02/2021   Delirium tremens ([place]) 11/11/2014   Intertrochanteric fracture of right femur ([place]) 11/11/2014   Fracture, intertrochanteric, right femur ([place]) 11/11/2014   Anemia 11/11/2014   Past Medical History:  Diagnosis Date   Hypertension     Family History  Problem Relation Age of Onset   Diabetes Mother     Past Surgical History:  Procedure Laterality Date   HERNIA  REPAIR     INTRAMEDULLARY (IM) NAIL INTERTROCHANTERIC Right 11/11/2014   Procedure: INTRAMEDULLARY (IM) NAIL INTERTROCHANTRIC;  Surgeon: Paralee Cancel, MD;  Location: [facility];  Service: Orthopedics;  Laterality: Right;   RIGHT/LEFT HEART CATH AND CORONARY ANGIOGRAPHY N/A 11/11/2021   Procedure: RIGHT/LEFT HEART CATH AND CORONARY ANGIOGRAPHY;  Surgeon: Jolaine Artist, MD;  Location: [place] [facility];  Service: Cardiovascular;  Laterality: N/A;   Rod right leg     Social History   Occupational History   Not on file  Tobacco Use   Smoking status: Some Days    Packs/day: 0.25    Types: Cigarettes    Passive exposure: Never   Smokeless tobacco: Never  Vaping Use   Vaping Use: Never used  Substance and Sexual Activity   Alcohol use: Not Currently   Drug use: Not Currently   Sexual activity: Yes

## 2021-12-08 ENCOUNTER — Encounter (HOSPITAL_COMMUNITY): Payer: Self-pay | Admitting: Internal Medicine

## 2021-12-08 DIAGNOSIS — J439 Emphysema, unspecified: Secondary | ICD-10-CM | POA: Diagnosis not present

## 2021-12-08 DIAGNOSIS — I10 Essential (primary) hypertension: Secondary | ICD-10-CM | POA: Diagnosis not present

## 2021-12-08 DIAGNOSIS — I5022 Chronic systolic (congestive) heart failure: Secondary | ICD-10-CM

## 2021-12-08 DIAGNOSIS — E876 Hypokalemia: Secondary | ICD-10-CM | POA: Diagnosis not present

## 2021-12-08 DIAGNOSIS — I480 Paroxysmal atrial fibrillation: Secondary | ICD-10-CM | POA: Diagnosis not present

## 2021-12-08 DIAGNOSIS — F1010 Alcohol abuse, uncomplicated: Secondary | ICD-10-CM

## 2021-12-08 DIAGNOSIS — N179 Acute kidney failure, unspecified: Secondary | ICD-10-CM

## 2021-12-08 DIAGNOSIS — R55 Syncope and collapse: Secondary | ICD-10-CM

## 2021-12-08 LAB — CBC WITH DIFFERENTIAL/PLATELET
Abs Immature Granulocytes: 0.03 10*3/uL (ref 0.00–0.07)
Basophils Absolute: 0 10*3/uL (ref 0.0–0.1)
Basophils Relative: 0 %
Eosinophils Absolute: 0.1 10*3/uL (ref 0.0–0.5)
Eosinophils Relative: 1 %
HCT: 36.1 % — ABNORMAL LOW (ref 39.0–52.0)
Hemoglobin: 12.7 g/dL — ABNORMAL LOW (ref 13.0–17.0)
Immature Granulocytes: 0 %
Lymphocytes Relative: 17 %
Lymphs Abs: 1.5 10*3/uL (ref 0.7–4.0)
MCH: 34.1 pg — ABNORMAL HIGH (ref 26.0–34.0)
MCHC: 35.2 g/dL (ref 30.0–36.0)
MCV: 97 fL (ref 80.0–100.0)
Monocytes Absolute: 0.6 10*3/uL (ref 0.1–1.0)
Monocytes Relative: 7 %
Neutro Abs: 6.4 10*3/uL (ref 1.7–7.7)
Neutrophils Relative %: 75 %
Platelets: 224 10*3/uL (ref 150–400)
RBC: 3.72 MIL/uL — ABNORMAL LOW (ref 4.22–5.81)
RDW: 13.8 % (ref 11.5–15.5)
WBC: 8.6 10*3/uL (ref 4.0–10.5)
nRBC: 0 % (ref 0.0–0.2)

## 2021-12-08 LAB — MAGNESIUM: Magnesium: 1.6 mg/dL — ABNORMAL LOW (ref 1.7–2.4)

## 2021-12-08 LAB — URINALYSIS, ROUTINE W REFLEX MICROSCOPIC
Bilirubin Urine: NEGATIVE
Glucose, UA: NEGATIVE mg/dL
Hgb urine dipstick: NEGATIVE
Ketones, ur: NEGATIVE mg/dL
Nitrite: NEGATIVE
Protein, ur: NEGATIVE mg/dL
Specific Gravity, Urine: 1.013 (ref 1.005–1.030)
pH: 5 (ref 5.0–8.0)

## 2021-12-08 LAB — RAPID URINE DRUG SCREEN, HOSP PERFORMED
Amphetamines: NOT DETECTED
Barbiturates: NOT DETECTED
Benzodiazepines: POSITIVE — AB
Cocaine: NOT DETECTED
Opiates: NOT DETECTED
Tetrahydrocannabinol: NOT DETECTED

## 2021-12-08 LAB — COMPREHENSIVE METABOLIC PANEL
ALT: 19 U/L (ref 0–44)
AST: 35 U/L (ref 15–41)
Albumin: 2.8 g/dL — ABNORMAL LOW (ref 3.5–5.0)
Alkaline Phosphatase: 68 U/L (ref 38–126)
Anion gap: 11 (ref 5–15)
BUN: 14 mg/dL (ref 8–23)
CO2: 28 mmol/L (ref 22–32)
Calcium: 12 mg/dL — ABNORMAL HIGH (ref 8.9–10.3)
Chloride: 97 mmol/L — ABNORMAL LOW (ref 98–111)
Creatinine, Ser: 1.3 mg/dL — ABNORMAL HIGH (ref 0.61–1.24)
GFR, Estimated: >60 mL/min (ref >60)
Glucose, Bld: 102 mg/dL — ABNORMAL HIGH (ref 70–99)
Potassium: 4 mmol/L (ref 3.5–5.1)
Sodium: 136 mmol/L (ref 135–145)
Total Bilirubin: 0.9 mg/dL (ref 0.3–1.2)
Total Protein: 5.9 g/dL — ABNORMAL LOW (ref 6.5–8.1)

## 2021-12-08 MED ORDER — THIAMINE HCL 100 MG PO TABS
100.0000 mg | ORAL_TABLET | Freq: Every day | ORAL | Status: DC
  Administered 2021-12-08 – 2021-12-10 (×3): 100 mg via ORAL
  Filled 2021-12-08 (×3): qty 1

## 2021-12-08 MED ORDER — ACETAMINOPHEN 650 MG RE SUPP: 650.0000 mg | Freq: Four times a day (QID) | RECTAL | Status: DC | PRN

## 2021-12-08 MED ORDER — ACETAMINOPHEN 325 MG PO TABS
650.0000 mg | ORAL_TABLET | Freq: Four times a day (QID) | ORAL | Status: DC | PRN
  Administered 2021-12-08: 650 mg via ORAL
  Filled 2021-12-08: qty 2

## 2021-12-08 MED ORDER — LACTATED RINGERS IV SOLN: INTRAVENOUS | Status: DC

## 2021-12-08 MED ORDER — LORAZEPAM 2 MG/ML IJ SOLN: 0.0000 mg | Freq: Two times a day (BID) | INTRAMUSCULAR | Status: DC

## 2021-12-08 MED ORDER — ADULT MULTIVITAMIN W/MINERALS CH
1.0000 | ORAL_TABLET | Freq: Every day | ORAL | Status: DC
  Administered 2021-12-08 – 2021-12-10 (×3): 1 via ORAL
  Filled 2021-12-08 (×3): qty 1

## 2021-12-08 MED ORDER — LORAZEPAM 2 MG/ML IJ SOLN: 0.0000 mg | Freq: Four times a day (QID) | INTRAMUSCULAR | Status: DC

## 2021-12-08 MED ORDER — METOPROLOL SUCCINATE ER 25 MG PO TB24
25.0000 mg | ORAL_TABLET | Freq: Every day | ORAL | Status: DC
  Administered 2021-12-09 – 2021-12-10 (×2): 25 mg via ORAL
  Filled 2021-12-08 (×3): qty 1

## 2021-12-08 MED ORDER — LACTATED RINGERS IV BOLUS
500.0000 mL | Freq: Once | INTRAVENOUS | Status: AC
  Administered 2021-12-08: 500 mL via INTRAVENOUS

## 2021-12-08 MED ORDER — GABAPENTIN 300 MG PO CAPS
300.0000 mg | ORAL_CAPSULE | Freq: Every day | ORAL | Status: DC
  Administered 2021-12-08 – 2021-12-09 (×2): 300 mg via ORAL
  Filled 2021-12-08 (×2): qty 1

## 2021-12-08 MED ORDER — ONDANSETRON HCL 4 MG PO TABS: 4.0000 mg | ORAL_TABLET | Freq: Four times a day (QID) | ORAL | Status: DC | PRN

## 2021-12-08 MED ORDER — LORAZEPAM 2 MG/ML IJ SOLN: 1.0000 mg | INTRAMUSCULAR | Status: DC | PRN

## 2021-12-08 MED ORDER — THIAMINE HCL 100 MG/ML IJ SOLN
100.0000 mg | Freq: Every day | INTRAMUSCULAR | Status: DC
  Filled 2021-12-08: qty 2

## 2021-12-08 MED ORDER — FOLIC ACID 1 MG PO TABS
1.0000 mg | ORAL_TABLET | Freq: Every day | ORAL | Status: DC
  Administered 2021-12-08 – 2021-12-10 (×3): 1 mg via ORAL
  Filled 2021-12-08 (×3): qty 1

## 2021-12-08 MED ORDER — FLUTICASONE FUROATE-VILANTEROL 100-25 MCG/ACT IN AEPB
1.0000 | INHALATION_SPRAY | Freq: Every day | RESPIRATORY_TRACT | Status: DC
  Administered 2021-12-08 – 2021-12-10 (×3): 1 via RESPIRATORY_TRACT
  Filled 2021-12-08: qty 28

## 2021-12-08 MED ORDER — ALBUTEROL SULFATE (2.5 MG/3ML) 0.083% IN NEBU: 2.5000 mg | INHALATION_SOLUTION | RESPIRATORY_TRACT | Status: DC | PRN

## 2021-12-08 MED ORDER — ONDANSETRON HCL 4 MG/2ML IJ SOLN: 4.0000 mg | Freq: Four times a day (QID) | INTRAMUSCULAR | Status: DC | PRN

## 2021-12-08 MED ORDER — HYDRALAZINE HCL 20 MG/ML IJ SOLN: 10.0000 mg | Freq: Four times a day (QID) | INTRAMUSCULAR | Status: DC | PRN

## 2021-12-08 MED ORDER — AMIODARONE HCL 200 MG PO TABS
200.0000 mg | ORAL_TABLET | Freq: Every day | ORAL | Status: DC
  Administered 2021-12-08 – 2021-12-10 (×3): 200 mg via ORAL
  Filled 2021-12-08 (×3): qty 1

## 2021-12-08 MED ORDER — LORAZEPAM 1 MG PO TABS: 1.0000 mg | ORAL_TABLET | ORAL | Status: DC | PRN

## 2021-12-08 MED ORDER — APIXABAN 5 MG PO TABS
5.0000 mg | ORAL_TABLET | Freq: Two times a day (BID) | ORAL | Status: DC
  Administered 2021-12-08 – 2021-12-09 (×3): 5 mg via ORAL
  Filled 2021-12-08 (×3): qty 1

## 2021-12-08 MED ORDER — POLYETHYLENE GLYCOL 3350 17 G PO PACK
17.0000 g | PACK | Freq: Every day | ORAL | Status: DC | PRN
  Administered 2021-12-09: 17 g via ORAL
  Filled 2021-12-08: qty 1

## 2021-12-08 MED ORDER — MAGNESIUM SULFATE 2 GM/50ML IV SOLN
2.0000 g | Freq: Once | INTRAVENOUS | Status: AC
  Administered 2021-12-08: 2 g via INTRAVENOUS
  Filled 2021-12-08: qty 50

## 2021-12-08 NOTE — Plan of Care (Signed)

## 2021-12-08 NOTE — Assessment & Plan Note (Signed)
   Currently rate controlled and in normal sinus rhythm Despite scalp laceration will resume home regimen of recently prescribed Eliquis tomorrow morning. Considering patient's relatively low CHA2DS2-VASc score of 2, ongoing alcohol abuse and high risk for devastating bleeding from falls, will need to discuss pros and cons of ongoing Eliquis use with the patient further Resuming home regimen of amiodarone and metoprolol as tolerated Monitoring on telemetry

## 2021-12-08 NOTE — Assessment & Plan Note (Signed)
   Slow resumption of home antihypertensives and diuretics  As needed intravenous hypertensives for markedly elevated blood pressures

## 2021-12-08 NOTE — H&P (Signed)
History and Physical    Patient: Luis Marquez MRN: [id number] DOA: 12/07/2021  Date of Service: the patient was seen and examined on 12/08/2021  Patient coming from: Home via EMS  Chief Complaint:  Chief Complaint  Patient presents with   Fall    HPI:   61 year old male with past medical history of alcohol abuse and delirium tremens in the past, hypertension, paroxysmal atrial fibrillation (on Apixiban), COPD with emphysema, nonobstructive coronary artery disease (Cath 11/11/2021) nonischemic cardiomyopathy (possible Takotsubo) with systolic congestive heart failure (Echo 10/2021 EF 20-25%) presenting to [facility] emergency department via EMS after experiencing an episode of loss of consciousness.  Of note, patient was recently hospitalized at [facility] from 4/18 until 5/2.  Patient initially presented with syncope and hypercalcemia.  Patient was found to have newly diagnosed systolic congestive heart failure with some concern for Takotsubo's cardiomyopathy.  Patient underwent cardiac catheterization which revealed nonobstructive coronary artery disease.  Patient was also found to have severe hypercalcemia initially 15.6.  Initial work-up failed to identify a cause with plan for PET scan as outpatient.  Hospital course was also complicated by atrial fibrillation with rapid ventricular response which was managed with intravenous and oral amiodarone.  Hospital course was also complicated by alcohol withdrawal managed with benzodiazepines.  Patient was eventually discharged on 5/2.  Patient explains that the last thing he remembers is getting out of his car in his driveway the evening of 5/23 when he suddenly lost consciousness, striking the back of his head on the pavement.  Patient was with a friend who did not witness the moment of impact but was standing nearby and heard the patient hit the ground.  Patient did not exhibit any seizure-like activity, tongue biting, self  urination or defecation.  Patient quickly regained consciousness but did not recall his symptoms preceding the fall.    Patient denies any recent feelings of vertigo, lightheadedness chest pain palpitations or weakness.  Patient does admit to ongoing drinking reporting that he has drank several beers the evening of 5/23 prior to this incident.  After falling and regaining consciousness, EMS was contacted who promptly came to evaluate the patient and brought the patient into [facility] emergency department for evaluation.  Upon evaluation in the emergency department patient was initially found to be hypotensive with blood pressure of 82/55.  Patient was administered 500 cc of lactated Ringer bolus.  Trauma survey was performed including CT imaging of the head and neck revealing no evidence of intracranial hemorrhage or cervical injury.  Patient was found to have multiple electrolyte derangements and therefore magnesium and potassium were administered.  Due to patient's recent diagnosis of advanced congestive heart failure the hospitalist group was called to assess the patient for admission to the hospital due to concerns for cardiogenic syncope.    Review of Systems: Review of Systems  Musculoskeletal:  Positive for falls.  Neurological:  Positive for loss of consciousness.  All other systems reviewed and are negative.   Past Medical History:  Diagnosis Date   Hypertension     Past Surgical History:  Procedure Laterality Date   HERNIA REPAIR     INTRAMEDULLARY (IM) NAIL INTERTROCHANTERIC Right 11/11/2014   Procedure: INTRAMEDULLARY (IM) NAIL INTERTROCHANTRIC;  Surgeon: Paralee Cancel, MD;  Location: [facility] ORS;  Service: Orthopedics;  Laterality: Right;   RIGHT/LEFT HEART CATH AND CORONARY ANGIOGRAPHY N/A 11/11/2021   Procedure: RIGHT/LEFT HEART CATH AND CORONARY ANGIOGRAPHY;  Surgeon: Jolaine Artist, MD;  Location: [facility]  INVASIVE CV LAB;  Service: Cardiovascular;  Laterality: N/A;   Rod  right leg      Social History:  reports that he has been smoking cigarettes. He has been smoking an average of .25 packs per day. He has never been exposed to tobacco smoke. He has never used smokeless tobacco. He reports that he does not currently use alcohol. He reports that he does not currently use drugs.  No Known Allergies  Family History  Problem Relation Age of Onset   Diabetes Mother     Prior to Admission medications   Medication Sig Start Date End Date Taking? Authorizing Provider  albuterol (VENTOLIN HFA) 108 (90 Base) MCG/ACT inhaler INHALE 2 PUFFS INTO THE LUNGS EVERY 6 (SIX) HOURS AS NEEDED FOR WHEEZING OR SHORTNESS OF BREATH (COUGH). Patient taking differently: Inhale 2 puffs into the lungs every 6 (six) hours as needed for wheezing or shortness of breath. 05/10/21  Yes Gildardo Pounds, NP  amiodarone (PACERONE) 200 MG tablet Take 1 tablet (200 mg total) by mouth daily. 11/16/21  Yes Lajean Manes, MD  apixaban (ELIQUIS) 5 MG TABS tablet Take 1 tablet (5 mg total) by mouth 2 (two) times daily. 11/12/21  Yes Masters, Katie, DO  fluticasone furoate-vilanterol (BREO ELLIPTA) 100-25 MCG/ACT AEPB Inhale 1 puff into the lungs daily. 11/29/21  Yes Charlott Rakes, MD  folic acid (FOLVITE) 1 MG tablet Take 1 tablet (1 mg total) by mouth daily. 11/13/21  Yes Masters, Katie, DO  furosemide (LASIX) 20 MG tablet Take 1 tablet (20 mg total) by mouth daily as needed for edema or fluid. For weight gain of 3 lbs in 24 hours or 5 lbs in a week 11/22/21  Yes Milford, Maricela Bo, FNP  gabapentin (NEURONTIN) 300 MG capsule Take 1 capsule (300 mg total) by mouth at bedtime. 11/26/21 02/24/22 Yes Gildardo Pounds, NP  GLUCOS-CHONDROIT-MSM-C-HYAL PO Take 1 tablet by mouth daily.   Yes [provider]  magnesium oxide (MAG-OX) 400 MG tablet Take 1 tablet (400 mg total) by mouth daily. 11/24/21  Yes Milford, Maricela Bo, FNP  metoprolol succinate (TOPROL-XL) 25 MG 24 hr tablet Take 25 mg by mouth daily.    Yes [provider]  Multiple Vitamin (MULTIVITAMIN WITH MINERALS) TABS tablet Take 1 tablet by mouth daily.   Yes [provider]  naltrexone (DEPADE) 50 MG tablet Take 1 tablet (50 mg total) by mouth daily. 11/12/21  Yes Masters, Katie, DO  naproxen sodium (ALEVE) 220 MG tablet Take 220 mg by mouth 2 (two) times daily as needed (moderate pain).   Yes [provider]  sacubitril-valsartan (ENTRESTO) 97-103 MG Take 1 tablet by mouth 2 (two) times daily. 12/01/21  Yes Bensimhon, Shaune Pascal, MD  spironolactone (ALDACTONE) 25 MG tablet Take 1 tablet (25 mg total) by mouth daily. 11/13/21  Yes Masters, Katie, DO  ferrous sulfate 325 (65 FE) MG tablet Take 1 tablet (325 mg total) by mouth 2 (two) times daily with a meal. Patient not taking: Reported on 12/07/2021 03/05/20 11/23/22  Gildardo Pounds, NP  umeclidinium bromide (INCRUSE ELLIPTA) 62.5 MCG/ACT AEPB Inhale 1 puff into the lungs daily. Patient not taking: Reported on 12/07/2021 11/17/21   Lajean Manes, MD    Physical Exam:  Vitals:   12/07/21 2245 12/08/21 0000 12/08/21 0022 12/08/21 0445  BP: 116/87 (!) 119/94 131/84 105/78  Pulse: 92 95 82 93  Resp: 17 (!) 22  19  Temp:  98.3 F (36.8 C) 98.5 F (36.9 C)  98.1 F (36.7 C)  TempSrc:   Oral Oral  SpO2: 98% 99% 97% 92%  Weight:   84.5 kg   Height:   6\' 1"  (1.854 m)     Constitutional: Awake alert and oriented x3, no associated distress.   Skin: Notable scalp laceration over the occiput, staples in place.  No other rashes or lesions are seen.  Slightly poor skin turgor noted. Eyes: Pupils are equally reactive to light.  No evidence of scleral icterus or conjunctival pallor.  ENMT: Slightly dry mucous membranes noted.  Posterior pharynx clear of any exudate or lesions.   Neck: normal, supple, no masses, no thyromegaly.  No evidence of jugular venous distension.   Respiratory: clear to auscultation bilaterally, no wheezing, no crackles. Normal respiratory effort. No  accessory muscle use.  Cardiovascular: Tachycardic rate with regular rhythm, no murmurs / rubs / gallops. No extremity edema. 2+ pedal pulses. No carotid bruits.  Chest:   Nontender without crepitus or deformity.   Back:   Nontender without crepitus or deformity. Abdomen: Abdomen is soft and nontender.  No evidence of intra-abdominal masses.  Positive bowel sounds noted in all quadrants.   Musculoskeletal: No joint deformity upper and lower extremities. Good ROM, no contractures. Normal muscle tone.  Neurologic: CN 2-12 grossly intact. Sensation intact.  Patient moving all 4 extremities spontaneously.  Patient is following all commands.  Patient is responsive to verbal stimuli.   Psychiatric: Patient exhibits normal mood with appropriate affect.  Patient seems to possess insight as to their current situation.    Data Reviewed:  I have personally reviewed and interpreted labs, imaging.  Significant findings are:  Chemistry revealed magnesium 1.8, Potassium 3.0, creatinine 1.64, calcium 11.9 and sodium 135.   CBC revealing white blood cell count of 8.3, hemoglobin 12.4, hematocrit 36 and platelet count of 256.   Urinalysis revealing cloudy appearance with trace leukocytes, 6-10 white blood cells per high-powered field and rare bacteria. Ethanol level 116. Chest x-ray personally reviewed revealing no evidence of acute cardiopulmonary disease.  EKG: Personally reviewed.  Rhythm is normal sinus rhythm with heart rate of 99 bpm.  Evidence of right bundle branch block.  No dynamic ST segment changes appreciated.   Assessment and Plan: * Syncope Patient presenting episode of loss of consciousness shortly after rising from a seated position and getting out of his vehicle Furthermore, patient was hypotensive on arrival with concurrent acute kidney injury (possibly prerenal) further suggesting that this syncope was orthostatic in origin Confounding presence of heart disease being about concern for  potential cardiogenic syncope, albeit less likely Patient has already received some hydration in the emergency department with improvement in hypotension We will continue gentle intravenous hydration, particularly considering concurrent acute kidney injury and hypercalcemia Patient is on a number of agents that can lower blood pressure including furosemide, spironolactone, Entresto and metoprolol.  We will start by resuming metoprolol and if this is tolerated day team can slowly add additional medications. Obtaining orthostatic vital signs  Monitoring patient on telemetry Noncontrast CT head unremarkable   AKI (acute kidney injury) ([place]) Patient exhibiting evidence of acute kidney injury, multifactorial secondary to volume depletion and hypercalcemia Creatinine is currently 1.64 an increase compared to baseline of 0.8 Hydrating patient with intravenous isotonic fluids while monitoring closely to ensure patient doesn't exhibit any signs of volume overload Strict input and output monitoring Monitoring renal function and electrolytes with serial chemistries Avoiding nephrotoxic agents if at all possible   Hypercalcemia Persisting hypercalcemia, despite aggressive hydration  and administration of zoledronic acid during recent hospitalization Degree of hypercalcemia, while not as severe as the previous hospitalization, is highly suggestive of undiagnosed malignancy. PTH/PTH related peptide already performed on 4/18. No need for additional bisphosphonate dosing at this point Gentle intravenous hydration for now and will trend calcium levels with serial chemistries Patient was to undergo outpatient PET scan after recent hospitalization.  Once renal function improved this patient would likely best be served by a contrast CT scan of the abdomen and pelvis which can be done during this hospitalization and promptly identify any obvious malignancies  Chronic systolic CHF (congestive heart failure)  (HCC) Currently somewhat volume depleted, no evidence of cardiogenic volume over the Holding recently prescribed regimen of diuretics at this time due to acute kidney injury and volume depletion Additionally holding Entresto temporarily which can be slowly readded as blood pressure tolerates Strict input and output monitoring Daily weights  Paroxysmal atrial fibrillation (HCC) Currently rate controlled and in normal sinus rhythm Despite scalp laceration will resume home regimen of recently prescribed Eliquis tomorrow morning. Considering patient's relatively low CHA2DS2-VASc score of 2, ongoing alcohol abuse and high risk for devastating bleeding from falls, will need to discuss pros and cons of ongoing Eliquis use with the patient further Resuming home regimen of amiodarone and metoprolol as tolerated Monitoring on telemetry   Hypokalemia Replacing with potassium chloride Evaluating for concurrent hypomagnesemia  Monitoring potassium levels with serial chemistries.   Alcohol abuse Ongoing alcohol abuse with patient admitting to drinking several beers this past evening and presenting with an elevated ethanol level on initial work-up Patient developed alcohol withdrawal and early delirium tremens during the last hospitalization and therefore is at high risk of withdrawal and delirium tremens once again Initiating CIWA protocol with tapering benzodiazepine regimen We will provide additional doses of benzodiazepines for evidence of withdrawal   Essential hypertension Slow resumption of home antihypertensives and diuretics As needed intravenous hypertensives for markedly elevated blood pressures  Emphysema lung (HCC) No evidence of COPD exacerbation this time Patient has never undergone PFT's but COPD diagnosis is presumed based on emphysema on previous imaging Patient was sent home with a regimen of Breo after the last hospitalization which will be continued As needed bronchodilator  therapy for episodic shortness of breath and wheezing.        Code Status:  Full code  code status decision has been confirmed with: patient Family Communication: deferred   Consults: None  Severity of Illness:  The appropriate patient status for this patient is OBSERVATION. Observation status is judged to be reasonable and necessary in order to provide the required intensity of service to ensure the patient's safety. The patient's presenting symptoms, physical exam findings, and initial radiographic and laboratory data in the context of their medical condition is felt to place them at decreased risk for further clinical deterioration. Furthermore, it is anticipated that the patient will be medically stable for discharge from the hospital within 2 midnights of admission.   Author:  Vernelle Emerald MD  12/08/2021 6:18 AM

## 2021-12-08 NOTE — Progress Notes (Signed)
CSW received consult for substance use resources for patient. CSW met with patient at bedside. Patient reports he is from [facility]. Patient reports his plan is to return back to [place] when medically ready for dc. Patient reports his friend Reita Cliche will provide transportation for patient at dc. CSW offered patient outpatient substance use treatment services resources. Patient accepted. All questions answered. No further questions reported at this time.

## 2021-12-08 NOTE — Assessment & Plan Note (Addendum)
   No evidence of COPD exacerbation this time  Patient has never undergone PFT's but COPD diagnosis is presumed based on emphysema on previous imaging  Patient was sent home with a regimen of Breo after the last hospitalization which will be continued  As needed bronchodilator therapy for episodic shortness of breath and wheezing.

## 2021-12-08 NOTE — Assessment & Plan Note (Addendum)
   Persisting hypercalcemia, despite aggressive hydration and administration of zoledronic acid during recent hospitalization  Degree of hypercalcemia, while not as severe as the previous hospitalization, is highly suggestive of undiagnosed malignancy.  PTH/PTH related peptide already performed on 4/18.  No need for additional bisphosphonate dosing at this point  Gentle intravenous hydration for now and will trend calcium levels with serial chemistries  Patient was to undergo outpatient PET scan after recent hospitalization.  Once renal function improved this patient would likely best be served by a contrast CT scan of the abdomen and pelvis which can be done during this hospitalization and promptly identify any obvious malignancies

## 2021-12-08 NOTE — Progress Notes (Cosign Needed Addendum)
Subjective:   Transfer History:   Per chart review, patient was admitted on 4/18 to 5/2 for new onset HFrEF with a EF of 20 to 25% likely secondary to Takotsubo cardiomyopathy.  Hospital stay was complicated by severe hypercalcemia consistent with known parathyroid hypercalcemia, for which she was treated with calcitonin and bisphosphonate.  Additional complications included atrial fibrillation with RVR treated with amiodarone and Eliquis, delirium tremens 2/2 AUD treated with Ativan and Librium.  Yesterday, patient presented to the ED with complaints of syncope.  He states he was in the process of getting out of his vehicle when he lost consciousness.  He has complete amnesia involving the event and does not recall any prodromal symptoms.  He did hit the back of his head which required sutures.  A friend that was nearby recalls hearing him fall but denies seeing any abnormal movement or any altered mental status once patient awoke.  Initial work-up demonstrates an AKI and hypokalemia.  CBC, CT head, CT cervical spine, and chest x-ray all negative for acute findings.  Patient was admitted for observation and telemetry monitoring.  IMG has resumed care on 12/08/2021.  Interval History:  This AM, Mr. Wickland states he feels great.  He is experiencing some right leg pain that he notes is chronic after he had a rod placed there.  He also notes that his left knee hurts somewhat after he fell a few days ago.  He denies any chest pain, palpitations, trouble breathing or lower extremity swelling.  He notes he was taking all his medications as prescribed prior to admission.  Mr. Damboise adds that he began to experience severe heartburn approximately 6 weeks ago for which he is requiring multiple tabs of Tums per day.  This is new for him and he has never experienced heartburn in the past.  Objective:  Vital signs in last 24 hours: Vitals:   12/08/21 0445 12/08/21 0729 12/08/21 0814 12/08/21 0929  BP:  105/78  112/63 92/62  Pulse: 93  98 93  Resp: 19  20   Temp: 98.1 F (36.7 C)  98.4 F (36.9 C)   TempSrc: Oral  Oral   SpO2: 92% 95% 94% 93%  Weight:      Height:       Physical Exam Vitals and nursing note reviewed.  Constitutional:      General: He is not in acute distress.    Appearance: He is normal weight. He is not toxic-appearing.  HENT:     Head: Normocephalic and atraumatic.     Mouth/Throat:     Mouth: Mucous membranes are dry.     Pharynx: Oropharynx is clear. No oropharyngeal exudate or posterior oropharyngeal erythema.     Comments: No oropharyngeal masses noted. Eyes:     General: No scleral icterus.    Extraocular Movements: Extraocular movements intact.     Pupils: Pupils are equal, round, and reactive to light.  Neck:     Vascular: No JVD.  Cardiovascular:     Rate and Rhythm: Normal rate and regular rhythm.     Heart sounds: No murmur heard. Pulmonary:     Effort: Pulmonary effort is normal. No respiratory distress.     Breath sounds: No stridor. Rales (Minimal rales in the right lower lung field) present. No wheezing or rhonchi.  Abdominal:     General: Bowel sounds are normal. There is no distension.     Palpations: Abdomen is soft.     Tenderness: There is no  abdominal tenderness. There is no guarding.  Musculoskeletal:     Right knee: Normal.     Left knee: Effusion present. No bony tenderness or crepitus. No tenderness.     Right lower leg: No edema.     Left lower leg: No edema.  Neurological:     General: No focal deficit present.     Mental Status: He is alert and oriented to person, place, and time.     Motor: No weakness or tremor.  Psychiatric:        Mood and Affect: Mood normal.        Behavior: Behavior normal.        Thought Content: Thought content normal.        Judgment: Judgment normal.   Assessment/Plan:  Principal Problem:   Syncope Active Problems:   Hypercalcemia   Hypokalemia   Emphysema lung (HCC)   Essential  hypertension   AKI (acute kidney injury) (HCC)   Chronic systolic CHF (congestive heart failure) (HCC)   Alcohol abuse   Paroxysmal atrial fibrillation [facility])  Mr. Albach is a 61 year old gentleman with a past medical history of HFrEF secondary to Takotsubo cardiomyopathy, paroxysmal A-fib on Eliquis, alcohol use disorder on naltrexone who is currently admitted for syncope.  # Syncope  One-time episode of syncope on 5/23 that occurred when patient was attempting to get out of his car; no prodromal symptoms and complete amnesia surrounding the event.  No postictal state noted nor abnormal muscle movements while unconscious; no tongue biting or urinary incontinence noted.  Differential initially included orthostatic versus cardiogenic.  Given development of AKI with alcohol use prior to event, orthostatic hypotension more likely.  Orthostatics today were positive even after receiving approximately 1.5 L of IV fluids.  Telemetry personally reviewed and reassuring; patient is in sinus rhythm with rates between 80-90 without any evidence of atrial fibrillation or NSVT.  We will work on continued rehydration today with IV fluids and continue holding home Entresto and spironolactone.  - 500 cc LR bolus followed by 100 cc/hr for 12 hours  - Continue telemetry monitoring - Continue holding Entresto and spironolactone  # Acute Kidney Injury  Creatinine on admission elevated at 1.6 with improvement to 1.3 after receiving IV fluids.    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  We are currently holding Entresto and spironolactone due to syncope and orthostatic hypotension.  - Continue metoprolol - Continue holding Entresto and spironolactone  # Paroxysmal Atrial Fibrillation  Sinus rhythm at this time.  - Continue home metoprolol and apixaban  # Non-Parathyroid Hypercalcemia  During most recent hospitalization, patient's elevated  up to 14.5.  PTH evaluated and suppressed.  PTHrP negative. Thyroid and MM ruled out.  PCA negative.  CT chest notable for small nodular densities between 3-6 mm with recommendation to repeat CT chest in 3-6 months. Last colonoscopy approximately 10-11 years ago.   Given high suspicion for underlying malignancy, further evaluation will be needed. Given new onset dyspepsia, would benefit from endoscopy. Additionally, would consider CT abdomen/pelvis W contrast. Patient is unfunded and outpatient PET scan will be difficult to arrange as previously planned.   - IV fluid resuscitation today as noted above - If calcium remains elevated, consider additional dose of bisphosphonates - Consider CT abdomen/pelvis with contrast once additional renal recovery is achieved  # Alcohol use Disorder  Ethanol level elevated on admission.  Patient is at risk for withdrawal given most  recent admission with DTs.  Although, patient states he did not drink for the past 5 days prior to admission.  At this time, there is no evidence of withdrawal on examination.  We will continue CIWA monitoring without Ativan at this time.  - CIWA every 6 hours -Thiamine, folic acid and multivitamin daily  # COPD - Continue home Breo and albuterol as needed  # Asymptomatic Pyuria  No symptoms of dysuria or difficulty with urination.  No indication for antibiotic management.  # Left knee Effusion  Likely traumatic due to recent fall.  Patient is not endorsing any pain at this time.  Examination is reassuring with no erythema, edema or limited range of motion.  We will treat symptomatically and monitor daily  Dispo: Anticipated discharge in approximately 1-2 day(s).   Dr. Jose Persia Internal Medicine PGY-3  Pager: [phone] 12/08/2021, 12:03 PM

## 2021-12-08 NOTE — Assessment & Plan Note (Signed)
   Ongoing alcohol abuse with patient admitting to drinking several beers this past evening and presenting with an elevated ethanol level on initial work-up  Patient developed alcohol withdrawal and early delirium tremens during the last hospitalization and therefore is at high risk of withdrawal and delirium tremens once again  Initiating CIWA protocol with tapering benzodiazepine regimen  We will provide additional doses of benzodiazepines for evidence of withdrawal

## 2021-12-08 NOTE — Assessment & Plan Note (Addendum)
   Patient presenting episode of loss of consciousness shortly after rising from a seated position and getting out of his vehicle  Furthermore, patient was hypotensive on arrival with concurrent acute kidney injury (possibly prerenal) further suggesting that this syncope was orthostatic in origin  Confounding presence of heart disease being about concern for potential cardiogenic syncope, albeit less likely  Patient has already received some hydration in the emergency department with improvement in hypotension  We will continue gentle intravenous hydration, particularly considering concurrent acute kidney injury and hypercalcemia  Patient is on a number of agents that can lower blood pressure including furosemide, spironolactone, Entresto and metoprolol.  We will start by resuming metoprolol and if this is tolerated day team can slowly add additional medications.  Obtaining orthostatic vital signs   Monitoring patient on telemetry  Noncontrast CT head unremarkable

## 2021-12-08 NOTE — Progress Notes (Signed)
Mobility Specialist Progress Note    12/08/21 1611  Mobility  Activity Ambulated with assistance in hallway  Level of Assistance Contact guard assist, steadying assist  Assistive Device Front wheel walker  Distance Ambulated (ft) 330 ft  Activity Response Tolerated well  $Mobility charge 1 Mobility   Pre-Mobility: 82 HR, 125/82 BP, 96% SpO2 Post-Mobility: 80 HR  Pt received in bed and agreeable. C/o leg pain. Returned to chair with call bell in reach.    Hildred Alamin Mobility Specialist  Primary: 5N M.S. Phone: [phone] Secondary: 6N M.S. Phone: [phone]

## 2021-12-08 NOTE — Assessment & Plan Note (Signed)
·   Replacing with potassium chloride °· Evaluating for concurrent hypomagnesemia  °· Monitoring potassium levels with serial chemistries. ° °

## 2021-12-08 NOTE — Assessment & Plan Note (Signed)
   Currently somewhat volume depleted, no evidence of cardiogenic volume over the  Holding recently prescribed regimen of diuretics at this time due to acute kidney injury and volume depletion  Additionally holding Entresto temporarily which can be slowly readded as blood pressure tolerates  Strict input and output monitoring  Daily weights

## 2021-12-08 NOTE — Assessment & Plan Note (Addendum)
.   Patient exhibiting evidence of acute kidney injury, multifactorial secondary to volume depletion and hypercalcemia . Creatinine is currently 1.64 an increase compared to baseline of 0.8 . Hydrating patient with intravenous isotonic fluids while monitoring closely to ensure patient doesn't exhibit any signs of volume overload . Strict input and output monitoring . Monitoring renal function and electrolytes with serial chemistries . Avoiding nephrotoxic agents if at all possible

## 2021-12-09 ENCOUNTER — Observation Stay (HOSPITAL_COMMUNITY): Payer: Medicaid Other

## 2021-12-09 DIAGNOSIS — I2510 Atherosclerotic heart disease of native coronary artery without angina pectoris: Secondary | ICD-10-CM | POA: Diagnosis not present

## 2021-12-09 DIAGNOSIS — R933 Abnormal findings on diagnostic imaging of other parts of digestive tract: Secondary | ICD-10-CM | POA: Diagnosis not present

## 2021-12-09 DIAGNOSIS — K2100 Gastro-esophageal reflux disease with esophagitis, without bleeding: Secondary | ICD-10-CM

## 2021-12-09 DIAGNOSIS — K449 Diaphragmatic hernia without obstruction or gangrene: Secondary | ICD-10-CM | POA: Diagnosis not present

## 2021-12-09 DIAGNOSIS — I5083 High output heart failure: Secondary | ICD-10-CM | POA: Diagnosis not present

## 2021-12-09 DIAGNOSIS — F1010 Alcohol abuse, uncomplicated: Secondary | ICD-10-CM

## 2021-12-09 DIAGNOSIS — I428 Other cardiomyopathies: Secondary | ICD-10-CM | POA: Diagnosis not present

## 2021-12-09 DIAGNOSIS — F17210 Nicotine dependence, cigarettes, uncomplicated: Secondary | ICD-10-CM | POA: Diagnosis not present

## 2021-12-09 DIAGNOSIS — Z7901 Long term (current) use of anticoagulants: Secondary | ICD-10-CM | POA: Diagnosis not present

## 2021-12-09 DIAGNOSIS — Z7951 Long term (current) use of inhaled steroids: Secondary | ICD-10-CM | POA: Diagnosis not present

## 2021-12-09 DIAGNOSIS — E869 Volume depletion, unspecified: Secondary | ICD-10-CM | POA: Diagnosis not present

## 2021-12-09 DIAGNOSIS — N179 Acute kidney failure, unspecified: Secondary | ICD-10-CM | POA: Diagnosis not present

## 2021-12-09 DIAGNOSIS — I5022 Chronic systolic (congestive) heart failure: Secondary | ICD-10-CM | POA: Diagnosis not present

## 2021-12-09 DIAGNOSIS — I480 Paroxysmal atrial fibrillation: Secondary | ICD-10-CM | POA: Diagnosis not present

## 2021-12-09 DIAGNOSIS — J439 Emphysema, unspecified: Secondary | ICD-10-CM | POA: Diagnosis not present

## 2021-12-09 DIAGNOSIS — W19XXXA Unspecified fall, initial encounter: Secondary | ICD-10-CM | POA: Diagnosis present

## 2021-12-09 DIAGNOSIS — K2970 Gastritis, unspecified, without bleeding: Secondary | ICD-10-CM | POA: Diagnosis not present

## 2021-12-09 DIAGNOSIS — Z79899 Other long term (current) drug therapy: Secondary | ICD-10-CM | POA: Diagnosis not present

## 2021-12-09 DIAGNOSIS — R55 Syncope and collapse: Secondary | ICD-10-CM

## 2021-12-09 DIAGNOSIS — R1310 Dysphagia, unspecified: Secondary | ICD-10-CM | POA: Diagnosis not present

## 2021-12-09 DIAGNOSIS — I951 Orthostatic hypotension: Secondary | ICD-10-CM | POA: Diagnosis not present

## 2021-12-09 DIAGNOSIS — R8281 Pyuria: Secondary | ICD-10-CM | POA: Diagnosis not present

## 2021-12-09 DIAGNOSIS — E876 Hypokalemia: Secondary | ICD-10-CM | POA: Diagnosis not present

## 2021-12-09 DIAGNOSIS — K3189 Other diseases of stomach and duodenum: Secondary | ICD-10-CM | POA: Diagnosis not present

## 2021-12-09 DIAGNOSIS — I110 Hypertensive heart disease with heart failure: Secondary | ICD-10-CM | POA: Diagnosis not present

## 2021-12-09 DIAGNOSIS — M25462 Effusion, left knee: Secondary | ICD-10-CM | POA: Diagnosis not present

## 2021-12-09 DIAGNOSIS — S0101XA Laceration without foreign body of scalp, initial encounter: Secondary | ICD-10-CM | POA: Diagnosis not present

## 2021-12-09 DIAGNOSIS — K209 Esophagitis, unspecified without bleeding: Secondary | ICD-10-CM | POA: Diagnosis not present

## 2021-12-09 DIAGNOSIS — K2091 Esophagitis, unspecified with bleeding: Secondary | ICD-10-CM | POA: Diagnosis not present

## 2021-12-09 DIAGNOSIS — R1319 Other dysphagia: Secondary | ICD-10-CM | POA: Diagnosis not present

## 2021-12-09 LAB — IRON AND TIBC
Iron: 33 ug/dL — ABNORMAL LOW (ref 45–182)
Saturation Ratios: 14 % — ABNORMAL LOW (ref 17.9–39.5)
TIBC: 232 ug/dL — ABNORMAL LOW (ref 250–450)
UIBC: 199 ug/dL

## 2021-12-09 LAB — CBC WITH DIFFERENTIAL/PLATELET
Abs Immature Granulocytes: 0.02 10*3/uL (ref 0.00–0.07)
Basophils Absolute: 0 10*3/uL (ref 0.0–0.1)
Basophils Relative: 1 %
Eosinophils Absolute: 0.3 10*3/uL (ref 0.0–0.5)
Eosinophils Relative: 5 %
HCT: 29.8 % — ABNORMAL LOW (ref 39.0–52.0)
Hemoglobin: 10.2 g/dL — ABNORMAL LOW (ref 13.0–17.0)
Immature Granulocytes: 0 %
Lymphocytes Relative: 23 %
Lymphs Abs: 1.2 10*3/uL (ref 0.7–4.0)
MCH: 34.2 pg — ABNORMAL HIGH (ref 26.0–34.0)
MCHC: 34.2 g/dL (ref 30.0–36.0)
MCV: 100 fL (ref 80.0–100.0)
Monocytes Absolute: 0.4 10*3/uL (ref 0.1–1.0)
Monocytes Relative: 9 %
Neutro Abs: 3.1 10*3/uL (ref 1.7–7.7)
Neutrophils Relative %: 62 %
Platelets: 180 10*3/uL (ref 150–400)
RBC: 2.98 MIL/uL — ABNORMAL LOW (ref 4.22–5.81)
RDW: 14 % (ref 11.5–15.5)
WBC: 5 10*3/uL (ref 4.0–10.5)
nRBC: 0 % (ref 0.0–0.2)

## 2021-12-09 LAB — COMPREHENSIVE METABOLIC PANEL
ALT: 13 U/L (ref 0–44)
AST: 23 U/L (ref 15–41)
Albumin: 2.2 g/dL — ABNORMAL LOW (ref 3.5–5.0)
Alkaline Phosphatase: 56 U/L (ref 38–126)
Anion gap: 5 (ref 5–15)
BUN: 13 mg/dL (ref 8–23)
CO2: 31 mmol/L (ref 22–32)
Calcium: 10.3 mg/dL (ref 8.9–10.3)
Chloride: 103 mmol/L (ref 98–111)
Creatinine, Ser: 1.32 mg/dL — ABNORMAL HIGH (ref 0.61–1.24)
GFR, Estimated: >60 mL/min (ref >60)
Glucose, Bld: 104 mg/dL — ABNORMAL HIGH (ref 70–99)
Potassium: 4.3 mmol/L (ref 3.5–5.1)
Sodium: 139 mmol/L (ref 135–145)
Total Bilirubin: 0.8 mg/dL (ref 0.3–1.2)
Total Protein: 4.8 g/dL — ABNORMAL LOW (ref 6.5–8.1)

## 2021-12-09 LAB — MAGNESIUM: Magnesium: 1.9 mg/dL (ref 1.7–2.4)

## 2021-12-09 LAB — VITAMIN B12: Vitamin B-12: 193 pg/mL (ref 180–914)

## 2021-12-09 LAB — FOLATE: Folate: 25.8 ng/mL (ref >5.9)

## 2021-12-09 LAB — CALCIUM, IONIZED: Calcium, Ionized, Serum: 7.1 mg/dL — ABNORMAL HIGH (ref 4.5–5.6)

## 2021-12-09 LAB — FERRITIN: Ferritin: 372 ng/mL — ABNORMAL HIGH (ref 24–336)

## 2021-12-09 MED ORDER — IOHEXOL 9 MG/ML PO SOLN
ORAL | Status: AC
  Administered 2021-12-09: 500 mL
  Filled 2021-12-09: qty 1000

## 2021-12-09 MED ORDER — IOHEXOL 300 MG/ML  SOLN
100.0000 mL | Freq: Once | INTRAMUSCULAR | Status: AC | PRN
  Administered 2021-12-09: 100 mL via INTRAVENOUS

## 2021-12-09 MED ORDER — MAGNESIUM SULFATE 2 GM/50ML IV SOLN
2.0000 g | Freq: Once | INTRAVENOUS | Status: AC
  Administered 2021-12-09: 2 g via INTRAVENOUS
  Filled 2021-12-09: qty 50

## 2021-12-09 MED ORDER — SODIUM CHLORIDE 0.9 % IV SOLN: INTRAVENOUS | Status: DC

## 2021-12-09 NOTE — Telephone Encounter (Signed)
Advanced Heart Failure Patient Advocate Encounter   Patient was approved to receive Entresto from [employer].  Effective dates: 12/09/21 through 12/10/22  Document scanned to chart.   Charlann Boxer, CPhT

## 2021-12-09 NOTE — Plan of Care (Signed)

## 2021-12-09 NOTE — H&P (View-Only) (Signed)
Consultation Note   Referring Provider: Teaching Service PCP: Gildardo Pounds, NP Primary Gastroenterologist: Althia Forts  Reason for consultation: Abnormal esophagus on imaging  Hospital Day: 3   Attending physician's note  I have taken a history, reviewed the chart and examined the patient. I performed a substantive portion of this encounter, including complete performance of at least one of the key components, in conjunction with the APP. I agree with the APP's note, impression and recommendations.    61 year old very pleasant gentleman with history of chronic alcohol use, acute systolic heart failure, A-fib on Eliquis was noted to have incidental esophageal thickening on CT abdomen pelvis that was done to evaluate for hypercalcemia and unintentional weight loss  He reports intermittent dysphagia with both solids and liquids.  He also has on and off heartburn and dyspepsia symptoms Never had EGD  We will plan to proceed with EGD tomorrow a.m. for further evaluation of esophageal thickening, obtain biopsies.   N.p.o. after midnight The risks and benefits as well as alternatives of endoscopic procedure(s) have been discussed and reviewed. All questions answered. The patient agrees to proceed.  The patient was provided an opportunity to ask questions and all were answered. The patient agreed with the plan and demonstrated an understanding of the instructions.  [name] , MD [phone]     Assessment   61 yo male admitted 11/02/21 to 11/16/21 Etoh withdrawal, acute systolic heart failure, electrolyte derangements including markedly elevated serum Ca+.   Syncopal episode, reason for readmission.  Felt to be secondary to hypotension related to BP meds.   Hypercalcemia, improved. Prior evaluation negative including multiple myeloma or hyperparathyroidism. There was concern for underlying malignancy and outpatient PET was  recommended.  Ca+ 10.3 this admission, it was 14 last month  History of Etoh abuse. No evidence of cirrhosis on CT scan Withdrawals during recent admission. Still drinking, last drink was on Tuesday  Afib with RVR, on Eliquis at home.   One month history of dysphagia to solids. / liquids, heartburn. Mild esophageal wall thickening on CT scan ( done to evaluate for a malignancy as cause for elevated Ca+).   Esophageal findings may represent esophagitis, hiatal hernia or possible malignancy. His weight is down from 208 pounds a month ago to 186 pounds. Weight loss probably multifactorial ( ? Diuretics, recent prolonged admission, diminished appetite)  Macrocytic anemia / History of B12 deficiency in Aug 2022 ( B12 201). Developed during recent admission. IV fluid probably causing some dilution. Hgb 10.7.   See PMH for additional medical problems   Plan   He needs an EGD. The risks and benefits of EGD with possible biopsies were discussed with the patient who agrees to proceed. Eliquis has been held, his last dose was this am. Will see if Dr. Silverio Decamp is comfortable proceeding with EGD tomorrow as we generally hold it 48 hours for procedures.   Will repeat B12 level. Check folate and iron studies. Iron levels may be high from Etoh Outpatient screening colonoscopy at some point  HPI   Syncere Eble is a 61 y.o. male with a past medical history significant for  HTN, Etoh abuse, Afib with RVR on Eliquis, non-obstructive CAD , systolic heart failure ( Echo April  2023 EF 20-25%), and COPD.  See PMH for any additional medical problems.  Patient was hospitalized earlier this month with severe hypercalcemia, Etoh withdrawal, AKI, and acute heart failure. There was concern for Takotsubo initially.  Right and left heart cath was completed that showed high output heart failure with normal filling pressures. Cardiac MRI was completed and showed EF improved on 4/28 MRI from 25% to 42% . 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  He had multiple electrolyte abnormalities including a Ca+ of 15. Extensive evaluation was done and unrevealing. A CT scan in 2020 showed pulmonary nodules. Annual screening was recommended but patient didn't have it. Upon discharge it was recommended he get a PET to evaluate for malignancy as cause for hypercalcemia.  Discharged home on 5/2.   Patient came back to ED two days ago. Brought by EMS, he had fallen out of his car. Apparently had a syncopal episode. Sustained head laceration. On arrival his SBP was in 80's. Head / spine imaging without acute findings. It is thought that the syncopal episode was from hypotension related to BP meds.Ca+ improved but still elevated.  CT scan obtained to evaluate for a malignancy >> 3.3 cm AAA, mild circumferential distal esophageal thickening seen.   Over the last two months Jameon has been having heartburn and also dysphagia to both solids and liquids. He has been treating the heartburn with Tums. No vomiting. His weight is down over the last month but he was hospitalized, has been taking diuretics. No [place] of esophageal / stomach / colon cancer. He had a colonoscopy in [place] about 8 years ago, doesn't think he had polyps   Previous GI Evaluation      Recent Labs and Imaging CT Head Wo Contrast  Result Date: 12/07/2021 CLINICAL DATA:  Status post fall. EXAM: CT HEAD WITHOUT CONTRAST TECHNIQUE: Contiguous axial images were obtained from the base of the skull through the vertex without intravenous contrast. RADIATION DOSE REDUCTION: This exam was performed according to the departmental dose-optimization program which includes automated exposure control, adjustment of the mA and/or kV according to patient size and/or use of iterative reconstruction technique. COMPARISON:  November 02, 2021 FINDINGS: Brain: There is mild cerebral atrophy with widening of the extra-axial spaces and ventricular dilatation. There  are areas of decreased attenuation within the white matter tracts of the supratentorial brain, consistent with microvascular disease changes. Vascular: No hyperdense vessel or unexpected calcification. Skull: Normal. Negative for fracture or focal lesion. Sinuses/Orbits: No acute finding. Other: None. IMPRESSION: 1. No acute intracranial abnormality. 2. Generalized cerebral atrophy with chronic white matter small vessel ischemic changes. Electronically Signed   By: Virgina Norfolk M.D.   On: 12/07/2021 22:22   CT Cervical Spine Wo Contrast  Result Date: 12/07/2021 CLINICAL DATA:  Status post fall. EXAM: CT CERVICAL SPINE WITHOUT CONTRAST TECHNIQUE: Multidetector CT imaging of the cervical spine was performed without intravenous contrast. Multiplanar CT image reconstructions were also generated. RADIATION DOSE REDUCTION: This exam was performed according to the departmental dose-optimization program which includes automated exposure control, adjustment of the mA and/or kV according to patient size and/or use of iterative reconstruction technique. COMPARISON:  None Available. FINDINGS: Alignment: There is reversal of the normal cervical spine lordosis with 3.5 mm anterolisthesis of the C2 vertebral body on C3. Approximately 3 mm retrolisthesis of C4 is noted on C5. Skull base and vertebrae: No acute fracture. No primary bone lesion or focal pathologic process. Soft tissues and spinal canal: No prevertebral  fluid or swelling. No visible canal hematoma. Disc levels: There is marked severity endplate sclerosis and anterior osteophyte formation at the levels of C3-C4, C4-C5, C5-C6 and C6-C7. Marked severity intervertebral disc space narrowing is also seen at these levels. Bilateral marked severity multilevel facet joint hypertrophy is noted. Upper chest: Negative. Other: None. IMPRESSION: 1. Marked severity multilevel degenerative changes without evidence of an acute fracture. Electronically Signed   By: Virgina Norfolk M.D.   On: 12/07/2021 22:25   CT ABDOMEN PELVIS W CONTRAST  Result Date: 12/09/2021 A CLINICAL DATA: 61 year old male presents for evaluation of potential metastatic disease. EXAM: CT ABDOMEN AND PELVIS WITH CONTRAST TECHNIQUE: Multidetector CT imaging of the abdomen and pelvis was performed using the standard protocol following bolus administration of intravenous contrast. RADIATION DOSE REDUCTION: This exam was performed according to the departmental dose-optimization program which includes automated exposure control, adjustment of the mA and/or kV according to patient size and/or use of iterative reconstruction technique. CONTRAST:  128m OMNIPAQUE IOHEXOL 300 MG/ML  SOLN COMPARISON:  CT of the chest from November 02, 2021. FINDINGS: Lower chest: Lung bases are clear. No effusion or consolidative changes. Hepatobiliary: No focal, suspicious hepatic lesion. No pericholecystic stranding. No biliary duct dilation. Portal vein is patent. Pancreas: Normal contour.  No signs of pancreatic inflammation. Spleen: Normal size and contour. Adrenals/Urinary Tract: Adrenal glands are unremarkable. Symmetric renal enhancement. No sign of hydronephrosis. No suspicious renal lesion or perinephric stranding. Urinary bladder is grossly unremarkable. Small low-density lesion arising from the inferior RIGHT kidney compatible with a small cyst measuring 18 mm. No follow-up recommended for this finding. Other small low-density lesions too small for definitive characterization in the bilateral kidneys, statistically cysts. No dedicated follow-up recommended for these findings. Based on current guidelines. Stomach/Bowel: Mild circumferential distal esophageal thickening. No signs of stranding adjacent to the stomach. No signs of small bowel dilation or inflammation. Appendix is normal. No signs of colonic thickening or pericolonic stranding. Sigmoid diverticular changes without adjacent inflammation. Vascular/Lymphatic: 3.3 x  3.2 cm infrarenal abdominal aortic aneurysm of the infrarenal abdominal aorta. Eccentric soft plaque and moderate to marked calcified atherosclerosis of the abdominal aorta. No adenopathy in the abdomen. No adenopathy in the pelvis. Reproductive: Unremarkable by CT. Other: LEFT inguinal herniorrhaphy.  No free air or ascites. Musculoskeletal: No acute bone finding. No destructive bone process. Spinal degenerative changes. Degenerative changes about the RIGHT hip post RIGHT femoral ORIF of the proximal RIGHT femur. IMPRESSION: 1. No acute findings in the abdomen or pelvis. 2. Mild circumferential distal esophageal thickening. Correlate with any signs of esophagitis with dedicated esophageal evaluation as warranted. 3. 3.3 cm infrarenal abdominal aortic aneurysm. Recommend follow-up every 3 years. Reference: J Am Coll Radiol 27619;50:932-671 4. Sigmoid diverticular changes without evidence of acute diverticulitis. Aortic Atherosclerosis (ICD10-I70.0). Electronically Signed   By: GZetta BillsM.D.   On: 12/09/2021 12:44   CARDIAC CATHETERIZATION  Result Date: 11/11/2021   Prox Cx lesion is 20% stenosed.   Mid LAD lesion is 30% stenosed.   The left ventricular ejection fraction is 25-35% by visual estimate. Findings: Ao = 119/68 (90) LV = 119/17 RA =  3 RV = 27/6 PA = 25/9 (15) PCW = 10 Fick cardiac output/index = 8.2/3.7 PVR = 0.6 WU Ao sat = 94% PA sat = 72%, 73% SVC sat = 74% Assessment: 1. Mild non-obstructive CAD 2. NICM - EF hard to estimate from v-gram but seems to be in the 30-35% range 3. Normal filling pressures with  high cardiac output 4. No evidence of intracardiac shunting Plan/Discussion: Medical therapy. Consider repeat limited echo prior to d/c to reassess for EF improvement. Glori Bickers, MD 1:31 PM  DG Chest Portable 1 View  Result Date: 12/07/2021 CLINICAL DATA:  Fall, chest injury EXAM: PORTABLE CHEST 1 VIEW COMPARISON:  01/07/2021 FINDINGS: The lungs appear hyperinflated in keeping  with changes of underlying COPD. The lungs are clear. No pneumothorax or pleural effusion. Cardiac size within normal limits. Pulmonary vascularity is normal. No acute bone abnormality. Healed rib fractures are seen bilaterally. IMPRESSION: No radiographic evidence of acute cardiopulmonary disease. Electronically Signed   By: Fidela Salisbury M.D.   On: 12/07/2021 20:24   MR CARDIAC MORPHOLOGY W WO CONTRAST  Result Date: 11/12/2021 CLINICAL DATA:  29M with acute HFrEF. EF 25%. Cath with minimal CAD EXAM: CARDIAC MRI TECHNIQUE: The patient was scanned on a 1.5 Tesla Siemens magnet. A dedicated cardiac coil was used. Functional imaging was done using Fiesta sequences. 2,3, and 4 chamber views were done to assess for RWMA's. Modified Simpson's rule using a short axis stack was used to calculate an ejection fraction on a dedicated work Conservation officer, nature. The patient received 12 cc of Gadavist. After 10 minutes inversion recovery sequences were used to assess for infiltration and scar tissue. CONTRAST:  12 cc  of Gadavist FINDINGS: Left ventricle: -Normal size -Mild hypertrophy -Mild systolic dysfunction.  Apical hypokinesis -Mild ECV elevation (29% in mid segments, up to 37% in apical segments) -Elevated T2 values in all pical segments. -Basal septal midwall LGE LV EF: 42% (Normal 56-78%) Absolute volumes: LV EDV: 121m (Normal 77-195 mL) LV ESV: 820m(Normal 19-72 mL) LV SV: 6076mNormal 51-133 mL) CO: 4.9L/min (Normal 2.8-8.8 L/min) Indexed volumes: LV EDV: 5m69m-m (Normal 47-92 mL/sq-m) LV ESV: 37mL25mm (Normal 13-30 mL/sq-m) LV SV: 27mL/80m (Normal 32-62 mL/sq-m) CI: 2.3L/min/sq-m (Normal 1.7-4.2 L/min/sq-m) Right ventricle: Normal size and low normal systolic function RV EF:  47% (Normal 47-74%) Absolute volumes: RV EDV: 141mL (44mal 88-227 mL) RV ESV: 75mL (N46ml 23-103 mL) RV SV: 66mL (No83m 52-138 mL) CO: 5.4L/min (Normal 2.8-8.8 L/min) Indexed volumes: RV EDV: 5mL/sq-m40mrmal 55-105  mL/sq-m) RV ESV: 34mL/sq-m 26mmal 15-43 mL/sq-m) RV SV: 30mL/sq-m (90mal 32-64 mL/sq-m) CI: 2.5L/min/sq-m (Normal 1.7-4.2 L/min/sq-m) Left atrium: Normal size Right atrium: Normal size Mitral valve: No regurgitation Aortic valve: No regurgitation Tricuspid valve: No regurgitation Pulmonic valve: No regurgitation Aorta: Normal proximal ascending aorta Pericardium: Normal IMPRESSION: 1. Normal LV size, mild hypertrophy, and mild systolic dysfunction (EF 42%). Apical51%pokinesis 2. Basal septal midwall late gadolinium enhancement, which is a scar pattern seen in nonischemic cardiomyopathies 3.  Normal RV size and low normal systolic function (EF 47%) 4. Elev88%d myocardial T2 values at all apical segments suggesting myocardial edema 5. Differential diagnosis includes acute myocarditis vs Takotsubo cardiomyopathy. Meets criteria for acute myocarditis given regional elevations in T1/ECV/T2 values and LGE. However, suspect more likely Takotsubo cardiomyopathy given apical wall motion abnormalities, myocardial edema in all apical segments, and only mild troponin elevation. While LGE is often absent in Takotsubo, it can be seen. 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 Electronically Signed   By: Christopher Oswaldo Milian04/28/2023 18:48    Labs:  Recent Labs    12/07/21 2006 12/08/21 0127 12/09/21 0340  WBC 8.3 8.6 5.0  HGB 12.4* 12.7* 10.2*  HCT 36.0* 36.1* 29.8*  PLT 256 224 180   Recent Labs    12/07/21 2006 12/08/21 0127 12/09/21 0340  NA 135 136  139  K 3.0* 4.0 4.3  CL 97* 97* 103  CO2 _0 GLUCOSE 106* 102* 104*  BUN _1 CREATININE 1.64* 1.30* 1.32*  CALCIUM 11.9* 12.0* 10.3   Recent Labs    12/09/21 0340  PROT 4.8*  ALBUMIN 2.2*  AST 23  ALT 13  ALKPHOS 56  BILITOT 0.8   No results for input(s): HEPBSAG, HCVAB, HEPAIGM, HEPBIGM in the last 72 hours. No results for input(s): LABPROT, INR in the last 72 hours.  Past Medical History:  Diagnosis Date   Hypertension      Past Surgical History:  Procedure Laterality Date   HERNIA REPAIR     INTRAMEDULLARY (IM) NAIL INTERTROCHANTERIC Right 11/11/2014   Procedure: INTRAMEDULLARY (IM) NAIL INTERTROCHANTRIC;  Surgeon: Paralee Cancel, MD;  Location: [facility];  Service: Orthopedics;  Laterality: Right;   RIGHT/LEFT HEART CATH AND CORONARY ANGIOGRAPHY N/A 11/11/2021   Procedure: RIGHT/LEFT HEART CATH AND CORONARY ANGIOGRAPHY;  Surgeon: Jolaine Artist, MD;  Location: [facility];  Service: Cardiovascular;  Laterality: N/A;   Rod right leg      Family History  Problem Relation Age of Onset   Diabetes Mother     Prior to Admission medications   Medication Sig Start Date End Date Taking? Authorizing Provider  albuterol (VENTOLIN HFA) 108 (90 Base) MCG/ACT inhaler INHALE 2 PUFFS INTO THE LUNGS EVERY 6 (SIX) HOURS AS NEEDED FOR WHEEZING OR SHORTNESS OF BREATH (COUGH). Patient taking differently: Inhale 2 puffs into the lungs every 6 (six) hours as needed for wheezing or shortness of breath. 05/10/21  Yes Gildardo Pounds, NP  amiodarone (PACERONE) 200 MG tablet Take 1 tablet (200 mg total) by mouth daily. 11/16/21  Yes Lajean Manes, MD  apixaban (ELIQUIS) 5 MG TABS tablet Take 1 tablet (5 mg total) by mouth 2 (two) times daily. 11/12/21  Yes Masters, Katie, DO  fluticasone furoate-vilanterol (BREO ELLIPTA) 100-25 MCG/ACT AEPB Inhale 1 puff into the lungs daily. 11/29/21  Yes Charlott Rakes, MD  folic acid (FOLVITE) 1 MG tablet Take 1 tablet (1 mg total) by mouth daily. 11/13/21  Yes Masters, Katie, DO  furosemide (LASIX) 20 MG tablet Take 1 tablet (20 mg total) by mouth daily as needed for edema or fluid. For weight gain of 3 lbs in 24 hours or 5 lbs in a week 11/22/21  Yes Milford, Maricela Bo, FNP  gabapentin (NEURONTIN) 300 MG capsule Take 1 capsule (300 mg total) by mouth at bedtime. 11/26/21 02/24/22 Yes Gildardo Pounds, NP  GLUCOS-CHONDROIT-MSM-C-HYAL PO Take 1 tablet by mouth daily.   Yes [provider]  magnesium oxide (MAG-OX) 400 MG tablet Take 1 tablet (400 mg total) by mouth daily. 11/24/21  Yes Milford, Maricela Bo, FNP  metoprolol succinate (TOPROL-XL) 25 MG 24 hr tablet Take 25 mg by mouth daily.   Yes [provider]  Multiple Vitamin (MULTIVITAMIN WITH MINERALS) TABS tablet Take 1 tablet by mouth daily.   Yes [provider]  naltrexone (DEPADE) 50 MG tablet Take 1 tablet (50 mg total) by mouth daily. 11/12/21  Yes Masters, Katie, DO  naproxen sodium (ALEVE) 220 MG tablet Take 220 mg by mouth 2 (two) times daily as needed (moderate pain).   Yes [provider]  sacubitril-valsartan (ENTRESTO) 97-103 MG Take 1 tablet by mouth 2 (two) times daily. 12/01/21  Yes Bensimhon, Shaune Pascal, MD  spironolactone (ALDACTONE) 25 MG tablet Take 1 tablet (25 mg total) by mouth daily. 11/13/21  Yes Masters, Katie, DO  ferrous sulfate 325 (65 FE) MG tablet Take 1 tablet (325 mg total) by mouth 2 (two) times daily with a meal. Patient not taking: Reported on 12/07/2021 03/05/20 11/23/22  Gildardo Pounds, NP    Current Facility-Administered Medications  Medication Dose Route Frequency Provider Last Rate Last Admin   acetaminophen (TYLENOL) tablet 650 mg  650 mg Oral Q6H PRN Vernelle Emerald, MD   650 mg at 12/08/21 2118   Or   acetaminophen (TYLENOL) suppository 650 mg  650 mg Rectal Q6H PRN Vernelle Emerald, MD       albuterol (PROVENTIL) (2.5 MG/3ML) 0.083% nebulizer solution 2.5 mg  2.5 mg Nebulization Q4H PRN Shalhoub, Sherryll Burger, MD       amiodarone (PACERONE) tablet 200 mg  200 mg Oral Daily Shalhoub, Sherryll Burger, MD   200 mg at 12/09/21 0924   fluticasone furoate-vilanterol (BREO ELLIPTA) 100-25 MCG/ACT 1 puff  1 puff Inhalation Daily Shalhoub, Sherryll Burger, MD   1 puff at 16/10/96 0454   folic acid (FOLVITE) tablet 1 mg  1 mg Oral Daily Shalhoub, Sherryll Burger, MD   1 mg at 12/09/21 0981   gabapentin (NEURONTIN) capsule 300 mg  300 mg Oral QHS Vernelle Emerald, MD   300 mg at 12/08/21  2118   metoprolol succinate (TOPROL-XL) 24 hr tablet 25 mg  25 mg Oral Daily Vernelle Emerald, MD   25 mg at 12/09/21 1914   multivitamin with minerals tablet 1 tablet  1 tablet Oral Daily Shalhoub, Sherryll Burger, MD   1 tablet at 12/09/21 0924   ondansetron (ZOFRAN) tablet 4 mg  4 mg Oral Q6H PRN Vernelle Emerald, MD       Or   ondansetron [facility]) injection 4 mg  4 mg Intravenous Q6H PRN Shalhoub, Sherryll Burger, MD       polyethylene glycol (MIRALAX / GLYCOLAX) packet 17 g  17 g Oral Daily PRN Vernelle Emerald, MD   17 g at 12/09/21 1036   thiamine tablet 100 mg  100 mg Oral Daily Shalhoub, Sherryll Burger, MD   100 mg at 12/09/21 7829   Or   thiamine (B-1) injection 100 mg  100 mg Intravenous Daily Shalhoub, Sherryll Burger, MD        Allergies as of 12/07/2021   (No Known Allergies)    Social History   Socioeconomic History   Marital status: Single    Spouse name: Not on file   Number of children: Not on file   Years of education: Not on file   Highest education level: Not on file  Occupational History   Not on file  Tobacco Use   Smoking status: Some Days    Packs/day: 0.25    Types: Cigarettes    Passive exposure: Never   Smokeless tobacco: Never  Vaping Use   Vaping Use: Never used  Substance and Sexual Activity   Alcohol use: Not Currently   Drug use: Not Currently   Sexual activity: Yes  Other Topics Concern   Not on file  Social History Narrative   ** Merged History Encounter **       Social Determinants of Health   Financial Resource Strain: Not on file  Food Insecurity: Not on file  Transportation Needs: Not on file  Physical Activity: Not on file  Stress: Not on file  Social Connections: Not on file  Intimate Partner Violence: Not on file    Review of Systems:  All systems reviewed and negative except where noted in HPI.  Physical Exam: Vital signs in last 24 hours: Temp:  [97.6 F (36.4 C)-98 F (36.7 C)] 97.6 F (36.4 C) (05/24 2013) Pulse Rate:  [75-80] 75  (05/24 2013) Resp:  [18-20] 18 (05/24 2013) BP: (123-125)/(81-82) 123/81 (05/24 2013) SpO2:  [92 %-96 %] 96 % (05/25 0804) Last BM Date : 12/06/21  General:  Alert male in NAD Psych:  Pleasant, cooperative. Normal mood and affect Eyes: Pupils equal, no icterus. Conjunctive pink Ears:  Normal auditory acuity Nose: No deformity, discharge or lesions Neck:  Supple, no masses felt Lungs:  Clear to auscultation.  Heart:  Regular rate,  No lower extremity edema Abdomen:  Soft, nondistended, nontender, active bowel sounds, no masses felt Rectal :  Deferred Msk: Symmetrical without gross deformities.  Neurologic:  Alert, oriented, grossly normal neurologically Skin:  Intact without significant lesions.    Intake/Output from previous day: 05/24 0701 - 05/25 0700 In: 1580.2 [P.O.:1080; IV Piggyback:500.2] Out: 950 [Urine:950] Intake/Output this shift:  Total I/O In: -  Out: 700 [Urine:700]    Principal Problem:   Syncope Active Problems:   Hypercalcemia   Hypokalemia   Emphysema lung (HCC)   Essential hypertension   AKI (acute kidney injury) ([place])   Chronic systolic CHF (congestive heart failure) (HCC)   Alcohol abuse   Paroxysmal atrial fibrillation ([place])    Tye Savoy, NP-C @  12/09/2021, 2:39 PM

## 2021-12-09 NOTE — Consult Note (Addendum)
Consultation Note   Referring Provider: Teaching Service PCP: Luis Pounds, NP Primary Gastroenterologist: Luis Marquez  Reason for consultation: Abnormal esophagus on imaging  Hospital Day: 3   Attending physician's note  I have taken a history, reviewed the chart and examined the patient. I performed a substantive portion of this encounter, including complete performance of at least one of the key components, in conjunction with the APP. I agree with the APP's note, impression and recommendations.    61 year old very pleasant gentleman with history of chronic alcohol use, acute systolic heart failure, A-fib on Eliquis was noted to have incidental esophageal thickening on CT abdomen pelvis that was done to evaluate for hypercalcemia and unintentional weight loss  He reports intermittent dysphagia with both solids and liquids.  He also has on and off heartburn and dyspepsia symptoms Never had EGD  We will plan to proceed with EGD tomorrow a.m. for further evaluation of esophageal thickening, obtain biopsies.   N.p.o. after midnight The risks and benefits as well as alternatives of endoscopic procedure(s) have been discussed and reviewed. All questions answered. The patient agrees to proceed.  The patient was provided an opportunity to ask questions and all were answered. The patient agreed with the plan and demonstrated an understanding of the instructions.  Luis Marquez , MD [phone]     Assessment   61 yo male admitted 11/02/21 to 11/16/21 Etoh withdrawal, acute systolic heart failure, electrolyte derangements including markedly elevated serum Ca+.   Syncopal episode, reason for readmission.  Felt to be secondary to hypotension related to BP meds.   Hypercalcemia, improved. Prior evaluation negative including multiple myeloma or hyperparathyroidism. There was concern for underlying malignancy and outpatient PET was  recommended.  Ca+ 10.3 this admission, it was 14 last month  History of Etoh abuse. No evidence of cirrhosis on CT scan Withdrawals during recent admission. Still drinking, last drink was on Tuesday  Afib with RVR, on Eliquis at home.   One month history of dysphagia to solids. / liquids, heartburn. Mild esophageal wall thickening on CT scan ( done to evaluate for a malignancy as cause for elevated Ca+).   Esophageal findings may represent esophagitis, hiatal hernia or possible malignancy. His weight is down from 208 Marquez a month ago to 186 Marquez. Weight loss probably multifactorial ( ? Diuretics, recent prolonged admission, diminished appetite)  Macrocytic anemia / History of B12 deficiency in Aug 2022 ( B12 201). Developed during recent admission. IV fluid probably causing some dilution. Hgb 10.7.   See PMH for additional medical problems   Plan   He needs an EGD. The risks and benefits of EGD with possible biopsies were discussed with the patient who agrees to proceed. Eliquis has been held, his last dose was this am. Will see if Luis Marquez is comfortable proceeding with EGD tomorrow as we generally hold it 48 hours for procedures.   Will repeat B12 level. Check folate and iron studies. Iron levels may be high from Etoh Outpatient screening colonoscopy at some point  HPI   Luis Marquez is a 61 y.o. male with a past medical history significant for  HTN, Etoh abuse, Afib with RVR on Eliquis, non-obstructive CAD , systolic heart failure ( Echo April  2023 EF 20-25%), and COPD.  See PMH for any additional medical problems.  Patient was hospitalized earlier this month with severe hypercalcemia, Etoh withdrawal, AKI, and acute heart failure. There was concern for Takotsubo initially.  Right and left heart cath was completed that showed high output heart failure with normal filling pressures. Cardiac MRI was completed and showed EF improved on 4/28 MRI from 25% to 42% . 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  He had multiple electrolyte abnormalities including a Ca+ of 15. Extensive evaluation was done and unrevealing. A CT scan in 2020 showed pulmonary nodules. Annual screening was recommended but patient didn't have it. Upon discharge it was recommended he get a PET to evaluate for malignancy as cause for hypercalcemia.  Discharged home on 5/2.   Patient came back to ED two days ago. Brought by EMS, he had fallen out of his car. Apparently had a syncopal episode. Sustained head laceration. On arrival his SBP was in 80's. Head / spine imaging without acute findings. It is thought that the syncopal episode was from hypotension related to BP meds.Ca+ improved but still elevated.  CT scan obtained to evaluate for a malignancy >> 3.3 cm AAA, mild circumferential distal esophageal thickening seen.   Over the last two months Luis Marquez has been having heartburn and also dysphagia to both solids and liquids. He has been treating the heartburn with Tums. No vomiting. His weight is down over the last month but he was hospitalized, has been taking diuretics. No [place] of esophageal / stomach / colon cancer. He had a colonoscopy in [place] about 8 years ago, doesn't think he had polyps   Previous GI Evaluation      Recent Labs and Imaging CT Head Wo Contrast  Result Date: 12/07/2021 CLINICAL DATA:  Status post fall. EXAM: CT HEAD WITHOUT CONTRAST TECHNIQUE: Contiguous axial images were obtained from the base of the skull through the vertex without intravenous contrast. RADIATION DOSE REDUCTION: This exam was performed according to the departmental dose-optimization program which includes automated exposure control, adjustment of the mA and/or kV according to patient size and/or use of iterative reconstruction technique. COMPARISON:  November 02, 2021 FINDINGS: Brain: There is mild cerebral atrophy with widening of the extra-axial spaces and ventricular dilatation. There  are areas of decreased attenuation within the white matter tracts of the supratentorial brain, consistent with microvascular disease changes. Vascular: No hyperdense vessel or unexpected calcification. Skull: Normal. Negative for fracture or focal lesion. Sinuses/Orbits: No acute finding. Other: None. IMPRESSION: 1. No acute intracranial abnormality. 2. Generalized cerebral atrophy with chronic white matter small vessel ischemic changes. Electronically Signed   By: Virgina Norfolk M.D.   On: 12/07/2021 22:22   CT Cervical Spine Wo Contrast  Result Date: 12/07/2021 CLINICAL DATA:  Status post fall. EXAM: CT CERVICAL SPINE WITHOUT CONTRAST TECHNIQUE: Multidetector CT imaging of the cervical spine was performed without intravenous contrast. Multiplanar CT image reconstructions were also generated. RADIATION DOSE REDUCTION: This exam was performed according to the departmental dose-optimization program which includes automated exposure control, adjustment of the mA and/or kV according to patient size and/or use of iterative reconstruction technique. COMPARISON:  None Available. FINDINGS: Alignment: There is reversal of the normal cervical spine lordosis with 3.5 mm anterolisthesis of the C2 vertebral body on C3. Approximately 3 mm retrolisthesis of C4 is noted on C5. Skull base and vertebrae: No acute fracture. No primary bone lesion or focal pathologic process. Soft tissues and spinal canal: No prevertebral  fluid or swelling. No visible canal hematoma. Disc levels: There is marked severity endplate sclerosis and anterior osteophyte formation at the levels of C3-C4, C4-C5, C5-C6 and C6-C7. Marked severity intervertebral disc space narrowing is also seen at these levels. Bilateral marked severity multilevel facet joint hypertrophy is noted. Upper chest: Negative. Other: None. IMPRESSION: 1. Marked severity multilevel degenerative changes without evidence of an acute fracture. Electronically Signed   By: Virgina Norfolk M.D.   On: 12/07/2021 22:25   CT ABDOMEN PELVIS W CONTRAST  Result Date: 12/09/2021 A CLINICAL DATA: 61 year old male presents for evaluation of potential metastatic disease. EXAM: CT ABDOMEN AND PELVIS WITH CONTRAST TECHNIQUE: Multidetector CT imaging of the abdomen and pelvis was performed using the standard protocol following bolus administration of intravenous contrast. RADIATION DOSE REDUCTION: This exam was performed according to the departmental dose-optimization program which includes automated exposure control, adjustment of the mA and/or kV according to patient size and/or use of iterative reconstruction technique. CONTRAST:  128m OMNIPAQUE IOHEXOL 300 MG/ML  SOLN COMPARISON:  CT of the chest from November 02, 2021. FINDINGS: Lower chest: Lung bases are clear. No effusion or consolidative changes. Hepatobiliary: No focal, suspicious hepatic lesion. No pericholecystic stranding. No biliary duct dilation. Portal vein is patent. Pancreas: Normal contour.  No signs of pancreatic inflammation. Spleen: Normal size and contour. Adrenals/Urinary Tract: Adrenal glands are unremarkable. Symmetric renal enhancement. No sign of hydronephrosis. No suspicious renal lesion or perinephric stranding. Urinary bladder is grossly unremarkable. Small low-density lesion arising from the inferior RIGHT kidney compatible with a small cyst measuring 18 mm. No follow-up recommended for this finding. Other small low-density lesions too small for definitive characterization in the bilateral kidneys, statistically cysts. No dedicated follow-up recommended for these findings. Based on current guidelines. Stomach/Bowel: Mild circumferential distal esophageal thickening. No signs of stranding adjacent to the stomach. No signs of small bowel dilation or inflammation. Appendix is normal. No signs of colonic thickening or pericolonic stranding. Sigmoid diverticular changes without adjacent inflammation. Vascular/Lymphatic: 3.3 x  3.2 cm infrarenal abdominal aortic aneurysm of the infrarenal abdominal aorta. Eccentric soft plaque and moderate to marked calcified atherosclerosis of the abdominal aorta. No adenopathy in the abdomen. No adenopathy in the pelvis. Reproductive: Unremarkable by CT. Other: LEFT inguinal herniorrhaphy.  No free air or ascites. Musculoskeletal: No acute bone finding. No destructive bone process. Spinal degenerative changes. Degenerative changes about the RIGHT hip post RIGHT femoral ORIF of the proximal RIGHT femur. IMPRESSION: 1. No acute findings in the abdomen or pelvis. 2. Mild circumferential distal esophageal thickening. Correlate with any signs of esophagitis with dedicated esophageal evaluation as warranted. 3. 3.3 cm infrarenal abdominal aortic aneurysm. Recommend follow-up every 3 years. Reference: J Am Coll Radiol 27619;50:[id number] 4. Sigmoid diverticular changes without evidence of acute diverticulitis. Aortic Atherosclerosis (ICD10-I70.0). Electronically Signed   By: GZetta BillsM.D.   On: 12/09/2021 12:44   CARDIAC CATHETERIZATION  Result Date: 11/11/2021   Prox Cx lesion is 20% stenosed.   Mid LAD lesion is 30% stenosed.   The left ventricular ejection fraction is 25-35% by visual estimate. Findings: Ao = 119/68 (90) LV = 119/17 RA =  3 RV = 27/6 PA = 25/9 (15) PCW = 10 Fick cardiac output/index = 8.2/3.7 PVR = 0.6 WU Ao sat = 94% PA sat = 72%, 73% SVC sat = 74% Assessment: 1. Mild non-obstructive CAD 2. NICM - EF hard to estimate from v-gram but seems to be in the 30-35% range 3. Normal filling pressures with  high cardiac output 4. No evidence of intracardiac shunting Plan/Discussion: Medical therapy. Consider repeat limited echo prior to d/c to reassess for EF improvement. Glori Bickers, MD 1:31 PM  DG Chest Portable 1 View  Result Date: 12/07/2021 CLINICAL DATA:  Fall, chest injury EXAM: PORTABLE CHEST 1 VIEW COMPARISON:  01/07/2021 FINDINGS: The lungs appear hyperinflated in keeping  with changes of underlying COPD. The lungs are clear. No pneumothorax or pleural effusion. Cardiac size within normal limits. Pulmonary vascularity is normal. No acute bone abnormality. Healed rib fractures are seen bilaterally. IMPRESSION: No radiographic evidence of acute cardiopulmonary disease. Electronically Signed   By: Fidela Salisbury M.D.   On: 12/07/2021 20:24   MR CARDIAC MORPHOLOGY W WO CONTRAST  Result Date: 11/12/2021 CLINICAL DATA:  29M with acute HFrEF. EF 25%. Cath with minimal CAD EXAM: CARDIAC MRI TECHNIQUE: The patient was scanned on a 1.5 Tesla Siemens magnet. A dedicated cardiac coil was used. Functional imaging was done using Fiesta sequences. 2,3, and 4 chamber views were done to assess for RWMA's. Modified Simpson's rule using a short axis stack was used to calculate an ejection fraction on a dedicated work Conservation officer, nature. The patient received 12 cc of Gadavist. After 10 minutes inversion recovery sequences were used to assess for infiltration and scar tissue. CONTRAST:  12 cc  of Gadavist FINDINGS: Left ventricle: -Normal size -Mild hypertrophy -Mild systolic dysfunction.  Apical hypokinesis -Mild ECV elevation (29% in mid segments, up to 37% in apical segments) -Elevated T2 values in all pical segments. -Basal septal midwall LGE LV EF: 42% (Normal 56-78%) Absolute volumes: LV EDV: 121m (Normal 77-195 mL) LV ESV: 820m(Normal 19-72 mL) LV SV: 6076mNormal 51-133 mL) CO: 4.9L/min (Normal 2.8-8.8 L/min) Indexed volumes: LV EDV: 5m69m-m (Normal 47-92 mL/sq-m) LV ESV: 37mL25mm (Normal 13-30 mL/sq-m) LV SV: 27mL/80m (Normal 32-62 mL/sq-m) CI: 2.3L/min/sq-m (Normal 1.7-4.2 L/min/sq-m) Right ventricle: Normal size and low normal systolic function RV EF:  47% (Normal 47-74%) Absolute volumes: RV EDV: 141mL (44mal 88-227 mL) RV ESV: 75mL (N46ml 23-103 mL) RV SV: 66mL (No83m 52-138 mL) CO: 5.4L/min (Normal 2.8-8.8 L/min) Indexed volumes: RV EDV: 5mL/sq-m40mrmal 55-105  mL/sq-m) RV ESV: 34mL/sq-m 26mmal 15-43 mL/sq-m) RV SV: 30mL/sq-m (90mal 32-64 mL/sq-m) CI: 2.5L/min/sq-m (Normal 1.7-4.2 L/min/sq-m) Left atrium: Normal size Right atrium: Normal size Mitral valve: No regurgitation Aortic valve: No regurgitation Tricuspid valve: No regurgitation Pulmonic valve: No regurgitation Aorta: Normal proximal ascending aorta Pericardium: Normal IMPRESSION: 1. Normal LV size, mild hypertrophy, and mild systolic dysfunction (EF 42%). Apical51%pokinesis 2. Basal septal midwall late gadolinium enhancement, which is a scar pattern seen in nonischemic cardiomyopathies 3.  Normal RV size and low normal systolic function (EF 47%) 4. Elev88%d myocardial T2 values at all apical segments suggesting myocardial edema 5. Differential diagnosis includes acute myocarditis vs Takotsubo cardiomyopathy. Meets criteria for acute myocarditis given regional elevations in T1/ECV/T2 values and LGE. However, suspect more likely Takotsubo cardiomyopathy given apical wall motion abnormalities, myocardial edema in all apical segments, and only mild troponin elevation. While LGE is often absent in Takotsubo, it can be seen. 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 Electronically Signed   By: Christopher Oswaldo Milian04/28/2023 18:48    Labs:  Recent Labs    12/07/21 2006 12/08/21 0127 12/09/21 0340  WBC 8.3 8.6 5.0  HGB 12.4* 12.7* 10.2*  HCT 36.0* 36.1* 29.8*  PLT 256 224 180   Recent Labs    12/07/21 2006 12/08/21 0127 12/09/21 0340  NA 135 136  139  K 3.0* 4.0 4.3  CL 97* 97* 103  CO2 _0 GLUCOSE 106* 102* 104*  BUN _1 CREATININE 1.64* 1.30* 1.32*  CALCIUM 11.9* 12.0* 10.3   Recent Labs    12/09/21 0340  PROT 4.8*  ALBUMIN 2.2*  AST 23  ALT 13  ALKPHOS 56  BILITOT 0.8   No results for input(s): HEPBSAG, HCVAB, HEPAIGM, HEPBIGM in the last 72 hours. No results for input(s): LABPROT, INR in the last 72 hours.  Past Medical History:  Diagnosis Date   Hypertension      Past Surgical History:  Procedure Laterality Date   HERNIA REPAIR     INTRAMEDULLARY (IM) NAIL INTERTROCHANTERIC Right 11/11/2014   Procedure: INTRAMEDULLARY (IM) NAIL INTERTROCHANTRIC;  Surgeon: Paralee Cancel, MD;  Location: [facility];  Service: Orthopedics;  Laterality: Right;   RIGHT/LEFT HEART CATH AND CORONARY ANGIOGRAPHY N/A 11/11/2021   Procedure: RIGHT/LEFT HEART CATH AND CORONARY ANGIOGRAPHY;  Surgeon: Jolaine Artist, MD;  Location: [facility];  Service: Cardiovascular;  Laterality: N/A;   Rod right leg      Family History  Problem Relation Age of Onset   Diabetes Mother     Prior to Admission medications   Medication Sig Start Date End Date Taking? Authorizing Provider  albuterol (VENTOLIN HFA) 108 (90 Base) MCG/ACT inhaler INHALE 2 PUFFS INTO THE LUNGS EVERY 6 (SIX) HOURS AS NEEDED FOR WHEEZING OR SHORTNESS OF BREATH (COUGH). Patient taking differently: Inhale 2 puffs into the lungs every 6 (six) hours as needed for wheezing or shortness of breath. 05/10/21  Yes Luis Pounds, NP  amiodarone (PACERONE) 200 MG tablet Take 1 tablet (200 mg total) by mouth daily. 11/16/21  Yes Lajean Manes, MD  apixaban (ELIQUIS) 5 MG TABS tablet Take 1 tablet (5 mg total) by mouth 2 (two) times daily. 11/12/21  Yes Masters, Katie, DO  fluticasone furoate-vilanterol (BREO ELLIPTA) 100-25 MCG/ACT AEPB Inhale 1 puff into the lungs daily. 11/29/21  Yes Charlott Rakes, MD  folic acid (FOLVITE) 1 MG tablet Take 1 tablet (1 mg total) by mouth daily. 11/13/21  Yes Masters, Katie, DO  furosemide (LASIX) 20 MG tablet Take 1 tablet (20 mg total) by mouth daily as needed for edema or fluid. For weight gain of 3 lbs in 24 hours or 5 lbs in a week 11/22/21  Yes Milford, Maricela Bo, FNP  gabapentin (NEURONTIN) 300 MG capsule Take 1 capsule (300 mg total) by mouth at bedtime. 11/26/21 02/24/22 Yes Luis Pounds, NP  GLUCOS-CHONDROIT-MSM-C-HYAL PO Take 1 tablet by mouth daily.   Yes [provider]  magnesium oxide (MAG-OX) 400 MG tablet Take 1 tablet (400 mg total) by mouth daily. 11/24/21  Yes Milford, Maricela Bo, FNP  metoprolol succinate (TOPROL-XL) 25 MG 24 hr tablet Take 25 mg by mouth daily.   Yes [provider]  Multiple Vitamin (MULTIVITAMIN WITH MINERALS) TABS tablet Take 1 tablet by mouth daily.   Yes [provider]  naltrexone (DEPADE) 50 MG tablet Take 1 tablet (50 mg total) by mouth daily. 11/12/21  Yes Masters, Katie, DO  naproxen sodium (ALEVE) 220 MG tablet Take 220 mg by mouth 2 (two) times daily as needed (moderate pain).   Yes [provider]  sacubitril-valsartan (ENTRESTO) 97-103 MG Take 1 tablet by mouth 2 (two) times daily. 12/01/21  Yes Bensimhon, Shaune Pascal, MD  spironolactone (ALDACTONE) 25 MG tablet Take 1 tablet (25 mg total) by mouth daily. 11/13/21  Yes Masters, Katie, DO  ferrous sulfate 325 (65 FE) MG tablet Take 1 tablet (325 mg total) by mouth 2 (two) times daily with a meal. Patient not taking: Reported on 12/07/2021 03/05/20 11/23/22  Luis Pounds, NP    Current Facility-Administered Medications  Medication Dose Route Frequency Provider Last Rate Last Admin   acetaminophen (TYLENOL) tablet 650 mg  650 mg Oral Q6H PRN Vernelle Emerald, MD   650 mg at 12/08/21 2118   Or   acetaminophen (TYLENOL) suppository 650 mg  650 mg Rectal Q6H PRN Vernelle Emerald, MD       albuterol (PROVENTIL) (2.5 MG/3ML) 0.083% nebulizer solution 2.5 mg  2.5 mg Nebulization Q4H PRN Shalhoub, Sherryll Burger, MD       amiodarone (PACERONE) tablet 200 mg  200 mg Oral Daily Shalhoub, Sherryll Burger, MD   200 mg at 12/09/21 0924   fluticasone furoate-vilanterol (BREO ELLIPTA) 100-25 MCG/ACT 1 puff  1 puff Inhalation Daily Shalhoub, Sherryll Burger, MD   1 puff at 16/10/96 0454   folic acid (FOLVITE) tablet 1 mg  1 mg Oral Daily Shalhoub, Sherryll Burger, MD   1 mg at 12/09/21 0981   gabapentin (NEURONTIN) capsule 300 mg  300 mg Oral QHS Vernelle Emerald, MD   300 mg at 12/08/21  2118   metoprolol succinate (TOPROL-XL) 24 hr tablet 25 mg  25 mg Oral Daily Vernelle Emerald, MD   25 mg at 12/09/21 1914   multivitamin with minerals tablet 1 tablet  1 tablet Oral Daily Shalhoub, Sherryll Burger, MD   1 tablet at 12/09/21 0924   ondansetron (ZOFRAN) tablet 4 mg  4 mg Oral Q6H PRN Vernelle Emerald, MD       Or   ondansetron [facility]) injection 4 mg  4 mg Intravenous Q6H PRN Shalhoub, Sherryll Burger, MD       polyethylene glycol (MIRALAX / GLYCOLAX) packet 17 g  17 g Oral Daily PRN Vernelle Emerald, MD   17 g at 12/09/21 1036   thiamine tablet 100 mg  100 mg Oral Daily Shalhoub, Sherryll Burger, MD   100 mg at 12/09/21 7829   Or   thiamine (B-1) injection 100 mg  100 mg Intravenous Daily Shalhoub, Sherryll Burger, MD        Allergies as of 12/07/2021   (No Known Allergies)    Social History   Socioeconomic History   Marital status: Single    Spouse name: Not on file   Number of children: Not on file   Years of education: Not on file   Highest education level: Not on file  Occupational History   Not on file  Tobacco Use   Smoking status: Some Days    Packs/day: 0.25    Types: Cigarettes    Passive exposure: Never   Smokeless tobacco: Never  Vaping Use   Vaping Use: Never used  Substance and Sexual Activity   Alcohol use: Not Currently   Drug use: Not Currently   Sexual activity: Yes  Other Topics Concern   Not on file  Social History Narrative   ** Merged History Encounter **       Social Determinants of Health   Financial Resource Strain: Not on file  Food Insecurity: Not on file  Transportation Needs: Not on file  Physical Activity: Not on file  Stress: Not on file  Social Connections: Not on file  Intimate Partner Violence: Not on file    Review of Systems:  All systems reviewed and negative except where noted in HPI.  Physical Exam: Vital signs in last 24 hours: Temp:  [97.6 F (36.4 C)-98 F (36.7 C)] 97.6 F (36.4 C) (05/24 2013) Pulse Rate:  [75-80] 75  (05/24 2013) Resp:  [18-20] 18 (05/24 2013) BP: (123-125)/(81-82) 123/81 (05/24 2013) SpO2:  [92 %-96 %] 96 % (05/25 0804) Last BM Date : 12/06/21  General:  Alert male in NAD Psych:  Pleasant, cooperative. Normal mood and affect Eyes: Pupils equal, no icterus. Conjunctive pink Ears:  Normal auditory acuity Nose: No deformity, discharge or lesions Neck:  Supple, no masses felt Lungs:  Clear to auscultation.  Heart:  Regular rate,  No lower extremity edema Abdomen:  Soft, nondistended, nontender, active bowel sounds, no masses felt Rectal :  Deferred Msk: Symmetrical without gross deformities.  Neurologic:  Alert, oriented, grossly normal neurologically Skin:  Intact without significant lesions.    Intake/Output from previous day: 05/24 0701 - 05/25 0700 In: 1580.2 [P.O.:1080; IV Piggyback:500.2] Out: 950 [Urine:950] Intake/Output this shift:  Total I/O In: -  Out: 700 [Urine:700]    Principal Problem:   Syncope Active Problems:   Hypercalcemia   Hypokalemia   Emphysema lung (HCC)   Essential hypertension   AKI (acute kidney injury) ([place])   Chronic systolic CHF (congestive heart failure) (HCC)   Alcohol abuse   Paroxysmal atrial fibrillation ([place])    Luis Savoy, NP-C @  12/09/2021, 2:39 PM

## 2021-12-09 NOTE — Progress Notes (Addendum)
Subjective: No acute overnight events.   Patient was seen at bedside during rounds today. Pt reports feeling well, and has no complaints. Tolerating PO intake and not does not feel dehydrated. He reports not eating and drinking well prior to admission. Thinks his new heart medications dropped his pressures.   Pt is updated on the plan for today, and all questions and concerns are addressed.   Objective:  Vital signs in last 24 hours: Vitals:   12/08/21 1524 12/08/21 1600 12/08/21 2013 12/09/21 0804  BP:  125/82 123/81   Pulse: 78 80 75   Resp: 20 20 18    Temp:  98 F (36.7 C) 97.6 F (36.4 C)   TempSrc:  Oral Axillary   SpO2: 92% 96% 95% 96%  Weight:      Height:       Constitutional: alert, well-appearing, in NAD HENT: normocephalic, atraumatic, mucous membranes moist Cardiovascular: RRR, no m/r/g, non-edematous bilateral LE Pulmonary/Chest: normal work of breathing on room air, LCTAB Abdominal: soft, non-tender to palpation, non-distended MSK: normal bulk and tone Neurological: A&O x 3 and follows commands   Skin: warm and dry Psych: normal behavior, normal affect  Assessment/Plan:  Principal Problem:   Syncope Active Problems:   Hypercalcemia   Hypokalemia   Emphysema lung (HCC)   Essential hypertension   AKI (acute kidney injury) (HCC)   Chronic systolic CHF (congestive heart failure) (HCC)   Alcohol abuse   Paroxysmal atrial fibrillation [facility])  Luis Marquez is a 61 year old gentleman with a past medical history of HFrEF secondary to Takotsubo cardiomyopathy, paroxysmal A-fib on Eliquis, alcohol use disorder on naltrexone admitted for syncope 2/2 orthostatic hypotension.   Non-Parathyroid Hypercalcemia  Corrected Ca improved from 12.9 -> 11.7 with IVF. Previous workup with suppressed PTH, negative PTHrP, and thyroid and MM ruled out. PCA negative. CT chest notable for small nodular densities between 3-6 mm with recommendation to repeat CT chest in 3-6 months.  CT A/P today with no acute findings, besides mild circumferential distal thickening. Never had an EGD. Given hx of tobacco and alcohol use, and new onset dyspepsia x 6 weeks, will consult GI for potential inpatient esophageal evaluation.  -- GI consulted, appreciate assistance. Possible EGD tmrw, depending on MD eval since he did have a dose of Eliquis this morning. Holding further doses. Discussed CT results and plans for GI to see him, he is agreeable to this.  -- Consider bisphosphonates if corrected Ca >12 and symptomatic or >14 and asymptomatic.  -- Encourage PO intake and discontinued IVF  Syncope 2/2 orthostatic hypotension  Single positional syncopal episode with no prodromal symptoms, no postictal state noted nor abnormal muscle movements while unconscious; no tongue biting or urinary incontinence noted. Overall, presentation most consistent with syncope 2/2 orthostatic hypotension given hypotensive on arrival, and positive orthostatics prior to IVF, in the setting of poor po intake, and on several medications that lower BP. Resolution of symptoms and improved AKI of fluids further support this. He is now HDS, ambulating well, and has negative orthostatics this morning. No further syncopal work up at this time. Stable for d/c pending workup above.  - Encourage oral intake  - Continue metoprolol  - Holding [place] and Arlyce Harman until he follows up with cardiology on 12/20/21   Prerenal AKI  Improved from 1.6 -> 1.3 after receiving IVF. Suspect his new baseline may be close to 1.2 since starting Entresto.  - Encourage PO intake  - Discontinued IVF - Continue to hold Entresto and [place]  till cards f/u on 6/5   HFrEF 2/2 Takosubo Cardiomyopathy Chronic and stable. He is euvolemic. IVF discontinued.  Continue to hold Entresto and spironolactone due to syncope, orthostatic hypotension (resolved), and AKI until he f/u with cards on 6/5; discussed with his cards provider. Metoprolol is continued.     Paroxysmal Atrial Fibrillation  Sinus rhythm at this time. - Continue home metoprolol  - Hold Eliquis 5 mg BID for EGD as above   Alcohol use Disorder  No evidence or concern for withdrawal on examination. He is on Naltrexone, which was held on admission for syncopal episode.  - Resumed Naltrexone at discharge   COPD - Continue home Breo and albuterol as needed   Lajean Manes, MD  Internal Medicine Resident, PGY-1 Pager: [phone] After 5pm on weekdays and 1pm on weekends: On Call pager [phone]

## 2021-12-09 NOTE — Progress Notes (Signed)
Mobility Specialist Progress Note    12/09/21 1511  Mobility  Activity Ambulated with assistance in hallway  Level of Assistance Contact guard assist, steadying assist  Assistive Device Front wheel walker  Distance Ambulated (ft) 470 ft  Activity Response Tolerated well  $Mobility charge 1 Mobility   Pt received in bed and agreeable. No complaints on walk. Returned to sitting EOB with call bell in reach.    Verdel Nation Mobility Specialist  Primary: 5N M.S. Phone: [phone] Secondary: 6N M.S. Phone: [phone]

## 2021-12-10 ENCOUNTER — Other Ambulatory Visit: Payer: Self-pay

## 2021-12-10 ENCOUNTER — Encounter (HOSPITAL_COMMUNITY)
Admission: EM | Disposition: A | Discharge status: Home / Self Care | Payer: Self-pay | Source: Home / Self Care | Attending: Internal Medicine

## 2021-12-10 ENCOUNTER — Inpatient Hospital Stay (HOSPITAL_COMMUNITY): Payer: Medicaid Other | Admitting: Certified Registered Nurse Anesthetist

## 2021-12-10 ENCOUNTER — Other Ambulatory Visit (HOSPITAL_COMMUNITY): Payer: Self-pay

## 2021-12-10 ENCOUNTER — Encounter (HOSPITAL_COMMUNITY): Payer: Self-pay | Admitting: Internal Medicine

## 2021-12-10 DIAGNOSIS — K3189 Other diseases of stomach and duodenum: Secondary | ICD-10-CM

## 2021-12-10 DIAGNOSIS — N179 Acute kidney failure, unspecified: Secondary | ICD-10-CM | POA: Diagnosis not present

## 2021-12-10 DIAGNOSIS — K2091 Esophagitis, unspecified with bleeding: Secondary | ICD-10-CM

## 2021-12-10 DIAGNOSIS — R933 Abnormal findings on diagnostic imaging of other parts of digestive tract: Secondary | ICD-10-CM

## 2021-12-10 DIAGNOSIS — K449 Diaphragmatic hernia without obstruction or gangrene: Secondary | ICD-10-CM | POA: Diagnosis not present

## 2021-12-10 DIAGNOSIS — R1319 Other dysphagia: Secondary | ICD-10-CM

## 2021-12-10 DIAGNOSIS — K209 Esophagitis, unspecified without bleeding: Secondary | ICD-10-CM

## 2021-12-10 DIAGNOSIS — K2970 Gastritis, unspecified, without bleeding: Secondary | ICD-10-CM

## 2021-12-10 DIAGNOSIS — F1010 Alcohol abuse, uncomplicated: Secondary | ICD-10-CM | POA: Diagnosis not present

## 2021-12-10 DIAGNOSIS — R55 Syncope and collapse: Secondary | ICD-10-CM | POA: Diagnosis not present

## 2021-12-10 DIAGNOSIS — R1310 Dysphagia, unspecified: Secondary | ICD-10-CM | POA: Diagnosis not present

## 2021-12-10 HISTORY — PX: HEMOSTASIS CLIP PLACEMENT: SHX6857

## 2021-12-10 HISTORY — PX: ESOPHAGOGASTRODUODENOSCOPY (EGD) WITH PROPOFOL: SHX5813

## 2021-12-10 HISTORY — PX: BIOPSY: SHX5522

## 2021-12-10 LAB — COMPREHENSIVE METABOLIC PANEL
ALT: 14 U/L (ref 0–44)
AST: 19 U/L (ref 15–41)
Albumin: 2.1 g/dL — ABNORMAL LOW (ref 3.5–5.0)
Alkaline Phosphatase: 60 U/L (ref 38–126)
Anion gap: 7 (ref 5–15)
BUN: 14 mg/dL (ref 8–23)
CO2: 27 mmol/L (ref 22–32)
Calcium: 9.1 mg/dL (ref 8.9–10.3)
Chloride: 102 mmol/L (ref 98–111)
Creatinine, Ser: 1.09 mg/dL (ref 0.61–1.24)
GFR, Estimated: >60 mL/min (ref >60)
Glucose, Bld: 92 mg/dL (ref 70–99)
Potassium: 3.4 mmol/L — ABNORMAL LOW (ref 3.5–5.1)
Sodium: 136 mmol/L (ref 135–145)
Total Bilirubin: 0.6 mg/dL (ref 0.3–1.2)
Total Protein: 4.7 g/dL — ABNORMAL LOW (ref 6.5–8.1)

## 2021-12-10 LAB — CBC
HCT: 28.6 % — ABNORMAL LOW (ref 39.0–52.0)
Hemoglobin: 10 g/dL — ABNORMAL LOW (ref 13.0–17.0)
MCH: 34.7 pg — ABNORMAL HIGH (ref 26.0–34.0)
MCHC: 35 g/dL (ref 30.0–36.0)
MCV: 99.3 fL (ref 80.0–100.0)
Platelets: 194 10*3/uL (ref 150–400)
RBC: 2.88 MIL/uL — ABNORMAL LOW (ref 4.22–5.81)
RDW: 13.6 % (ref 11.5–15.5)
WBC: 5.6 10*3/uL (ref 4.0–10.5)
nRBC: 0 % (ref 0.0–0.2)

## 2021-12-10 SURGERY — ESOPHAGOGASTRODUODENOSCOPY (EGD) WITH PROPOFOL
Anesthesia: Monitor Anesthesia Care

## 2021-12-10 MED ORDER — SUCRALFATE 1 G PO TABS
1.0000 g | ORAL_TABLET | Freq: Three times a day (TID) | ORAL | 0 refills | Status: DC
  Filled 2021-12-10: qty 56, 14d supply, fill #0

## 2021-12-10 MED ORDER — PHENYLEPHRINE 80 MCG/ML (10ML) SYRINGE FOR IV PUSH (FOR BLOOD PRESSURE SUPPORT)
PREFILLED_SYRINGE | INTRAVENOUS | Status: DC | PRN
  Administered 2021-12-10 (×2): 80 ug via INTRAVENOUS
  Administered 2021-12-10: 160 ug via INTRAVENOUS

## 2021-12-10 MED ORDER — METOPROLOL SUCCINATE ER 25 MG PO TB24
25.0000 mg | ORAL_TABLET | Freq: Every day | ORAL | 0 refills | Status: DC
  Filled 2021-12-10: qty 30, 30d supply, fill #0

## 2021-12-10 MED ORDER — PROPOFOL 10 MG/ML IV BOLUS
INTRAVENOUS | Status: DC | PRN
  Administered 2021-12-10 (×2): 20 mg via INTRAVENOUS

## 2021-12-10 MED ORDER — OXYCODONE HCL 5 MG PO TABS: 5.0000 mg | ORAL_TABLET | Freq: Once | ORAL | Status: DC | PRN

## 2021-12-10 MED ORDER — DEXMEDETOMIDINE (PRECEDEX) IN NS 20 MCG/5ML (4 MCG/ML) IV SYRINGE
PREFILLED_SYRINGE | INTRAVENOUS | Status: DC | PRN
  Administered 2021-12-10: 8 ug via INTRAVENOUS
  Administered 2021-12-10: 12 ug via INTRAVENOUS

## 2021-12-10 MED ORDER — PROPOFOL 500 MG/50ML IV EMUL
INTRAVENOUS | Status: DC | PRN
Start: 2021-12-10 — End: 2021-12-10
  Administered 2021-12-10: 100 ug/kg/min via INTRAVENOUS

## 2021-12-10 MED ORDER — ALBUTEROL SULFATE HFA 108 (90 BASE) MCG/ACT IN AERS
2.0000 | INHALATION_SPRAY | Freq: Four times a day (QID) | RESPIRATORY_TRACT | 0 refills | Status: DC | PRN
  Filled 2021-12-10: qty 8.5, 25d supply, fill #0

## 2021-12-10 MED ORDER — POTASSIUM CHLORIDE CRYS ER 20 MEQ PO TBCR
40.0000 meq | EXTENDED_RELEASE_TABLET | Freq: Once | ORAL | Status: AC
  Administered 2021-12-10: 40 meq via ORAL
  Filled 2021-12-10: qty 2

## 2021-12-10 MED ORDER — PANTOPRAZOLE SODIUM 40 MG PO TBEC
40.0000 mg | DELAYED_RELEASE_TABLET | Freq: Two times a day (BID) | ORAL | Status: DC
  Administered 2021-12-10: 40 mg via ORAL
  Filled 2021-12-10: qty 1

## 2021-12-10 MED ORDER — PANTOPRAZOLE SODIUM 40 MG PO TBEC
40.0000 mg | DELAYED_RELEASE_TABLET | Freq: Two times a day (BID) | ORAL | 0 refills | Status: DC
  Filled 2021-12-10: qty 60, 30d supply, fill #0

## 2021-12-10 MED ORDER — FLUTICASONE FUROATE-VILANTEROL 100-25 MCG/ACT IN AEPB
1.0000 | INHALATION_SPRAY | Freq: Every day | RESPIRATORY_TRACT | 0 refills | Status: DC
  Filled 2021-12-10: qty 30, 30d supply, fill #0

## 2021-12-10 MED ORDER — MAGNESIUM OXIDE 400 MG PO TABS
400.0000 mg | ORAL_TABLET | Freq: Every day | ORAL | 0 refills | Status: DC
  Filled 2021-12-10: qty 30, 30d supply, fill #0

## 2021-12-10 MED ORDER — GABAPENTIN 300 MG PO CAPS
300.0000 mg | ORAL_CAPSULE | Freq: Every day | ORAL | 0 refills | Status: DC
  Filled 2021-12-10: qty 30, 30d supply, fill #0

## 2021-12-10 MED ORDER — ONDANSETRON HCL 4 MG/2ML IJ SOLN: 4.0000 mg | Freq: Four times a day (QID) | INTRAMUSCULAR | Status: DC | PRN

## 2021-12-10 MED ORDER — AMIODARONE HCL 200 MG PO TABS
200.0000 mg | ORAL_TABLET | Freq: Every day | ORAL | 0 refills | Status: DC
  Filled 2021-12-10: qty 30, 30d supply, fill #0

## 2021-12-10 MED ORDER — NALTREXONE HCL 50 MG PO TABS
50.0000 mg | ORAL_TABLET | Freq: Every day | ORAL | 0 refills | Status: DC
  Filled 2021-12-10: qty 30, 30d supply, fill #0

## 2021-12-10 MED ORDER — OXYCODONE HCL 5 MG/5ML PO SOLN
5.0000 mg | Freq: Once | ORAL | Status: DC | PRN
  Filled 2021-12-10: qty 5

## 2021-12-10 MED ORDER — SUCRALFATE 1 G PO TABS
1.0000 g | ORAL_TABLET | Freq: Three times a day (TID) | ORAL | Status: DC
  Administered 2021-12-10: 1 g via ORAL
  Filled 2021-12-10: qty 1

## 2021-12-10 MED ORDER — FUROSEMIDE 20 MG PO TABS
20.0000 mg | ORAL_TABLET | Freq: Every day | ORAL | 0 refills | Status: DC | PRN
  Filled 2021-12-10: qty 30, 30d supply, fill #0

## 2021-12-10 MED ORDER — APIXABAN 5 MG PO TABS: 5.0000 mg | ORAL_TABLET | Freq: Two times a day (BID) | ORAL | Status: DC

## 2021-12-10 MED ORDER — ACETAMINOPHEN 325 MG PO TABS
650.0000 mg | ORAL_TABLET | Freq: Four times a day (QID) | ORAL | 0 refills | Status: DC | PRN
  Filled 2021-12-10: qty 30, 4d supply, fill #0

## 2021-12-10 SURGICAL SUPPLY — 15 items

## 2021-12-10 NOTE — Anesthesia Postprocedure Evaluation (Signed)
Anesthesia Post Note  Patient: Luis Marquez  Procedure(s) Performed: ESOPHAGOGASTRODUODENOSCOPY (EGD) WITH PROPOFOL BIOPSY HEMOSTASIS CLIP PLACEMENT     Patient location during evaluation: Endoscopy Anesthesia Type: MAC Level of consciousness: awake and alert Pain management: pain level controlled Vital Signs Assessment: post-procedure vital signs reviewed and stable Respiratory status: spontaneous breathing, nonlabored ventilation, respiratory function stable and patient connected to nasal cannula oxygen Cardiovascular status: stable and blood pressure returned to baseline Postop Assessment: no apparent nausea or vomiting Anesthetic complications: no   No notable events documented.  Last Vitals:  Vitals:   12/10/21 0850 12/10/21 0900  BP:  104/82  Pulse: 66 64  Resp: 17 15  Temp:  (!) 36.4 C  SpO2: 95% 93%    Last Pain:  Vitals:   12/10/21 0900  TempSrc:   PainSc: 0-No pain                 Royce Sciara S

## 2021-12-10 NOTE — Discharge Summary (Signed)
Name: Luis Marquez MRN: [id number] DOB: 1961-05-24 61 y.o. PCP: Gildardo Pounds, NP  Date of Admission: 12/07/2021  7:57 PM Date of Discharge:  12/10/21 Attending Physician: Dr. Cain Sieve  DISCHARGE DIAGNOSIS:  Primary Problem: Syncope 2/2 orthostatic hypotension, Hypercalcemia   Hospital Problems: Principal Problem:   Syncope Active Problems:   Hypercalcemia   Hypokalemia   Emphysema lung (HCC)   Essential hypertension   AKI (acute kidney injury) ([place])   Chronic systolic CHF (congestive heart failure) (HCC)   Alcohol abuse   Paroxysmal atrial fibrillation (HCC)   Esophageal dysphagia   Acute esophagitis   Abnormal CT scan, esophagus    DISCHARGE MEDICATIONS:   Allergies as of 12/10/2021   No Known Allergies      Medication List     STOP taking these medications    naproxen sodium 220 MG tablet Commonly known as: ALEVE   sacubitril-valsartan 97-103 MG Commonly known as: ENTRESTO   spironolactone 25 MG tablet Commonly known as: ALDACTONE       TAKE these medications    acetaminophen 325 MG tablet Commonly known as: TYLENOL Take 2 tablets (650 mg total) by mouth every 6 (six) hours as needed for mild pain (or Fever >/= 101).   albuterol 108 (90 Base) MCG/ACT inhaler Commonly known as: Ventolin HFA Inhale 2 puffs into the lungs every 6 (six) hours as needed for wheezing or shortness of breath.   amiodarone 200 MG tablet Commonly known as: PACERONE Take 1 tablet (200 mg total) by mouth daily.   Eliquis 5 MG Tabs tablet Generic drug: apixaban Take 1 tablet (5 mg total) by mouth 2 (two) times daily.   ferrous sulfate 325 (65 FE) MG tablet Take 1 tablet (325 mg total) by mouth 2 (two) times daily with a meal.   fluticasone furoate-vilanterol 100-25 MCG/ACT Aepb Commonly known as: Breo Ellipta Inhale 1 puff into the lungs daily.   folic acid 1 MG tablet Commonly known as: FOLVITE Take 1 tablet (1 mg total) by mouth daily.   furosemide 20 MG  tablet Commonly known as: LASIX Take 1 tablet (20 mg total) by mouth daily as needed for edema or fluid. For weight gain of 3 lbs in 24 hours or 5 lbs in a week   gabapentin 300 MG capsule Commonly known as: NEURONTIN Take 1 capsule (300 mg total) by mouth at bedtime.   GLUCOS-CHONDROIT-MSM-C-HYAL PO Take 1 tablet by mouth daily.   magnesium oxide 400 MG tablet Commonly known as: MAG-OX Take 1 tablet (400 mg total) by mouth daily.   metoprolol succinate 25 MG 24 hr tablet Commonly known as: TOPROL-XL Take 1 tablet (25 mg total) by mouth daily.   multivitamin with minerals Tabs tablet Take 1 tablet by mouth daily.   naltrexone 50 MG tablet Commonly known as: DEPADE Take 1 tablet (50 mg total) by mouth daily.   pantoprazole 40 MG tablet Commonly known as: PROTONIX Take 1 tablet (40 mg total) by mouth 2 (two) times daily.   sucralfate 1 g tablet Commonly known as: CARAFATE Take 1 tablet (1 g total) by mouth 4 (four) times daily -  with meals and at bedtime for 14 days.        DISPOSITION AND FOLLOW-UP:  Luis Marquez was discharged from [facility] in stable condition. At the hospital follow up visit please address:  Follow-up Recommendations: Consults: Ensure [facility] GI follow up  Labs: CBC, CMP Studies: Follow up esophageal surgical pathology  Medications: Holding Spironolactone  and Entresto until cardiology follow. Instructed to resume Eliquis on 12/11/21. Will need iron and B12 supplementation at hospital follow up    Follow-up Appointments:  Follow-up Information     Gildardo Pounds, NP. Schedule an appointment as soon as possible for a visit in 1 week(s).   Specialty: Nurse Practitioner Why: Make an appointment with your PCP for a 1 week follow up. Schedule an appointment for 1 week from today to review hospital and for lab work. Contact information: [place] [address] [phone]         [facility].  Call in 3 day(s).   Specialty: Gastroenterology Why: Please call [facility] within the next couple of days to schedule a follow up appointment to discuss your biopsy results and to also schedule a follow up endoscopy. Please make sure to do this. Contact information: [place] [phone] [place] COURSE:  Patient Summary: Syncope 2/2 orthostatic hypotension  Single positional syncopal episode with no prodromal symptoms, no postictal state noted nor abnormal muscle movements while unconscious; no tongue biting or urinary incontinence noted. Overall, presentation most consistent with syncope 2/2 orthostatic hypotension given hypotensive on arrival, and positive orthostatics prior to IVF, in the setting of poor po intake, and on several medications that lower BP. Resolution of symptoms and AKI further support this. He is HDS, ambulating well, and had negative orthostatics after receiving fluids. No further syncopal work up required. Continued home metoprolol this admission, but continued to hold [place] and Arlyce Harman until he follows up with cardiology on 12/20/21 (discussed with his provider).    Prerenal AKI  Resolved with IVF.  - Continue to hold Entresto and Arlyce Harman till cards f/u on 6/5  Non-Parathyroid Hypercalcemia  Corrected Ca improved from 12.9 -> 11.7 -> 11.2 with IVF. Previous workup with suppressed PTH, negative PTHrP, and thyroid and MM ruled out. PCA negative. CT chest notable for small nodular densities between 3-6 mm with recommendation to repeat CT chest in 3-6 months. CT A/P this admission with no acute findings, besides mild circumferential distal thickening. Never had an EGD. Given hx of tobacco and alcohol use, and new onset dyspepsia x 6 weeks, consulted GI for inpatient esophageal evaluation. EGD revealing Grade D esophagitis with bleeding. Biopsies were taken, and hemostatic clips placed. No  bleeding at the end of the procedure. 4 cm hiatal hernia present. Patchy mild erythematous mucosa without active bleeding present in the duodenal bulb. Recommended to follow up with GI in the outpatient setting for repeat EGD in 3 months to check for healing, exclude Barrett's esophagus, dysplasia, or malignancy. Continue Protonix 40 mg BID and Sucralfate 1g QID for 14 days. Resume Eliquis 12/11/21. Ensure compliance and GI follow up during hospital follow up.    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 Remained euvolemic. Continue to hold Entresto and spironolactone due to syncope, orthostatic hypotension (resolved), and AKI until he f/u with cards on 6/5; discussed with his cards provider. Metoprolol is continued.    Paroxysmal Atrial Fibrillation  Sinus rhythm at this time. - Continue home metoprolol  - Held Eliquis 5 mg BID for EGD but instructed to resume on 12/11/21.    Alcohol use Disorder  No evidence or concern for withdrawal on examination. He is on Naltrexone, which was held on admission for syncopal episode. CIWA without ativan.  - Resumed Naltrexone at discharge  COPD - Continued home Breo and albuterol as needed  Asymptomatic Pyuria  No symptoms of dysuria or difficulty with urination.  No indication for antibiotic management.   Left knee Effusion  Likely traumatic due to recent fall.  Patient is not endorsing any pain at this time.  Examination is reassuring with no erythema, edema or limited range of motion. Treated symptomatically and monitor daily   DISCHARGE INSTRUCTIONS:   Discharge Instructions     Call MD for:  difficulty breathing, headache or visual disturbances   Complete by: As directed    Call MD for:  extreme fatigue   Complete by: As directed    Call MD for:  hives   Complete by: As directed    Call MD for:  persistant dizziness or light-headedness   Complete by: As directed    Call MD for:  persistant nausea and vomiting   Complete by: As  directed    Call MD for:  redness, tenderness, or signs of infection (pain, swelling, redness, odor or green/yellow discharge around incision site)   Complete by: As directed    Call MD for:  severe uncontrolled pain   Complete by: As directed    Call MD for:  temperature >100.4   Complete by: As directed    Diet - low sodium heart healthy   Complete by: As directed    Increase activity slowly   Complete by: As directed    No wound care   Complete by: As directed        SUBJECTIVE:  No acute overnight events.    Patient was seen at bedside during rounds today.  Patient states he is ready for PO intake. He states he is doing well and has no acute complaints.    Patient counseled on his medications and confirmed via teach back method. He is updated on the plan for today, and all questions and concerns are addressed.   All questions were addressed with patient prior to being discharged.   Discharge Vitals:   BP 104/82 (BP Location: Right Arm)   Pulse 64   Temp (!) 97.5 F (36.4 C)   Resp 15   Ht 6\' 1"  (1.854 m)   Wt 84.5 kg   SpO2 93%   BMI 24.58 kg/m   OBJECTIVE:  Constitutional: alert, well-appearing, in NAD HENT: normocephalic, atraumatic, mucous membranes moist Cardiovascular: RRR, no m/r/g, non-edematous bilateral LE Pulmonary/Chest: normal work of breathing on room air, LCTAB Abdominal: soft, non-tender to palpation, non-distended MSK: normal bulk and tone Neurological: A&O x 3 and follows commands   Skin: warm and dry  Pertinent Labs, Studies, and Procedures:     Latest Ref Rng & Units 12/10/2021    2:34 AM 12/09/2021    3:40 AM 12/08/2021    1:27 AM  CBC  WBC 4.0 - 10.5 K/uL 5.6   5.0   8.6    Hemoglobin 13.0 - 17.0 g/dL 10.0   10.2   12.7    Hematocrit 39.0 - 52.0 % 28.6   29.8   36.1    Platelets 150 - 400 K/uL 194   180   224         Latest Ref Rng & Units 12/10/2021    2:34 AM 12/09/2021    3:40 AM 12/08/2021    1:27 AM  CMP  Glucose 70 - 99  mg/dL 92   104   102    BUN 8 - 23 mg/dL 14   13  14    Creatinine 0.61 - 1.24 mg/dL 1.09   1.32   1.30    Sodium 135 - 145 mmol/L 136   139   136    Potassium 3.5 - 5.1 mmol/L 3.4   4.3   4.0    Chloride 98 - 111 mmol/L 102   103   97    CO2 22 - 32 mmol/L 27   31   28     Calcium 8.9 - 10.3 mg/dL 9.1   10.3   12.0    Total Protein 6.5 - 8.1 g/dL 4.7   4.8   5.9    Total Bilirubin 0.3 - 1.2 mg/dL 0.6   0.8   0.9    Alkaline Phos 38 - 126 U/L 60   56   68    AST 15 - 41 U/L 19   23   35    ALT 0 - 44 U/L 14   13   19       CT Head Wo Contrast  Result Date: 12/07/2021 CLINICAL DATA:  Status post fall. EXAM: CT HEAD WITHOUT CONTRAST TECHNIQUE: Contiguous axial images were obtained from the base of the skull through the vertex without intravenous contrast. RADIATION DOSE REDUCTION: This exam was performed according to the departmental dose-optimization program which includes automated exposure control, adjustment of the mA and/or kV according to patient size and/or use of iterative reconstruction technique. COMPARISON:  November 02, 2021 FINDINGS: Brain: There is mild cerebral atrophy with widening of the extra-axial spaces and ventricular dilatation. There are areas of decreased attenuation within the white matter tracts of the supratentorial brain, consistent with microvascular disease changes. Vascular: No hyperdense vessel or unexpected calcification. Skull: Normal. Negative for fracture or focal lesion. Sinuses/Orbits: No acute finding. Other: None. IMPRESSION: 1. No acute intracranial abnormality. 2. Generalized cerebral atrophy with chronic white matter small vessel ischemic changes. Electronically Signed   By: Virgina Norfolk M.D.   On: 12/07/2021 22:22   CT Cervical Spine Wo Contrast  Result Date: 12/07/2021 CLINICAL DATA:  Status post fall. EXAM: CT CERVICAL SPINE WITHOUT CONTRAST TECHNIQUE: Multidetector CT imaging of the cervical spine was performed without intravenous contrast.  Multiplanar CT image reconstructions were also generated. RADIATION DOSE REDUCTION: This exam was performed according to the departmental dose-optimization program which includes automated exposure control, adjustment of the mA and/or kV according to patient size and/or use of iterative reconstruction technique. COMPARISON:  None Available. FINDINGS: Alignment: There is reversal of the normal cervical spine lordosis with 3.5 mm anterolisthesis of the C2 vertebral body on C3. Approximately 3 mm retrolisthesis of C4 is noted on C5. Skull base and vertebrae: No acute fracture. No primary bone lesion or focal pathologic process. Soft tissues and spinal canal: No prevertebral fluid or swelling. No visible canal hematoma. Disc levels: There is marked severity endplate sclerosis and anterior osteophyte formation at the levels of C3-C4, C4-C5, C5-C6 and C6-C7. Marked severity intervertebral disc space narrowing is also seen at these levels. Bilateral marked severity multilevel facet joint hypertrophy is noted. Upper chest: Negative. Other: None. IMPRESSION: 1. Marked severity multilevel degenerative changes without evidence of an acute fracture. Electronically Signed   By: Virgina Norfolk M.D.   On: 12/07/2021 22:25   DG Chest Portable 1 View  Result Date: 12/07/2021 CLINICAL DATA:  Fall, chest injury EXAM: PORTABLE CHEST 1 VIEW COMPARISON:  01/07/2021 FINDINGS: The lungs appear hyperinflated in keeping with changes of underlying COPD. The lungs are clear. No pneumothorax or  pleural effusion. Cardiac size within normal limits. Pulmonary vascularity is normal. No acute bone abnormality. Healed rib fractures are seen bilaterally. IMPRESSION: No radiographic evidence of acute cardiopulmonary disease. Electronically Signed   By: Fidela Salisbury M.D.   On: 12/07/2021 20:24      Lajean Manes, MD Internal Medicine Resident, PGY-1 Pager: [phone]

## 2021-12-10 NOTE — Anesthesia Preprocedure Evaluation (Signed)
Anesthesia Evaluation  Patient identified by MRN, date of birth, ID band Patient awake    Reviewed: Allergy & Precautions, H&P , NPO status , Patient's Chart, lab work & pertinent test results  Airway Mallampati: II   Neck ROM: full    Dental   Pulmonary COPD, Current Smoker and Patient abstained from smoking.,    breath sounds clear to auscultation       Cardiovascular hypertension, +CHF   Rhythm:regular Rate:Normal     Neuro/Psych    GI/Hepatic (+)     substance abuse  alcohol use,   Endo/Other    Renal/GU Renal disease     Musculoskeletal   Abdominal   Peds  Hematology  (+) Blood dyscrasia, anemia ,   Anesthesia Other Findings   Reproductive/Obstetrics                             Anesthesia Physical Anesthesia Plan  ASA: 3  Anesthesia Plan: MAC   Post-op Pain Management:    Induction: Intravenous  PONV Risk Score and Plan: 0 and Propofol infusion and Treatment may vary due to age or medical condition  Airway Management Planned: Nasal Cannula  Additional Equipment:   Intra-op Plan:   Post-operative Plan:   Informed Consent: I have reviewed the patients History and Physical, chart, labs and discussed the procedure including the risks, benefits and alternatives for the proposed anesthesia with the patient or authorized representative who has indicated his/her understanding and acceptance.     Dental advisory given  Plan Discussed with: CRNA and Anesthesiologist  Anesthesia Plan Comments:         Anesthesia Quick Evaluation

## 2021-12-10 NOTE — Op Note (Signed)
[facility] Patient Name: Luis Marquez Procedure Date : 12/10/2021 MRN: [id number] Attending MD: Mauri Pole , MD Date of Birth: 11/20/1960 CSN: [id number] Age: 61 Admit Type: Inpatient Procedure:                Upper GI endoscopy Indications:              Dysphagia, Abnormal CT of the GI tract Providers:                Mauri Pole, MD, Glori Bickers, RN,                            William Dalton, Technician Referring MD:              Medicines:                Monitored Anesthesia Care Complications:            No immediate complications. Estimated Blood Loss:     Estimated blood loss was minimal. Procedure:                Pre-Anesthesia Assessment:                           - Prior to the procedure, a History and Physical                            was performed, and patient medications and                            allergies were reviewed. The patient's tolerance of                            previous anesthesia was also reviewed. The risks                            and benefits of the procedure and the sedation                            options and risks were discussed with the patient.                            All questions were answered, and informed consent                            was obtained. Prior Anticoagulants: The patient                            last took Eliquis (apixaban) 1 day prior to the                            procedure. ASA Grade Assessment: IV - A patient                            with severe systemic disease that is a constant  threat to life. After reviewing the risks and                            benefits, the patient was deemed in satisfactory                            condition to undergo the procedure.                           After obtaining informed consent, the endoscope was                            passed under direct vision. Throughout the                            procedure,  the patient's blood pressure, pulse, and                            oxygen saturations were monitored continuously. The                            [id number] Olympus endoscope was introduced                            through the mouth, and advanced to the second part                            of duodenum. The upper GI endoscopy was                            accomplished without difficulty. The patient                            tolerated the procedure well. Scope In: Scope Out: Findings:      LA Grade D (one or more mucosal breaks involving at least 75% of       esophageal circumference) esophagitis with bleeding was found 26 to 40       cm from the incisors. Biopsies were taken with a cold forceps for       histology. To prevent bleeding after the biopsy, one hemostatic clip was       successfully placed (MR conditional). There was no bleeding at the end       of the procedure.      Patchy mild inflammation characterized by congestion (edema) and       erythema was found in the entire examined stomach. Biopsies were taken       with a cold forceps for Helicobacter pylori testing.      A 4 cm hiatal hernia was present.      Patchy mildly erythematous mucosa without active bleeding was found in       the duodenal bulb. Impression:               - LA Grade D esophagitis with bleeding. Biopsied.  Clip (MR conditional) was placed.                           - Gastritis. Biopsied.                           - 4 cm hiatal hernia.                           - Erythematous duodenopathy. Recommendation:           - Patient has a contact number available for                            emergencies. The signs and symptoms of potential                            delayed complications were discussed with the                            patient. Return to normal activities tomorrow.                            Written discharge instructions were provided to the                             patient.                           - Mechanical soft diet.                           - Continue present medications.                           - Follow an antireflux regimen.                           - Use Protonix (pantoprazole) 40 mg PO BID.                           - Use sucralfate suspension 1 gram PO QID for 2                            weeks.                           - Repeat upper endoscopy in 3 months to check                            healing and exclude Barrett's esophagus, dysplasia                            or malignancy.                           - Monitor Hgb daily and transfuse if below 7                           -  Resume Eliquis (apixaban) at prior dose tomorrow.                            Refer to managing physician for further adjustment                            of therapy.                           - Await pathology results. Procedure Code(s):        --- Professional ---                           [phone], Esophagogastroduodenoscopy, flexible,                            transoral; with biopsy, single or multiple Diagnosis Code(s):        --- Professional ---                           K20.91, Esophagitis, unspecified with bleeding                           K29.70, Gastritis, unspecified, without bleeding                           K44.9, Diaphragmatic hernia without obstruction or                            gangrene                           K31.89, Other diseases of stomach and duodenum                           R13.10, Dysphagia, unspecified                           R93.3, Abnormal findings on diagnostic imaging of                            other parts of digestive tract CPT copyright 2019 American Medical Association. All rights reserved. The codes documented in this report are preliminary and upon coder review may  be revised to meet current compliance requirements. Mauri Pole, MD 12/10/2021 8:43:21 AM This report has been signed  electronically. Number of Addenda: 0

## 2021-12-10 NOTE — Transfer of Care (Signed)
Immediate Anesthesia Transfer of Care Note  Patient: Luis Marquez  Procedure(s) Performed: ESOPHAGOGASTRODUODENOSCOPY (EGD) WITH PROPOFOL BIOPSY HEMOSTASIS CLIP PLACEMENT  Patient Location: PACU  Anesthesia Type:MAC  Level of Consciousness: awake, alert  and oriented  Airway & Oxygen Therapy: Patient Spontanous Breathing  Post-op Assessment: Report given to RN and Post -op Vital signs reviewed and stable  Post vital signs: Reviewed and stable  Last Vitals:  Vitals Value Taken Time  BP 110/66 12/10/21 0845  Temp    Pulse 63 12/10/21 0845  Resp 17 12/10/21 0845  SpO2 94 % 12/10/21 0845  Vitals shown include unvalidated device data.  Last Pain:  Vitals:   12/10/21 0758  TempSrc: Temporal  PainSc: 0-No pain      Patients Stated Pain Goal: 0 (61/44/31 5400)  Complications: No notable events documented.

## 2021-12-10 NOTE — Interval H&P Note (Signed)
History and Physical Interval Note:  12/10/2021 8:06 AM  [name]  has presented today for surgery, with the diagnosis of dysphagia, pyrosis and esophageal wall thickening on CT scan.  The various methods of treatment have been discussed with the patient and family. After consideration of risks, benefits and other options for treatment, the patient has consented to  Procedure(s): ESOPHAGOGASTRODUODENOSCOPY (EGD) WITH PROPOFOL (N/A) as a surgical intervention.  The patient's history has been reviewed, patient examined, no change in status, stable for surgery.  I have reviewed the patient's chart and labs.  Questions were answered to the patient's satisfaction.     Luis Marquez

## 2021-12-10 NOTE — Discharge Instructions (Addendum)
You were admitted because you passed out from low blood pressure. This was likely caused from dehydration and from your medications. Please continue to eat and drink to prevent further dehydration. We did and EGD this admission to also rule out any can cancer of the throat because of your high calcium. Please call [facility] with the phone number provided to schedule an appointment. Also follow up with your PCP in 1 week.   Medications: -Start taking Eliquis tomorrow, 12/11/21 -Please stop taking your medications that you have at home except the Eliquis  -Start taking the medications that have been provided to you from the hospital at time of discharge.

## 2021-12-10 NOTE — Anesthesia Procedure Notes (Signed)
Procedure Name: MAC Date/Time: 12/10/2021 8:11 AM Performed by: Carolan Clines, CRNA Pre-anesthesia Checklist: Patient identified, Emergency Drugs available, Suction available and Patient being monitored Patient Re-evaluated:Patient Re-evaluated prior to induction Oxygen Delivery Method: Nasal cannula Dental Injury: Teeth and Oropharynx as per pre-operative assessment

## 2021-12-10 NOTE — Hospital Course (Addendum)
Syncope 2/2 orthostatic hypotension  Single positional syncopal episode with no prodromal symptoms, no postictal state noted nor abnormal muscle movements while unconscious; no tongue biting or urinary incontinence noted. Overall, presentation most consistent with syncope 2/2 orthostatic hypotension given hypotensive on arrival, and positive orthostatics prior to IVF, in the setting of poor po intake, and on several medications that lower BP. Resolution of symptoms and AKI further support this. He is HDS, ambulating well, and had negative orthostatics after receiving fluids. No further syncopal work up required. Continued home metoprolol this admission, but continued to hold Delene Loll and Arlyce Harman until he follows up with cardiology on 12/20/21 (discussed with his provider).    Prerenal AKI  Resolved with IVF.  - Continue to hold Entresto and Arlyce Harman till cards f/u on 6/5  Non-Parathyroid Hypercalcemia  Corrected Ca improved from 12.9 -> 11.7 -> 11.2 with IVF. Previous workup with suppressed PTH, negative PTHrP, and thyroid and MM ruled out. PCA negative. CT chest notable for small nodular densities between 3-6 mm with recommendation to repeat CT chest in 3-6 months. CT A/P this admission with no acute findings, besides mild circumferential distal thickening. Never had an EGD. Given hx of tobacco and alcohol use, and new onset dyspepsia x 6 weeks, consulted GI for inpatient esophageal evaluation. EGD revealing Grade D esophagitis with bleeding. Biopsies were taken, and hemostatic clips placed. No bleeding at the end of the procedure. 4 cm hiatal hernia present. Patchy mild erythematous mucosa without active bleeding present in the duodenal bulb. Recommended to follow up with GI in the outpatient setting for repeat EGD in 3 months to check for healing, exclude Barrett's esophagus, dysplasia, or malignancy. Continue Protonix 40 mg BID and Sucralfate 1g QID for 14 days. Resume Eliquis 12/11/21. Ensure compliance and GI  follow up during hospital follow up.    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 Remained euvolemic. Continue to hold Entresto and spironolactone due to syncope, orthostatic hypotension (resolved), and AKI until he f/u with cards on 6/5; discussed with his cards provider. Metoprolol is continued.    Paroxysmal Atrial Fibrillation  Sinus rhythm at this time. - Continue home metoprolol  - Held Eliquis 5 mg BID for EGD but instructed to resume on 12/11/21.    Alcohol use Disorder  No evidence or concern for withdrawal on examination. He is on Naltrexone, which was held on admission for syncopal episode. CIWA without ativan.  - Resumed Naltrexone at discharge   COPD - Continued home Breo and albuterol as needed  Asymptomatic Pyuria  No symptoms of dysuria or difficulty with urination.  No indication for antibiotic management.   Left knee Effusion  Likely traumatic due to recent fall.  Patient is not endorsing any pain at this time.  Examination is reassuring with no erythema, edema or limited range of motion. Treated symptomatically and monitor daily

## 2021-12-14 ENCOUNTER — Encounter (HOSPITAL_COMMUNITY): Payer: Self-pay | Admitting: Gastroenterology

## 2021-12-14 ENCOUNTER — Telehealth: Payer: Self-pay

## 2021-12-14 ENCOUNTER — Other Ambulatory Visit (HOSPITAL_COMMUNITY): Payer: Self-pay

## 2021-12-14 LAB — SURGICAL PATHOLOGY

## 2021-12-14 NOTE — Telephone Encounter (Signed)
Transition Care Management Unsuccessful Follow-up Telephone Call  Date of discharge and from where:  12/10/2021, [facility]  Attempts:  1st Attempt  Reason for unsuccessful TCM follow-up call:  Left voice message on # [phone], call back requested.

## 2021-12-15 ENCOUNTER — Telehealth: Payer: Self-pay

## 2021-12-15 ENCOUNTER — Telehealth: Payer: Self-pay | Admitting: Physical Medicine and Rehabilitation

## 2021-12-15 NOTE — Telephone Encounter (Signed)
Transition Care Management Follow-up Telephone Call Date of discharge and from where: 12/10/2021, [facility] How have you been since you were released from the hospital? He said he is feeling all right Any questions or concerns? Yes- he said he has staples in the back of his head from a fall 12/07/2021 and needs to have staples removed at HFU appt.   Items Reviewed: Did the pt receive and understand the discharge instructions provided? Yes  Medications obtained and verified? Yes - he said he has all of his medications and he is aware that he is to stop taking the entresto and aldactone. He didn't have any questions about the med regime  Other? No  Any new allergies since your discharge? No  Dietary orders reviewed? Yes Do you have support at home? Yes   Home Care and Equipment/Supplies: Were home health services ordered? no If so, what is the name of the agency? N/a  Has the agency set up a time to come to the patient's home? not applicable Were any new equipment or medical supplies ordered?  No What is the name of the medical supply agency? N/a Were you able to get the supplies/equipment? not applicable Do you have any questions related to the use of the equipment or supplies? No  Functional Questionnaire: (I = Independent and D = Dependent) ADLs: independent.  Has cane and walker to use if needed.    Follow up appointments reviewed:  PCP Hospital f/u appt confirmed? Yes  Scheduled to see Dr Joya Gaskins- 6/5/023. He has an appointment with CHF clinic an hour prior to this appointment. He did not want to change the appointment with Dr Joya Gaskins and said he will re-schedule the appointment with CHF clinic. [facility] f/u appt confirmed? Yes  Scheduled to see CHF clinic- 12/20/2021 and GI- 01/31/2022  Are transportation arrangements needed? No  If their condition worsens, is the pt aware to call PCP or go to the Emergency Dept.? Yes Was the patient provided with contact information  for the PCP's office or ED? Yes Was to pt encouraged to call back with questions or concerns? Yes

## 2021-12-15 NOTE — Telephone Encounter (Signed)
Patient called. He would like an appointment with Dr. Newton.  

## 2021-12-19 NOTE — Progress Notes (Incomplete)
   Established Patient Office Visit  Subjective   Patient ID: Luis Marquez, male    DOB: 1961/05/03  Age: 61 y.o. MRN: [id number]  No chief complaint on file.   HPI  {History (Optional):23778}  ROS    Objective:     There were no vitals taken for this visit. {Vitals History (Optional):23777}  Physical Exam   No results found for any visits on 12/20/21.  {Labs (Optional):23779}  The ASCVD Risk score (Arnett DK, et al., 2019) failed to calculate for the following reasons:   The valid total cholesterol range is 130 to 320 mg/dL    Assessment & Plan:   Problem List Items Addressed This Visit   None   No follow-ups on file.    Shan Levans, MD

## 2021-12-20 ENCOUNTER — Encounter: Payer: Self-pay | Admitting: Critical Care Medicine

## 2021-12-20 ENCOUNTER — Encounter (HOSPITAL_COMMUNITY): Payer: Self-pay

## 2021-12-20 ENCOUNTER — Other Ambulatory Visit: Payer: Self-pay

## 2021-12-20 ENCOUNTER — Ambulatory Visit (HOSPITAL_COMMUNITY)
Admission: RE | Admit: 2021-12-20 | Discharge: 2021-12-20 | Disposition: A | Discharge status: Home / Self Care | Payer: Medicaid Other | Source: Ambulatory Visit | Attending: Family Medicine | Admitting: Family Medicine

## 2021-12-20 ENCOUNTER — Ambulatory Visit: Payer: Medicaid Other | Attending: Critical Care Medicine | Admitting: Critical Care Medicine

## 2021-12-20 VITALS — BP 142/86 | HR 102 | Wt 187.2 lb

## 2021-12-20 VITALS — BP 149/94 | HR 96 | Wt 187.2 lb

## 2021-12-20 DIAGNOSIS — Z720 Tobacco use: Secondary | ICD-10-CM | POA: Insufficient documentation

## 2021-12-20 DIAGNOSIS — I480 Paroxysmal atrial fibrillation: Secondary | ICD-10-CM

## 2021-12-20 DIAGNOSIS — J439 Emphysema, unspecified: Secondary | ICD-10-CM | POA: Diagnosis not present

## 2021-12-20 DIAGNOSIS — N179 Acute kidney failure, unspecified: Secondary | ICD-10-CM

## 2021-12-20 DIAGNOSIS — R55 Syncope and collapse: Secondary | ICD-10-CM

## 2021-12-20 DIAGNOSIS — I110 Hypertensive heart disease with heart failure: Secondary | ICD-10-CM | POA: Diagnosis not present

## 2021-12-20 DIAGNOSIS — I10 Essential (primary) hypertension: Secondary | ICD-10-CM | POA: Diagnosis not present

## 2021-12-20 DIAGNOSIS — F17210 Nicotine dependence, cigarettes, uncomplicated: Secondary | ICD-10-CM | POA: Diagnosis not present

## 2021-12-20 DIAGNOSIS — F1010 Alcohol abuse, uncomplicated: Secondary | ICD-10-CM | POA: Insufficient documentation

## 2021-12-20 DIAGNOSIS — I2510 Atherosclerotic heart disease of native coronary artery without angina pectoris: Secondary | ICD-10-CM | POA: Insufficient documentation

## 2021-12-20 DIAGNOSIS — S0101XD Laceration without foreign body of scalp, subsequent encounter: Secondary | ICD-10-CM

## 2021-12-20 DIAGNOSIS — I4891 Unspecified atrial fibrillation: Secondary | ICD-10-CM | POA: Diagnosis not present

## 2021-12-20 DIAGNOSIS — Z7901 Long term (current) use of anticoagulants: Secondary | ICD-10-CM | POA: Diagnosis not present

## 2021-12-20 DIAGNOSIS — Z79899 Other long term (current) drug therapy: Secondary | ICD-10-CM | POA: Insufficient documentation

## 2021-12-20 DIAGNOSIS — I5022 Chronic systolic (congestive) heart failure: Secondary | ICD-10-CM | POA: Diagnosis not present

## 2021-12-20 DIAGNOSIS — K209 Esophagitis, unspecified without bleeding: Secondary | ICD-10-CM

## 2021-12-20 DIAGNOSIS — I428 Other cardiomyopathies: Secondary | ICD-10-CM | POA: Insufficient documentation

## 2021-12-20 DIAGNOSIS — E876 Hypokalemia: Secondary | ICD-10-CM | POA: Diagnosis not present

## 2021-12-20 DIAGNOSIS — D649 Anemia, unspecified: Secondary | ICD-10-CM

## 2021-12-20 DIAGNOSIS — F17200 Nicotine dependence, unspecified, uncomplicated: Secondary | ICD-10-CM

## 2021-12-20 DIAGNOSIS — G629 Polyneuropathy, unspecified: Secondary | ICD-10-CM | POA: Diagnosis not present

## 2021-12-20 LAB — BASIC METABOLIC PANEL
Anion gap: 10 (ref 5–15)
BUN: <5 mg/dL — ABNORMAL LOW (ref 8–23)
CO2: 28 mmol/L (ref 22–32)
Calcium: 8.3 mg/dL — ABNORMAL LOW (ref 8.9–10.3)
Chloride: 101 mmol/L (ref 98–111)
Creatinine, Ser: 0.85 mg/dL (ref 0.61–1.24)
GFR, Estimated: >60 mL/min (ref >60)
Glucose, Bld: 164 mg/dL — ABNORMAL HIGH (ref 70–99)
Potassium: 3.3 mmol/L — ABNORMAL LOW (ref 3.5–5.1)
Sodium: 139 mmol/L (ref 135–145)

## 2021-12-20 LAB — BRAIN NATRIURETIC PEPTIDE: B Natriuretic Peptide: 595.9 pg/mL — ABNORMAL HIGH (ref 0.0–100.0)

## 2021-12-20 MED ORDER — APIXABAN 5 MG PO TABS
5.0000 mg | ORAL_TABLET | Freq: Two times a day (BID) | ORAL | 0 refills | Status: DC
  Filled 2021-12-20: qty 60, 30d supply, fill #0

## 2021-12-20 MED ORDER — LOSARTAN POTASSIUM 25 MG PO TABS
12.5000 mg | ORAL_TABLET | Freq: Every day | ORAL | 1 refills | Status: DC
  Filled 2021-12-20: qty 15, 30d supply, fill #0

## 2021-12-20 MED ORDER — PANTOPRAZOLE SODIUM 40 MG PO TBEC
40.0000 mg | DELAYED_RELEASE_TABLET | Freq: Two times a day (BID) | ORAL | 0 refills | Status: DC
  Filled 2021-12-20: qty 60, 30d supply, fill #0

## 2021-12-20 MED ORDER — METOPROLOL SUCCINATE ER 25 MG PO TB24
25.0000 mg | ORAL_TABLET | Freq: Every day | ORAL | 0 refills | Status: DC
  Filled 2021-12-20: qty 30, 30d supply, fill #0

## 2021-12-20 MED ORDER — FERROUS SULFATE 325 (65 FE) MG PO TABS
325.0000 mg | ORAL_TABLET | Freq: Two times a day (BID) | ORAL | 3 refills | Status: DC
  Filled 2021-12-20: qty 60, 30d supply, fill #0

## 2021-12-20 MED ORDER — ALBUTEROL SULFATE HFA 108 (90 BASE) MCG/ACT IN AERS
2.0000 | INHALATION_SPRAY | Freq: Four times a day (QID) | RESPIRATORY_TRACT | 0 refills | Status: DC | PRN
  Filled 2021-12-20: qty 8.5, 25d supply, fill #0

## 2021-12-20 MED ORDER — FOLIC ACID 1 MG PO TABS
1.0000 mg | ORAL_TABLET | Freq: Every day | ORAL | 0 refills | Status: DC
  Filled 2021-12-20: qty 30, 30d supply, fill #0

## 2021-12-20 MED ORDER — MAGNESIUM OXIDE 400 MG PO TABS
400.0000 mg | ORAL_TABLET | Freq: Every day | ORAL | 0 refills | Status: AC
  Filled 2021-12-20: qty 30, 30d supply, fill #0

## 2021-12-20 MED ORDER — AMIODARONE HCL 200 MG PO TABS
100.0000 mg | ORAL_TABLET | Freq: Every day | ORAL | 3 refills | Status: DC
  Filled 2021-12-20: qty 30, 60d supply, fill #0

## 2021-12-20 MED ORDER — GABAPENTIN 300 MG PO CAPS
300.0000 mg | ORAL_CAPSULE | Freq: Every day | ORAL | 0 refills | Status: DC
  Filled 2021-12-20: qty 30, 30d supply, fill #0

## 2021-12-20 MED ORDER — FLUTICASONE FUROATE-VILANTEROL 100-25 MCG/ACT IN AEPB
1.0000 | INHALATION_SPRAY | Freq: Every day | RESPIRATORY_TRACT | 1 refills | Status: DC
  Filled 2021-12-20: qty 30, 30d supply, fill #0
  Filled 2022-08-16: qty 60, 30d supply, fill #0

## 2021-12-20 MED ORDER — SUCRALFATE 1 G PO TABS
1.0000 g | ORAL_TABLET | Freq: Three times a day (TID) | ORAL | 0 refills | Status: DC
  Filled 2021-12-20: qty 56, 14d supply, fill #0

## 2021-12-20 NOTE — Assessment & Plan Note (Signed)
Blood pressure back to normal range letting him run a little high now still off Entresto we will resume the low-dose losartan per cardiology

## 2021-12-20 NOTE — Assessment & Plan Note (Signed)
This has healed remove 3 staples no Steri-Strips needed

## 2021-12-20 NOTE — Assessment & Plan Note (Signed)
Follow-up per primary care. 

## 2021-12-20 NOTE — Assessment & Plan Note (Signed)
Follow-up metabolic panel 

## 2021-12-20 NOTE — Patient Instructions (Signed)
Thank you for coming in today  Labs were done today, if any labs are abnormal the clinic will call you No news is good news  START Losartan 12.5 mg daily 1/2 tablet  DECREASE Amiodarone to 100 mg 1/2 tablet daily   Your physician recommends that you schedule a follow-up appointment in:  2-3 weeks with Pharmacy we will call you to make appointment per your request  Keep follow up with Dr. Gala Romney  At [facility], you and your health needs are our priority. As part of our continuing mission to provide you with exceptional heart care, we have created designated Provider Care Teams. These Care Teams include your primary Cardiologist (physician) and Advanced Practice Providers (APPs- Physician Assistants and Nurse Practitioners) who all work together to provide you with the care you need, when you need it.   You may see any of the following providers on your designated Care Team at your next follow up: Dr Arvilla Meres Dr Carron Curie, NP Robbie Lis, [place] [facility], [place] Karle Plumber, PharmD   Please be sure to bring in all your medications bottles to every appointment.   If you have any questions or concerns before your next appointment please send [place] a message through [facility] or call our office at [phone].    TO LEAVE A MESSAGE FOR THE NURSE SELECT OPTION 2, PLEASE LEAVE A MESSAGE INCLUDING: YOUR NAME DATE OF BIRTH CALL BACK NUMBER REASON FOR CALL**this is important as we prioritize the call backs  YOU WILL RECEIVE A CALL BACK THE SAME DAY AS LONG AS YOU CALL BEFORE 4:00 PM

## 2021-12-20 NOTE — Assessment & Plan Note (Signed)
As per cardiology recommendation

## 2021-12-20 NOTE — Assessment & Plan Note (Signed)
Rate controlled currently continue Toprol and Eliquis

## 2021-12-20 NOTE — Patient Instructions (Signed)
Labs today include blood counts  Referral to gastroenterology was made to follow-up on your esophagitis  Refills on your medications sent to our pharmacy, stay on the pantoprazole and Carafate to see gastroenterology  Focus on reducing smoking and alcohol use  Keep your upcoming appointments with cardiology and also keep your appointment with  Bertram Denver in August

## 2021-12-20 NOTE — Assessment & Plan Note (Signed)
Acute esophagitis with negative biopsies for viral inclusions ulceration seen no malignancy seen also stomach biopsied was negative for H. Pylori  Continue Carafate and Protonix and will get follow-up with gastroenterology arranged

## 2021-12-20 NOTE — Assessment & Plan Note (Signed)
Continue Breo and albuterol 

## 2021-12-20 NOTE — Assessment & Plan Note (Signed)
Follow-up renal function 

## 2021-12-20 NOTE — Progress Notes (Signed)
Established Patient Office Visit  Subjective   Patient ID: Luis Marquez, male    DOB: 07-15-1961  Age: 61 y.o. MRN: [id number]  Chief Complaint  Patient presents with   Hospitalization Follow-up   Suture / Staple Removal    HPI This patient is seen today for post hospital follow-up transition of care visit and staple removal.  Patient with a history of syncope he had been given Entresto by cardiology and had Takotsubo's cardiomyopathy.  Also had paroxysmal atrial fibrillation.  Also significant alcohol use disorder.  He had an episode of syncope likely due to hypotension due to poor p.o. intake and use of Entresto.  Below is a copy of the discharge summary.     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 Syncope was most likely due to hypotension/orthostatic hypotension - he had not been taking good PO, he was on several meds that lower blood pressure, he was hypotensive initially, had orthostatic hypotension after partial fluid resuscitation, and neuro/cardiac workup was unrevealing.   We stopped his Entresto & Spironolactone, and continued his BB. At time of discharge, he was no longer orthostatic.   He had an AKI, likely pre-renal, that resolved with IVF.   He has newly diagnosed hypercalcemia and was also hypercalcemic on arrival, this improved to Ca 11.2 corrected after IV hydration. He has now had CT chest/abdomen/pelvis to evaluate for potential malignancy. CT A/P showed esophageal thickening, so GI did EGD here, which showed gastritis, so we started PPI.   THINGS TO FOLLOW UP Med Changes: Stop Entresto & Spironolactone until Cardiology tells patient to restart them New Meds: PPI & Sucralfate Patient should follow up with Cardiology & GI  Patient still needs the complete workup for his Hypercalcemia, consider outpatient PET             Name: Luis Marquez MRN: [id number] DOB: February 13, 1961 61 y.o. PCP: Luis Pounds, NP   Date of Admission: 12/07/2021  7:57 PM Date of Discharge:  12/10/21 Attending Physician: Dr. Cain Marquez   DISCHARGE DIAGNOSIS:  Primary Problem: Syncope 2/2 orthostatic hypotension, Hypercalcemia    Hospital Problems: Principal Problem:   Syncope Active Problems:   Hypercalcemia   Hypokalemia   Emphysema lung (HCC)   Essential hypertension   AKI (acute kidney injury) ([place])   Chronic systolic CHF (congestive heart failure) (HCC)   Alcohol abuse   Paroxysmal atrial fibrillation (HCC)   Esophageal dysphagia   Acute esophagitis   Abnormal CT scan, esophagus         DISPOSITION AND FOLLOW-UP:  Luis Marquez was discharged from [facility] in stable condition. At the hospital follow up visit please address:   Follow-up Recommendations: Consults: Ensure [facility] GI follow up  Labs: CBC, CMP Studies: Follow up esophageal surgical pathology  Medications: Holding Spironolactone and Entresto until cardiology follow. Instructed to resume Eliquis on 12/11/21. Will need iron and B12 supplementation at hospital follow up     Follow-up Appointments:      HOSPITAL COURSE:  Patient Summary: Syncope 2/2 orthostatic hypotension  Single positional syncopal episode with no prodromal symptoms, no postictal state noted nor abnormal muscle movements while unconscious; no tongue biting or urinary incontinence noted. Overall, presentation most consistent with syncope 2/2 orthostatic hypotension given hypotensive on arrival, and positive orthostatics prior to IVF, in the setting of poor po intake, and on several medications that lower BP.  Resolution of symptoms and AKI further support this. He is HDS, ambulating well, and had negative orthostatics after receiving fluids. No further syncopal work up required. Continued home metoprolol this admission, but continued to hold Luis Marquez and Luis Marquez until he follows up with  cardiology on 12/20/21 (discussed with his provider).    Prerenal AKI  Resolved with IVF.  - Continue to hold Entresto and Luis Marquez till cards f/u on 6/5   Non-Parathyroid Hypercalcemia  Corrected Ca improved from 12.9 -> 11.7 -> 11.2 with IVF. Previous workup with suppressed PTH, negative PTHrP, and thyroid and MM ruled out. PCA negative. CT chest notable for small nodular densities between 3-6 mm with recommendation to repeat CT chest in 3-6 months. CT A/P this admission with no acute findings, besides mild circumferential distal thickening. Never had an EGD. Given hx of tobacco and alcohol use, and new onset dyspepsia x 6 weeks, consulted GI for inpatient esophageal evaluation. EGD revealing Grade D esophagitis with bleeding. Biopsies were taken, and hemostatic clips placed. No bleeding at the end of the procedure. 4 cm hiatal hernia present. Patchy mild erythematous mucosa without active bleeding present in the duodenal bulb. Recommended to follow up with GI in the outpatient setting for repeat EGD in 3 months to check for healing, exclude Barrett's esophagus, dysplasia, or malignancy. Continue Protonix 40 mg BID and Sucralfate 1g QID for 14 days. Resume Eliquis 12/11/21. Ensure compliance and GI follow up during hospital follow up.    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 Remained euvolemic. Continue to hold Entresto and spironolactone due to syncope, orthostatic hypotension (resolved), and AKI until he f/u with cards on 6/5; discussed with his cards provider. Metoprolol is continued.    Paroxysmal Atrial Fibrillation  Sinus rhythm at this time. - Continue home metoprolol  - Held Eliquis 5 mg BID for EGD but instructed to resume on 12/11/21.    Alcohol use Disorder  No evidence or concern for withdrawal on examination. He is on Naltrexone, which was held on admission for syncopal episode. CIWA without ativan.  - Resumed Naltrexone at discharge   COPD - Continued home Breo and  albuterol as needed   Asymptomatic Pyuria  No symptoms of dysuria or difficulty with urination.  No indication for antibiotic management.   Left knee Effusion  Likely traumatic due to recent fall.  Patient is not endorsing any pain at this time.  Examination is reassuring with no erythema, edema or limited range of motion. Treated symptomatically and monitor daily        Note cardiology just saw the patient earlier today and made specific recommendations as below ASSESSMENT & PLAN: 1.  Chronic Systolic Heart Failure: - New reduced EF. Unclear etiology. No previous history of coronary disease.  - Echo (5/23): EF 25%, mid and apical hypokinesis with sparing of base, RV mildly reduced, concerning for Takotsubo.  - R/LHC (5/23): minimal CAD, normal filling pressures.  - NICM. ETOH abuse/HTN maybe contributing.  - cMRI (5/23): LVEF improving 42%, evidence of potential Takotsubo CM, RV normal . - NYHA II. Volume status stable.  - Recent admit with AKI/orthostasis. MRA/ARNI stopped. - Would avoid SGLT2i for now that he is back drinking ETOH. - Start losartan 12.5 mg daily. - Continue Toprol XL 25 mg daily. - Continue Lasix 20 PRN. - Plan to repeat echo in 2-3 months with GDMT titrated.  - BMET, BNP today. Repeat BMET at next visit.   2. A Fib  - Developed A fib RVR 4/23 admit.  -  NSR on ECG today. - Continue Eliquis 5 mg bid. No bleeding issues. - Decrease amiodarone to 100 mg daily.   3. H/o Hypercalcemia  - Calcium on admit 14.5>> 7.3  - Primary planning PET scan to further work up.   4. H/o Hypomagnesia/hypokalemia - Labs today.   5. ETOH abuse - In Rehab 10 years ago for ETOH abuse.  - Previously drank 20 beers every 2 days. - Back drinking, but not as heavy. Discussed cutting down, ideally quitting. - He has been in touch with his sponsor.   6. Tobacco Abuse - Heavy smoker for many years.  - Discussed smoking cessation.    Follow up in 2-3 weeks with PharmD (add back  low-dose spiro if able, he will need a BMET at this visit) and 2 months with Dr. Haroldine Laws + echo.   Allena Katz, FNP-BC 12/20/21      The patient was requested to hold off on Entresto as of yet however low-dose losartan was to be resumed 25 mg daily he is to continue his amiodarone and Toprol  Patient has follow-up in 2 months with Dr. Haroldine Laws for the echocardiogram he had labs already obtained including metabolic panel and brain natruretic peptide  Patient had esophagitis during the hospitalization and had endoscopy he needs follow-up on this as well with gastroenterology and as well does need repeat CBC   Patient Active Problem List   Diagnosis Date Noted   Tobacco use 12/20/2021   Esophageal dysphagia    Acute esophagitis    Alcohol abuse 12/08/2021   Paroxysmal atrial fibrillation ([place]) 123456   Chronic systolic CHF (congestive heart failure) ([place]) 11/22/2021   Acute HFrEF (heart failure with reduced ejection fraction) ([place]) 11/04/2021   Hypokalemia 11/03/2021   Solitary pulmonary nodule 11/03/2021   Emphysema lung ([place]) 11/03/2021   Essential hypertension 11/03/2021   AKI (acute kidney injury) ([place])    Hypercalcemia 11/02/2021   Anemia 11/11/2014   Past Medical History:  Diagnosis Date   Delirium tremens ([place]) 11/11/2014   Fracture, intertrochanteric, right femur ([place]) 11/11/2014   Hypertension    Laceration of head 11/03/2021   Syncope 12/07/2021   Past Surgical History:  Procedure Laterality Date   BIOPSY  12/10/2021   Procedure: BIOPSY;  Surgeon: Mauri Pole, MD;  Location: [facility];  Service: Gastroenterology;;   ESOPHAGOGASTRODUODENOSCOPY (EGD) WITH PROPOFOL N/A 12/10/2021   Procedure: ESOPHAGOGASTRODUODENOSCOPY (EGD) WITH PROPOFOL;  Surgeon: Mauri Pole, MD;  Location: [facility];  Service: Gastroenterology;  Laterality: N/A;   HEMOSTASIS CLIP PLACEMENT  12/10/2021   Procedure: HEMOSTASIS CLIP PLACEMENT;  Surgeon: Mauri Pole,  MD;  Location: [facility];  Service: Gastroenterology;;   HERNIA REPAIR     INTRAMEDULLARY (IM) NAIL INTERTROCHANTERIC Right 11/11/2014   Procedure: INTRAMEDULLARY (IM) NAIL INTERTROCHANTRIC;  Surgeon: Paralee Cancel, MD;  Location: [facility];  Service: Orthopedics;  Laterality: Right;   RIGHT/LEFT HEART CATH AND CORONARY ANGIOGRAPHY N/A 11/11/2021   Procedure: RIGHT/LEFT HEART CATH AND CORONARY ANGIOGRAPHY;  Surgeon: Jolaine Artist, MD;  Location: [facility];  Service: Cardiovascular;  Laterality: N/A;   Rod right leg     Social History   Tobacco Use   Smoking status: Some Days    Packs/day: 0.25    Types: Cigarettes    Passive exposure: Never   Smokeless tobacco: Never  Vaping Use   Vaping Use: Never used  Substance Use Topics   Alcohol use: Not Currently   Drug use: Not Currently   Social  History   Socioeconomic History   Marital status: Single    Spouse name: Not on file   Number of children: Not on file   Years of education: Not on file   Highest education level: Not on file  Occupational History   Not on file  Tobacco Use   Smoking status: Some Days    Packs/day: 0.25    Types: Cigarettes    Passive exposure: Never   Smokeless tobacco: Never  Vaping Use   Vaping Use: Never used  Substance and Sexual Activity   Alcohol use: Not Currently   Drug use: Not Currently   Sexual activity: Yes  Other Topics Concern   Not on file  Social History Narrative   ** Merged History Encounter **       Social Determinants of Health   Financial Resource Strain: Not on file  Food Insecurity: Not on file  Transportation Needs: Not on file  Physical Activity: Not on file  Stress: Not on file  Social Connections: Not on file  Intimate Partner Violence: Not on file   Family Status  Relation Name Status   Mother  (Not Specified)   Family History  Problem Relation Age of Onset   Diabetes Mother    No Known Allergies    ROS    Objective:     BP (!)  149/94   Pulse 96   Wt 187 lb 3.2 oz (84.9 kg)   SpO2 95%   BMI 24.70 kg/m  BP Readings from Last 3 Encounters:  12/20/21 (!) 149/94  12/20/21 (!) 142/86  12/10/21 104/82   Wt Readings from Last 3 Encounters:  12/20/21 187 lb 3.2 oz (84.9 kg)  12/20/21 187 lb 3.2 oz (84.9 kg)  12/10/21 186 lb 4.6 oz (84.5 kg)      Physical Exam   Results for orders placed or performed during the hospital encounter of [id number]  Basic Metabolic Panel (BMET)  Result Value Ref Range   Sodium 139 135 - 145 mmol/L   Potassium 3.3 (L) 3.5 - 5.1 mmol/L   Chloride 101 98 - 111 mmol/L   CO2 28 22 - 32 mmol/L   Glucose, Bld 164 (H) 70 - 99 mg/dL   BUN <5 (L) 8 - 23 mg/dL   Creatinine, Ser 0.85 0.61 - 1.24 mg/dL   Calcium 8.3 (L) 8.9 - 10.3 mg/dL   GFR, Estimated >60 >60 mL/min   Anion gap 10 5 - 15  B Nat Peptide  Result Value Ref Range   B Natriuretic Peptide 595.9 (H) 0.0 - 100.0 pg/mL    Last CBC Lab Results  Component Value Date   WBC 5.6 12/10/2021   HGB 10.0 (L) 12/10/2021   HCT 28.6 (L) 12/10/2021   MCV 99.3 12/10/2021   MCH 34.7 (H) 12/10/2021   RDW 13.6 12/10/2021   PLT 194 0000000   Last metabolic panel Lab Results  Component Value Date   GLUCOSE 164 (H) 12/20/2021   NA 139 12/20/2021   K 3.3 (L) 12/20/2021   CL 101 12/20/2021   CO2 28 12/20/2021   BUN <5 (L) 12/20/2021   CREATININE 0.85 12/20/2021   GFRNONAA >60 12/20/2021   CALCIUM 8.3 (L) 12/20/2021   PHOS 2.9 11/15/2021   PROT 4.7 (L) 12/10/2021   ALBUMIN 2.1 (L) 12/10/2021   LABGLOB 2.7 11/02/2021   AGRATIO 1.0 11/02/2021   BILITOT 0.6 12/10/2021   ALKPHOS 60 12/10/2021   AST 19 12/10/2021   ALT 14 12/10/2021  ANIONGAP 10 12/20/2021   Last lipids Lab Results  Component Value Date   CHOL 120 11/06/2021   HDL 39 (L) 11/06/2021   LDLCALC 60 11/06/2021   TRIG 107 11/06/2021   CHOLHDL 3.1 11/06/2021   Last hemoglobin A1c Lab Results  Component Value Date   HGBA1C 5.2 11/06/2021   Last thyroid  functions Lab Results  Component Value Date   TSH 0.527 11/03/2021   Last vitamin D Lab Results  Component Value Date   VD25OH 65.73 11/03/2021   Last vitamin B12 and Folate Lab Results  Component Value Date   VITAMINB12 193 12/09/2021   FOLATE 25.8 12/09/2021      The ASCVD Risk score (Arnett DK, et al., 2019) failed to calculate for the following reasons:   The valid total cholesterol range is 130 to 320 mg/dL    Assessment & Plan:   Problem List Items Addressed This Visit       Cardiovascular and Mediastinum   Essential hypertension    Blood pressure back to normal range letting him run a little high now still off Entresto we will resume the low-dose losartan per cardiology       Relevant Medications   apixaban (ELIQUIS) 5 MG TABS tablet   metoprolol succinate (TOPROL-XL) 25 MG 24 hr tablet   Chronic systolic CHF (congestive heart failure) (HCC)    As per cardiology recommendation       Relevant Medications   apixaban (ELIQUIS) 5 MG TABS tablet   metoprolol succinate (TOPROL-XL) 25 MG 24 hr tablet   Paroxysmal atrial fibrillation (HCC)    Rate controlled currently continue Toprol and Eliquis       Relevant Medications   apixaban (ELIQUIS) 5 MG TABS tablet   metoprolol succinate (TOPROL-XL) 25 MG 24 hr tablet     Respiratory   Emphysema lung (HCC)    Continue Breo and albuterol       Relevant Medications   fluticasone furoate-vilanterol (BREO ELLIPTA) 100-25 MCG/ACT AEPB   albuterol (VENTOLIN HFA) 108 (90 Base) MCG/ACT inhaler     Digestive   Acute esophagitis - Primary    Acute esophagitis with negative biopsies for viral inclusions ulceration seen no malignancy seen also stomach biopsied was negative for H. Pylori  Continue Carafate and Protonix and will get follow-up with gastroenterology arranged       Relevant Orders   CBC with Differential/Platelet   Ambulatory referral to Gastroenterology     Genitourinary   AKI (acute kidney  injury) ([place])    Follow-up renal function         Other   Anemia    Follow-up CBC       Relevant Medications   ferrous sulfate 325 (65 FE) MG tablet   folic acid (FOLVITE) 1 MG tablet   Hypercalcemia    Follow-up per primary care       Hypokalemia    Follow-up metabolic panel       Alcohol abuse    Counseled to reduce alcohol further       Tobacco use       Current smoking consumption amount: 3 cigarettes a day  Dicsussion on advise to quit smoking and smoking impacts: Cardiovascular impacts  Patient's willingness to quit: Not ready to quit  Methods to quit smoking discussed: Behavioral modification  Medication management of smoking session drugs discussed: Not indicated  Resources provided:  AVS   Setting quit date not established  Follow-up arranged 2 months   Time spent  counseling the patient: 5 minutes       RESOLVED: Laceration of head    This has healed remove 3 staples no Steri-Strips needed       Relevant Orders   Suture removal   RESOLVED: Syncope    Resolved likely due to volume depletion from alcohol use and medication effect       Other Visit Diagnoses     Neuropathy       Relevant Medications   gabapentin (NEURONTIN) 300 MG capsule   Tobacco dependence       Relevant Medications   albuterol (VENTOLIN HFA) 108 (90 Base) MCG/ACT inhaler       Return for keep august appt with Raul Del.    Asencion Noble, MD

## 2021-12-20 NOTE — Assessment & Plan Note (Signed)
Follow-up CBC ?

## 2021-12-20 NOTE — Assessment & Plan Note (Signed)
Counseled to reduce alcohol further

## 2021-12-20 NOTE — Assessment & Plan Note (Signed)
  .   Current smoking consumption amount: 3 cigarettes a day  . Dicsussion on advise to quit smoking and smoking impacts: Cardiovascular impacts  . Patient's willingness to quit: Not ready to quit  . Methods to quit smoking discussed: Behavioral modification  . Medication management of smoking session drugs discussed: Not indicated  . Resources provided:  AVS   . Setting quit date not established  . Follow-up arranged 2 months   Time spent counseling the patient: 5 minutes

## 2021-12-20 NOTE — Progress Notes (Signed)
[facility]INIC  NOTE  Primary Care: Gildardo Pounds, NP HF Cardiologist: Dr. Haroldine Laws  HPI: Luis [facility]quez is a 61 y.o. with HTN, emphysema, ETOH abuse, tobacco abuse, and new diagnosis of systolic HF.    Admitted 5/23 after a fall (? Syncope) and found to have elevated calcium. Treated with IV fluids and IV zometa. Found to be in AF with RVR, started on diltiazem drop. Echo showed EF 20-25% RV mildly reduced. Diltiazem stopped and switched to amiodarone, eventual conversion to NSR. Echo reviewed by Dr Haroldine Laws, EF 25%.with possible Takot[place]bo. Underwent R/LHC showing minimal CAD, normal filling pressures. cMRI showed improving LVEF 42%, evidence of potential Tako-tsubo CM, RV normal. Amio weaned off and GDMT titrated. Hospitalization c/b ETOH withdrawal/delirium and hypercalcemia. He was discharged home, weight 197 lbs.  Post hospital follow up 5/23, NYHA II, on the dry side volume wise. Lasix changed to PRN.  Admitted 5/23 with syncope, felt 2/2 hypotension and orthostasis. Ethanol level 116. Arlyce Harman and [place] held, and received IVF. With h/o ETOH use, unintentional weight loss, hypercalcemia, and esophageal thickening on CT, GI was consulted. He underwent EGD, showing gastritis. Started on PPI and discharged home, weight 186 lbs.  Today he returns for HF follow up. Overall feeling fine. He has SOB walking up inclines or further distances on flat ground. Limited mainly by right hip OA, getting steroid shot soon. Denies palpitations, CP, dizziness, edema, or PND/Orthopnea. Appetite ok. No fever or chills. Weight at home 185 pounds. Taking all medications, but missed a couple days recently. Drank 4 beers this weekend.   Cardiac Studies:  - Echo (5/23): EF 25%, mid and apical hypokinesis with sparing of base, RV mildly reduced. Echo concerning for Takotsubo  - cMRI (5/23): LVEF 42%  evidence of potential Tako-tsubo CM  RV normal   - R/LHC  (5/23):   Prox Cx lesion is 20% stenosed.    Mid LAD lesion is 30% stenosed.   The left ventricular ejection fraction is 25-35% by visual estimate.    Ao = 119/68 (90) LV = 119/17 RA =  3 RV = 27/6 PA = 25/9 (15) PCW = 10 Fick cardiac output/index = 8.2/3.7 PVR = 0.6 WU Ao sat = 94% PA sat = 72%, 73% SVC sat = 74%    Past Medical History:  Diagnosis Date   Hypertension    Current Outpatient Medications  Medication Sig Dispense Refill   acetaminophen (TYLENOL) 325 MG tablet Take 2 tablets (650 mg total) by mouth every 6 (six) hours as needed for mild pain (or Fever >/= 101). 30 tablet 0   albuterol (VENTOLIN HFA) 108 (90 Base) MCG/ACT inhaler Inhale 2 puffs into the lungs every 6 (six) hours as needed for wheezing or shortness of breath. 8.5 g 0   amiodarone (PACERONE) 200 MG tablet Take 1 tablet (200 mg total) by mouth daily. 30 tablet 0   apixaban (ELIQUIS) 5 MG TABS tablet Take 1 tablet (5 mg total) by mouth 2 (two) times daily. 60 tablet 0   ferrous sulfate 325 (65 FE) MG tablet Take 1 tablet (325 mg total) by mouth 2 (two) times daily with a meal. 180 tablet 3   fluticasone furoate-vilanterol (BREO ELLIPTA) 100-25 MCG/ACT AEPB Inhale 1 puff into the lungs daily. 30 each 0   folic acid (FOLVITE) 1 MG tablet Take 1 tablet (1 mg total) by mouth daily. 30 tablet 0   furosemide (LASIX) 20 MG tablet Take 1 tablet (20 mg total) by mouth daily  as needed for edema or fluid. For weight gain of 3 lbs in 24 hours or 5 lbs in a week 30 tablet 0   gabapentin (NEURONTIN) 300 MG capsule Take 1 capsule (300 mg total) by mouth at bedtime. 30 capsule 0   magnesium oxide (MAG-OX) 400 MG tablet Take 1 tablet (400 mg total) by mouth daily. 30 tablet 0   metoprolol succinate (TOPROL-XL) 25 MG 24 hr tablet Take 1 tablet (25 mg total) by mouth daily. 30 tablet 0   Multiple Vitamin (MULTIVITAMIN WITH MINERALS) TABS tablet Take 1 tablet by mouth daily.     naltrexone (DEPADE) 50 MG tablet Take 1 tablet (50 mg total) by mouth daily. 30 tablet 0    pantoprazole (PROTONIX) 40 MG tablet Take 1 tablet (40 mg total) by mouth 2 (two) times daily. 60 tablet 0   sucralfate (CARAFATE) 1 g tablet Take 1 tablet (1 g total) by mouth 4 (four) times daily -  with meals and at bedtime for 14 days. 56 tablet 0   No current facility-administered medications for this encounter.    No Known Allergies    Social History   Socioeconomic History   Marital status: Single    Spouse name: Not on file   Number of children: Not on file   Years of education: Not on file   Highest education level: Not on file  Occupational History   Not on file  Tobacco Use   Smoking status: Some Days    Packs/day: 0.25    Types: Cigarettes    Passive exposure: Never   Smokeless tobacco: Never  Vaping Use   Vaping Use: Never used  Substance and Sexual Activity   Alcohol use: Not Currently   Drug use: Not Currently   Sexual activity: Yes  Other Topics Concern   Not on file  Social History Narrative   ** Merged History Encounter **       Social Determinants of Health   Financial Resource Strain: Not on file  Food Insecurity: Not on file  Transportation Needs: Not on file  Physical Activity: Not on file  Stress: Not on file  Social Connections: Not on file  Intimate Partner Violence: Not on file   Family History  Problem Relation Age of Onset   Diabetes Mother    BP (!) 142/86   Pulse (!) 102   Wt 84.9 kg (187 lb 3.2 oz)   SpO2 94%   BMI 24.70 kg/m   Wt Readings from Last 3 Encounters:  12/20/21 84.9 kg (187 lb 3.2 oz)  12/10/21 84.5 kg (186 lb 4.6 oz)  12/07/21 83.8 kg (184 lb 12.8 oz)   PHYSICAL EXAM: General:  NAD. No resp difficulty, walked into clinic with cane. HEENT: edentulous Neck: Supple. No JVD. Carotids 2+ bilat; no bruits. No lymphadenopathy or thryomegaly appreciated. Cor: PMI nondisplaced. Regular rate & rhythm. No rubs, gallops or murmurs. Lungs: Clear Abdomen: Soft, nontender, nondistended. No hepatosplenomegaly. No bruits  or masses. Good bowel sounds. Extremities: No cyanosis, clubbing, rash, edema Neuro: Alert & oriented x 3, cranial nerves grossly intact. Moves all 4 extremities w/o difficulty. Affect pleasant.  ECG: NSR rBBB 92 bpm  (personally reviewed)  ASSESSMENT & PLAN: 1.  Chronic Systolic Heart Failure: - New reduced EF. Unclear etiology. No previous history of coronary disease.  - Echo (5/23): EF 25%, mid and apical hypokinesis with sparing of base, RV mildly reduced, concerning for Takotsubo.  - R/LHC (5/23): minimal CAD, normal filling pressures.  -  NICM. ETOH abuse/HTN maybe contributing.  - cMRI (5/23): LVEF improving 42%, evidence of potential Takotsubo CM, RV normal . - NYHA II. Volume status stable.  - Recent admit with AKI/orthostasis. MRA/ARNI stopped. - Would avoid SGLT2i for now that he is back drinking ETOH. - Start losartan 12.5 mg daily. - Continue Toprol XL 25 mg daily. - Continue Lasix 20 PRN. - Plan to repeat echo in 2-3 months with GDMT titrated.  - BMET, BNP today. Repeat BMET at next visit.  2. A Fib  - Developed A fib RVR 4/23 admit.  - NSR on ECG today. - Continue Eliquis 5 mg bid. No bleeding issues. - Decrease amiodarone to 100 mg daily.   3. H/o Hypercalcemia  - Calcium on admit 14.5>> 7.3  - Primary planning PET scan to further work up.   4. H/o Hypomagnesia/hypokalemia - Labs today.   5. ETOH abuse - In Rehab 10 years ago for ETOH abuse.  - Previously drank 20 beers every 2 days. - Back drinking, but not as heavy. Discussed cutting down, ideally quitting. - He has been in touch with his sponsor.  6. Tobacco Abuse - Heavy smoker for many years.  - Discussed smoking cessation.   Follow up in 2-3 weeks with PharmD (add back low-dose spiro if able, he will need a BMET at this visit) and 2 months with Dr. Haroldine Laws + echo.  Allena Katz, FNP-BC 12/20/21

## 2021-12-20 NOTE — Assessment & Plan Note (Signed)
Resolved likely due to volume depletion from alcohol use and medication effect

## 2021-12-21 ENCOUNTER — Other Ambulatory Visit: Payer: Self-pay

## 2021-12-21 ENCOUNTER — Other Ambulatory Visit: Payer: Self-pay | Admitting: Critical Care Medicine

## 2021-12-21 ENCOUNTER — Telehealth (HOSPITAL_COMMUNITY): Payer: Self-pay | Admitting: Vascular Surgery

## 2021-12-21 LAB — CBC WITH DIFFERENTIAL/PLATELET
Basophils Absolute: 0 10*3/uL (ref 0.0–0.2)
Basos: 1 %
EOS (ABSOLUTE): 0.1 10*3/uL (ref 0.0–0.4)
Eos: 1 %
Hematocrit: 36 % — ABNORMAL LOW (ref 37.5–51.0)
Hemoglobin: 12.4 g/dL — ABNORMAL LOW (ref 13.0–17.7)
Immature Grans (Abs): 0 10*3/uL (ref 0.0–0.1)
Immature Granulocytes: 0 %
Lymphocytes Absolute: 0.8 10*3/uL (ref 0.7–3.1)
Lymphs: 10 %
MCH: 33.7 pg — ABNORMAL HIGH (ref 26.6–33.0)
MCHC: 34.4 g/dL (ref 31.5–35.7)
MCV: 98 fL — ABNORMAL HIGH (ref 79–97)
Monocytes Absolute: 0.4 10*3/uL (ref 0.1–0.9)
Monocytes: 5 %
Neutrophils Absolute: 6.7 10*3/uL (ref 1.4–7.0)
Neutrophils: 83 %
Platelets: 366 10*3/uL (ref 150–450)
RBC: 3.68 x10E6/uL — ABNORMAL LOW (ref 4.14–5.80)
RDW: 13.5 % (ref 11.6–15.4)
WBC: 7.9 10*3/uL (ref 3.4–10.8)

## 2021-12-21 MED ORDER — POTASSIUM CHLORIDE ER 10 MEQ PO TBCR
10.0000 meq | EXTENDED_RELEASE_TABLET | Freq: Every day | ORAL | 0 refills | Status: DC
  Filled 2021-12-21: qty 5, 5d supply, fill #0

## 2021-12-21 NOTE — Telephone Encounter (Signed)
Lvm giving pharm appt 6/28 @ 11 m asked pt to call back to confirm appt

## 2021-12-22 ENCOUNTER — Encounter: Payer: Self-pay | Admitting: Physical Medicine and Rehabilitation

## 2021-12-22 ENCOUNTER — Telehealth: Payer: Self-pay

## 2021-12-22 NOTE — Telephone Encounter (Signed)
Pt was called and is aware of results, DOB was confirmed.  ?

## 2021-12-22 NOTE — Telephone Encounter (Signed)
-----   Message from Elsie Stain, MD sent at 12/21/2021  6:20 AM EDT ----- Let pt know blood counts normal now no bleeding , labs from cardiology: potassium is slightly low, kidney normal, start potassium one pill daily for 5 days then stop, sent to pharmacy

## 2021-12-24 ENCOUNTER — Other Ambulatory Visit: Payer: Self-pay

## 2021-12-27 ENCOUNTER — Other Ambulatory Visit: Payer: Self-pay

## 2021-12-27 ENCOUNTER — Encounter (HOSPITAL_COMMUNITY): Payer: Self-pay

## 2021-12-28 NOTE — Progress Notes (Incomplete)
***In Progress***    Advanced Heart Failure Clinic Note   Primary Care: Luis Rigg, NP HF Cardiologist: Dr. Gala Romney  HPI:  Mr Luis Marquez is a 61 y.o. with HTN, emphysema, ETOH abuse, tobacco abuse, and new diagnosis of systolic HF.    Admitted 11/2021 after a fall (? Syncope) and found to have elevated calcium. Treated with IV fluids and IV Zometa. Found to be in AF with RVR, started on diltiazem gtt. Echo showed EF 20-25% RV mildly reduced. Diltiazem stopped and switched to amiodarone, eventual conversion to NSR. Echo reviewed by Dr Gala Romney, EF 25% with possible Takotsubo. Underwent R/LHC showing minimal CAD, normal filling pressures. cMRI showed improving LVEF 42%, evidence of potential Takotsubo CM, RV normal. Amio weaned off and GDMT titrated. Hospitalization c/b ETOH withdrawal/delirium and hypercalcemia. He was discharged home, weight 197 lbs.   Post hospital follow up 11/2021, NYHA II, on the dry side volume wise. Lasix changed to PRN.   Admitted 11/2021 with syncope, felt 2/2 hypotension and orthostasis. Ethanol level 116. Spironolactone and Entresto held, and received IVF. With h/o ETOH use, unintentional weight loss, hypercalcemia, and esophageal thickening on CT, GI was consulted. He underwent EGD, showing gastritis. Started on PPI and discharged home, weight 186 lbs.   Returned to [facility] for HF follow up 12/20/21. Overall was feeling fine. He noted SOB walking up inclines or further distances on flat ground. Limited mainly by right hip OA, getting steroid shot soon. Denied palpitations, CP, dizziness, edema, or PND/Orthopnea. Appetite was ok. No fever or chills. Weight at home was 185 pounds. Reported taking all medications, but missed a couple days recently. Drank 4 beers this weekend.  Today he returns to HF clinic for pharmacist medication titration. At last visit with APP, losartan 12.5 mg daily was initiated. Amiodarone was decreased to 100 mg daily.  Shortness of  breath/dyspnea on exertion? {YES NO:22349}  Orthopnea/PND? {YES NO:22349} Edema? {YES NO:22349} Lightheadedness/dizziness? {YES NO:22349} Daily weights at home? {YES NO:22349} Blood pressure/heart rate monitoring at home? {YES [id number] Following low-sodium/fluid-restricted diet? {YES NO:22349}  HF Medications: Metoprolol succinate 25 mg daily Losartan 12.5 mg daily Lasix 20 mg PRN  Has the patient been experiencing any side effects to the medications prescribed?  {YES NO:22349}  Does the patient have any problems obtaining medications due to transportation or finances?   {YES NO:22349}  Understanding of regimen: {excellent/good/fair/poor:19665} Understanding of indications: {excellent/good/fair/poor:19665} Potential of compliance: {excellent/good/fair/poor:19665} Patient understands to avoid NSAIDs. Patient understands to avoid decongestants.    Pertinent Lab Values: Serum creatinine ***, BUN ***, Potassium ***, Sodium ***, BNP ***, Magnesium ***, Digoxin ***   Vital Signs: Weight: *** (last clinic weight: ***) Blood pressure: ***  Heart rate: ***   Assessment/Plan: 1.  Chronic Systolic Heart Failure: - New reduced EF. Unclear etiology. No previous history of coronary disease.  - Echo (11/2021): EF 25%, mid and apical hypokinesis with sparing of base, RV mildly reduced, concerning for Takotsubo.  - R/LHC (11/2021): minimal CAD, normal filling pressures.  - NICM. ETOH abuse/HTN maybe contributing.  - cMRI (11/2021): LVEF improving 42%, evidence of potential Takotsubo CM, RV normal . - NYHA II. Volume status stable.  - Recent admit with AKI/orthostasis. MRA/ARNI stopped. - Continue Lasix 20 PRN. - Continue metoprolol XL 25 mg daily. - Continue losartan 12.5 mg daily. - Would avoid SGLT2i for now that he is back drinking ETOH. - Plan to repeat echo in 2-3 months with GDMT titrated.    2. A Fib  - Developed  A fib RVR 10/2021 admit.  - NSR on ECG 12/20/21. - Continue Eliquis 5  mg BID. No bleeding issues. - Continue amiodarone 100 mg daily.   3. H/o Hypercalcemia  - Calcium on admit 14.5>> 7.3  - Primary planning PET scan to further work up.   4. H/o Hypomagnesia/hypokalemia - Labs today. ***   5. ETOH abuse - In Rehab 10 years ago for ETOH abuse.  - Previously drank 20 beers every 2 days. - Back drinking, but not as heavy. Discussed cutting down, ideally quitting. - He has been in touch with his sponsor.   6. Tobacco Abuse - Heavy smoker for many years.  - Discussed smoking cessation.   Follow up ***   Karle Plumber, PharmD, BCPS, BCCP, CPP Heart Failure Clinic Pharmacist [phone]

## 2022-01-12 ENCOUNTER — Inpatient Hospital Stay (HOSPITAL_COMMUNITY)
Admission: RE | Admit: 2022-01-12 | Discharge: 2022-01-12 | Disposition: A | Discharge status: Home / Self Care | Payer: Self-pay | Source: Ambulatory Visit

## 2022-01-31 ENCOUNTER — Ambulatory Visit: Payer: Self-pay | Admitting: Nurse Practitioner

## 2022-02-21 ENCOUNTER — Emergency Department (HOSPITAL_COMMUNITY): Payer: Medicaid Other

## 2022-02-21 ENCOUNTER — Inpatient Hospital Stay (HOSPITAL_COMMUNITY)
Admission: EM | Admit: 2022-02-21 | Discharge: 2022-02-25 | DRG: 291 | Disposition: A | Discharge status: Home / Self Care | Payer: Medicaid Other | Attending: Internal Medicine | Admitting: Internal Medicine

## 2022-02-21 DIAGNOSIS — Z6824 Body mass index (BMI) 24.0-24.9, adult: Secondary | ICD-10-CM

## 2022-02-21 DIAGNOSIS — E876 Hypokalemia: Secondary | ICD-10-CM | POA: Diagnosis not present

## 2022-02-21 DIAGNOSIS — I10 Essential (primary) hypertension: Secondary | ICD-10-CM | POA: Diagnosis not present

## 2022-02-21 DIAGNOSIS — F1020 Alcohol dependence, uncomplicated: Secondary | ICD-10-CM | POA: Diagnosis present

## 2022-02-21 DIAGNOSIS — Z833 Family history of diabetes mellitus: Secondary | ICD-10-CM

## 2022-02-21 DIAGNOSIS — N179 Acute kidney failure, unspecified: Secondary | ICD-10-CM

## 2022-02-21 DIAGNOSIS — I5021 Acute systolic (congestive) heart failure: Secondary | ICD-10-CM | POA: Diagnosis present

## 2022-02-21 DIAGNOSIS — E440 Moderate protein-calorie malnutrition: Secondary | ICD-10-CM | POA: Diagnosis present

## 2022-02-21 DIAGNOSIS — F419 Anxiety disorder, unspecified: Secondary | ICD-10-CM | POA: Diagnosis present

## 2022-02-21 DIAGNOSIS — Z720 Tobacco use: Secondary | ICD-10-CM | POA: Diagnosis present

## 2022-02-21 DIAGNOSIS — K209 Esophagitis, unspecified without bleeding: Secondary | ICD-10-CM | POA: Diagnosis present

## 2022-02-21 DIAGNOSIS — I110 Hypertensive heart disease with heart failure: Principal | ICD-10-CM | POA: Diagnosis present

## 2022-02-21 DIAGNOSIS — R531 Weakness: Secondary | ICD-10-CM | POA: Diagnosis not present

## 2022-02-21 DIAGNOSIS — Y900 Blood alcohol level of less than 20 mg/100 ml: Secondary | ICD-10-CM | POA: Diagnosis present

## 2022-02-21 DIAGNOSIS — Z743 Need for continuous supervision: Secondary | ICD-10-CM | POA: Diagnosis not present

## 2022-02-21 DIAGNOSIS — I5022 Chronic systolic (congestive) heart failure: Secondary | ICD-10-CM | POA: Diagnosis present

## 2022-02-21 DIAGNOSIS — I480 Paroxysmal atrial fibrillation: Secondary | ICD-10-CM | POA: Diagnosis present

## 2022-02-21 DIAGNOSIS — Z91199 Patient's noncompliance with other medical treatment and regimen due to unspecified reason: Secondary | ICD-10-CM

## 2022-02-21 DIAGNOSIS — R1319 Other dysphagia: Secondary | ICD-10-CM

## 2022-02-21 DIAGNOSIS — I491 Atrial premature depolarization: Secondary | ICD-10-CM | POA: Diagnosis not present

## 2022-02-21 DIAGNOSIS — R000 Tachycardia, unspecified: Secondary | ICD-10-CM

## 2022-02-21 DIAGNOSIS — E8770 Fluid overload, unspecified: Secondary | ICD-10-CM

## 2022-02-21 DIAGNOSIS — J439 Emphysema, unspecified: Secondary | ICD-10-CM | POA: Diagnosis present

## 2022-02-21 DIAGNOSIS — Z7901 Long term (current) use of anticoagulants: Secondary | ICD-10-CM

## 2022-02-21 DIAGNOSIS — I5023 Acute on chronic systolic (congestive) heart failure: Secondary | ICD-10-CM | POA: Diagnosis present

## 2022-02-21 DIAGNOSIS — R609 Edema, unspecified: Secondary | ICD-10-CM

## 2022-02-21 DIAGNOSIS — Z79899 Other long term (current) drug therapy: Secondary | ICD-10-CM

## 2022-02-21 DIAGNOSIS — D649 Anemia, unspecified: Secondary | ICD-10-CM | POA: Diagnosis present

## 2022-02-21 DIAGNOSIS — R1110 Vomiting, unspecified: Secondary | ICD-10-CM | POA: Diagnosis not present

## 2022-02-21 DIAGNOSIS — I2510 Atherosclerotic heart disease of native coronary artery without angina pectoris: Secondary | ICD-10-CM | POA: Diagnosis present

## 2022-02-21 DIAGNOSIS — R600 Localized edema: Secondary | ICD-10-CM | POA: Diagnosis not present

## 2022-02-21 DIAGNOSIS — F1010 Alcohol abuse, uncomplicated: Principal | ICD-10-CM | POA: Diagnosis present

## 2022-02-21 DIAGNOSIS — F17210 Nicotine dependence, cigarettes, uncomplicated: Secondary | ICD-10-CM | POA: Diagnosis present

## 2022-02-21 MED ORDER — SODIUM CHLORIDE 0.9 % IV SOLN: Freq: Once | INTRAVENOUS | Status: AC

## 2022-02-21 MED ORDER — ONDANSETRON HCL 4 MG/2ML IJ SOLN
4.0000 mg | Freq: Once | INTRAMUSCULAR | Status: AC
  Administered 2022-02-21: 4 mg via INTRAVENOUS
  Filled 2022-02-21: qty 2

## 2022-02-21 NOTE — ED Provider Notes (Signed)
[facility] Provider Note  CSN: [id number] Arrival date & time: 02/21/22 2324  Chief Complaint(s) Palpitations  [name] is a 61 y.o. male {Add pertinent medical, surgical, social history, OB history to HPI:1}    Palpitations Palpitations quality:  Fast Onset quality:  At rest Duration:  4 weeks Timing:  Constant Progression:  Waxing and waning Chronicity:  Recurrent Context comment:  Daily EtOH use. Stopped taking his meds 6 weeks ago Relieved by:  Nothing Worsened by:  Nothing Associated symptoms: diaphoresis, dizziness, lower extremity edema, nausea and vomiting   Associated symptoms: no chest pain, no cough, no shortness of breath and no syncope   Risk factors: heart disease and hx of atrial fibrillation     Past Medical History Past Medical History:  Diagnosis Date  . Delirium tremens (HCC) 11/11/2014  . Fracture, intertrochanteric, right femur (HCC) 11/11/2014  . Hypertension   . Laceration of head 11/03/2021  . Syncope 12/07/2021   Patient Active Problem List   Diagnosis Date Noted  . Tobacco use 12/20/2021  . Esophageal dysphagia   . Acute esophagitis   . Alcohol abuse 12/08/2021  . Paroxysmal atrial fibrillation (HCC) 12/08/2021  . Chronic systolic CHF (congestive heart failure) (HCC) 11/22/2021  . Acute HFrEF (heart failure with reduced ejection fraction) (HCC) 11/04/2021  . Hypokalemia 11/03/2021  . Solitary pulmonary nodule 11/03/2021  . Emphysema lung (HCC) 11/03/2021  . Essential hypertension 11/03/2021  . AKI (acute kidney injury) (HCC)   . Hypercalcemia 11/02/2021  . Anemia 11/11/2014   Home Medication(s) Prior to Admission medications   Medication Sig Start Date End Date Taking? Authorizing Provider  acetaminophen (TYLENOL) 325 MG tablet Take 2 tablets (650 mg total) by mouth every 6 (six) hours as needed for mild pain (or Fever >/= 101). 12/10/21   Carmel Sacramento, MD  albuterol (VENTOLIN HFA) 108 (90  Base) MCG/ACT inhaler Inhale 2 puffs into the lungs every 6 (six) hours as needed for wheezing or shortness of breath. 12/20/21   Storm Frisk, MD  amiodarone (PACERONE) 200 MG tablet Take 0.5 tablets (100 mg total) by mouth daily. 12/20/21 08/17/22  Jacklynn Ganong, FNP  apixaban (ELIQUIS) 5 MG TABS tablet Take 1 tablet (5 mg total) by mouth 2 (two) times daily. 12/20/21   Storm Frisk, MD  ferrous sulfate 325 (65 FE) MG tablet Take 1 tablet (325 mg total) by mouth 2 (two) times daily with a meal. 12/20/21 03/20/22  Storm Frisk, MD  folic acid (FOLVITE) 1 MG tablet Take 1 tablet (1 mg total) by mouth daily. 12/20/21   Storm Frisk, MD  furosemide (LASIX) 20 MG tablet Take 1 tablet (20 mg total) by mouth daily as needed for edema or fluid. For weight gain of 3 lbs in 24 hours or 5 lbs in a week 12/10/21 01/09/22  Carmel Sacramento, MD  gabapentin (NEURONTIN) 300 MG capsule Take 1 capsule (300 mg total) by mouth at bedtime. 12/20/21 01/19/22  Storm Frisk, MD  losartan (COZAAR) 25 MG tablet Take 0.5 tablets (12.5 mg total) by mouth daily. 12/20/21 06/18/22  Jacklynn Ganong, FNP  metoprolol succinate (TOPROL-XL) 25 MG 24 hr tablet Take 1 tablet (25 mg total) by mouth daily. 12/20/21 01/19/22  Storm Frisk, MD  Multiple Vitamin (MULTIVITAMIN WITH MINERALS) TABS tablet Take 1 tablet by mouth daily.    [provider]  pantoprazole (PROTONIX) 40 MG tablet Take 1 tablet (40 mg total) by mouth 2 (two)  times daily. 12/20/21 01/19/22  Storm Frisk, MD  potassium chloride (KLOR-CON) 10 MEQ tablet Take 1 tablet (10 mEq total) by mouth daily for 5 days. 12/21/21 12/26/21  Storm Frisk, MD  sucralfate (CARAFATE) 1 g tablet Take 1 tablet (1 g total) by mouth 4 (four) times daily -  with meals and at bedtime for 14 days. 12/20/21 01/03/22  Storm Frisk, MD                                                                                                                                     Allergies Patient has no known allergies.  Review of Systems Review of Systems  Constitutional:  Positive for diaphoresis.  Respiratory:  Negative for cough and shortness of breath.   Cardiovascular:  Positive for palpitations. Negative for chest pain and syncope.  Gastrointestinal:  Positive for nausea and vomiting.  Neurological:  Positive for dizziness.   As noted in HPI  Physical Exam Vital Signs  I have reviewed the triage vital signs BP (!) 160/116 (BP Location: Right Arm)   Pulse (!) 114   Temp 98.2 F (36.8 C) (Oral)   Resp (!) 35   Ht 6\' 1"  (1.854 m)   Wt 84.9 kg   SpO2 95%   BMI 24.69 kg/m  *** Physical Exam Vitals reviewed.  Constitutional:      General: He is not in acute distress.    Appearance: He is well-developed. He is not diaphoretic.  HENT:     Head: Normocephalic and atraumatic.     Nose: Nose normal.  Eyes:     General: No scleral icterus.       Right eye: No discharge.        Left eye: No discharge.     Conjunctiva/sclera: Conjunctivae normal.     Pupils: Pupils are equal, round, and reactive to light.  Cardiovascular:     Rate and Rhythm: Regular rhythm. Tachycardia present.     Heart sounds: No murmur heard.    No friction rub. No gallop.  Pulmonary:     Effort: Pulmonary effort is normal. No respiratory distress.     Breath sounds: Decreased air movement present. No stridor. Examination of the right-lower field reveals wheezing. Examination of the left-lower field reveals wheezing. Wheezing present. No rales.  Abdominal:     General: There is no distension.     Palpations: Abdomen is soft.     Tenderness: There is no abdominal tenderness.  Musculoskeletal:        General: No tenderness.     Cervical back: Normal range of motion and neck supple.     Right lower leg: 2+ Pitting Edema present.     Left lower leg: 2+ Pitting Edema present.  Skin:    General: Skin is warm and dry.     Findings: No erythema or rash.  Neurological:      Mental  Status: He is alert and oriented to person, place, and time.     ED Results and Treatments Labs (all labs ordered are listed, but only abnormal results are displayed) Labs Reviewed - No data to display                                                                                                                       EKG  EKG Interpretation  Date/Time:    Ventricular Rate:    PR Interval:    QRS Duration:   QT Interval:    QTC Calculation:   R Axis:     Text Interpretation:         Radiology No results found.  Medications Ordered in ED Medications - No data to display                                                                                                                                   Procedures Procedures  (including critical care time)  Medical Decision Making / ED Course   Medical Decision Making Amount and/or Complexity of Data Reviewed Labs: ordered. Decision-making details documented in ED Course. Radiology: ordered and independent interpretation performed. Decision-making details documented in ED Course. ECG/medicine tests: ordered and independent interpretation performed. Decision-making details documented in ED Course.  Risk Prescription drug management. Parenteral controlled substances. Decision regarding hospitalization. Diagnosis or treatment significantly limited by social determinants of health.     Clinical Course as of 02/21/22 2350  Mon Feb 21, 2022  2345 Palpitations: - DDX: etoh WD, CHF exacerbation, COPD exacerbation, PNA, electrolyte/metabolic derangements. Will check for renal insufficiency, anemia   [PC]    Clinical Course User Index [PC] Rayan Dyal, Amadeo Garnet, MD      Final Clinical Impression(s) / ED Diagnoses Final diagnoses:  None    {Document critical care time when appropriate:1}  {Document review of labs and clinical decision tools ie heart score, Chads2Vasc2 etc:1}  {Document your independent  review of radiology images, and any outside records:1} {Document your discussion with family members, caretakers, and with consultants:1} {Document social determinants of health affecting pt's care:1} {Document your decision making why or why not admission, treatments were needed:1} This chart was dictated using voice recognition software.  Despite best efforts to proofread,  errors can occur which can change the documentation meaning.

## 2022-02-21 NOTE — ED Triage Notes (Signed)
Patient BIB EMS, per patient states he was dx with afib on 11/02/21. Reports "heart beating fast" denies chest pain and shortness of breath. Reports being diaphoretic, denies n/v

## 2022-02-22 ENCOUNTER — Encounter (HOSPITAL_COMMUNITY): Payer: Self-pay | Admitting: Internal Medicine

## 2022-02-22 ENCOUNTER — Other Ambulatory Visit: Payer: Self-pay

## 2022-02-22 DIAGNOSIS — Z6824 Body mass index (BMI) 24.0-24.9, adult: Secondary | ICD-10-CM | POA: Diagnosis not present

## 2022-02-22 DIAGNOSIS — I110 Hypertensive heart disease with heart failure: Secondary | ICD-10-CM | POA: Diagnosis not present

## 2022-02-22 DIAGNOSIS — R600 Localized edema: Secondary | ICD-10-CM | POA: Diagnosis not present

## 2022-02-22 DIAGNOSIS — Z79899 Other long term (current) drug therapy: Secondary | ICD-10-CM | POA: Diagnosis not present

## 2022-02-22 DIAGNOSIS — Z833 Family history of diabetes mellitus: Secondary | ICD-10-CM | POA: Diagnosis not present

## 2022-02-22 DIAGNOSIS — Z7901 Long term (current) use of anticoagulants: Secondary | ICD-10-CM | POA: Diagnosis not present

## 2022-02-22 DIAGNOSIS — I480 Paroxysmal atrial fibrillation: Secondary | ICD-10-CM | POA: Diagnosis not present

## 2022-02-22 DIAGNOSIS — I2510 Atherosclerotic heart disease of native coronary artery without angina pectoris: Secondary | ICD-10-CM | POA: Diagnosis not present

## 2022-02-22 DIAGNOSIS — F1010 Alcohol abuse, uncomplicated: Secondary | ICD-10-CM | POA: Diagnosis not present

## 2022-02-22 DIAGNOSIS — J439 Emphysema, unspecified: Secondary | ICD-10-CM | POA: Diagnosis not present

## 2022-02-22 DIAGNOSIS — F419 Anxiety disorder, unspecified: Secondary | ICD-10-CM | POA: Diagnosis present

## 2022-02-22 DIAGNOSIS — E876 Hypokalemia: Secondary | ICD-10-CM | POA: Diagnosis not present

## 2022-02-22 DIAGNOSIS — D649 Anemia, unspecified: Secondary | ICD-10-CM | POA: Diagnosis not present

## 2022-02-22 DIAGNOSIS — I5023 Acute on chronic systolic (congestive) heart failure: Secondary | ICD-10-CM | POA: Diagnosis not present

## 2022-02-22 DIAGNOSIS — R1110 Vomiting, unspecified: Secondary | ICD-10-CM | POA: Diagnosis not present

## 2022-02-22 DIAGNOSIS — Y900 Blood alcohol level of less than 20 mg/100 ml: Secondary | ICD-10-CM | POA: Diagnosis present

## 2022-02-22 DIAGNOSIS — E440 Moderate protein-calorie malnutrition: Secondary | ICD-10-CM | POA: Diagnosis not present

## 2022-02-22 DIAGNOSIS — E8770 Fluid overload, unspecified: Secondary | ICD-10-CM | POA: Diagnosis present

## 2022-02-22 DIAGNOSIS — Z91199 Patient's noncompliance with other medical treatment and regimen due to unspecified reason: Secondary | ICD-10-CM | POA: Diagnosis not present

## 2022-02-22 DIAGNOSIS — F17210 Nicotine dependence, cigarettes, uncomplicated: Secondary | ICD-10-CM | POA: Diagnosis not present

## 2022-02-22 DIAGNOSIS — K209 Esophagitis, unspecified without bleeding: Secondary | ICD-10-CM | POA: Diagnosis present

## 2022-02-22 DIAGNOSIS — R000 Tachycardia, unspecified: Secondary | ICD-10-CM | POA: Diagnosis not present

## 2022-02-22 DIAGNOSIS — F1020 Alcohol dependence, uncomplicated: Secondary | ICD-10-CM | POA: Diagnosis not present

## 2022-02-22 LAB — CBC
HCT: 31 % — ABNORMAL LOW (ref 39.0–52.0)
Hemoglobin: 11.3 g/dL — ABNORMAL LOW (ref 13.0–17.0)
MCH: 33.7 pg (ref 26.0–34.0)
MCHC: 36.5 g/dL — ABNORMAL HIGH (ref 30.0–36.0)
MCV: 92.5 fL (ref 80.0–100.0)
Platelets: 198 10*3/uL (ref 150–400)
RBC: 3.35 MIL/uL — ABNORMAL LOW (ref 4.22–5.81)
RDW: 13.2 % (ref 11.5–15.5)
WBC: 8.5 10*3/uL (ref 4.0–10.5)
nRBC: 0 % (ref 0.0–0.2)

## 2022-02-22 LAB — COMPREHENSIVE METABOLIC PANEL
ALT: 34 U/L (ref 0–44)
ALT: 43 U/L (ref 0–44)
AST: 125 U/L — ABNORMAL HIGH (ref 15–41)
AST: 96 U/L — ABNORMAL HIGH (ref 15–41)
Albumin: 2 g/dL — ABNORMAL LOW (ref 3.5–5.0)
Albumin: 2.6 g/dL — ABNORMAL LOW (ref 3.5–5.0)
Alkaline Phosphatase: 112 U/L (ref 38–126)
Alkaline Phosphatase: 137 U/L — ABNORMAL HIGH (ref 38–126)
Anion gap: 14 (ref 5–15)
Anion gap: 20 — ABNORMAL HIGH (ref 5–15)
BUN: <5 mg/dL — ABNORMAL LOW (ref 8–23)
BUN: <5 mg/dL — ABNORMAL LOW (ref 8–23)
CO2: 30 mmol/L (ref 22–32)
CO2: 32 mmol/L (ref 22–32)
Calcium: 7.5 mg/dL — ABNORMAL LOW (ref 8.9–10.3)
Calcium: 8.1 mg/dL — ABNORMAL LOW (ref 8.9–10.3)
Chloride: 88 mmol/L — ABNORMAL LOW (ref 98–111)
Chloride: 94 mmol/L — ABNORMAL LOW (ref 98–111)
Creatinine, Ser: 1.05 mg/dL (ref 0.61–1.24)
Creatinine, Ser: 1.1 mg/dL (ref 0.61–1.24)
GFR, Estimated: >60 mL/min (ref >60)
GFR, Estimated: >60 mL/min (ref >60)
Glucose, Bld: 117 mg/dL — ABNORMAL HIGH (ref 70–99)
Glucose, Bld: 136 mg/dL — ABNORMAL HIGH (ref 70–99)
Potassium: 2 mmol/L — CL (ref 3.5–5.1)
Potassium: 2.2 mmol/L — CL (ref 3.5–5.1)
Sodium: 138 mmol/L (ref 135–145)
Sodium: 140 mmol/L (ref 135–145)
Total Bilirubin: 1 mg/dL (ref 0.3–1.2)
Total Bilirubin: 1.2 mg/dL (ref 0.3–1.2)
Total Protein: 4.9 g/dL — ABNORMAL LOW (ref 6.5–8.1)
Total Protein: 6.3 g/dL — ABNORMAL LOW (ref 6.5–8.1)

## 2022-02-22 LAB — PHOSPHORUS: Phosphorus: 3.4 mg/dL (ref 2.5–4.6)

## 2022-02-22 LAB — CBC WITH DIFFERENTIAL/PLATELET
Abs Immature Granulocytes: 0.05 10*3/uL (ref 0.00–0.07)
Basophils Absolute: 0.1 10*3/uL (ref 0.0–0.1)
Basophils Relative: 1 %
Eosinophils Absolute: 0.1 10*3/uL (ref 0.0–0.5)
Eosinophils Relative: 1 %
HCT: 36.1 % — ABNORMAL LOW (ref 39.0–52.0)
Hemoglobin: 13.5 g/dL (ref 13.0–17.0)
Immature Granulocytes: 0 %
Lymphocytes Relative: 9 %
Lymphs Abs: 1.1 10*3/uL (ref 0.7–4.0)
MCH: 34.2 pg — ABNORMAL HIGH (ref 26.0–34.0)
MCHC: 37.4 g/dL — ABNORMAL HIGH (ref 30.0–36.0)
MCV: 91.4 fL (ref 80.0–100.0)
Monocytes Absolute: 1 10*3/uL (ref 0.1–1.0)
Monocytes Relative: 8 %
Neutro Abs: 9.3 10*3/uL — ABNORMAL HIGH (ref 1.7–7.7)
Neutrophils Relative %: 81 %
Platelets: 255 10*3/uL (ref 150–400)
RBC: 3.95 MIL/uL — ABNORMAL LOW (ref 4.22–5.81)
RDW: 13 % (ref 11.5–15.5)
WBC: 11.5 10*3/uL — ABNORMAL HIGH (ref 4.0–10.5)
nRBC: 0 % (ref 0.0–0.2)

## 2022-02-22 LAB — TROPONIN I (HIGH SENSITIVITY)
Troponin I (High Sensitivity): 15 ng/L (ref <18)
Troponin I (High Sensitivity): 15 ng/L (ref <18)

## 2022-02-22 LAB — CBG MONITORING, ED: Glucose-Capillary: 149 mg/dL — ABNORMAL HIGH (ref 70–99)

## 2022-02-22 LAB — LIPASE, BLOOD: Lipase: 31 U/L (ref 11–51)

## 2022-02-22 LAB — BRAIN NATRIURETIC PEPTIDE: B Natriuretic Peptide: 104.5 pg/mL — ABNORMAL HIGH (ref 0.0–100.0)

## 2022-02-22 LAB — MAGNESIUM
Magnesium: 1.1 mg/dL — ABNORMAL LOW (ref 1.7–2.4)
Magnesium: 1.5 mg/dL — ABNORMAL LOW (ref 1.7–2.4)
Magnesium: 2.5 mg/dL — ABNORMAL HIGH (ref 1.7–2.4)

## 2022-02-22 LAB — BASIC METABOLIC PANEL
Anion gap: 10 (ref 5–15)
BUN: <5 mg/dL — ABNORMAL LOW (ref 8–23)
CO2: 36 mmol/L — ABNORMAL HIGH (ref 22–32)
Calcium: 7.4 mg/dL — ABNORMAL LOW (ref 8.9–10.3)
Chloride: 95 mmol/L — ABNORMAL LOW (ref 98–111)
Creatinine, Ser: 0.93 mg/dL (ref 0.61–1.24)
GFR, Estimated: >60 mL/min (ref >60)
Glucose, Bld: 98 mg/dL (ref 70–99)
Potassium: 2.5 mmol/L — CL (ref 3.5–5.1)
Sodium: 141 mmol/L (ref 135–145)

## 2022-02-22 LAB — ETHANOL: Alcohol, Ethyl (B): <10 mg/dL (ref <10)

## 2022-02-22 MED ORDER — POTASSIUM CHLORIDE 10 MEQ/100ML IV SOLN: 10.0000 meq | INTRAVENOUS | Status: DC

## 2022-02-22 MED ORDER — POTASSIUM CHLORIDE 20 MEQ PO PACK
60.0000 meq | PACK | Freq: Once | ORAL | Status: AC
Start: 2022-02-22 — End: 2022-02-22
  Administered 2022-02-22: 60 meq via ORAL
  Filled 2022-02-22: qty 3

## 2022-02-22 MED ORDER — THIAMINE HCL 100 MG PO TABS
100.0000 mg | ORAL_TABLET | Freq: Every day | ORAL | Status: DC
  Administered 2022-02-22 – 2022-02-25 (×4): 100 mg via ORAL
  Filled 2022-02-22 (×4): qty 1

## 2022-02-22 MED ORDER — POTASSIUM CHLORIDE 10 MEQ/100ML IV SOLN
10.0000 meq | INTRAVENOUS | Status: AC
  Administered 2022-02-22 (×3): 10 meq via INTRAVENOUS
  Filled 2022-02-22 (×3): qty 100

## 2022-02-22 MED ORDER — ENOXAPARIN SODIUM 80 MG/0.8ML IJ SOSY
80.0000 mg | PREFILLED_SYRINGE | Freq: Two times a day (BID) | INTRAMUSCULAR | Status: DC
  Administered 2022-02-22 – 2022-02-23 (×4): 80 mg via SUBCUTANEOUS
  Filled 2022-02-22 (×5): qty 0.8

## 2022-02-22 MED ORDER — LORAZEPAM 2 MG/ML IJ SOLN
1.0000 mg | Freq: Once | INTRAMUSCULAR | Status: AC
  Administered 2022-02-22: 1 mg via INTRAVENOUS
  Filled 2022-02-22: qty 1

## 2022-02-22 MED ORDER — POTASSIUM CHLORIDE CRYS ER 20 MEQ PO TBCR
40.0000 meq | EXTENDED_RELEASE_TABLET | Freq: Three times a day (TID) | ORAL | Status: DC
  Administered 2022-02-22 – 2022-02-25 (×10): 40 meq via ORAL
  Filled 2022-02-22 (×10): qty 2

## 2022-02-22 MED ORDER — METOPROLOL SUCCINATE ER 25 MG PO TB24
25.0000 mg | ORAL_TABLET | Freq: Every day | ORAL | Status: DC
  Administered 2022-02-22 – 2022-02-25 (×4): 25 mg via ORAL
  Filled 2022-02-22 (×4): qty 1

## 2022-02-22 MED ORDER — POTASSIUM CHLORIDE 10 MEQ/100ML IV SOLN
10.0000 meq | INTRAVENOUS | Status: AC
  Administered 2022-02-22 (×2): 10 meq via INTRAVENOUS
  Filled 2022-02-22 (×2): qty 100

## 2022-02-22 MED ORDER — MAGNESIUM SULFATE 2 GM/50ML IV SOLN
2.0000 g | Freq: Once | INTRAVENOUS | Status: AC
  Administered 2022-02-22: 2 g via INTRAVENOUS
  Filled 2022-02-22: qty 50

## 2022-02-22 MED ORDER — POTASSIUM CHLORIDE 20 MEQ PO PACK: 20.0000 meq | PACK | Freq: Two times a day (BID) | ORAL | Status: DC

## 2022-02-22 MED ORDER — MAGNESIUM SULFATE 4 GM/100ML IV SOLN
4.0000 g | Freq: Once | INTRAVENOUS | Status: AC
Start: 2022-02-22 — End: 2022-02-22
  Administered 2022-02-22: 4 g via INTRAVENOUS
  Filled 2022-02-22: qty 100

## 2022-02-22 MED ORDER — ACETAMINOPHEN 650 MG RE SUPP: 650.0000 mg | Freq: Four times a day (QID) | RECTAL | Status: DC | PRN

## 2022-02-22 MED ORDER — FOLIC ACID 1 MG PO TABS
1.0000 mg | ORAL_TABLET | Freq: Every day | ORAL | Status: DC
  Administered 2022-02-22 – 2022-02-25 (×4): 1 mg via ORAL
  Filled 2022-02-22 (×4): qty 1

## 2022-02-22 MED ORDER — FUROSEMIDE 10 MG/ML IJ SOLN
40.0000 mg | Freq: Two times a day (BID) | INTRAMUSCULAR | Status: DC
  Administered 2022-02-22: 40 mg via INTRAVENOUS
  Filled 2022-02-22: qty 4

## 2022-02-22 MED ORDER — ALBUTEROL SULFATE (2.5 MG/3ML) 0.083% IN NEBU: 2.5000 mg | INHALATION_SOLUTION | RESPIRATORY_TRACT | Status: DC | PRN

## 2022-02-22 MED ORDER — ONDANSETRON HCL 4 MG PO TABS: 4.0000 mg | ORAL_TABLET | Freq: Four times a day (QID) | ORAL | Status: DC | PRN

## 2022-02-22 MED ORDER — ACETAMINOPHEN 325 MG PO TABS: 650.0000 mg | ORAL_TABLET | Freq: Four times a day (QID) | ORAL | Status: DC | PRN

## 2022-02-22 MED ORDER — ACETAMINOPHEN 500 MG PO TABS: 500.0000 mg | ORAL_TABLET | Freq: Four times a day (QID) | ORAL | Status: DC | PRN

## 2022-02-22 MED ORDER — THIAMINE HCL 100 MG/ML IJ SOLN
100.0000 mg | Freq: Once | INTRAMUSCULAR | Status: AC
  Administered 2022-02-22: 100 mg via INTRAVENOUS
  Filled 2022-02-22: qty 2

## 2022-02-22 MED ORDER — AMIODARONE HCL 100 MG PO TABS
100.0000 mg | ORAL_TABLET | Freq: Every day | ORAL | Status: DC
  Administered 2022-02-22 – 2022-02-25 (×4): 100 mg via ORAL
  Filled 2022-02-22 (×4): qty 1

## 2022-02-22 MED ORDER — ONDANSETRON HCL 4 MG/2ML IJ SOLN
4.0000 mg | Freq: Four times a day (QID) | INTRAMUSCULAR | Status: DC | PRN
  Administered 2022-02-25: 4 mg via INTRAVENOUS
  Filled 2022-02-22: qty 2

## 2022-02-22 MED ORDER — PANTOPRAZOLE SODIUM 40 MG PO TBEC
40.0000 mg | DELAYED_RELEASE_TABLET | Freq: Two times a day (BID) | ORAL | Status: DC
  Administered 2022-02-22 – 2022-02-25 (×7): 40 mg via ORAL
  Filled 2022-02-22 (×7): qty 1

## 2022-02-22 MED ORDER — ADULT MULTIVITAMIN W/MINERALS CH
1.0000 | ORAL_TABLET | Freq: Every day | ORAL | Status: DC
  Administered 2022-02-22 – 2022-02-25 (×4): 1 via ORAL
  Filled 2022-02-22 (×4): qty 1

## 2022-02-22 NOTE — Progress Notes (Signed)
ANTICOAGULATION CONSULT NOTE - Initial Consult  Pharmacy Consult for Enoxaparin Indication: atrial fibrillation  No Known Allergies  Patient Measurements: Height: 6\' 1"  (185.4 cm) Weight: 84.9 kg (187 lb 2.7 oz) IBW/kg (Calculated) : 79.9  Vital Signs: Temp: 97.9 F (36.6 C) (08/08 0725) Temp Source: Oral (08/08 0725) BP: 152/100 (08/08 0803) Pulse Rate: 99 (08/08 0803)  Labs: Recent Labs    02/21/22 2332 02/22/22 0542  HGB 13.5 11.3*  HCT 36.1* 31.0*  PLT 255 198  CREATININE 1.10 1.05  TROPONINIHS  --  15    Estimated Creatinine Clearance: 81.4 mL/min (by C-G formula based on SCr of 1.05 mg/dL).   Medical History: Past Medical History:  Diagnosis Date   Delirium tremens (HCC) 11/11/2014   Fracture, intertrochanteric, right femur (HCC) 11/11/2014   Hypertension    Laceration of head 11/03/2021   Syncope 12/07/2021    Medications:  (Not in a hospital admission)   Assessment: 61 yo M with history of paroxysmal Afib and prescribed amiodarone and apixaban, has not been taking his medications for 6 weeks. Presents to ED with palpitations and found to be in sinus tachycardia with infrequent PACs. Pharmacy consulted to dose enoxaparin.   CrCl ~81 ml/min Hgb 11.3, Plt 198  Goal of Therapy:  Anti-Xa level 0.6-1 units/ml 4hrs after LMWH dose given Monitor platelets by anticoagulation protocol: Yes   Plan:  Start enoxaparin 80mg  (1mg /kg) [place] q12h  Monitor daily CBC, SCr, and for s/sx of bleeding  62 02/22/2022,8:35 AM

## 2022-02-22 NOTE — Progress Notes (Signed)
Heart Failure Navigator Progress Note  Assessed for Heart & Vascular TOC clinic readiness.  Patient does not meet criteria due to Advanced Heart failure Team. .     Emanuella Nickle, BSN, RN Heart Failure Nurse Navigator Secure Chat Only   

## 2022-02-22 NOTE — ED Notes (Signed)
Pt ambulatory to bathroom

## 2022-02-22 NOTE — ED Notes (Signed)
Critical Potassium reported 2.0. Cardama MD notified.

## 2022-02-22 NOTE — Progress Notes (Signed)
Date and time results received: 02/22/22 1555  Test: Potassium Critical Value: Potassium level of 2.5  Name of Provider Notified: Jetty Duhamel, MD  Orders Received? Or Actions Taken?:  Continue with current plan

## 2022-02-22 NOTE — Progress Notes (Signed)
Luis Marquez  FYB:[id number] DOB: 05-18-1961 DOA: 02/21/2022 PCP: Claiborne Rigg, NP    Brief Narrative:  61 year old with a history of alcohol abuse and prior severe withdrawal symptoms, HTN, PAF on apixaban, COPD, nonobstructive CAD, and Takotsubo's cardiomyopathy (EF 20-25%) who was brought into the ED arm by EMS with tachycardia.  The patient admitted to not taking any of his prescription medications in over a month.  He admitted to continuing to drink alcohol at a high level.  In the ER he was found to have a heart rate of 125.  His potassium was 2.0 and magnesium was 1.0.  Consultants:  None  Goals of Care:  Code Status: Full Code   DVT prophylaxis: Eliquis  Interim Hx: Patient was interviewed and examined by one of my partners earlier today.  He is resting comfortably in no apparent distress at the time of my follow-up visit.  Assessment & Plan:  Sinus tachycardia - history of paroxysmal atrial fibrillation Likely simply driven by severe electrolyte abnormalities as well as noncompliance with home medical therapies -heart rate somewhat improved -continue usual medical therapies, correct electrolyte abnormalities, and follow  Acute exacerbation of chronic systolic CHF/Takotsubo cardiomyopathy Due to noncompliance with medical therapy for extended period of time - baseline weight reportedly 81-86kg  Filed Weights   02/21/22 2338 02/22/22 1504  Weight: 84.9 kg 88.1 kg    Profound hypokalemia Potassium 2.0 at time of presentation -likely due to poor nutritional state and alcoholism -continue to supplement  Profound hypomagnesemia Magnesium 1.1 at time of presentation -likely due to poor nutritional state and alcoholism -continue to supplement  Alcoholism /alcohol abuse Monitor closely for withdrawal in this patient with a history of severe withdrawal symptoms  Normocytic anemia Likely due to alcoholism/poor nutritional state  Grade D esophagitis Noted via EGD May 2023  with hemostatic clips placed -due for 84-month follow-up EGD to reevaluate healing  COPD -tobacco abuse  Nonobstructive CAD Noted on left and right heart cath May 2023   Family Communication:  Disposition:     Objective: Blood pressure (!) 155/103, pulse (!) 102, temperature 98.3 F (36.8 C), temperature source Oral, resp. rate 20, height 6\' 1"  (1.854 m), weight 88.1 kg, SpO2 95 %.  Intake/Output Summary (Last 24 hours) at 02/22/2022 1511 Last data filed at 02/22/2022 1014 Gross per 24 hour  Intake --  Output 1600 ml  Net -1600 ml   Filed Weights   02/21/22 2338 02/22/22 1504  Weight: 84.9 kg 88.1 kg    Examination: Patient was examined by one of my partners earlier today  CBC: Recent Labs  Lab 02/21/22 2332 02/22/22 0542  WBC 11.5* 8.5  NEUTROABS 9.3*  --   HGB 13.5 11.3*  HCT 36.1* 31.0*  MCV 91.4 92.5  PLT 255 198   Basic Metabolic Panel: Recent Labs  Lab 02/21/22 2332 02/22/22 0542  NA 138 140  K 2.0* 2.2*  CL 88* 94*  CO2 30 32  GLUCOSE 136* 117*  BUN <5* <5*  CREATININE 1.10 1.05  CALCIUM 8.1* 7.5*  MG 1.1* 1.5*  PHOS  --  3.4   GFR: Estimated Creatinine Clearance: 81.4 mL/min (by C-G formula based on SCr of 1.05 mg/dL).  Liver Function Tests: Recent Labs  Lab 02/21/22 2332 02/22/22 0542  AST 125* 96*  ALT 43 34  ALKPHOS 137* 112  BILITOT 1.2 1.0  PROT 6.3* 4.9*  ALBUMIN 2.6* 2.0*   Recent Labs  Lab 02/21/22 2332  LIPASE 31  HbA1C: Hgb A1c MFr Bld  Date/Time Value Ref Range Status  11/06/2021 02:41 AM 5.2 4.8 - 5.6 % Final    Comment:    (NOTE) Pre diabetes:          5.7%-6.4%  Diabetes:              >6.4%  Glycemic control for   <7.0% adults with diabetes   03/04/2020 03:03 PM 5.4 4.8 - 5.6 % Final    Comment:             Prediabetes: 5.7 - 6.4          Diabetes: >6.4          Glycemic control for adults with diabetes: <7.0    Scheduled Meds:  amiodarone  100 mg Oral Daily   enoxaparin (LOVENOX) injection  80 mg  Subcutaneous Q12H   folic acid  1 mg Oral Daily   multivitamin with minerals  1 tablet Oral Daily   pantoprazole  40 mg Oral BID   potassium chloride  40 mEq Oral TID   thiamine  100 mg Oral Daily     LOS: 0 days   Lonia Blood, MD [facility] Office  [phone] Pager - Text Page per Loretha Stapler  If 7PM-7AM, please contact night-coverage per Amion 02/22/2022, 3:11 PM

## 2022-02-22 NOTE — H&P (Signed)
History and Physical    Luis Marquez UJW:[id number] DOB: 1960/08/26 DOA: 02/21/2022  PCP: Claiborne Rigg, NP  Patient coming from: home  I have personally briefly reviewed patient's old medical records in [facility]  Chief Complaint: tachycardia   HPI: Luis Marquez is a 61 y.o. male with medical history significant of ETOH abuse  with hx of severe w/d, HTN, recent dx of P afib on apixiban, COPD,  CAD nonobstructive, Takotsubo CMY with ref ( 20-25%)  who presents to ED BIB EMS for tachycardia. Of note patient states he has not taken any of in medications in over one month. He also notes that he continues to drink daily.  He presents due to  elevated Hr , he notes no associated chest pain, syncope, n/v/d but does he felt diaphoretic.    ED Course:  149/129, hr 125, rr 20 sat 98%  Labs: wbc 11.5, hgb 13.5, plt255 BNP104.5  Na 138, K 2, cr 1.1( 0.85) Ast 125, alt 43, alkph 137 ag 20 Lipase 31 Bnp 104.5 Cxr:NAd FAO:ZHYQM  with rbbb Tx ativan, thiamine, mag , potassium Review of Systems: As per HPI otherwise 10 point review of systems negative.   Past Medical History:  Diagnosis Date   Delirium tremens (HCC) 11/11/2014   Fracture, intertrochanteric, right femur (HCC) 11/11/2014   Hypertension    Laceration of head 11/03/2021   Syncope 12/07/2021    Past Surgical History:  Procedure Laterality Date   BIOPSY  12/10/2021   Procedure: BIOPSY;  Surgeon: Napoleon Form, MD;  Location: [facility];  Service: Gastroenterology;;   ESOPHAGOGASTRODUODENOSCOPY (EGD) WITH PROPOFOL N/A 12/10/2021   Procedure: ESOPHAGOGASTRODUODENOSCOPY (EGD) WITH PROPOFOL;  Surgeon: Napoleon Form, MD;  Location: [facility];  Service: Gastroenterology;  Laterality: N/A;   HEMOSTASIS CLIP PLACEMENT  12/10/2021   Procedure: HEMOSTASIS CLIP PLACEMENT;  Surgeon: Napoleon Form, MD;  Location: [facility];  Service: Gastroenterology;;   HERNIA REPAIR     INTRAMEDULLARY (IM) NAIL  INTERTROCHANTERIC Right 11/11/2014   Procedure: INTRAMEDULLARY (IM) NAIL INTERTROCHANTRIC;  Surgeon: Durene Romans, MD;  Location: [facility];  Service: Orthopedics;  Laterality: Right;   RIGHT/LEFT HEART CATH AND CORONARY ANGIOGRAPHY N/A 11/11/2021   Procedure: RIGHT/LEFT HEART CATH AND CORONARY ANGIOGRAPHY;  Surgeon: Dolores Patty, MD;  Location: [facility];  Service: Cardiovascular;  Laterality: N/A;   Rod right leg       reports that he has been smoking cigarettes. He has been smoking an average of .25 packs per day. He has never been exposed to tobacco smoke. He has never used smokeless tobacco. He reports that he does not currently use alcohol. He reports that he does not currently use drugs.  No Known Allergies  Family History  Problem Relation Age of Onset   Diabetes Mother     Prior to Admission medications   Medication Sig Start Date End Date Taking? Authorizing Provider  acetaminophen (TYLENOL) 325 MG tablet Take 2 tablets (650 mg total) by mouth every 6 (six) hours as needed for mild pain (or Fever >/= 101). 12/10/21   Carmel Sacramento, MD  albuterol (VENTOLIN HFA) 108 (90 Base) MCG/ACT inhaler Inhale 2 puffs into the lungs every 6 (six) hours as needed for wheezing or shortness of breath. 12/20/21   Storm Frisk, MD  amiodarone (PACERONE) 200 MG tablet Take 0.5 tablets (100 mg total) by mouth daily. 12/20/21 08/17/22  Jacklynn Ganong, FNP  apixaban (ELIQUIS) 5 MG TABS tablet Take 1 tablet (5  mg total) by mouth 2 (two) times daily. 12/20/21   Storm Frisk, MD  ferrous sulfate 325 (65 FE) MG tablet Take 1 tablet (325 mg total) by mouth 2 (two) times daily with a meal. 12/20/21 03/20/22  Storm Frisk, MD  folic acid (FOLVITE) 1 MG tablet Take 1 tablet (1 mg total) by mouth daily. 12/20/21   Storm Frisk, MD  furosemide (LASIX) 20 MG tablet Take 1 tablet (20 mg total) by mouth daily as needed for edema or fluid. For weight gain of 3 lbs in 24 hours or 5 lbs in a week  12/10/21 01/09/22  Carmel Sacramento, MD  gabapentin (NEURONTIN) 300 MG capsule Take 1 capsule (300 mg total) by mouth at bedtime. 12/20/21 01/19/22  Storm Frisk, MD  losartan (COZAAR) 25 MG tablet Take 0.5 tablets (12.5 mg total) by mouth daily. 12/20/21 06/18/22  Jacklynn Ganong, FNP  metoprolol succinate (TOPROL-XL) 25 MG 24 hr tablet Take 1 tablet (25 mg total) by mouth daily. 12/20/21 01/19/22  Storm Frisk, MD  Multiple Vitamin (MULTIVITAMIN WITH MINERALS) TABS tablet Take 1 tablet by mouth daily.    [provider]  pantoprazole (PROTONIX) 40 MG tablet Take 1 tablet (40 mg total) by mouth 2 (two) times daily. 12/20/21 01/19/22  Storm Frisk, MD  potassium chloride (KLOR-CON) 10 MEQ tablet Take 1 tablet (10 mEq total) by mouth daily for 5 days. 12/21/21 12/26/21  Storm Frisk, MD  sucralfate (CARAFATE) 1 g tablet Take 1 tablet (1 g total) by mouth 4 (four) times daily -  with meals and at bedtime for 14 days. 12/20/21 01/03/22  Storm Frisk, MD    Physical Exam: Vitals:   02/22/22 0230 02/22/22 0245 02/22/22 0300 02/22/22 0315  BP: 129/87 112/75 96/65 112/73  Pulse: (!) 107 (!) 106 (!) 103 (!) 103  Resp: (!) 23 (!) 24 (!) 22 (!) 22  Temp:      TempSrc:      SpO2: 93% 93% 92% 93%  Weight:      Height:         Vitals:   02/22/22 0230 02/22/22 0245 02/22/22 0300 02/22/22 0315  BP: 129/87 112/75 96/65 112/73  Pulse: (!) 107 (!) 106 (!) 103 (!) 103  Resp: (!) 23 (!) 24 (!) 22 (!) 22  Temp:      TempSrc:      SpO2: 93% 93% 92% 93%  Weight:      Height:      Constitutional: NAD, calm, comfortable Eyes: PERRL, lids and conjunctivae normal ENMT: Mucous membranes are moist. Posterior pharynx clear of any exudate or lesions.Normal dentition.  Neck: normal, supple, no masses, no thyromegaly Respiratory: clear to auscultation bilaterally, no wheezing, no crackles. Normal respiratory effort. No accessory muscle use.  Cardiovascular: Regular rate and rhythm, no murmurs / rubs  / gallops. No extremity edema. 2+ pedal pulses. No carotid bruits.  Abdomen: no tenderness, no masses palpated. No hepatosplenomegaly. Bowel sounds positive.  Musculoskeletal: no clubbing / cyanosis. No joint deformity upper and lower extremities. Good ROM, no contractures. Normal muscle tone.  Skin: no rashes, lesions, ulcers. No induration Neurologic: CN 2-12 grossly intact. Sensation intact, DTR normal. Strength 5/5 in all 4.  Psychiatric: Normal judgment and insight. Alert and oriented x 3. Normal mood.    Labs on Admission: I have personally reviewed following labs and imaging studies  CBC: Recent Labs  Lab 02/21/22 2332  WBC 11.5*  NEUTROABS 9.3*  HGB 13.5  HCT 36.1*  MCV 91.4  PLT 255   Basic Metabolic Panel: Recent Labs  Lab 02/21/22 2332  NA 138  K 2.0*  CL 88*  CO2 30  GLUCOSE 136*  BUN <5*  CREATININE 1.10  CALCIUM 8.1*  MG 1.1*   GFR: Estimated Creatinine Clearance: 77.7 mL/min (by C-G formula based on SCr of 1.1 mg/dL). Liver Function Tests: Recent Labs  Lab 02/21/22 2332  AST 125*  ALT 43  ALKPHOS 137*  BILITOT 1.2  PROT 6.3*  ALBUMIN 2.6*   Recent Labs  Lab 02/21/22 2332  LIPASE 31   No results for input(s): "AMMONIA" in the last 168 hours. Coagulation Profile: No results for input(s): "INR", "PROTIME" in the last 168 hours. Cardiac Enzymes: No results for input(s): "CKTOTAL", "CKMB", "CKMBINDEX", "TROPONINI" in the last 168 hours. BNP (last 3 results) No results for input(s): "PROBNP" in the last 8760 hours. HbA1C: No results for input(s): "HGBA1C" in the last 72 hours. CBG: No results for input(s): "GLUCAP" in the last 168 hours. Lipid Profile: No results for input(s): "CHOL", "HDL", "LDLCALC", "TRIG", "CHOLHDL", "LDLDIRECT" in the last 72 hours. Thyroid Function Tests: No results for input(s): "TSH", "T4TOTAL", "FREET4", "T3FREE", "THYROIDAB" in the last 72 hours. Anemia Panel: No results for input(s): "VITAMINB12", "FOLATE",  "FERRITIN", "TIBC", "IRON", "RETICCTPCT" in the last 72 hours. Urine analysis:    Component Value Date/Time   COLORURINE AMBER (A) 12/08/2021 0128   APPEARANCEUR CLOUDY (A) 12/08/2021 0128   LABSPEC 1.013 12/08/2021 0128   PHURINE 5.0 12/08/2021 0128   GLUCOSEU NEGATIVE 12/08/2021 0128   HGBUR NEGATIVE 12/08/2021 0128   BILIRUBINUR NEGATIVE 12/08/2021 0128   KETONESUR NEGATIVE 12/08/2021 0128   PROTEINUR NEGATIVE 12/08/2021 0128   NITRITE NEGATIVE 12/08/2021 0128   LEUKOCYTESUR TRACE (A) 12/08/2021 0128    Radiological Exams on Admission: DG Chest Portable 1 View  Result Date: 02/22/2022 CLINICAL DATA:  Vomiting EXAM: PORTABLE CHEST 1 VIEW COMPARISON:  12/07/2021 FINDINGS: Heart and mediastinal contours are within normal limits. No focal opacities or effusions. No acute bony abnormality. IMPRESSION: No active disease. Electronically Signed   By: Charlett Nose M.D.   On: 02/22/2022 00:01    EKG: Independently reviewed.see above  Assessment/Plan  Acute on Chronic CHF ref exacerbation Takotsubo CMY with ref ( 20-25%) -in setting of non compliance with medication x 6 weeks  continued etoh abuse,afib with rvr  - admit to progressive care  - place on chf protocol - lasix iv bid  - strict I/o , daily weight   Afib  -noted to be in sinus tachycardia currently  -in setting  hypokalemia, hypomagnesemia, chf exacerbation - now rate 114 -replete lytes  - resume home  beta blocker  - resume eliquis   Severe hypokalemia  -K , 2  -repleted  in ed -repeat labs now and continue replete as needed  -start oral supplement   Severe hypomagnesemia  - replete in ed -repeat labs now and continue repletion prn   ETOH abuse  -place on ciwa - MVI  -has hx of severe w/d , monitor closely   COPD -no acute exacerbation  -prn nebs  CAD nonobstructive -no active finding to suggest ACS   DVT prophylaxis: Eliquis Code Status: Full Family Communication: none at bedside Disposition  Plan: patient  expected to be admitted greater than 2 midnights  Consults called: n/a Admission status: progressive care   Lurline Del MD [facility]   If 7PM-7AM, please contact night-coverage [email or web address] Password [facility]  02/22/2022, 3:54  AM

## 2022-02-23 DIAGNOSIS — F1010 Alcohol abuse, uncomplicated: Secondary | ICD-10-CM

## 2022-02-23 LAB — MAGNESIUM: Magnesium: 2.1 mg/dL (ref 1.7–2.4)

## 2022-02-23 LAB — COMPREHENSIVE METABOLIC PANEL
ALT: 30 U/L (ref 0–44)
AST: 91 U/L — ABNORMAL HIGH (ref 15–41)
Albumin: 2 g/dL — ABNORMAL LOW (ref 3.5–5.0)
Alkaline Phosphatase: 111 U/L (ref 38–126)
Anion gap: 6 (ref 5–15)
BUN: <5 mg/dL — ABNORMAL LOW (ref 8–23)
CO2: 36 mmol/L — ABNORMAL HIGH (ref 22–32)
Calcium: 7.3 mg/dL — ABNORMAL LOW (ref 8.9–10.3)
Chloride: 99 mmol/L (ref 98–111)
Creatinine, Ser: 0.85 mg/dL (ref 0.61–1.24)
GFR, Estimated: >60 mL/min (ref >60)
Glucose, Bld: 95 mg/dL (ref 70–99)
Potassium: 2.8 mmol/L — ABNORMAL LOW (ref 3.5–5.1)
Sodium: 141 mmol/L (ref 135–145)
Total Bilirubin: 1.1 mg/dL (ref 0.3–1.2)
Total Protein: 4.6 g/dL — ABNORMAL LOW (ref 6.5–8.1)

## 2022-02-23 LAB — PHOSPHORUS: Phosphorus: 2.1 mg/dL — ABNORMAL LOW (ref 2.5–4.6)

## 2022-02-23 LAB — CBC
HCT: 27.8 % — ABNORMAL LOW (ref 39.0–52.0)
Hemoglobin: 10 g/dL — ABNORMAL LOW (ref 13.0–17.0)
MCH: 34 pg (ref 26.0–34.0)
MCHC: 36 g/dL (ref 30.0–36.0)
MCV: 94.6 fL (ref 80.0–100.0)
Platelets: 174 10*3/uL (ref 150–400)
RBC: 2.94 MIL/uL — ABNORMAL LOW (ref 4.22–5.81)
RDW: 13.6 % (ref 11.5–15.5)
WBC: 5.4 10*3/uL (ref 4.0–10.5)
nRBC: 0 % (ref 0.0–0.2)

## 2022-02-23 LAB — GLUCOSE, CAPILLARY: Glucose-Capillary: 130 mg/dL — ABNORMAL HIGH (ref 70–99)

## 2022-02-23 MED ORDER — ADULT MULTIVITAMIN W/MINERALS CH: 1.0000 | ORAL_TABLET | Freq: Every day | ORAL | Status: DC

## 2022-02-23 MED ORDER — THIAMINE HCL 100 MG/ML IJ SOLN: 100.0000 mg | Freq: Every day | INTRAMUSCULAR | Status: DC

## 2022-02-23 MED ORDER — FOLIC ACID 1 MG PO TABS: 1.0000 mg | ORAL_TABLET | Freq: Every day | ORAL | Status: DC

## 2022-02-23 MED ORDER — LORAZEPAM 1 MG PO TABS: 1.0000 mg | ORAL_TABLET | ORAL | Status: DC | PRN

## 2022-02-23 MED ORDER — LOPERAMIDE HCL 2 MG PO CAPS
2.0000 mg | ORAL_CAPSULE | ORAL | Status: DC | PRN
  Administered 2022-02-23 – 2022-02-25 (×4): 2 mg via ORAL
  Filled 2022-02-23 (×4): qty 1

## 2022-02-23 MED ORDER — THIAMINE HCL 100 MG PO TABS: 100.0000 mg | ORAL_TABLET | Freq: Every day | ORAL | Status: DC

## 2022-02-23 MED ORDER — LORAZEPAM 2 MG/ML IJ SOLN
1.0000 mg | INTRAMUSCULAR | Status: DC | PRN
  Administered 2022-02-25: 2 mg via INTRAVENOUS
  Filled 2022-02-23: qty 1

## 2022-02-23 NOTE — Progress Notes (Signed)
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 High risk for dysrhythmia - admitted for close monitoring and repletion.  Assessment & Plan:   Principal Problem:   Alcohol abuse Active Problems:   AKI (acute kidney injury) (HCC)   Hypercalcemia   Chronic systolic CHF (congestive heart failure) (HCC)   Paroxysmal atrial fibrillation (HCC)   Hypokalemia   Emphysema lung (HCC)   Acute HFrEF (heart failure with reduced ejection fraction) (HCC)   Tobacco use  Acute, on questionably chronic, symptomatic hypokalemia/hypomagnesemia Sinus tachycardia, history of paroxysmal afib - improving with repletion - Continue telemetry/close monitoring - K ordered - follow repeat labs  Acute HF exacerbation - known systolic dysfunction with Takotsubo cardiomyopathy, POA - Resolving - appears more euvolemic this am - no respiratory symptoms/issues - Follow clinically for ongoing diuretic needs -Low salt/fluid restrictions ongoing  Profound noncompliance -Patient reports non compliance with home medications, diet, alcohol abuse and poor follow up  Alcoholism/alcohol abuse - Continue CIWA protocol -Likely etiology for profound electrolyte abnormalities  Normocytic anemia Moderate protein caloric malnutrition -Secondary to chronic alcohol use and abuse; advance diet, continue vitamin supplementation per CIWA protocol  Paroxysmal A-fib, rate controlled  Transition to full dose Lovenox at intake, on apixaban at home Currently rate controlled on amiodarone and metoprolol  Esophagitis,  grade D, chronic, stable -Noted via EGD May 2023 with hemostatic clips placed -due for 35-month follow-up EGD to reevaluate healing - again noncompliant with follow up as above   CAD, nonobstructive history -Noted on left and right heart cath May 2023  COPD, not in acute exacerbation Tobacco use/abuse -Continue supportive care, continue to discuss smoking cessation  DVT prophylaxis: Lovenox, full dose Code Status: Full Family Communication: None  Status is: Inpatient  Dispo: The patient is from: Home              Anticipated d/c is to: Home              Anticipated d/c date is: 48 to 72 hours              Patient currently not medically stable for discharge  Consultants:  None  Procedures:  None  Antimicrobials:  None  Subjective: No acute issues or events overnight, somewhat tremulous today appears anxious with rapid speech, complaining of diarrhea early this morning  Objective: Vitals:   02/23/22 0018 02/23/22 0512 02/23/22 0841 02/23/22 1147  BP: (!) 151/94 (!) 132/92 137/81 (!) 138/93  Pulse: 88 96  94  Resp: 18 18 18 20   Temp: 98.7 F (37.1 C) 98.6 F (37 C) 98 F (36.7 C) 98.7 F (37.1 C)  TempSrc: Oral Oral Oral Oral  SpO2: 94% 91% 92% 95%  Weight: 88.9 kg     Height:        Intake/Output Summary (Last 24 hours) at 02/23/2022 1704 Last data filed at 02/23/2022 1154 Gross per 24 hour  Intake 597 ml  Output 675 ml  Net -78 ml   Filed Weights   02/21/22 2338 02/22/22 1504 02/23/22 0018  Weight: 84.9 kg 88.1 kg 88.9 kg    Examination:  General:  Pleasantly resting in bed, No acute distress. HEENT:  Normocephalic atraumatic.  Sclerae nonicteric, noninjected.  Extraocular movements intact bilaterally. Neck:  Without mass or deformity.  Trachea is midline. Lungs:  Clear to auscultate bilaterally without rhonchi, wheeze, or rales. Heart:  Regular rate and rhythm.  Without murmurs, rubs, or gallops. Abdomen:  Soft, nontender, nondistended.  Without  guarding or rebound. Extremities: Without cyanosis, clubbing, edema, or obvious deformity. Vascular:  Dorsalis pedis and posterior tibial pulses palpable bilaterally. Skin:  Warm and dry, no erythema, no ulcerations.   Data Reviewed: I have personally reviewed following labs and imaging studies  CBC: Recent Labs  Lab 02/21/22 2332 02/22/22 0542 02/23/22 0403  WBC 11.5* 8.5 5.4  NEUTROABS 9.3*  --   --   HGB 13.5 11.3* 10.0*  HCT 36.1* 31.0* 27.8*  MCV 91.4 92.5 94.6  PLT 255 198 174   Basic Metabolic Panel: Recent Labs  Lab 02/21/22 2332 02/22/22 0542 02/22/22 1429 02/23/22 0403  NA 138 140 141 141  K 2.0* 2.2* 2.5* 2.8*  CL 88* 94* 95* 99  CO2 30 32 36* 36*  GLUCOSE 136* 117* 98 95  BUN <5* <5* <5* <5*  CREATININE 1.10 1.05 0.93 0.85  CALCIUM 8.1* 7.5* 7.4* 7.3*  MG 1.1* 1.5* 2.5* 2.1  PHOS  --  3.4  --  2.1*   GFR: Estimated Creatinine Clearance: 100.5 mL/min (by C-G formula based on SCr of 0.85 mg/dL). Liver Function Tests: Recent Labs  Lab 02/21/22 2332 02/22/22 0542 02/23/22 0403  AST 125* 96* 91*  ALT 43 34 30  ALKPHOS 137* 112 111  BILITOT 1.2 1.0 1.1  PROT 6.3* 4.9* 4.6*  ALBUMIN 2.6* 2.0* 2.0*   Recent Labs  Lab 02/21/22 2332  LIPASE 31   No results for input(s): "AMMONIA" in the last 168 hours. Coagulation Profile: No results for input(s): "INR", "PROTIME" in the last 168 hours. Cardiac Enzymes: No results for input(s): "CKTOTAL", "CKMB", "CKMBINDEX", "TROPONINI" in the last 168 hours. BNP (last 3 results) No results for input(s): "PROBNP" in the last 8760 hours. HbA1C: No results for input(s): "HGBA1C" in the last 72 hours. CBG: Recent Labs  Lab 02/22/22 0807 02/23/22 0608  GLUCAP 149* 130*   Lipid Profile: No results for input(s): "CHOL", "HDL", "LDLCALC", "TRIG", "CHOLHDL", "LDLDIRECT" in the last 72 hours. Thyroid Function Tests: No results for input(s): "TSH", "T4TOTAL", "FREET4", "T3FREE", "THYROIDAB" in the last 72  hours. Anemia Panel: No results for input(s): "VITAMINB12", "FOLATE", "FERRITIN", "TIBC", "IRON", "RETICCTPCT" in the last 72 hours. Sepsis Labs: No results for input(s): "PROCALCITON", "LATICACIDVEN" in the last 168 hours.  No results found for this or any previous visit (from the past 240 hour(s)).       Radiology Studies: DG Chest Portable 1 View  Result Date: 02/22/2022 CLINICAL DATA:  Vomiting EXAM: PORTABLE CHEST 1 VIEW COMPARISON:  12/07/2021 FINDINGS: Heart and mediastinal contours are within normal limits. No focal opacities or effusions. No acute bony abnormality. IMPRESSION: No active disease. Electronically Signed   By: Charlett Nose M.D.   On: 02/22/2022 00:01    Scheduled Meds:  amiodarone  100 mg Oral Daily   enoxaparin (LOVENOX) injection  80 mg Subcutaneous Q12H   folic acid  1 mg Oral Daily   metoprolol succinate  25 mg Oral Daily   multivitamin with minerals  1 tablet Oral Daily   pantoprazole  40 mg Oral BID   potassium chloride  40 mEq Oral TID   thiamine  100 mg  Oral Daily    LOS: 1 day   Time spent:  Azucena Fallen, DO [facility]  If 7PM-7AM, please contact night-coverage [email or web address]  02/23/2022, 5:04 PM

## 2022-02-23 NOTE — Plan of Care (Signed)
?  Problem: Activity: ?Goal: Capacity to carry out activities will improve ?Outcome: Progressing ?  ?Problem: Education: ?Goal: Knowledge of General Education information will improve ?Description: Including pain rating scale, medication(s)/side effects and non-pharmacologic comfort measures ?Outcome: Progressing ?  ?Problem: Health Behavior/Discharge Planning: ?Goal: Ability to manage health-related needs will improve ?Outcome: Progressing ?  ?

## 2022-02-23 NOTE — TOC Progression Note (Signed)
Transition of Care [facility]) - Progression Note    Patient Details  Name: Luis Marquez MRN: [id number] Date of Birth: 10/19/1960  Transition of Care [facility]) CM/SW Contact  Leone Haven, RN Phone Number: 02/23/2022, 4:53 PM  Clinical Narrative:     CHF ex, etoh. Indep, room air, k is 2 will replete, TOC following.         Expected Discharge Plan and Services                                                 Social Determinants of Health (SDOH) Interventions    Readmission Risk Interventions     No data to display

## 2022-02-24 LAB — COMPREHENSIVE METABOLIC PANEL
ALT: 30 U/L (ref 0–44)
AST: 79 U/L — ABNORMAL HIGH (ref 15–41)
Albumin: 2.1 g/dL — ABNORMAL LOW (ref 3.5–5.0)
Alkaline Phosphatase: 122 U/L (ref 38–126)
Anion gap: 7 (ref 5–15)
BUN: <5 mg/dL — ABNORMAL LOW (ref 8–23)
CO2: 33 mmol/L — ABNORMAL HIGH (ref 22–32)
Calcium: 7.3 mg/dL — ABNORMAL LOW (ref 8.9–10.3)
Chloride: 101 mmol/L (ref 98–111)
Creatinine, Ser: 0.73 mg/dL (ref 0.61–1.24)
GFR, Estimated: >60 mL/min (ref >60)
Glucose, Bld: 97 mg/dL (ref 70–99)
Potassium: 2.9 mmol/L — ABNORMAL LOW (ref 3.5–5.1)
Sodium: 141 mmol/L (ref 135–145)
Total Bilirubin: 0.8 mg/dL (ref 0.3–1.2)
Total Protein: 4.9 g/dL — ABNORMAL LOW (ref 6.5–8.1)

## 2022-02-24 LAB — CBC
HCT: 27.8 % — ABNORMAL LOW (ref 39.0–52.0)
Hemoglobin: 10 g/dL — ABNORMAL LOW (ref 13.0–17.0)
MCH: 34.5 pg — ABNORMAL HIGH (ref 26.0–34.0)
MCHC: 36 g/dL (ref 30.0–36.0)
MCV: 95.9 fL (ref 80.0–100.0)
Platelets: 172 10*3/uL (ref 150–400)
RBC: 2.9 MIL/uL — ABNORMAL LOW (ref 4.22–5.81)
RDW: 13.7 % (ref 11.5–15.5)
WBC: 5.4 10*3/uL (ref 4.0–10.5)
nRBC: 0 % (ref 0.0–0.2)

## 2022-02-24 MED ORDER — ENOXAPARIN SODIUM 100 MG/ML IJ SOSY
90.0000 mg | PREFILLED_SYRINGE | Freq: Two times a day (BID) | INTRAMUSCULAR | Status: DC
Start: 2022-02-24 — End: 2022-02-25
  Administered 2022-02-24 – 2022-02-25 (×3): 90 mg via SUBCUTANEOUS
  Filled 2022-02-24 (×3): qty 1

## 2022-02-24 NOTE — Progress Notes (Signed)
Mobility Specialist Progress Note:   02/24/22 1206  Mobility  Activity Ambulated with assistance in hallway  Level of Assistance Standby assist, set-up cues, supervision of patient - no hands on  Assistive Device None  Distance Ambulated (ft) 550 ft  Activity Response Tolerated well  $Mobility charge 1 Mobility   Pt received in bed willing to participate in mobility. Complaints of L leg pain. Left in chair with call bell in reach and all needs met.   [facility] Daviyon Widmayer Mobility Specialist

## 2022-02-24 NOTE — Progress Notes (Signed)
PROGRESS NOTE    Luis Marquez  KGY:[id number] DOB: Nov 12, 1960 DOA: 02/21/2022 PCP: Claiborne Rigg, NP   Brief Narrative:  61 year old with a history of alcohol abuse and prior severe withdrawal symptoms, HTN, PAF on apixaban, COPD, nonobstructive CAD, and Takotsubo's cardiomyopathy (EF 20-25%) who was brought into the ED with symptomatic tachycardia and found to have profound electrolyte abnormalities in the ED (hypokalemia @ 2.0 and hypomag at 1.0). High risk for dysrhythmia - admitted for close monitoring and repletion.  Assessment & Plan:   Principal Problem:   Alcohol abuse Active Problems:   AKI (acute kidney injury) (HCC)   Hypercalcemia   Chronic systolic CHF (congestive heart failure) (HCC)   Paroxysmal atrial fibrillation (HCC)   Hypokalemia   Emphysema lung (HCC)   Acute HFrEF (heart failure with reduced ejection fraction) (HCC)   Tobacco use  Acute, on questionably chronic, symptomatic hypokalemia/hypomagnesemia Sinus tachycardia, history of paroxysmal afib - Minimally improving despite repletion - Continue telemetry/close monitoring - K ordered - follow repeat labs  Acute HF exacerbation - known systolic dysfunction with Takotsubo cardiomyopathy, POA - Resolving - appears more euvolemic this am - no respiratory symptoms/issues - Follow clinically for ongoing diuretic needs -Low salt/fluid restrictions ongoing  Profound noncompliance -Patient reports non compliance with home medications, diet, alcohol abuse and poor follow up  Alcoholism/alcohol abuse - Continue CIWA protocol -Likely etiology for profound electrolyte abnormalities  Normocytic anemia Moderate protein caloric malnutrition -Secondary to chronic alcohol use and abuse; advance diet, continue vitamin supplementation per CIWA protocol  Paroxysmal A-fib, rate controlled  Transition to full dose Lovenox at intake, on apixaban at home Currently rate controlled on amiodarone and  metoprolol  Esophagitis, grade D, chronic, stable -Noted via EGD May 2023 with hemostatic clips placed -due for 63-month follow-up EGD to reevaluate healing - again noncompliant with follow up as above   CAD, nonobstructive history -Noted on left and right heart cath May 2023  COPD, not in acute exacerbation Tobacco use/abuse -Continue supportive care, continue to discuss smoking cessation  DVT prophylaxis: Lovenox, full dose Code Status: Full Family Communication: None  Status is: Inpatient  Dispo: The patient is from: Home              Anticipated d/c is to: TBD - unable to return to current residence/group home              Anticipated d/c date is: 48 to 72 hours              Patient currently not medically stable for discharge, continues to have profound symptoms due to alcohol use/abuse and ongoing diarrhea.   Consultants:  None  Procedures:  None  Antimicrobials:  None  Subjective: No acute issues or events overnight, somewhat tremulous today appears anxious with rapid speech, complaining of ongoing diarrhea and weakness  Objective: Vitals:   02/23/22 1147 02/23/22 1710 02/23/22 2201 02/24/22 0432  BP: (!) 138/93 (!) 135/99 131/86 (!) 145/95  Pulse: 94  94 94  Resp: 20 18 18 20   Temp: 98.7 F (37.1 C)  99 F (37.2 C) 98.7 F (37.1 C)  TempSrc: Oral  Oral Oral  SpO2: 95% 95% 95% 94%  Weight:    90.5 kg  Height:        Intake/Output Summary (Last 24 hours) at 02/24/2022 0731 Last data filed at 02/23/2022 2150 Gross per 24 hour  Intake 570 ml  Output 550 ml  Net 20 ml    2151  02/22/22 1504 02/23/22 0018 02/24/22 0432  Weight: 88.1 kg 88.9 kg 90.5 kg    Examination:  General:  Pleasantly resting in bed, No acute distress. HEENT:  Normocephalic atraumatic.  Sclerae nonicteric, noninjected.  Extraocular movements intact bilaterally. Neck:  Without mass or deformity.  Trachea is midline. Lungs:  Clear to auscultate bilaterally without  rhonchi, wheeze, or rales. Heart:  Regular rate and rhythm.  Without murmurs, rubs, or gallops. Abdomen:  Soft, nontender, nondistended.  Without guarding or rebound. Extremities: Without cyanosis, clubbing, edema, or obvious deformity. Vascular:  Dorsalis pedis and posterior tibial pulses palpable bilaterally. Skin:  Warm and dry, no erythema, no ulcerations.   Data Reviewed: I have personally reviewed following labs and imaging studies  CBC: Recent Labs  Lab 02/21/22 2332 02/22/22 0542 02/23/22 0403 02/24/22 0421  WBC 11.5* 8.5 5.4 5.4  NEUTROABS 9.3*  --   --   --   HGB 13.5 11.3* 10.0* 10.0*  HCT 36.1* 31.0* 27.8* 27.8*  MCV 91.4 92.5 94.6 95.9  PLT 255 198 174 172    Basic Metabolic Panel: Recent Labs  Lab 02/21/22 2332 02/22/22 0542 02/22/22 1429 02/23/22 0403 02/24/22 0421  NA 138 140 141 141 141  K 2.0* 2.2* 2.5* 2.8* 2.9*  CL 88* 94* 95* 99 101  CO2 30 32 36* 36* 33*  GLUCOSE 136* 117* 98 95 97  BUN <5* <5* <5* <5* <5*  CREATININE 1.10 1.05 0.93 0.85 0.73  CALCIUM 8.1* 7.5* 7.4* 7.3* 7.3*  MG 1.1* 1.5* 2.5* 2.1  --   PHOS  --  3.4  --  2.1*  --     GFR: Estimated Creatinine Clearance: 106.8 mL/min (by C-G formula based on SCr of 0.73 mg/dL). Liver Function Tests: Recent Labs  Lab 02/21/22 2332 02/22/22 0542 02/23/22 0403 02/24/22 0421  AST 125* 96* 91* 79*  ALT 43 34 30 30  ALKPHOS 137* 112 111 122  BILITOT 1.2 1.0 1.1 0.8  PROT 6.3* 4.9* 4.6* 4.9*  ALBUMIN 2.6* 2.0* 2.0* 2.1*    Recent Labs  Lab 02/21/22 2332  LIPASE 31    No results for input(s): "AMMONIA" in the last 168 hours. Coagulation Profile: No results for input(s): "INR", "PROTIME" in the last 168 hours. Cardiac Enzymes: No results for input(s): "CKTOTAL", "CKMB", "CKMBINDEX", "TROPONINI" in the last 168 hours. BNP (last 3 results) No results for input(s): "PROBNP" in the last 8760 hours. HbA1C: No results for input(s): "HGBA1C" in the last 72 hours. CBG: Recent Labs   Lab 02/22/22 0807 02/23/22 0608  GLUCAP 149* 130*    Lipid Profile: No results for input(s): "CHOL", "HDL", "LDLCALC", "TRIG", "CHOLHDL", "LDLDIRECT" in the last 72 hours. Thyroid Function Tests: No results for input(s): "TSH", "T4TOTAL", "FREET4", "T3FREE", "THYROIDAB" in the last 72 hours. Anemia Panel: No results for input(s): "VITAMINB12", "FOLATE", "FERRITIN", "TIBC", "IRON", "RETICCTPCT" in the last 72 hours. Sepsis Labs: No results for input(s): "PROCALCITON", "LATICACIDVEN" in the last 168 hours.  No results found for this or any previous visit (from the past 240 hour(s)).       Radiology Studies: No results found.  Scheduled Meds:  amiodarone  100 mg Oral Daily   enoxaparin (LOVENOX) injection  90 mg Subcutaneous Q12H   folic acid  1 mg Oral Daily   metoprolol succinate  25 mg Oral Daily   multivitamin with minerals  1 tablet Oral Daily   pantoprazole  40 mg Oral BID   potassium chloride  40 mEq Oral TID  thiamine  100 mg Oral Daily    LOS: 2 days   Time spent:  Azucena Fallen, DO [facility]  If 7PM-7AM, please contact night-coverage [email or web address]  02/24/2022, 7:31 AM

## 2022-02-24 NOTE — Progress Notes (Signed)
ANTICOAGULATION CONSULT NOTE  Pharmacy Consult for Enoxaparin Indication: atrial fibrillation  No Known Allergies  Patient Measurements: Height: 6\' 1"  (185.4 cm) Weight: 90.5 kg (199 lb 8.3 oz) IBW/kg (Calculated) : 79.9  Vital Signs: Temp: 98.7 F (37.1 C) (08/10 0432) Temp Source: Oral (08/10 0432) BP: 145/95 (08/10 0432) Pulse Rate: 94 (08/10 0432)  Labs: Recent Labs    02/22/22 0542 02/22/22 1429 02/23/22 0403 02/24/22 0421  HGB 11.3*  --  10.0* 10.0*  HCT 31.0*  --  27.8* 27.8*  PLT 198  --  174 172  CREATININE 1.05 0.93 0.85 0.73  TROPONINIHS 15 15  --   --      Estimated Creatinine Clearance: 106.8 mL/min (by C-G formula based on SCr of 0.73 mg/dL).  Assessment: 61 yo M with history of paroxysmal Afib and prescribed amiodarone and apixaban, has not been taking his medications for 6 weeks. Presents to ED with palpitations and found to be in sinus tachycardia with infrequent PACs.  Continues on Lovenox, weight closer to 90 kg  CrCl ~81 ml/min  Goal of Therapy:  Anti-Xa level 0.6-1 units/ml 4hrs after LMWH dose given Monitor platelets by anticoagulation protocol: Yes   Plan:  Enoxaparin 90mg  (1mg /kg) [place] q12h  Monitor daily CBC, SCr, and for s/sx of bleeding  Thank you 62, PharmD 02/24/2022,7:31 AM

## 2022-02-25 ENCOUNTER — Other Ambulatory Visit: Payer: Self-pay

## 2022-02-25 LAB — COMPREHENSIVE METABOLIC PANEL
ALT: 27 U/L (ref 0–44)
AST: 64 U/L — ABNORMAL HIGH (ref 15–41)
Albumin: 2 g/dL — ABNORMAL LOW (ref 3.5–5.0)
Alkaline Phosphatase: 103 U/L (ref 38–126)
Anion gap: 5 (ref 5–15)
BUN: <5 mg/dL — ABNORMAL LOW (ref 8–23)
CO2: 28 mmol/L (ref 22–32)
Calcium: 7.2 mg/dL — ABNORMAL LOW (ref 8.9–10.3)
Chloride: 105 mmol/L (ref 98–111)
Creatinine, Ser: 0.71 mg/dL (ref 0.61–1.24)
GFR, Estimated: >60 mL/min (ref >60)
Glucose, Bld: 92 mg/dL (ref 70–99)
Potassium: 3.7 mmol/L (ref 3.5–5.1)
Sodium: 138 mmol/L (ref 135–145)
Total Bilirubin: 0.6 mg/dL (ref 0.3–1.2)
Total Protein: 4.8 g/dL — ABNORMAL LOW (ref 6.5–8.1)

## 2022-02-25 LAB — CBC
HCT: 27 % — ABNORMAL LOW (ref 39.0–52.0)
Hemoglobin: 9.5 g/dL — ABNORMAL LOW (ref 13.0–17.0)
MCH: 34.2 pg — ABNORMAL HIGH (ref 26.0–34.0)
MCHC: 35.2 g/dL (ref 30.0–36.0)
MCV: 97.1 fL (ref 80.0–100.0)
Platelets: 141 10*3/uL — ABNORMAL LOW (ref 150–400)
RBC: 2.78 MIL/uL — ABNORMAL LOW (ref 4.22–5.81)
RDW: 13.7 % (ref 11.5–15.5)
WBC: 5.4 10*3/uL (ref 4.0–10.5)
nRBC: 0 % (ref 0.0–0.2)

## 2022-02-25 LAB — GLUCOSE, CAPILLARY: Glucose-Capillary: 111 mg/dL — ABNORMAL HIGH (ref 70–99)

## 2022-02-25 MED ORDER — THIAMINE HCL 100 MG PO TABS
100.0000 mg | ORAL_TABLET | Freq: Every day | ORAL | 2 refills | Status: DC
  Filled 2022-02-25: qty 30, 30d supply, fill #0

## 2022-02-25 MED ORDER — CHLORDIAZEPOXIDE HCL 25 MG PO CAPS
25.0000 mg | ORAL_CAPSULE | Freq: Four times a day (QID) | ORAL | Status: DC
  Administered 2022-02-25: 25 mg via ORAL
  Filled 2022-02-25: qty 1

## 2022-02-25 MED ORDER — AMIODARONE HCL 200 MG PO TABS
100.0000 mg | ORAL_TABLET | Freq: Every day | ORAL | 2 refills | Status: DC
  Filled 2022-02-25: qty 15, 30d supply, fill #0

## 2022-02-25 MED ORDER — APIXABAN 5 MG PO TABS
5.0000 mg | ORAL_TABLET | Freq: Two times a day (BID) | ORAL | 2 refills | Status: DC
  Filled 2022-02-25 (×2): qty 60, 30d supply, fill #0

## 2022-02-25 MED ORDER — CHLORDIAZEPOXIDE HCL 25 MG PO CAPS: 25.0000 mg | ORAL_CAPSULE | Freq: Three times a day (TID) | ORAL | Status: DC

## 2022-02-25 MED ORDER — FOLIC ACID 1 MG PO TABS
1.0000 mg | ORAL_TABLET | Freq: Every day | ORAL | 2 refills | Status: DC
  Filled 2022-02-25: qty 30, 30d supply, fill #0

## 2022-02-25 MED ORDER — CHLORDIAZEPOXIDE HCL 25 MG PO CAPS
25.0000 mg | ORAL_CAPSULE | Freq: Three times a day (TID) | ORAL | 0 refills | Status: AC
  Filled 2022-02-25: qty 9, 3d supply, fill #0

## 2022-02-25 MED ORDER — CHLORDIAZEPOXIDE HCL 25 MG PO CAPS
25.0000 mg | ORAL_CAPSULE | Freq: Every day | ORAL | 0 refills | Status: AC
  Filled 2022-02-25: qty 3, 3d supply, fill #0

## 2022-02-25 MED ORDER — CHLORDIAZEPOXIDE HCL 25 MG PO CAPS: 25.0000 mg | ORAL_CAPSULE | Freq: Every day | ORAL | Status: DC

## 2022-02-25 MED ORDER — ADULT MULTIVITAMIN W/MINERALS CH
1.0000 | ORAL_TABLET | Freq: Every day | ORAL | 2 refills | Status: DC
  Filled 2022-02-25: qty 30, 30d supply, fill #0

## 2022-02-25 MED ORDER — CHLORDIAZEPOXIDE HCL 25 MG PO CAPS
25.0000 mg | ORAL_CAPSULE | Freq: Four times a day (QID) | ORAL | 0 refills | Status: AC
  Filled 2022-02-25: qty 12, 3d supply, fill #0

## 2022-02-25 MED ORDER — METOPROLOL SUCCINATE ER 25 MG PO TB24
25.0000 mg | ORAL_TABLET | Freq: Every day | ORAL | 2 refills | Status: DC
  Filled 2022-02-25: qty 30, 30d supply, fill #0

## 2022-02-25 MED ORDER — CHLORDIAZEPOXIDE HCL 25 MG PO CAPS
25.0000 mg | ORAL_CAPSULE | ORAL | 0 refills | Status: AC
  Filled 2022-02-25: qty 6, 3d supply, fill #0

## 2022-02-25 MED ORDER — CHLORDIAZEPOXIDE HCL 25 MG PO CAPS: 25.0000 mg | ORAL_CAPSULE | ORAL | Status: DC

## 2022-02-25 MED ORDER — PANTOPRAZOLE SODIUM 40 MG PO TBEC
40.0000 mg | DELAYED_RELEASE_TABLET | Freq: Two times a day (BID) | ORAL | 2 refills | Status: DC
  Filled 2022-02-25: qty 60, 30d supply, fill #0

## 2022-02-25 NOTE — Discharge Summary (Signed)
Physician Discharge Summary  Luis Marquez JYN:[id number] DOB: February 14, 1961 DOA: 02/21/2022  PCP: Claiborne Rigg, NP  Admit date: 02/21/2022 Discharge date: 02/25/2022  Admitted From: Home Disposition: Same  Recommendations for Outpatient Follow-up:  Follow up with PCP in 1-2 weeks  Home Health: None Equipment/Devices: None  Discharge Condition: Stable CODE STATUS: Full Diet recommendation: Low-salt low-fat diet  Brief/Interim Summary: 61 year old with a history of alcohol abuse and prior severe withdrawal symptoms, HTN, PAF on apixaban, COPD, nonobstructive CAD, and Takotsubo's cardiomyopathy (EF 20-25%) who was brought into the ED with symptomatic tachycardia and found to have profound electrolyte abnormalities in the ED (hypokalemia @ 2.0 and hypomag at 1.0). High risk for dysrhythmia - admitted for close monitoring and repletion.  Patient symptoms have essentially resolved no longer having any diarrhea, labs have stabilized without any further profound symptoms or [place] normalities patient is otherwise stable for discharge home.  He has known history of alcohol use and abuse but has been 5 days since last drink, he should be passed any overt withdrawal symptoms but given his ongoing anxiety about cessation we will discharge patient on Librium taper over the next 12 days.  Lengthy discussion that patient cannot take these medications while he drinks, if he chooses to continue drinking these medications will need to be discontinued immediately.  Otherwise recommend close follow-up with PCP in the next 1 to 2 weeks and commended him on his choice to quit drinking.  Discharge Diagnoses:  Principal Problem:   Alcohol abuse Active Problems:   AKI (acute kidney injury) (HCC)   Hypercalcemia   Chronic systolic CHF (congestive heart failure) (HCC)   Paroxysmal atrial fibrillation (HCC)   Hypokalemia   Emphysema lung (HCC)   Acute HFrEF (heart failure with reduced ejection fraction) (HCC)    Tobacco use    Discharge Instructions  Discharge Instructions     Discharge patient   Complete by: As directed    Discharge disposition: 01-Home or Self Care   Discharge patient date: 02/25/2022      Allergies as of 02/25/2022   No Known Allergies      Medication List     STOP taking these medications    albuterol 108 (90 Base) MCG/ACT inhaler Commonly known as: Ventolin HFA   BC FAST PAIN RELIEF MAX STR PO   ferrous sulfate 325 (65 FE) MG tablet   furosemide 20 MG tablet Commonly known as: LASIX   gabapentin 300 MG capsule Commonly known as: NEURONTIN   losartan 25 MG tablet Commonly known as: COZAAR   potassium chloride 10 MEQ tablet Commonly known as: KLOR-CON   sucralfate 1 g tablet Commonly known as: CARAFATE       TAKE these medications    acetaminophen 325 MG tablet Commonly known as: TYLENOL Take 2 tablets (650 mg total) by mouth every 6 (six) hours as needed for mild pain (or Fever >/= 101).   amiodarone 200 MG tablet Commonly known as: PACERONE Take 0.5 tablets (100 mg total) by mouth daily.   apixaban 5 MG Tabs tablet Commonly known as: ELIQUIS Take 1 tablet (5 mg total) by mouth 2 (two) times daily.   chlordiazePOXIDE 25 MG capsule Commonly known as: LIBRIUM Take 1 capsule (25 mg total) by mouth 4 (four) times daily for 3 days.   chlordiazePOXIDE 25 MG capsule Commonly known as: LIBRIUM Take 1 capsule (25 mg total) by mouth 3 (three) times daily for 3 days. Start taking on: February 26, 2022   chlordiazePOXIDE 25 MG  capsule Commonly known as: LIBRIUM Take 1 capsule (25 mg total) by mouth 2 (two) times daily in the am and at bedtime. for 3 days. Start taking on: February 27, 2022   chlordiazePOXIDE 25 MG capsule Commonly known as: LIBRIUM Take 1 capsule (25 mg total) by mouth daily for 3 days. Start taking on: February 28, 2022   folic acid 1 MG tablet Commonly known as: FOLVITE Take 1 tablet (1 mg total) by mouth daily.    metoprolol succinate 25 MG 24 hr tablet Commonly known as: TOPROL-XL Take 1 tablet (25 mg total) by mouth daily. Start taking on: February 26, 2022   multivitamin with minerals Tabs tablet Take 1 tablet by mouth daily.   pantoprazole 40 MG tablet Commonly known as: PROTONIX Take 1 tablet (40 mg total) by mouth 2 (two) times daily.   thiamine 100 MG tablet Commonly known as: VITAMIN B1 Take 1 tablet (100 mg total) by mouth daily. Start taking on: February 26, 2022        Follow-up Information     [facility] Follow up on 03/02/2022.   Why: @3 :10pm Contact information: [address] [place] [phone]               No Known Allergies  Consultations: None  Procedures/Studies: DG Chest Portable 1 View  Result Date: 02/22/2022 CLINICAL DATA:  Vomiting EXAM: PORTABLE CHEST 1 VIEW COMPARISON:  12/07/2021 FINDINGS: Heart and mediastinal contours are within normal limits. No focal opacities or effusions. No acute bony abnormality. IMPRESSION: No active disease. Electronically Signed   By: 12/09/2021 M.D.   On: 02/22/2022 00:01     Subjective: No acute issues or events overnight minimal tremors today right hand but otherwise without overt symptoms other than anxiety.  Denies chest pain shortness of breath nausea vomiting diarrhea constipation headache fevers or chills   Discharge Exam: Vitals:   02/25/22 0855 02/25/22 1300  BP: 112/77 119/83  Pulse: 91 85  Resp: 18 18  Temp: 98.6 F (37 C) 98.4 F (36.9 C)  SpO2: 98% 95%   Vitals:   02/25/22 0059 02/25/22 0100 02/25/22 0855 02/25/22 1300  BP: (!) 142/92 (!) 142/92 112/77 119/83  Pulse: 91 90 91 85  Resp: (!) 29 (!) 21 18 18   Temp: 98.7 F (37.1 C) 97.8 F (36.6 C) 98.6 F (37 C) 98.4 F (36.9 C)  TempSrc: Oral  Tympanic Oral  SpO2:  94% 98% 95%  Weight:  89.1 kg    Height:        General: Pt is alert, awake, not in acute  distress Cardiovascular: RRR, S1/S2 +, no rubs, no gallops Respiratory: CTA bilaterally, no wheezing, no rhonchi Abdominal: Soft, NT, ND, bowel sounds + Extremities: no edema, no cyanosis    The results of significant diagnostics from this hospitalization (including imaging, microbiology, ancillary and laboratory) are listed below for reference.     Microbiology: No results found for this or any previous visit (from the past 240 hour(s)).   Labs: BNP (last 3 results) Recent Labs    12/20/21 1541 02/21/22 2332  BNP 595.9* 104.5*   Basic Metabolic Panel: Recent Labs  Lab 02/21/22 2332 02/22/22 0542 02/22/22 1429 02/23/22 0403 02/24/22 0421 02/25/22 0355  NA 138 140 141 141 141 138  K 2.0* 2.2* 2.5* 2.8* 2.9* 3.7  CL 88* 94* 95* 99 101 105  CO2 30 32 36* 36* 33* 28  GLUCOSE 136*  117* 98 95 97 92  BUN <5* <5* <5* <5* <5* <5*  CREATININE 1.10 1.05 0.93 0.85 0.73 0.71  CALCIUM 8.1* 7.5* 7.4* 7.3* 7.3* 7.2*  MG 1.1* 1.5* 2.5* 2.1  --   --   PHOS  --  3.4  --  2.1*  --   --    Liver Function Tests: Recent Labs  Lab 02/21/22 2332 02/22/22 0542 02/23/22 0403 02/24/22 0421 02/25/22 0355  AST 125* 96* 91* 79* 64*  ALT 43 34 30 30 27   ALKPHOS 137* 112 111 122 103  BILITOT 1.2 1.0 1.1 0.8 0.6  PROT 6.3* 4.9* 4.6* 4.9* 4.8*  ALBUMIN 2.6* 2.0* 2.0* 2.1* 2.0*   Recent Labs  Lab 02/21/22 2332  LIPASE 31   No results for input(s): "AMMONIA" in the last 168 hours. CBC: Recent Labs  Lab 02/21/22 2332 02/22/22 0542 02/23/22 0403 02/24/22 0421 02/25/22 0355  WBC 11.5* 8.5 5.4 5.4 5.4  NEUTROABS 9.3*  --   --   --   --   HGB 13.5 11.3* 10.0* 10.0* 9.5*  HCT 36.1* 31.0* 27.8* 27.8* 27.0*  MCV 91.4 92.5 94.6 95.9 97.1  PLT 255 198 174 172 141*   Cardiac Enzymes: No results for input(s): "CKTOTAL", "CKMB", "CKMBINDEX", "TROPONINI" in the last 168 hours. BNP: Invalid input(s): "POCBNP" CBG: Recent Labs  Lab 02/22/22 0807 02/23/22 0608 02/25/22 0717  GLUCAP  149* 130* 111*   D-Dimer No results for input(s): "DDIMER" in the last 72 hours. Hgb A1c No results for input(s): "HGBA1C" in the last 72 hours. Lipid Profile No results for input(s): "CHOL", "HDL", "LDLCALC", "TRIG", "CHOLHDL", "LDLDIRECT" in the last 72 hours. Thyroid function studies No results for input(s): "TSH", "T4TOTAL", "T3FREE", "THYROIDAB" in the last 72 hours.  Invalid input(s): "FREET3" Anemia work up No results for input(s): "VITAMINB12", "FOLATE", "FERRITIN", "TIBC", "IRON", "RETICCTPCT" in the last 72 hours. Urinalysis    Component Value Date/Time   COLORURINE AMBER (A) 12/08/2021 0128   APPEARANCEUR CLOUDY (A) 12/08/2021 0128   LABSPEC 1.013 12/08/2021 0128   PHURINE 5.0 12/08/2021 0128   GLUCOSEU NEGATIVE 12/08/2021 0128   HGBUR NEGATIVE 12/08/2021 0128   BILIRUBINUR NEGATIVE 12/08/2021 0128   KETONESUR NEGATIVE 12/08/2021 0128   PROTEINUR NEGATIVE 12/08/2021 0128   NITRITE NEGATIVE 12/08/2021 0128   LEUKOCYTESUR TRACE (A) 12/08/2021 0128   Sepsis Labs Recent Labs  Lab 02/22/22 0542 02/23/22 0403 02/24/22 0421 02/25/22 0355  WBC 8.5 5.4 5.4 5.4   Microbiology No results found for this or any previous visit (from the past 240 hour(s)).   Time coordinating discharge: Over 30 minutes  SIGNED:   04/27/22, DO [facility] 02/25/2022, 2:49 PM Pager   If 7PM-7AM, please contact night-coverage [email or web address]

## 2022-02-25 NOTE — TOC Initial Note (Addendum)
Transition of Care [facility]) - Initial/Assessment Note    Patient Details  Name: Luis Marquez MRN: [id number] Date of Birth: October 25, 1960  Transition of Care [facility]) CM/SW Contact:    Bethann Berkshire, [place] Phone Number: 02/25/2022, 8:46 AM  Clinical Narrative:                  RN notified CSW that pt is asking to speak with social work and there is a TOC consult placed for alcohol use as well. Met with pt who initially explains that he is seeking assistance with his previous and current hospital bills. CSW directed pt to financial department. Printed financial assistance information and application. Pt explains he is in the process of applying for disability and medicaid as well as early retirement. He lives at [place] but states he may be going to stay at a hotel post discharge; pt doesn't expand on reasons. States he injured himself while work in a Proof[employer] and house and has limited income. CSW discuss shelters as potential option though pt is not interested in going to shelters. CSW inquires about pt's alcohol use. He does not disclose much regarding alcohol use; denies any other substance use. Indicates he has had some history of alcohol treatment and reports he knows someone or people who will help him get into [facility] that he may pursue. He states he will likely have a friend pick him up at discharge. Pt states he believes he already has a PCP appointment at [facility] which Network engineer confirmed and added to AVS. He is appreciative of financial department resources given. No other needs identified.   Expected Discharge Plan: Home/Self Care Barriers to Discharge: Continued Medical Work up   Patient Goals and CMS Choice        Expected Discharge Plan and Services Expected Discharge Plan: Home/Self Care       Living arrangements for the past 2 months: [place]                                      Prior Living Arrangements/Services Living  arrangements for the past 2 months: [employer]   Patient language and need for interpreter reviewed:: Yes        Need for Family Participation in Patient Care: No (Comment) Care giver support system in place?: No (comment)   Criminal Activity/Legal Involvement Pertinent to Current Situation/Hospitalization: No - Comment as needed  Activities of Daily Living Home Assistive Devices/Equipment: None ADL Screening (condition at time of admission) Patient's cognitive ability adequate to safely complete daily activities?: Yes Is the patient deaf or have difficulty hearing?: No Does the patient have difficulty seeing, even when wearing glasses/contacts?: No Does the patient have difficulty concentrating, remembering, or making decisions?: No Patient able to express need for assistance with ADLs?: Yes Does the patient have difficulty dressing or bathing?: Yes Independently performs ADLs?: Yes (appropriate for developmental age) Does the patient have difficulty walking or climbing stairs?: No Weakness of Legs: Both Weakness of Arms/Hands: None  Permission Sought/Granted                  Emotional Assessment Appearance:: Appears stated age Attitude/Demeanor/Rapport: Engaged Affect (typically observed): Accepting, Adaptable Orientation: : Oriented to Self, Oriented to Place, Oriented to  Time, Oriented to Situation Alcohol / Substance Use: Not Applicable Psych Involvement: No (comment)  Admission diagnosis:  Hypokalemia [E87.6] Hypomagnesemia [E83.42] Peripheral  edema [R60.9] Tachycardia [R00.0] Fluid overload [E87.70] Patient Active Problem List   Diagnosis Date Noted   Tobacco use 12/20/2021   Esophageal dysphagia    Acute esophagitis    Alcohol abuse 12/08/2021   Paroxysmal atrial fibrillation ([place]) 62/22/9798   Chronic systolic CHF (congestive heart failure) ([place]) 11/22/2021   Acute HFrEF (heart failure with reduced ejection fraction) ([place]) 11/04/2021   Hypokalemia  11/03/2021   Solitary pulmonary nodule 11/03/2021   Emphysema lung ([place]) 11/03/2021   Essential hypertension 11/03/2021   AKI (acute kidney injury) ([place])    Hypercalcemia 11/02/2021   Anemia 11/11/2014   PCP:  Gildardo Pounds, NP Pharmacy:   [facility] [address], [place] [address] Phone: [phone] Fax: [phone]     Social Determinants of Health (SDOH) Interventions    Readmission Risk Interventions     No data to display

## 2022-02-25 NOTE — Progress Notes (Signed)
Medication education with teach back given to pt. Peripheral iv removed. Belongings with pt.

## 2022-02-28 ENCOUNTER — Telehealth: Payer: Self-pay

## 2022-02-28 ENCOUNTER — Other Ambulatory Visit: Payer: Self-pay

## 2022-02-28 NOTE — Telephone Encounter (Signed)
Transition Care Management Unsuccessful Follow-up Telephone Call  Date of discharge and from where:  02/25/2022, [facility]   Attempts:  1st Attempt  Reason for unsuccessful TCM follow-up call:  Left voice message on # [phone], call back requested.  Patient has appointment at [facility] with Bertram Denver, NP - 03/02/2022

## 2022-03-01 ENCOUNTER — Telehealth: Payer: Self-pay

## 2022-03-01 NOTE — Telephone Encounter (Signed)
Closing encounter

## 2022-03-01 NOTE — Telephone Encounter (Signed)
Transition Care Management Unsuccessful Follow-up Telephone Call  Date of discharge and from where:  02/25/2022, [facility]  Attempts:  2nd Attempt  Reason for unsuccessful TCM follow-up call:  Left voice message on # [phone], call back requested.   Patient has appointment at [facility] with Bertram Denver, NP - 03/02/2022

## 2022-03-02 ENCOUNTER — Encounter (HOSPITAL_COMMUNITY): Payer: Self-pay | Admitting: Internal Medicine

## 2022-03-02 ENCOUNTER — Ambulatory Visit (HOSPITAL_COMMUNITY): Payer: Medicaid Other

## 2022-03-02 ENCOUNTER — Ambulatory Visit: Payer: Self-pay | Admitting: Nurse Practitioner

## 2022-03-02 ENCOUNTER — Telehealth: Payer: Self-pay

## 2022-03-02 NOTE — Telephone Encounter (Signed)
Transition Care Management Unsuccessful Follow-up Telephone Call  Date of discharge and from where:  02/25/2022, [facility]  Attempts:  3rd Attempt  Reason for unsuccessful TCM follow-up call:  Left voice message on # [phone], call back requested.   Patient had an appointment at [facility] with Bertram Denver, NP today and he called and cancelled. It is noted that he will call to reschedule.

## 2022-03-03 ENCOUNTER — Other Ambulatory Visit: Payer: Self-pay

## 2022-03-04 ENCOUNTER — Other Ambulatory Visit: Payer: Self-pay

## 2022-03-04 NOTE — Telephone Encounter (Signed)
Patient called in states not ready to reschedule yet, until he gets his insurance and monty together.

## 2022-03-07 NOTE — Telephone Encounter (Signed)
I returned the call to the patient and he said he will call back to schedule a follow up appointment when he gets his insurance approved. He said he has submitted a Medicaid application.  I informed him that her our computer system, it shows he has [facility] ([facility] Medicaid. He said he was not aware of that. He then recalled receiving a letter from [facility] requesting he pick a PCP.  He said he will complete the form and send it back. He asked about how far back they go to pay his outstanding medical bills and I told him to call [facility] and inquire  He said he has the phone number for [facility] on the letter.  He was very appreciative of the call and said he still plans to call back to schedule his appointment with PCP after he receives his insurance card.

## 2022-03-18 DIAGNOSIS — Z419 Encounter for procedure for purposes other than remedying health state, unspecified: Secondary | ICD-10-CM | POA: Diagnosis not present

## 2022-03-25 ENCOUNTER — Other Ambulatory Visit (HOSPITAL_COMMUNITY): Payer: Self-pay

## 2022-03-30 ENCOUNTER — Other Ambulatory Visit (HOSPITAL_COMMUNITY): Payer: Self-pay

## 2022-03-31 ENCOUNTER — Other Ambulatory Visit (HOSPITAL_COMMUNITY): Payer: Self-pay

## 2022-04-17 DIAGNOSIS — Z419 Encounter for procedure for purposes other than remedying health state, unspecified: Secondary | ICD-10-CM | POA: Diagnosis not present

## 2022-05-18 DIAGNOSIS — Z419 Encounter for procedure for purposes other than remedying health state, unspecified: Secondary | ICD-10-CM | POA: Diagnosis not present

## 2022-05-25 ENCOUNTER — Ambulatory Visit: Payer: Medicaid Other | Admitting: Nurse Practitioner

## 2022-06-17 DIAGNOSIS — Z419 Encounter for procedure for purposes other than remedying health state, unspecified: Secondary | ICD-10-CM | POA: Diagnosis not present

## 2022-07-18 DIAGNOSIS — Z419 Encounter for procedure for purposes other than remedying health state, unspecified: Secondary | ICD-10-CM | POA: Diagnosis not present

## 2022-07-25 ENCOUNTER — Ambulatory Visit: Payer: Medicaid Other | Admitting: Nurse Practitioner

## 2022-08-16 ENCOUNTER — Other Ambulatory Visit: Payer: Self-pay | Admitting: Pharmacist

## 2022-08-16 ENCOUNTER — Other Ambulatory Visit: Payer: Self-pay

## 2022-08-16 ENCOUNTER — Encounter: Payer: Self-pay | Admitting: Nurse Practitioner

## 2022-08-16 ENCOUNTER — Other Ambulatory Visit: Payer: Self-pay | Admitting: Nurse Practitioner

## 2022-08-16 ENCOUNTER — Ambulatory Visit: Payer: Medicaid Other | Attending: Nurse Practitioner | Admitting: Nurse Practitioner

## 2022-08-16 VITALS — BP 154/94 | HR 102 | Ht 73.0 in | Wt 224.6 lb

## 2022-08-16 DIAGNOSIS — I10 Essential (primary) hypertension: Secondary | ICD-10-CM

## 2022-08-16 DIAGNOSIS — I480 Paroxysmal atrial fibrillation: Secondary | ICD-10-CM

## 2022-08-16 DIAGNOSIS — Z720 Tobacco use: Secondary | ICD-10-CM

## 2022-08-16 DIAGNOSIS — Z0100 Encounter for examination of eyes and vision without abnormal findings: Secondary | ICD-10-CM | POA: Diagnosis not present

## 2022-08-16 DIAGNOSIS — I5022 Chronic systolic (congestive) heart failure: Secondary | ICD-10-CM

## 2022-08-16 DIAGNOSIS — E785 Hyperlipidemia, unspecified: Secondary | ICD-10-CM

## 2022-08-16 DIAGNOSIS — Z1211 Encounter for screening for malignant neoplasm of colon: Secondary | ICD-10-CM

## 2022-08-16 DIAGNOSIS — L989 Disorder of the skin and subcutaneous tissue, unspecified: Secondary | ICD-10-CM

## 2022-08-16 DIAGNOSIS — F17200 Nicotine dependence, unspecified, uncomplicated: Secondary | ICD-10-CM

## 2022-08-16 DIAGNOSIS — K219 Gastro-esophageal reflux disease without esophagitis: Secondary | ICD-10-CM | POA: Diagnosis not present

## 2022-08-16 DIAGNOSIS — D649 Anemia, unspecified: Secondary | ICD-10-CM

## 2022-08-16 MED ORDER — PANTOPRAZOLE SODIUM 40 MG PO TBEC
40.0000 mg | DELAYED_RELEASE_TABLET | Freq: Two times a day (BID) | ORAL | 2 refills | Status: DC
  Filled 2022-08-16: qty 180, 90d supply, fill #0
  Filled 2022-11-14: qty 180, 90d supply, fill #1
  Filled 2023-02-27: qty 180, 90d supply, fill #2
  Filled 2023-02-28: qty 60, 30d supply, fill #2
  Filled 2023-03-21 (×2): qty 60, 30d supply, fill #3
  Filled 2023-05-25: qty 60, 30d supply, fill #4

## 2022-08-16 MED ORDER — BUDESONIDE-FORMOTEROL FUMARATE 160-4.5 MCG/ACT IN AERO
2.0000 | INHALATION_SPRAY | Freq: Two times a day (BID) | RESPIRATORY_TRACT | 3 refills | Status: DC
  Filled 2022-08-16: qty 10.2, 30d supply, fill #0
  Filled 2022-10-11: qty 10.2, 30d supply, fill #1
  Filled 2022-12-27: qty 10.2, 30d supply, fill #2
  Filled 2023-02-09: qty 10.2, 30d supply, fill #3

## 2022-08-16 MED ORDER — AMIODARONE HCL 200 MG PO TABS
100.0000 mg | ORAL_TABLET | Freq: Every day | ORAL | 2 refills | Status: DC
Start: 2022-08-16 — End: 2022-08-16
  Filled 2022-08-16: qty 15, 30d supply, fill #0

## 2022-08-16 MED ORDER — METOPROLOL SUCCINATE ER 25 MG PO TB24
25.0000 mg | ORAL_TABLET | Freq: Every day | ORAL | 1 refills | Status: DC
  Filled 2022-08-16: qty 90, 90d supply, fill #0
  Filled 2022-11-14: qty 90, 90d supply, fill #1

## 2022-08-16 MED ORDER — APIXABAN 5 MG PO TABS
5.0000 mg | ORAL_TABLET | Freq: Two times a day (BID) | ORAL | 1 refills | Status: DC
  Filled 2022-08-16: qty 180, 90d supply, fill #0

## 2022-08-16 MED ORDER — AMIODARONE HCL 200 MG PO TABS
100.0000 mg | ORAL_TABLET | Freq: Every day | ORAL | 1 refills | Status: DC
  Filled 2022-08-16: qty 45, 90d supply, fill #0

## 2022-08-16 NOTE — Progress Notes (Signed)
Assessment & Plan:  Luis Marquez was seen today for hypertension.  Diagnoses and all orders for this visit:  Primary Hypertension Continue all antihypertensives as prescribed.  Reminded to bring in blood pressure log for follow  up appointment.  RECOMMENDATIONS: DASH/Mediterranean Diets are healthier choices for HTN.    Chronic systolic CHF (congestive heart failure) (HCC) -     metoprolol succinate (TOPROL-XL) 25 MG 24 hr tablet; Take 1 tablet (25 mg total) by mouth daily. -     amiodarone (PACERONE) 200 MG tablet; Take 0.5 tablets (100 mg total) by mouth daily. -     CMP14+EGFR -     Ambulatory referral to Cardiology  Paroxysmal atrial fibrillation ([place]) -     apixaban (ELIQUIS) 5 MG TABS tablet; Take 1 tablet (5 mg total) by mouth 2 (two) times daily. -     metoprolol succinate (TOPROL-XL) 25 MG 24 hr tablet; Take 1 tablet (25 mg total) by mouth daily. -     amiodarone (PACERONE) 200 MG tablet; Take 0.5 tablets (100 mg total) by mouth daily. -     Ambulatory referral to Cardiology  Skin lesion of scalp -     Ambulatory referral to Dermatology  Routine eye exam -     Ambulatory referral to Ophthalmology  Dyslipidemia, goal LDL below 100 -     Lipid panel  Anemia, unspecified type -     CBC with Differential  Colon cancer screening -     Ambulatory referral to Gastroenterology  GERD without esophagitis -     pantoprazole (PROTONIX) 40 MG tablet; Take 1 tablet (40 mg total) by mouth 2 (two) times daily. INSTRUCTIONS: Avoid GERD Triggers: acidic, spicy or fried foods, caffeine, coffee, sodas,  alcohol and chocolate.     Patient has been counseled on age-appropriate routine health concerns for screening and prevention. These are reviewed and up-to-date. Referrals have been placed accordingly. Immunizations are up-to-date or declined.    Subjective:   Chief Complaint  Patient presents with   Hypertension   [name] 62 y.o. male presents to office today for follow up  to HTN  He has a history of HTN, chronic Right hip pain, NICM with EF (followed by Cardiology), PAF (Followed by Cardiology), hypocalcemia, COPD,    He is overdue for follow up with Cardiology    HTN Blood pressure is elevated today. He is currently taking amiodarone 100 mg daily and toprol XL 25 mg daily.  No longer taking furosemide. Notes normal readings at home. Entresto and spironolactone were dc'd by cardiology. He does continue to smoke.  BP Readings from Last 3 Encounters:  08/16/22 (!) 154/94  02/25/22 119/83  12/20/21 (!) 142/86    COPD Symptoms are well controlled. He is taking BREO daily with infrequent use of his prn ventolin inhaler  DERM He has 2 macular Skin lesions: one on the scalp and the other on the left side of his cheek.        Review of Systems  Constitutional:  Negative for fever, malaise/fatigue and weight loss.  HENT: Negative.  Negative for nosebleeds.   Eyes: Negative.  Negative for blurred vision, double vision and photophobia.  Respiratory: Negative.  Negative for cough and shortness of breath.   Cardiovascular: Negative.  Negative for chest pain, palpitations and leg swelling.  Gastrointestinal: Negative.  Negative for heartburn, nausea and vomiting.  Musculoskeletal:  Positive for myalgias.  Skin:        SEE HPI  Neurological: Negative.  Negative for dizziness, focal weakness, seizures and headaches.  Psychiatric/Behavioral: Negative.  Negative for suicidal ideas.     Past Medical History:  Diagnosis Date   Delirium tremens ([place]) 11/11/2014   Fracture, intertrochanteric, right femur ([place]) 11/11/2014   Hypertension    Laceration of head 11/03/2021   Syncope 12/07/2021    Past Surgical History:  Procedure Laterality Date   BIOPSY  12/10/2021   Procedure: BIOPSY;  Surgeon: Mauri Pole, MD;  Location: [facility];  Service: Gastroenterology;;   ESOPHAGOGASTRODUODENOSCOPY (EGD) WITH PROPOFOL N/A 12/10/2021   Procedure:  ESOPHAGOGASTRODUODENOSCOPY (EGD) WITH PROPOFOL;  Surgeon: Mauri Pole, MD;  Location: [facility];  Service: Gastroenterology;  Laterality: N/A;   HEMOSTASIS CLIP PLACEMENT  12/10/2021   Procedure: HEMOSTASIS CLIP PLACEMENT;  Surgeon: Mauri Pole, MD;  Location: [facility];  Service: Gastroenterology;;   HERNIA REPAIR     INTRAMEDULLARY (IM) NAIL INTERTROCHANTERIC Right 11/11/2014   Procedure: INTRAMEDULLARY (IM) NAIL INTERTROCHANTRIC;  Surgeon: Paralee Cancel, MD;  Location: [facility];  Service: Orthopedics;  Laterality: Right;   RIGHT/LEFT HEART CATH AND CORONARY ANGIOGRAPHY N/A 11/11/2021   Procedure: RIGHT/LEFT HEART CATH AND CORONARY ANGIOGRAPHY;  Surgeon: Jolaine Artist, MD;  Location: [facility];  Service: Cardiovascular;  Laterality: N/A;   Rod right leg      Family History  Problem Relation Age of Onset   Diabetes Mother     Social History Reviewed with no changes to be made today.   Outpatient Medications Prior to Visit  Medication Sig Dispense Refill   acetaminophen (TYLENOL) 325 MG tablet Take 2 tablets (650 mg total) by mouth every 6 (six) hours as needed for mild pain (or Fever >/= 101). 30 tablet 0   amiodarone (PACERONE) 200 MG tablet Take 0.5 tablets (100 mg total) by mouth daily. (Patient not taking: Reported on 08/16/2022) 15 tablet 2   apixaban (ELIQUIS) 5 MG TABS tablet Take 1 tablet (5 mg total) by mouth 2 (two) times daily. (Patient not taking: Reported on 08/16/2022) 60 tablet 2   folic acid (FOLVITE) 1 MG tablet Take 1 tablet (1 mg total) by mouth daily. (Patient not taking: Reported on 08/16/2022) 30 tablet 2   metoprolol succinate (TOPROL-XL) 25 MG 24 hr tablet Take 1 tablet (25 mg total) by mouth daily. (Patient not taking: Reported on 08/16/2022) 30 tablet 2   Multiple Vitamin (MULTIVITAMIN WITH MINERALS) TABS tablet Take 1 tablet by mouth daily. (Patient not taking: Reported on 08/16/2022) 30 tablet 2   pantoprazole (PROTONIX) 40 MG tablet  Take 1 tablet (40 mg total) by mouth 2 (two) times daily. (Patient not taking: Reported on 08/16/2022) 60 tablet 2   thiamine (VITAMIN B1) 100 MG tablet Take 1 tablet (100 mg total) by mouth daily. (Patient not taking: Reported on 08/16/2022) 30 tablet 2   No facility-administered medications prior to visit.    No Known Allergies     Objective:    BP (!) 154/94   Pulse (!) 102   Ht 6\' 1"  (1.854 m)   Wt 224 lb 9.6 oz (101.9 kg)   SpO2 96%   BMI 29.63 kg/m  Wt Readings from Last 3 Encounters:  08/16/22 224 lb 9.6 oz (101.9 kg)  02/25/22 196 lb 6.4 oz (89.1 kg)  12/20/21 187 lb 3.2 oz (84.9 kg)    Physical Exam Vitals and nursing note reviewed.  Constitutional:      Appearance: He is well-developed.  HENT:     Head: Normocephalic and atraumatic.  Cardiovascular:     Rate and Rhythm: Normal rate and regular rhythm.     Heart sounds: Normal heart sounds. No murmur heard.    No friction rub. No gallop.  Pulmonary:     Effort: Pulmonary effort is normal. No tachypnea or respiratory distress.     Breath sounds: Normal breath sounds. No decreased breath sounds, wheezing, rhonchi or rales.  Chest:     Chest wall: No tenderness.  Abdominal:     General: Bowel sounds are normal.     Palpations: Abdomen is soft.  Musculoskeletal:        General: Normal range of motion.     Cervical back: Normal range of motion.  Skin:    General: Skin is warm and dry.  Neurological:     Mental Status: He is alert and oriented to person, place, and time.     Coordination: Coordination normal.  Psychiatric:        Behavior: Behavior normal. Behavior is cooperative.        Thought Content: Thought content normal.        Judgment: Judgment normal.          Patient has been counseled extensively about nutrition and exercise as well as the importance of adherence with medications and regular follow-up. The patient was given clear instructions to go to ER or return to medical center if symptoms  don't improve, worsen or new problems develop. The patient verbalized understanding.   Follow-up: Return in about 3 months (around 11/15/2022).   Gildardo Pounds, FNP-BC [facility] [place], [place]   08/19/2022, 9:10 PM

## 2022-08-16 NOTE — Patient Instructions (Signed)
Luis Marquez. Bensimhon, MD Cardiologist in [place], [place] Address: [address] [place], [place], [place] [address] Phone: [phone]

## 2022-08-17 ENCOUNTER — Other Ambulatory Visit: Payer: Self-pay

## 2022-08-17 LAB — CBC WITH DIFFERENTIAL/PLATELET
Basophils Absolute: 0 10*3/uL (ref 0.0–0.2)
Basos: 1 %
EOS (ABSOLUTE): 0.2 10*3/uL (ref 0.0–0.4)
Eos: 2 %
Hematocrit: 42.4 % (ref 37.5–51.0)
Hemoglobin: 14.4 g/dL (ref 13.0–17.7)
Immature Grans (Abs): 0 10*3/uL (ref 0.0–0.1)
Immature Granulocytes: 0 %
Lymphocytes Absolute: 1.1 10*3/uL (ref 0.7–3.1)
Lymphs: 15 %
MCH: 34 pg — ABNORMAL HIGH (ref 26.6–33.0)
MCHC: 34 g/dL (ref 31.5–35.7)
MCV: 100 fL — ABNORMAL HIGH (ref 79–97)
Monocytes Absolute: 0.4 10*3/uL (ref 0.1–0.9)
Monocytes: 6 %
Neutrophils Absolute: 5.6 10*3/uL (ref 1.4–7.0)
Neutrophils: 76 %
Platelets: 253 10*3/uL (ref 150–450)
RBC: 4.23 x10E6/uL (ref 4.14–5.80)
RDW: 13.5 % (ref 11.6–15.4)
WBC: 7.3 10*3/uL (ref 3.4–10.8)

## 2022-08-17 LAB — CMP14+EGFR
ALT: 19 IU/L (ref 0–44)
AST: 39 IU/L (ref 0–40)
Albumin/Globulin Ratio: 1.3 (ref 1.2–2.2)
Albumin: 3.7 g/dL — ABNORMAL LOW (ref 3.9–4.9)
Alkaline Phosphatase: 134 IU/L — ABNORMAL HIGH (ref 44–121)
BUN/Creatinine Ratio: 9 — ABNORMAL LOW (ref 10–24)
BUN: 7 mg/dL — ABNORMAL LOW (ref 8–27)
Bilirubin Total: 0.6 mg/dL (ref 0.0–1.2)
CO2: 24 mmol/L (ref 20–29)
Calcium: 9.4 mg/dL (ref 8.6–10.2)
Chloride: 101 mmol/L (ref 96–106)
Creatinine, Ser: 0.78 mg/dL (ref 0.76–1.27)
Globulin, Total: 2.8 g/dL (ref 1.5–4.5)
Glucose: 95 mg/dL (ref 70–99)
Potassium: 3.7 mmol/L (ref 3.5–5.2)
Sodium: 145 mmol/L — ABNORMAL HIGH (ref 134–144)
Total Protein: 6.5 g/dL (ref 6.0–8.5)
eGFR: 100 mL/min/{1.73_m2} (ref >59)

## 2022-08-17 LAB — LIPID PANEL
Chol/HDL Ratio: 2.9 ratio (ref 0.0–5.0)
Cholesterol, Total: 178 mg/dL (ref 100–199)
HDL: 62 mg/dL (ref >39)
LDL Chol Calc (NIH): 97 mg/dL (ref 0–99)
Triglycerides: 105 mg/dL (ref 0–149)
VLDL Cholesterol Cal: 19 mg/dL (ref 5–40)

## 2022-08-17 NOTE — Telephone Encounter (Signed)
Albuterol inhaler refill request refused because it was discontinued 12/10/2021.

## 2022-08-18 DIAGNOSIS — Z419 Encounter for procedure for purposes other than remedying health state, unspecified: Secondary | ICD-10-CM | POA: Diagnosis not present

## 2022-08-19 ENCOUNTER — Encounter: Payer: Self-pay | Admitting: Gastroenterology

## 2022-08-19 ENCOUNTER — Encounter: Payer: Self-pay | Admitting: Nurse Practitioner

## 2022-08-19 ENCOUNTER — Other Ambulatory Visit: Payer: Self-pay

## 2022-08-19 ENCOUNTER — Ambulatory Visit: Payer: Self-pay | Admitting: *Deleted

## 2022-08-19 DIAGNOSIS — M1611 Unilateral primary osteoarthritis, right hip: Secondary | ICD-10-CM | POA: Diagnosis not present

## 2022-08-19 MED ORDER — ALBUTEROL SULFATE HFA 108 (90 BASE) MCG/ACT IN AERS
2.0000 | INHALATION_SPRAY | Freq: Four times a day (QID) | RESPIRATORY_TRACT | 1 refills | Status: DC | PRN
  Filled 2022-08-19: qty 18, 25d supply, fill #0

## 2022-08-19 NOTE — Telephone Encounter (Signed)
Reason for Disposition  [1] Follow-up call to recent contact AND [2] information only call, no triage required  Answer Assessment - Initial Assessment Questions 1. REASON FOR CALL or QUESTION: "What is your reason for calling today?" or "How can I best help you?" or "What question do you have that I can help answer?"     Pt called in and was given his lab result message from Geryl Rankins, NP dated 08/17/2022 at 9:10 PM.  He asked about his Breo inhaler.    I checked and it has been discontinued and started on Symbicort inhaler in its place.   He verbalized understanding.  He also asked about his albuterol inhaler.   I let him know it was discontinued.  He thanked me for my help.  No questions regarding his lab results.  Protocols used: Information Only Call - No Triage-A-AH

## 2022-08-19 NOTE — Telephone Encounter (Signed)
  Chief Complaint: Called in for lab results. Symptoms: N/A Frequency: N/A Pertinent Negatives: Patient denies N/A Disposition: [] ED /[] Urgent Care (no appt availability in office) / [] Appointment(In office/virtual)/ []  [place] [facility]/ [x] Home Care/ [] Refused Recommended Disposition /[] [place] [facility]/ []  Follow-up with PCP Additional Notes: Lab result message from Geryl Rankins, NP read to him.

## 2022-08-22 ENCOUNTER — Other Ambulatory Visit: Payer: Self-pay

## 2022-08-24 DIAGNOSIS — D485 Neoplasm of uncertain behavior of skin: Secondary | ICD-10-CM | POA: Diagnosis not present

## 2022-08-24 DIAGNOSIS — L570 Actinic keratosis: Secondary | ICD-10-CM | POA: Diagnosis not present

## 2022-09-08 ENCOUNTER — Encounter (HOSPITAL_COMMUNITY): Payer: Self-pay | Admitting: Internal Medicine

## 2022-09-08 ENCOUNTER — Other Ambulatory Visit: Payer: Self-pay

## 2022-09-08 ENCOUNTER — Ambulatory Visit (HOSPITAL_COMMUNITY)
Admission: RE | Admit: 2022-09-08 | Discharge: 2022-09-08 | Disposition: A | Discharge status: Home / Self Care | Payer: Medicaid Other | Source: Ambulatory Visit | Attending: Internal Medicine | Admitting: Internal Medicine

## 2022-09-08 VITALS — BP 150/90 | HR 88 | Wt 221.0 lb

## 2022-09-08 DIAGNOSIS — F1010 Alcohol abuse, uncomplicated: Secondary | ICD-10-CM

## 2022-09-08 DIAGNOSIS — Z7951 Long term (current) use of inhaled steroids: Secondary | ICD-10-CM | POA: Diagnosis not present

## 2022-09-08 DIAGNOSIS — Z79899 Other long term (current) drug therapy: Secondary | ICD-10-CM | POA: Insufficient documentation

## 2022-09-08 DIAGNOSIS — J439 Emphysema, unspecified: Secondary | ICD-10-CM | POA: Insufficient documentation

## 2022-09-08 DIAGNOSIS — I110 Hypertensive heart disease with heart failure: Secondary | ICD-10-CM | POA: Diagnosis not present

## 2022-09-08 DIAGNOSIS — Z7901 Long term (current) use of anticoagulants: Secondary | ICD-10-CM | POA: Diagnosis not present

## 2022-09-08 DIAGNOSIS — I4891 Unspecified atrial fibrillation: Secondary | ICD-10-CM | POA: Diagnosis not present

## 2022-09-08 DIAGNOSIS — I480 Paroxysmal atrial fibrillation: Secondary | ICD-10-CM | POA: Diagnosis not present

## 2022-09-08 DIAGNOSIS — R0602 Shortness of breath: Secondary | ICD-10-CM | POA: Insufficient documentation

## 2022-09-08 DIAGNOSIS — I2510 Atherosclerotic heart disease of native coronary artery without angina pectoris: Secondary | ICD-10-CM | POA: Insufficient documentation

## 2022-09-08 DIAGNOSIS — I5022 Chronic systolic (congestive) heart failure: Secondary | ICD-10-CM | POA: Diagnosis not present

## 2022-09-08 DIAGNOSIS — Z720 Tobacco use: Secondary | ICD-10-CM | POA: Diagnosis not present

## 2022-09-08 DIAGNOSIS — M25551 Pain in right hip: Secondary | ICD-10-CM | POA: Diagnosis not present

## 2022-09-08 DIAGNOSIS — I428 Other cardiomyopathies: Secondary | ICD-10-CM | POA: Diagnosis not present

## 2022-09-08 DIAGNOSIS — F17210 Nicotine dependence, cigarettes, uncomplicated: Secondary | ICD-10-CM | POA: Diagnosis not present

## 2022-09-08 MED ORDER — LOSARTAN POTASSIUM 25 MG PO TABS
25.0000 mg | ORAL_TABLET | Freq: Every day | ORAL | 3 refills | Status: DC
  Filled 2022-09-08: qty 90, 90d supply, fill #0

## 2022-09-08 NOTE — Progress Notes (Signed)
ADVANCED HF CLINIC  NOTE  Primary Care: Gildardo Pounds, NP HF Cardiologist: Dr. Haroldine Laws  HPI: Mr Luis Marquez is a 62 y.o. with HTN, emphysema, ETOH abuse, tobacco abuse, and systolic HF.    Admitted 5/23 after a fall (? Syncope) and found to have elevated calcium. Treated with IV fluids and IV zometa. Found to be in AF with RVR, started on diltiazem drip. Echo showed EF 20-25% RV mildly reduced. Treated with amio. Echo EF 25%.with possible Takotusbo. Underwent R/LHC showing minimal CAD, normal filling pressures. cMRI showed improving LVEF 42%, evidence of potential Tako-tsubo CM, RV normal. Amio weaned off and GDMT titrated. Hospitalization c/b ETOH withdrawal/delirium and hypercalcemia. He was discharged home, weight 197 lbs.  Post hospital follow up 5/23, NYHA II, on the dry side volume wise. Lasix changed to PRN.  Admitted 5/23 with syncope, felt 2/2 hypotension and orthostasis. Ethanol level 116. Arlyce Harman and [place] held, and received IVF. With h/o ETOH use, unintentional weight loss, hypercalcemia, and esophageal thickening on CT, GI was consulted. He underwent EGD, showing gastritis. Started on PPI and discharged home, weight 186 lbs.  Admitted 8/23 with symptomatic tachycardia and found to have profound electrolyte abnormalities in the ED (hypokalemia @ 2.0 and hypomag at 1.0).  We have not seen him since 6/23. He says he wasn't taking any heart meds until 1/24. Saw his PCP and heart meds refilled. Says he is feeling better now. Main complaint is right hip pain (wants to get it replaced by Dr. Alvan Dame). Says he gets SOB with long walks. No CP, edema, orthopnea or PND. Drinks 5 beers 1X/week. Smokes a few cigs per week. Lives in an extended stay motel.   I did echo today at bedside EF 60-65%  Cardiac Studies:  - Echo (5/23): EF 25%, mid and apical hypokinesis with sparing of base, RV mildly reduced. Echo concerning for Takotsubo  - cMRI (5/23): LVEF 42%  evidence of potential Tako-tsubo  CM  RV normal   - R/LHC  (5/23):   Prox Cx lesion is 20% stenosed.   Mid LAD lesion is 30% stenosed.   The left ventricular ejection fraction is 25-35% by visual estimate.    Ao = 119/68 (90) LV = 119/17 RA =  3 RV = 27/6 PA = 25/9 (15) PCW = 10 Fick cardiac output/index = 8.2/3.7 PVR = 0.6 WU Ao sat = 94% PA sat = 72%, 73% SVC sat = 74%    Past Medical History:  Diagnosis Date   Delirium tremens ([place]) 11/11/2014   Fracture, intertrochanteric, right femur ([place]) 11/11/2014   Hypertension    Laceration of head 11/03/2021   Syncope 12/07/2021   Current Outpatient Medications  Medication Sig Dispense Refill   albuterol (VENTOLIN HFA) 108 (90 Base) MCG/ACT inhaler Inhale 2 puffs into the lungs every 6 (six) hours as needed for wheezing or shortness of breath. 18 g 1   amiodarone (PACERONE) 200 MG tablet Take 0.5 tablets (100 mg total) by mouth daily. 45 tablet 1   apixaban (ELIQUIS) 5 MG TABS tablet Take 1 tablet (5 mg total) by mouth 2 (two) times daily. 180 tablet 1   budesonide-formoterol (SYMBICORT) 160-4.5 MCG/ACT inhaler Inhale 2 puffs into the lungs 2 (two) times daily. 10.2 g 3   metoprolol succinate (TOPROL-XL) 25 MG 24 hr tablet Take 1 tablet (25 mg total) by mouth daily. 90 tablet 1   pantoprazole (PROTONIX) 40 MG tablet Take 1 tablet (40 mg total) by mouth 2 (two) times daily. [place]  tablet 2   No current facility-administered medications for this encounter.    No Known Allergies    Social History   Socioeconomic History   Marital status: Single    Spouse name: Not on file   Number of children: Not on file   Years of education: Not on file   Highest education level: Not on file  Occupational History   Not on file  Tobacco Use   Smoking status: Some Days    Packs/day: 0.25    Types: Cigarettes    Passive exposure: Never   Smokeless tobacco: Never  Vaping Use   Vaping Use: Never used  Substance and Sexual Activity   Alcohol use: Not Currently   Drug use:  Not Currently   Sexual activity: Yes  Other Topics Concern   Not on file  Social History Narrative   ** Merged History Encounter **       Social Determinants of Health   Financial Resource Strain: Not on file  Food Insecurity: Not on file  Transportation Needs: Not on file  Physical Activity: Not on file  Stress: Not on file  Social Connections: Not on file  Intimate Partner Violence: Not on file   Family History  Problem Relation Age of Onset   Diabetes Mother    BP (!) 150/90   Pulse 88   Wt 100.2 kg (221 lb)   SpO2 95%   BMI 29.16 kg/m   Wt Readings from Last 3 Encounters:  09/08/22 100.2 kg (221 lb)  08/16/22 101.9 kg (224 lb 9.6 oz)  02/25/22 89.1 kg (196 lb 6.4 oz)   PHYSICAL EXAM: General:  Well appearing. No resp difficulty HEENT: normal Neck: supple. no JVD. Carotids 2+ bilat; no bruits. No lymphadenopathy or thryomegaly appreciated. Cor: PMI nondisplaced. Regular rate & rhythm. No rubs, gallops or murmurs. Lungs: clear Abdomen: soft, nontender, nondistended. No hepatosplenomegaly. No bruits or masses. Good bowel sounds. Extremities: no cyanosis, clubbing, rash, edema Neuro: alert & orientedx3, cranial nerves grossly intact. moves all 4 extremities w/o difficulty. Affect pleasant   ECG: NSR rBBB 81 bpm  (personally reviewed)  ASSESSMENT & PLAN: 1.  Chronic Systolic Heart Failure: - Onset 5/23 - Echo (5/23): EF 25%, mid and apical hypokinesis with sparing of base, RV mildly reduced, concerning for Takotsubo.  - R/LHC (5/23): minimal CAD, normal filling pressures.  - NICM. ETOH abuse/HTN maybe contributing.  - cMRI (5/23): LVEF improving 42%, evidence of potential Takotsubo CM, RV normal - I did echo today 09/08/22 at bedside EF 60-65% (after being off meds for > 1 year) - NYHA I. Volume status ok  - Start losartan 25 mg daily for HTN - Continue Toprol XL 25 mg daily. - Continue Lasix 20 PRN.  2. A Fib  - Developed A fib RVR 4/23 admit.  - NSR on  ECG today. Was off amio > 1 year. Restarted in 1/24 by PCP. Can stop today. If having more palpitations place Zio - Continue Eliquis 5 mg bid.  - Stop amio    3. H/o Hypercalcemia  - Per PCP   4. ETOH abuse - In Rehab 10 years ago for ETOH abuse.  - Previously drank 20 beers every 2 days. - Back drinking, but not as heavy. Discussed cessation   5. Tobacco Abuse - Heavy smoker for many years.  - Now just a few per week - Discussed cessation  Glori Bickers, MD  4:07 PM

## 2022-09-08 NOTE — Patient Instructions (Signed)
CONGRATULATIONS YOU HAVE GRADUATED FROM THE HEART FAILURE CLINIC.  STOP Amiodarone  START Losartan 25 mg daily.  Your provider wants you to follow up with General Cardiology in  6 Months.  If you have any questions or concerns before your next appointment please send [place] a message through [place] or call our office at [phone].    TO LEAVE A MESSAGE FOR THE NURSE SELECT OPTION 2, PLEASE LEAVE A MESSAGE INCLUDING: YOUR NAME DATE OF BIRTH CALL BACK NUMBER REASON FOR CALL**this is important as we prioritize the call backs  YOU WILL RECEIVE A CALL BACK THE SAME DAY AS LONG AS YOU CALL BEFORE 4:00 PM  At the [facility], you and your health needs are our priority. As part of our continuing mission to provide you with exceptional heart care, we have created designated Provider Care Teams. These Care Teams include your primary Cardiologist (physician) and Advanced Practice Providers (APPs- Physician Assistants and Nurse Practitioners) who all work together to provide you with the care you need, when you need it.   You may see any of the following providers on your designated Care Team at your next follow up: Dr Glori Bickers Dr Loralie Champagne Dr. Roxana Hires, NP Lyda Jester, [facility] [place], [place] Forest[place] Na, NP Audry Riles, PharmD   Please be sure to bring in all your medications bottles to every appointment.    Thank you for choosing [facility]

## 2022-09-12 ENCOUNTER — Other Ambulatory Visit: Payer: Self-pay

## 2022-09-16 DIAGNOSIS — Z419 Encounter for procedure for purposes other than remedying health state, unspecified: Secondary | ICD-10-CM | POA: Diagnosis not present

## 2022-09-20 ENCOUNTER — Ambulatory Visit (INDEPENDENT_AMBULATORY_CARE_PROVIDER_SITE_OTHER): Payer: Medicaid Other | Admitting: Gastroenterology

## 2022-09-20 ENCOUNTER — Telehealth: Payer: Self-pay

## 2022-09-20 ENCOUNTER — Other Ambulatory Visit (HOSPITAL_COMMUNITY): Payer: Self-pay

## 2022-09-20 ENCOUNTER — Telehealth (HOSPITAL_COMMUNITY): Payer: Self-pay

## 2022-09-20 ENCOUNTER — Other Ambulatory Visit: Payer: Self-pay

## 2022-09-20 ENCOUNTER — Encounter: Payer: Self-pay | Admitting: Gastroenterology

## 2022-09-20 VITALS — BP 136/68 | HR 98 | Ht 73.0 in | Wt 236.0 lb

## 2022-09-20 DIAGNOSIS — I5022 Chronic systolic (congestive) heart failure: Secondary | ICD-10-CM

## 2022-09-20 DIAGNOSIS — Z1211 Encounter for screening for malignant neoplasm of colon: Secondary | ICD-10-CM

## 2022-09-20 DIAGNOSIS — K2100 Gastro-esophageal reflux disease with esophagitis, without bleeding: Secondary | ICD-10-CM

## 2022-09-20 DIAGNOSIS — Z7901 Long term (current) use of anticoagulants: Secondary | ICD-10-CM | POA: Diagnosis not present

## 2022-09-20 DIAGNOSIS — K209 Esophagitis, unspecified without bleeding: Secondary | ICD-10-CM | POA: Insufficient documentation

## 2022-09-20 MED ORDER — PLENVU 140 G PO SOLR
1.0000 | ORAL | 0 refills | Status: DC
  Filled 2022-09-20: qty 3, 1d supply, fill #0

## 2022-09-20 NOTE — Telephone Encounter (Signed)
Patient with diagnosis of afib on Eliquis for anticoagulation.    Procedure: EGD and colonoscopy Date of procedure: 10/26/22  CHA2DS2-VASc Score = 2  This indicates a 2.2% annual risk of stroke. The patient's score is based upon: CHF History: 1 HTN History: 1 Diabetes History: 0 Stroke History: 0 Vascular Disease History: 0 Age Score: 0 Gender Score: 0   CrCl >138m/min Platelet count 253K  Per office protocol, patient can hold Eliquis for 2 days prior to procedure as requested.    **This guidance is not considered finalized until pre-operative APP has relayed final recommendations.**

## 2022-09-20 NOTE — Telephone Encounter (Signed)
Patient called and states his BP is still in the 150-160 range and wanted to know if he can increase his Losartan, or does he need to do something different? Please advise

## 2022-09-20 NOTE — Telephone Encounter (Signed)
Informed patient and scheduled follow up labs

## 2022-09-20 NOTE — Patient Instructions (Addendum)
You have been scheduled for an endoscopy and colonoscopy. Please follow the written instructions given to you at your visit today. Please pick up your prep supplies at the pharmacy within the next 1-3 days. If you use inhalers (even only as needed), please bring them with you on the day of your procedure.  You will be contacted by our office prior to your procedure for directions on holding your Eliquis.  If you do not hear from our office 1 week prior to your scheduled procedure, please call [phone] to discuss.    Thank you for entrusting me with your care and for choosing [employer], Alonza Bogus, P.A. - C.     If your blood pressure at your visit was 140/90 or greater, please contact your primary care physician to follow up on this.  _______________________________________________________  If you are age 46 or older, your body mass index should be between 23-30. Your Body mass index is 31.14 kg/m. If this is out of the aforementioned range listed, please consider follow up with your Primary Care Provider.  If you are age 28 or younger, your body mass index should be between 19-25. Your Body mass index is 31.14 kg/m. If this is out of the aformentioned range listed, please consider follow up with your Primary Care Provider.   ________________________________________________________  The [facility] providers would like to encourage you to use [facility] to communicate with providers for non-urgent requests or questions.  Due to long hold times on the telephone, sending your provider a message by [facility] may be a faster and more efficient way to get a response.  Please allow 48 business hours for a response.  Please remember that this is for non-urgent requests.  _______________________________________________________  Due to recent changes in healthcare laws, you may see the results of your imaging and laboratory studies on MyChart before your provider has had a chance to review  them.  We understand that in some cases there may be results that are confusing or concerning to you. Not all laboratory results come back in the same time frame and the provider may be waiting for multiple results in order to interpret others.  Please give [place] 48 hours in order for your provider to thoroughly review all the results before contacting the office for clarification of your results.

## 2022-09-20 NOTE — Progress Notes (Signed)
09/20/2022 Luis Marquez [id number] 09/23/1960   HISTORY OF PRESENT ILLNESS: This is a 62 year old male who is here today to discuss and schedule colonoscopy.  Tells me he had a colonoscopy in 2014 or 2015 in [place], [place].  He thinks he had a polyp removed, has not had a colonoscopy since then.  We do not have records.  He was seen by our practice, Dr. Silverio Decamp, during hospitalization back in May 2023 for he had some dysphagia and abnormal CT scan of the esophagus.  EGD showed grade D esophagitis.  Biopsy results as follows:  A. STOMACH, BIOPSY:  - Gastric antral and oxyntic mucosa with mild nonspecific reactive  gastropathy  - Helicobacter pylori-like organisms are not identified on routine HE  stain   B. ESOPHAGUS, BIOPSY:  - Esophageal squamous and cardiac mucosa with ulceration and associated  granulation tissue  - Negative for intestinal metaplasia, dysplasia or carcinoma  - Viral cytopathic changes are not identified   It was recommended that he have repeat EGD in a few months to assess healing after high-dose PPI therapy.  He never followed up after that.  He says that he lost his insurance for a while.  He was in fact he was off of his PPI for a while and was having heartburn and reflux with everything he ate.  Now he has insurance and has been back on his pantoprazole 40 mg twice daily since January 30.  He has had no heartburn or reflux issues at all since restarting the medication.  He said he has actually gained a lot of weight since he has been out of work.  He is waiting to get his right hip replaced.  Otherwise he moves his bowels pretty regularly, no rectal bleeding.  He is on Eliquis for history of atrial fibrillation.   Past Medical History:  Diagnosis Date   Chronic systolic heart failure (HCC)    Delirium tremens ([place]) 11/11/2014   Emphysema, unspecified ([place])    ETOH abuse    Fracture, intertrochanteric, right femur ([place]) 11/11/2014   Hypertension     Hypomagnesemia    Laceration of head 11/03/2021   Paroxysmal A-fib ([place])    Syncope 12/07/2021   Tobacco abuse    Past Surgical History:  Procedure Laterality Date   BIOPSY  12/10/2021   Procedure: BIOPSY;  Surgeon: Mauri Pole, MD;  [facility];  Service: Gastroenterology;;   ESOPHAGOGASTRODUODENOSCOPY (EGD) WITH PROPOFOL N/A 12/10/2021   Procedure: ESOPHAGOGASTRODUODENOSCOPY (EGD) WITH PROPOFOL;  Surgeon: Mauri Pole, MD;  Location: [facility];  Service: Gastroenterology;  Laterality: N/A;   HEMOSTASIS CLIP PLACEMENT  12/10/2021   Procedure: HEMOSTASIS CLIP PLACEMENT;  Surgeon: Mauri Pole, MD;  Location: [facility];  Service: Gastroenterology;;   HERNIA REPAIR     INTRAMEDULLARY (IM) NAIL INTERTROCHANTERIC Right 11/11/2014   Procedure: INTRAMEDULLARY (IM) NAIL INTERTROCHANTRIC;  Surgeon: Paralee Cancel, MD;  Location: [facility];  Service: Orthopedics;  Laterality: Right;   RIGHT/LEFT HEART CATH AND CORONARY ANGIOGRAPHY N/A 11/11/2021   Procedure: RIGHT/LEFT HEART CATH AND CORONARY ANGIOGRAPHY;  Surgeon: Jolaine Artist, MD;  Location: [facility];  Service: Cardiovascular;  Laterality: N/A;   Rod right leg      reports that he has been smoking cigarettes. He has been smoking an average of .25 packs per day. He has never been exposed to tobacco smoke. He has never used smokeless tobacco. He reports that he does not currently use alcohol. He reports that he  does not currently use drugs. family history includes Diabetes in his mother. No Known Allergies    Outpatient Encounter Medications as of 09/20/2022  Medication Sig   apixaban (ELIQUIS) 5 MG TABS tablet Take 1 tablet (5 mg total) by mouth 2 (two) times daily.   budesonide-formoterol (SYMBICORT) 160-4.5 MCG/ACT inhaler Inhale 2 puffs into the lungs 2 (two) times daily.   losartan (COZAAR) 25 MG tablet Take 1 tablet (25 mg total) by mouth daily. (Patient taking differently: Take 25 mg by mouth  daily. Pt taking 2 tablets a day)   pantoprazole (PROTONIX) 40 MG tablet Take 1 tablet (40 mg total) by mouth 2 (two) times daily.   albuterol (VENTOLIN HFA) 108 (90 Base) MCG/ACT inhaler Inhale 2 puffs into the lungs every 6 (six) hours as needed for wheezing or shortness of breath. (Patient not taking: Reported on 09/20/2022)   metoprolol succinate (TOPROL-XL) 25 MG 24 hr tablet Take 1 tablet (25 mg total) by mouth daily. (Patient not taking: Reported on 09/20/2022)   No facility-administered encounter medications on file as of 09/20/2022.     REVIEW OF SYSTEMS  : All other systems reviewed and negative except where noted in the History of Present Illness.   PHYSICAL EXAM: BP 136/68   Pulse 98   Ht '6\' 1"'$  (1.854 m)   Wt 236 lb (107 kg)   BMI 31.14 kg/m  General: Well developed white male in no acute distress Head: Normocephalic and atraumatic Eyes:  Sclerae anicteric, conjunctiva pink. Ears: Normal auditory acuity Lungs: Clear throughout to auscultation; no W/R/R. Heart: Regular rate and rhythm; no M/R/G. Abdomen: Soft, non-distended.  BS present.  Non-tender. Rectal:  Will be done at the time of colonoscopy. Musculoskeletal: Symmetrical with no gross deformities  Skin: No lesions on visible extremities Extremities: No edema  Neurological: Alert oriented x 4, grossly non-focal Psychological:  Alert and cooperative. Normal mood and affect  ASSESSMENT AND PLAN: *CRC screening:  Reports having a colonoscopy in 2014 or 2015 in [place], [place], thinks maybe he had a polyp removed.  No colonoscopy since that time.  We do not have records.  Will schedule colonoscopy with Dr. Silverio Decamp. *GERD and Grade D esophagitis:  Seen on previous EGD in 11/2021.  Never had repeat/follow-up EGD.  Symptoms much better on Pantoprazole 40 mg BID.  Will schedule EGD with Dr. Silverio Decamp. *Chronic anticoagulation for history of atrial fibrillation:  Will hold Eliqiuis for 2 days prior to endoscopic procedures - will  instruct when and how to resume after procedure. Benefits and risks of procedure explained including risks of bleeding, perforation, infection, missed lesions, reactions to medications and possible need for hospitalization and surgery for complications. Additional rare but real risk of stroke or other vascular clotting events off of Eliquis also explained and need to seek urgent help if any signs of these problems occur. Will communicate by phone or EMR with patient's prescribing provider, Dr. Haroldine Laws, to confirm that holding Eliquis is reasonable in this case.    CC:  Gildardo Pounds, NP

## 2022-09-20 NOTE — Telephone Encounter (Signed)
     Primary Cardiologist: None  Chart reviewed as part of pre-operative protocol coverage by clinical pharmacist. The following recommendations were made for  Luis Marquez :   Patient with diagnosis of afib on Eliquis for anticoagulation.     Procedure: EGD and colonoscopy Date of procedure: 10/26/22   CHA2DS2-VASc Score = 2  This indicates a 2.2% annual risk of stroke. The patient's score is based upon: CHF History: 1 HTN History: 1 Diabetes History: 0 Stroke History: 0 Vascular Disease History: 0 Age Score: 0 Gender Score: 0   CrCl >180m/min Platelet count 253K   Per office protocol, patient can hold Eliquis for 2 days prior to procedure as requested.    I will route this recommendation to the requesting party via Epic fax function and remove from pre-op pool.  Please call with questions.  JJossie Ng Jacci Ruberg NP-C    09/20/2022, 4:06 PM [place] Office [phone] [phone]

## 2022-09-20 NOTE — Telephone Encounter (Signed)
[facility] Pre-operative Risk Assessment     Request for surgical clearance:     Endoscopy Procedure  What type of surgery is being performed?     EGD  and Colonoscopy  When is this surgery scheduled?     10-26-22  What type of clearance is required ?   Pharmacy  Are there any medications that need to be held prior to surgery and how long? ELIQUIS 2 DAYS  Practice name and name of physician performing surgery?  DR. Harl Bowie  [facility]  What is your office phone and fax number?      Phone- [phone]  Fax- [phone] ATTN: Lemar Lofty, CMA  Anesthesia type (None, local, MAC, general) ?       MAC   THANK YOU

## 2022-09-21 NOTE — Telephone Encounter (Signed)
Called and spoke to patient.  He understands to hold Eliquis for 2 days prior to his procedure.

## 2022-09-22 ENCOUNTER — Other Ambulatory Visit: Payer: Self-pay

## 2022-09-30 ENCOUNTER — Ambulatory Visit (HOSPITAL_COMMUNITY)
Admission: RE | Admit: 2022-09-30 | Discharge: 2022-09-30 | Disposition: A | Discharge status: Home / Self Care | Payer: Medicaid Other | Source: Ambulatory Visit | Attending: Internal Medicine | Admitting: Internal Medicine

## 2022-09-30 DIAGNOSIS — I5022 Chronic systolic (congestive) heart failure: Secondary | ICD-10-CM | POA: Insufficient documentation

## 2022-09-30 LAB — BASIC METABOLIC PANEL
Anion gap: 15 (ref 5–15)
BUN: 6 mg/dL — ABNORMAL LOW (ref 8–23)
CO2: 20 mmol/L — ABNORMAL LOW (ref 22–32)
Calcium: 8.8 mg/dL — ABNORMAL LOW (ref 8.9–10.3)
Chloride: 102 mmol/L (ref 98–111)
Creatinine, Ser: 0.9 mg/dL (ref 0.61–1.24)
GFR, Estimated: >60 mL/min (ref >60)
Glucose, Bld: 156 mg/dL — ABNORMAL HIGH (ref 70–99)
Potassium: 3 mmol/L — ABNORMAL LOW (ref 3.5–5.1)
Sodium: 137 mmol/L (ref 135–145)

## 2022-10-06 ENCOUNTER — Other Ambulatory Visit (HOSPITAL_COMMUNITY): Payer: Self-pay

## 2022-10-10 ENCOUNTER — Encounter (HOSPITAL_COMMUNITY): Payer: Self-pay

## 2022-10-10 ENCOUNTER — Telehealth (HOSPITAL_COMMUNITY): Payer: Self-pay | Admitting: Cardiology

## 2022-10-10 ENCOUNTER — Other Ambulatory Visit: Payer: Self-pay

## 2022-10-10 DIAGNOSIS — I5022 Chronic systolic (congestive) heart failure: Secondary | ICD-10-CM

## 2022-10-10 MED ORDER — POTASSIUM CHLORIDE CRYS ER 20 MEQ PO TBCR
EXTENDED_RELEASE_TABLET | ORAL | 6 refills | Status: AC
  Filled 2022-10-10: qty 31, 30d supply, fill #0
  Filled 2022-11-14: qty 31, 31d supply, fill #1
  Filled 2022-12-27 (×2): qty 31, 31d supply, fill #2
  Filled 2023-02-03: qty 31, 31d supply, fill #3
  Filled 2023-03-21: qty 31, 31d supply, fill #4

## 2022-10-10 NOTE — Telephone Encounter (Signed)
Pt left Vm on triage line requesting lab results  Per DPR detailed message left on VM and script sent to pharmacy -pt to return call to schedule follow up labs

## 2022-10-11 ENCOUNTER — Other Ambulatory Visit: Payer: Self-pay | Admitting: Nurse Practitioner

## 2022-10-11 ENCOUNTER — Other Ambulatory Visit: Payer: Self-pay

## 2022-10-11 DIAGNOSIS — F17200 Nicotine dependence, unspecified, uncomplicated: Secondary | ICD-10-CM

## 2022-10-11 MED ORDER — ALBUTEROL SULFATE HFA 108 (90 BASE) MCG/ACT IN AERS
2.0000 | INHALATION_SPRAY | Freq: Four times a day (QID) | RESPIRATORY_TRACT | 0 refills | Status: DC | PRN
  Filled 2022-10-11: qty 18, 25d supply, fill #0

## 2022-10-12 ENCOUNTER — Other Ambulatory Visit: Payer: Self-pay

## 2022-10-17 DIAGNOSIS — Z419 Encounter for procedure for purposes other than remedying health state, unspecified: Secondary | ICD-10-CM | POA: Diagnosis not present

## 2022-10-18 ENCOUNTER — Ambulatory Visit (HOSPITAL_COMMUNITY)
Admission: RE | Admit: 2022-10-18 | Discharge: 2022-10-18 | Disposition: A | Discharge status: Home / Self Care | Payer: 59 | Source: Ambulatory Visit | Attending: Internal Medicine | Admitting: Internal Medicine

## 2022-10-18 ENCOUNTER — Telehealth (HOSPITAL_COMMUNITY): Payer: Self-pay

## 2022-10-18 DIAGNOSIS — I5022 Chronic systolic (congestive) heart failure: Secondary | ICD-10-CM | POA: Insufficient documentation

## 2022-10-18 LAB — BASIC METABOLIC PANEL
Anion gap: 12 (ref 5–15)
BUN: 6 mg/dL — ABNORMAL LOW (ref 8–23)
CO2: 26 mmol/L (ref 22–32)
Calcium: 8.4 mg/dL — ABNORMAL LOW (ref 8.9–10.3)
Chloride: 103 mmol/L (ref 98–111)
Creatinine, Ser: 0.96 mg/dL (ref 0.61–1.24)
GFR, Estimated: >60 mL/min (ref >60)
Glucose, Bld: 120 mg/dL — ABNORMAL HIGH (ref 70–99)
Potassium: 4.2 mmol/L (ref 3.5–5.1)
Sodium: 141 mmol/L (ref 135–145)

## 2022-10-18 NOTE — Telephone Encounter (Signed)
Patient wanted to let you know that his systolic blood pressure has still been averaging around the 0000000 and the diastolic is around 123456. Please advise.

## 2022-10-19 ENCOUNTER — Telehealth: Payer: Self-pay | Admitting: *Deleted

## 2022-10-19 NOTE — Telephone Encounter (Signed)
Dr. Silverio Decamp,   This pt's cardiac EF per echo is below the [place] standard.  His procedure will need to be done at the hospital.  Thanks,  Osvaldo Angst

## 2022-10-24 ENCOUNTER — Other Ambulatory Visit: Payer: Self-pay

## 2022-10-24 DIAGNOSIS — K2100 Gastro-esophageal reflux disease with esophagitis, without bleeding: Secondary | ICD-10-CM

## 2022-10-24 DIAGNOSIS — K209 Esophagitis, unspecified without bleeding: Secondary | ICD-10-CM

## 2022-10-24 DIAGNOSIS — Z1211 Encounter for screening for malignant neoplasm of colon: Secondary | ICD-10-CM

## 2022-10-24 NOTE — Telephone Encounter (Signed)
I have spoken to the Luis Marquez and he agrees to the appt at the hospital for endo colon.  His [facility] appt cancelled and new appt made for 5/13 at [facility] with Dr Lavon Paganini.  The Luis Marquez has the prep on hand. New instructions mailed and verbalized with Luis Marquez.

## 2022-10-24 NOTE — Telephone Encounter (Signed)
Left message on machine to call back  

## 2022-10-24 NOTE — Telephone Encounter (Signed)
Patient is returning your call.  

## 2022-10-24 NOTE — Telephone Encounter (Signed)
Patient has already been taking 50mg  QD since 09/20/22, med list was not updated at the time of change. Please advise.

## 2022-10-24 NOTE — Telephone Encounter (Signed)
No answer, Left message to return call.  

## 2022-10-24 NOTE — Telephone Encounter (Signed)
Luis Marquez, please inform patient that we cannot perform the procedure at [facility] due to CHF with low EF for safety reasons.   Patty, please help schedule patient at hospital endoscopy unit next available appointment.  Thank you

## 2022-10-25 ENCOUNTER — Other Ambulatory Visit: Payer: Self-pay

## 2022-10-25 MED ORDER — LOSARTAN POTASSIUM 100 MG PO TABS
100.0000 mg | ORAL_TABLET | Freq: Every day | ORAL | 3 refills | Status: DC
  Filled 2022-10-25: qty 90, 90d supply, fill #0
  Filled 2023-02-03: qty 90, 90d supply, fill #1

## 2022-10-25 NOTE — Addendum Note (Signed)
Addended by: Rob Bunting R on: 10/25/2022 03:08 PM   Modules accepted: Orders

## 2022-10-25 NOTE — Telephone Encounter (Signed)
Patient advised and verbalized understanding. New Rx sent into patients pharmacy.  

## 2022-10-26 ENCOUNTER — Other Ambulatory Visit: Payer: Self-pay

## 2022-10-26 ENCOUNTER — Encounter: Payer: Medicaid Other | Admitting: Gastroenterology

## 2022-10-26 NOTE — Telephone Encounter (Signed)
Ok, thanks [place].  John, does this change assessment as his EF has improved?

## 2022-10-27 NOTE — Telephone Encounter (Signed)
Left message on machine to call back  

## 2022-10-27 NOTE — Telephone Encounter (Signed)
Patty: can you please get him back on the schedule at [facility], apologize for the confusion

## 2022-10-27 NOTE — Telephone Encounter (Signed)
I have spoken to the pt and he is agreeable to Colon EGD on 11/22/22 at 330 pm.  The pt has been sent new instructions.  [facility] case has been cancelled.

## 2022-10-28 ENCOUNTER — Telehealth: Payer: Self-pay

## 2022-10-28 NOTE — Telephone Encounter (Signed)
Dr. Gala Romney,  You saw this patient on 09/08/2022. Will you please comment on medical clearance for removal of retained from previous ORIF for intertrochanter?  Please route your response to [facility] Preop. I will communicate with requesting office once you have given recommendations.   Thank you!  Carlos Levering, NP

## 2022-10-28 NOTE — Telephone Encounter (Signed)
   Pre-operative Risk Assessment    Patient Name: Luis Marquez  DOB: 02/15/1961 MRN: [id number]      Request for Surgical Clearance    Procedure:   REMOVAL OF RETAINED FROM PREVIOUS ORIF FOR INTERTROCHANTERI  Date of Surgery:  Clearance TBD                                 Surgeon:  DR. MATTHEW OLIN Surgeon's Group or Practice Name:  [facility] Phone number:  [phone] Fax number:  [phone]   Type of Clearance Requested:   - Medical  - Pharmacy:  Hold Apixaban (Eliquis) NEEDS INSTRUCTIONS WHEN OR IF TO HOLD   Type of Anesthesia:  Spinal   Additional requests/questions:    Signed, Michaelle Copas   10/28/2022, 11:00 AM

## 2022-10-28 NOTE — Telephone Encounter (Signed)
   Name: Luis Marquez  DOB: 1961/02/06  MRN: [id number]   Primary Cardiologist: None  Chart reviewed as part of pre-operative protocol coverage. [name] was last seen on 09/08/2022 by Dr. Gala Romney.  Per correspondence with Dr. Gala Romney: "EF now normal. Ok to proceed with surgery (low risk)."  Therefore, based on ACC/AHA guidelines, the patient would be at acceptable risk for the planned procedure without further cardiovascular testing.   Per Pharm.D.: Patient with diagnosis of afib on Eliquis for anticoagulation.     Procedure: REMOVAL OF RETAINED FROM PREVIOUS ORIF FOR INTERTROCHANTERI    Date of procedure: TBD   CHA2DS2-VASc Score = 2  This indicates a 2.2% annual risk of stroke. The patient's score is based upon: CHF History: 1 HTN History: 1 Diabetes History: 0 Stroke History: 0 Vascular Disease History: 0 Age Score: 0 Gender Score: 0   CrCl 171mL/min Platelet count 253K   Per office protocol, patient can hold Eliquis for 2 days prior to procedure.  I will route this recommendation to the requesting party via Epic fax function and remove from pre-op pool. Please call with questions.  Carlos Levering, NP 10/28/2022, 4:35 PM

## 2022-10-28 NOTE — Telephone Encounter (Signed)
Patient with diagnosis of afib on Eliquis for anticoagulation.    Procedure: REMOVAL OF RETAINED FROM PREVIOUS ORIF FOR INTERTROCHANTERI   Date of procedure: TBD  CHA2DS2-VASc Score = 2  This indicates a 2.2% annual risk of stroke. The patient's score is based upon: CHF History: 1 HTN History: 1 Diabetes History: 0 Stroke History: 0 Vascular Disease History: 0 Age Score: 0 Gender Score: 0  CrCl 131mL/min Platelet count 253K  Per office protocol, patient can hold Eliquis for 2 days prior to procedure.    **This guidance is not considered finalized until pre-operative APP has relayed final recommendations.**

## 2022-11-01 NOTE — Progress Notes (Signed)
Reviewed and agree with documentation and assessment and plan. K. Veena Donavon Kimrey , MD   

## 2022-11-14 ENCOUNTER — Encounter: Payer: 59 | Admitting: Gastroenterology

## 2022-11-14 ENCOUNTER — Other Ambulatory Visit: Payer: Self-pay

## 2022-11-15 ENCOUNTER — Ambulatory Visit: Payer: 59 | Attending: Nurse Practitioner | Admitting: Nurse Practitioner

## 2022-11-15 ENCOUNTER — Other Ambulatory Visit: Payer: Self-pay

## 2022-11-15 ENCOUNTER — Encounter: Payer: Self-pay | Admitting: Nurse Practitioner

## 2022-11-15 VITALS — BP 121/78 | HR 72 | Ht 73.0 in | Wt 231.0 lb

## 2022-11-15 DIAGNOSIS — L989 Disorder of the skin and subcutaneous tissue, unspecified: Secondary | ICD-10-CM

## 2022-11-15 DIAGNOSIS — R7989 Other specified abnormal findings of blood chemistry: Secondary | ICD-10-CM

## 2022-11-15 DIAGNOSIS — I10 Essential (primary) hypertension: Secondary | ICD-10-CM

## 2022-11-15 DIAGNOSIS — R439 Unspecified disturbances of smell and taste: Secondary | ICD-10-CM | POA: Diagnosis not present

## 2022-11-15 DIAGNOSIS — D649 Anemia, unspecified: Secondary | ICD-10-CM

## 2022-11-15 NOTE — Patient Instructions (Addendum)
[facility]     Address: [address], [place], [place] [address] Phone: [phone]   

## 2022-11-15 NOTE — Progress Notes (Signed)
Assessment & Plan:  Luis Marquez was seen today for hypertension.  Diagnoses and all orders for this visit:  Primary hypertension Continue all antihypertensives as prescribed.  Reminded to bring in blood pressure log for follow  up appointment.  RECOMMENDATIONS: DASH/Mediterranean Diets are healthier choices for HTN.     Low serum calcium -     CMP14+EGFR -     VITAMIN D 25 Hydroxy (Vit-D Deficiency, Fractures)  Anemia, unspecified type -     CBC with Differential  Decreased taste and smell -     Ambulatory referral to ENT  Lesion of skin of scalp -     Ambulatory referral to Dermatology    Patient has been counseled on age-appropriate routine health concerns for screening and prevention. These are reviewed and up-to-date. Referrals have been placed accordingly. Immunizations are up-to-date or declined.    Subjective:   Chief Complaint  Patient presents with   Hypertension   [name] 62 y.o. 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  Awaiting his hip surgery to be scheduled.   HTN Blood pressure is well controlled today. He is currently taking losartan 100 mg daily and  toprol XL 25 mg daily. No longer taking furosemide. Entresto and spironolactone were dc'd by cardiology. He does continue to smoke.  BP Readings from Last 3 Encounters:  11/15/22 121/78  09/20/22 136/68  09/08/22 (!) 150/90    Notes chronic anosmia and ageusia. States he has never been diagnosed with COVID.   Would like to see a different dermatologist for his scalp and facial lesions. Was seeing Dr. Rocky Link at [facility] and states their personalities did not mesh well. See photos from 08-16-2022.   Review of Systems  Constitutional:  Negative for fever, malaise/fatigue and weight loss.   HENT:  Negative for nosebleeds.        SEE HPI  Eyes: Negative.  Negative for blurred vision, double vision and photophobia.  Respiratory: Negative.  Negative for cough and shortness of breath.   Cardiovascular: Negative.  Negative for chest pain, palpitations and leg swelling.  Gastrointestinal: Negative.  Negative for heartburn, nausea and vomiting.  Musculoskeletal:  Positive for joint pain. Negative for myalgias.  Neurological: Negative.  Negative for dizziness, focal weakness, seizures and headaches.  Psychiatric/Behavioral: Negative.  Negative for suicidal ideas.     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   Emphysema, unspecified ([facility])    ETOH abuse    Fracture, intertrochanteric, right femur ([facility]) 11/11/2014   Hypertension    Hypomagnesemia    Laceration of head 11/03/2021   Paroxysmal A-fib ([facility])    Syncope 12/07/2021   Tobacco abuse     Past Surgical History:  Procedure Laterality Date   BIOPSY  12/10/2021   Procedure: BIOPSY;  Surgeon: Napoleon Form, MD;  [facility];  Service: Gastroenterology;;   ESOPHAGOGASTRODUODENOSCOPY (EGD) WITH PROPOFOL N/A 12/10/2021   Procedure: ESOPHAGOGASTRODUODENOSCOPY (EGD) WITH PROPOFOL;  Surgeon: Napoleon Form, MD;  [facility];  Service: Gastroenterology;  Laterality: N/A;   HEMOSTASIS CLIP PLACEMENT  12/10/2021   Procedure: HEMOSTASIS CLIP PLACEMENT;  Surgeon: Napoleon Form, MD;  [facility];  Service: Gastroenterology;;   HERNIA REPAIR  INTRAMEDULLARY (IM) NAIL INTERTROCHANTERIC Right 11/11/2014   Procedure: INTRAMEDULLARY (IM) NAIL INTERTROCHANTRIC;  Surgeon: Durene Romans, MD;  [facility];  Service: Orthopedics;  Laterality: Right;   RIGHT/LEFT HEART CATH AND CORONARY ANGIOGRAPHY N/A 11/11/2021   Procedure: RIGHT/LEFT HEART CATH AND CORONARY ANGIOGRAPHY;  Surgeon: Dolores Patty, MD;  [facility];  Service: Cardiovascular;  Laterality: N/A;   Rod right leg      Family History  Problem Relation Age of Onset   Diabetes Mother    Colon cancer Neg Hx    Stomach cancer Neg Hx    Esophageal cancer Neg Hx     Social History Reviewed with no changes to be made today.   Outpatient Medications Prior to Visit  Medication Sig Dispense Refill   albuterol (VENTOLIN HFA) 108 (90 Base) MCG/ACT inhaler Inhale 2 puffs into the lungs every 6 (six) hours as needed for wheezing or shortness of breath. 18 g 0   apixaban (ELIQUIS) 5 MG TABS tablet Take 1 tablet (5 mg total) by mouth 2 (two) times daily. 180 tablet 1   budesonide-formoterol (SYMBICORT) 160-4.5 MCG/ACT inhaler Inhale 2 puffs into the lungs 2 (two) times daily. 10.2 g 3   losartan (COZAAR) 100 MG tablet Take 1 tablet (100 mg total) by mouth daily. 90 tablet 3   metoprolol succinate (TOPROL-XL) 25 MG 24 hr tablet Take 1 tablet (25 mg total) by mouth daily. 90 tablet 1   pantoprazole (PROTONIX) 40 MG tablet Take 1 tablet (40 mg total) by mouth 2 (two) times daily. 180 tablet 2   PEG-KCl-NaCl-NaSulf-Na Asc-C (PLENVU) 140 g SOLR Take 1 kit by mouth as directed. Use coupon: BIN: 161096 PNC: CNRX Group: [id number] ID: [id number] 3 each 0   potassium chloride SA (KLOR-CON M) 20 MEQ tablet Take 2 tablets (40 mEq total) by mouth daily at 12 noon for 1 day, THEN 1 tablet (20 mEq total) daily at 12 noon. 31 tablet 6   No facility-administered medications prior to visit.    No Known Allergies     Objective:    BP 121/78 (BP Location: Left Arm, Patient Position: Sitting, Cuff Size: Normal)   Pulse 72   Ht 6\' 1"  (1.854 m)   Wt 231 lb (104.8 kg)   SpO2 98%   BMI 30.48 kg/m  Wt Readings from Last 3 Encounters:  11/15/22 231 lb (104.8 kg)  09/20/22 236 lb (107 kg)  09/08/22 221 lb (100.2 kg)    Physical Exam Vitals and nursing note reviewed.  Constitutional:      Appearance: He is well-developed.  HENT:     Head:  Normocephalic and atraumatic.  Cardiovascular:     Rate and Rhythm: Normal rate and regular rhythm.     Heart sounds: Normal heart sounds. No murmur heard.    No friction rub. No gallop.  Pulmonary:     Effort: Pulmonary effort is normal. No tachypnea or respiratory distress.     Breath sounds: Normal breath sounds. No decreased breath sounds, wheezing, rhonchi or rales.  Chest:     Chest wall: No tenderness.  Abdominal:     General: Bowel sounds are normal.     Palpations: Abdomen is soft.  Musculoskeletal:        General: Normal range of motion.     Cervical back: Normal range of motion.  Skin:    General: Skin is warm and dry.  Neurological:     Mental Status: He is alert and oriented  to person, place, and time.     Coordination: Coordination normal.  Psychiatric:        Behavior: Behavior normal. Behavior is cooperative.        Thought Content: Thought content normal.        Judgment: Judgment normal.          Patient has been counseled extensively about nutrition and exercise as well as the importance of adherence with medications and regular follow-up. The patient was given clear instructions to go to ER or return to medical center if symptoms don't improve, worsen or new problems develop. The patient verbalized understanding.   Follow-up: Return in about 3 months (around 02/14/2023).   Claiborne Rigg, FNP-BC [facility] [place], [place] [phone]   11/15/2022, 6:27 PM

## 2022-11-16 ENCOUNTER — Telehealth: Payer: Self-pay | Admitting: Gastroenterology

## 2022-11-16 DIAGNOSIS — Z419 Encounter for procedure for purposes other than remedying health state, unspecified: Secondary | ICD-10-CM | POA: Diagnosis not present

## 2022-11-16 LAB — CMP14+EGFR
ALT: 20 IU/L (ref 0–44)
AST: 30 IU/L (ref 0–40)
Albumin/Globulin Ratio: 1.2 (ref 1.2–2.2)
Albumin: 3.6 g/dL — ABNORMAL LOW (ref 3.9–4.9)
Alkaline Phosphatase: 133 IU/L — ABNORMAL HIGH (ref 44–121)
BUN/Creatinine Ratio: 9 — ABNORMAL LOW (ref 10–24)
BUN: 9 mg/dL (ref 8–27)
Bilirubin Total: 0.3 mg/dL (ref 0.0–1.2)
CO2: 21 mmol/L (ref 20–29)
Calcium: 9.4 mg/dL (ref 8.6–10.2)
Chloride: 99 mmol/L (ref 96–106)
Creatinine, Ser: 1.03 mg/dL (ref 0.76–1.27)
Globulin, Total: 3 g/dL (ref 1.5–4.5)
Glucose: 95 mg/dL (ref 70–99)
Potassium: 4 mmol/L (ref 3.5–5.2)
Sodium: 139 mmol/L (ref 134–144)
Total Protein: 6.6 g/dL (ref 6.0–8.5)
eGFR: 81 mL/min/{1.73_m2} (ref >59)

## 2022-11-16 LAB — CBC WITH DIFFERENTIAL/PLATELET
Basophils Absolute: 0.1 10*3/uL (ref 0.0–0.2)
Basos: 1 %
EOS (ABSOLUTE): 0.2 10*3/uL (ref 0.0–0.4)
Eos: 2 %
Hematocrit: 40.6 % (ref 37.5–51.0)
Hemoglobin: 14 g/dL (ref 13.0–17.7)
Immature Grans (Abs): 0 10*3/uL (ref 0.0–0.1)
Immature Granulocytes: 0 %
Lymphocytes Absolute: 1.2 10*3/uL (ref 0.7–3.1)
Lymphs: 17 %
MCH: 33.9 pg — ABNORMAL HIGH (ref 26.6–33.0)
MCHC: 34.5 g/dL (ref 31.5–35.7)
MCV: 98 fL — ABNORMAL HIGH (ref 79–97)
Monocytes Absolute: 0.5 10*3/uL (ref 0.1–0.9)
Monocytes: 7 %
Neutrophils Absolute: 5 10*3/uL (ref 1.4–7.0)
Neutrophils: 73 %
Platelets: 292 10*3/uL (ref 150–450)
RBC: 4.13 x10E6/uL — ABNORMAL LOW (ref 4.14–5.80)
RDW: 12.4 % (ref 11.6–15.4)
WBC: 7 10*3/uL (ref 3.4–10.8)

## 2022-11-16 LAB — VITAMIN D 25 HYDROXY (VIT D DEFICIENCY, FRACTURES): Vit D, 25-Hydroxy: 45.8 ng/mL (ref 30.0–100.0)

## 2022-11-16 NOTE — Telephone Encounter (Signed)
Please assist this patient with his question. Thank you. Bre

## 2022-11-16 NOTE — Telephone Encounter (Signed)
Patient called requesting to speak about his Eliquis medication. He is making sure that it is okay to finish taking it on Friday or Saturday before his procedure on 05/07. Please advise, thank you.

## 2022-11-16 NOTE — Telephone Encounter (Signed)
The pt has been the he will stop his Eliquis on Saturday (last dose on Sat)and will be advised when to resume after the procedure.The pt has been advised of the information and verbalized understanding.

## 2022-11-22 ENCOUNTER — Telehealth: Payer: Self-pay | Admitting: *Deleted

## 2022-11-22 ENCOUNTER — Other Ambulatory Visit: Payer: Self-pay

## 2022-11-22 ENCOUNTER — Inpatient Hospital Stay (HOSPITAL_COMMUNITY)
Admission: EM | Admit: 2022-11-22 | Discharge: 2022-11-28 | DRG: 378 | Disposition: A | Discharge status: Home / Self Care | Payer: 59 | Attending: Internal Medicine | Admitting: Internal Medicine

## 2022-11-22 ENCOUNTER — Encounter: Payer: Medicaid Other | Admitting: Gastroenterology

## 2022-11-22 ENCOUNTER — Encounter (HOSPITAL_COMMUNITY): Payer: Self-pay

## 2022-11-22 DIAGNOSIS — I509 Heart failure, unspecified: Secondary | ICD-10-CM | POA: Diagnosis not present

## 2022-11-22 DIAGNOSIS — K2289 Other specified disease of esophagus: Secondary | ICD-10-CM | POA: Diagnosis present

## 2022-11-22 DIAGNOSIS — I110 Hypertensive heart disease with heart failure: Secondary | ICD-10-CM | POA: Diagnosis present

## 2022-11-22 DIAGNOSIS — K2980 Duodenitis without bleeding: Secondary | ICD-10-CM | POA: Diagnosis not present

## 2022-11-22 DIAGNOSIS — I4891 Unspecified atrial fibrillation: Secondary | ICD-10-CM | POA: Diagnosis not present

## 2022-11-22 DIAGNOSIS — K5730 Diverticulosis of large intestine without perforation or abscess without bleeding: Secondary | ICD-10-CM | POA: Diagnosis not present

## 2022-11-22 DIAGNOSIS — I2510 Atherosclerotic heart disease of native coronary artery without angina pectoris: Secondary | ICD-10-CM | POA: Diagnosis present

## 2022-11-22 DIAGNOSIS — R000 Tachycardia, unspecified: Secondary | ICD-10-CM | POA: Diagnosis present

## 2022-11-22 DIAGNOSIS — D125 Benign neoplasm of sigmoid colon: Secondary | ICD-10-CM

## 2022-11-22 DIAGNOSIS — K219 Gastro-esophageal reflux disease without esophagitis: Secondary | ICD-10-CM | POA: Diagnosis not present

## 2022-11-22 DIAGNOSIS — K921 Melena: Secondary | ICD-10-CM | POA: Diagnosis not present

## 2022-11-22 DIAGNOSIS — D124 Benign neoplasm of descending colon: Secondary | ICD-10-CM

## 2022-11-22 DIAGNOSIS — I5022 Chronic systolic (congestive) heart failure: Secondary | ICD-10-CM | POA: Diagnosis present

## 2022-11-22 DIAGNOSIS — K229 Disease of esophagus, unspecified: Secondary | ICD-10-CM

## 2022-11-22 DIAGNOSIS — K2100 Gastro-esophageal reflux disease with esophagitis, without bleeding: Secondary | ICD-10-CM | POA: Diagnosis present

## 2022-11-22 DIAGNOSIS — D62 Acute posthemorrhagic anemia: Secondary | ICD-10-CM | POA: Diagnosis present

## 2022-11-22 DIAGNOSIS — D122 Benign neoplasm of ascending colon: Secondary | ICD-10-CM

## 2022-11-22 DIAGNOSIS — Z87891 Personal history of nicotine dependence: Secondary | ICD-10-CM

## 2022-11-22 DIAGNOSIS — J439 Emphysema, unspecified: Secondary | ICD-10-CM | POA: Diagnosis present

## 2022-11-22 DIAGNOSIS — K567 Ileus, unspecified: Secondary | ICD-10-CM | POA: Diagnosis not present

## 2022-11-22 DIAGNOSIS — E669 Obesity, unspecified: Secondary | ICD-10-CM | POA: Diagnosis present

## 2022-11-22 DIAGNOSIS — Z833 Family history of diabetes mellitus: Secondary | ICD-10-CM | POA: Diagnosis not present

## 2022-11-22 DIAGNOSIS — K625 Hemorrhage of anus and rectum: Secondary | ICD-10-CM | POA: Diagnosis present

## 2022-11-22 DIAGNOSIS — Z86010 Personal history of colonic polyps: Secondary | ICD-10-CM

## 2022-11-22 DIAGNOSIS — Z79899 Other long term (current) drug therapy: Secondary | ICD-10-CM | POA: Diagnosis not present

## 2022-11-22 DIAGNOSIS — J449 Chronic obstructive pulmonary disease, unspecified: Secondary | ICD-10-CM | POA: Diagnosis not present

## 2022-11-22 DIAGNOSIS — Z6830 Body mass index (BMI) 30.0-30.9, adult: Secondary | ICD-10-CM

## 2022-11-22 DIAGNOSIS — K648 Other hemorrhoids: Secondary | ICD-10-CM | POA: Diagnosis present

## 2022-11-22 DIAGNOSIS — D128 Benign neoplasm of rectum: Secondary | ICD-10-CM | POA: Diagnosis not present

## 2022-11-22 DIAGNOSIS — I428 Other cardiomyopathies: Secondary | ICD-10-CM | POA: Diagnosis present

## 2022-11-22 DIAGNOSIS — I5023 Acute on chronic systolic (congestive) heart failure: Secondary | ICD-10-CM | POA: Diagnosis not present

## 2022-11-22 DIAGNOSIS — K922 Gastrointestinal hemorrhage, unspecified: Secondary | ICD-10-CM | POA: Diagnosis not present

## 2022-11-22 DIAGNOSIS — Z7901 Long term (current) use of anticoagulants: Secondary | ICD-10-CM | POA: Diagnosis not present

## 2022-11-22 DIAGNOSIS — K5791 Diverticulosis of intestine, part unspecified, without perforation or abscess with bleeding: Secondary | ICD-10-CM | POA: Insufficient documentation

## 2022-11-22 DIAGNOSIS — K5790 Diverticulosis of intestine, part unspecified, without perforation or abscess without bleeding: Secondary | ICD-10-CM | POA: Diagnosis not present

## 2022-11-22 DIAGNOSIS — K449 Diaphragmatic hernia without obstruction or gangrene: Secondary | ICD-10-CM | POA: Diagnosis present

## 2022-11-22 DIAGNOSIS — E785 Hyperlipidemia, unspecified: Secondary | ICD-10-CM | POA: Diagnosis present

## 2022-11-22 DIAGNOSIS — Z7951 Long term (current) use of inhaled steroids: Secondary | ICD-10-CM

## 2022-11-22 DIAGNOSIS — F17210 Nicotine dependence, cigarettes, uncomplicated: Secondary | ICD-10-CM | POA: Diagnosis not present

## 2022-11-22 DIAGNOSIS — K5731 Diverticulosis of large intestine without perforation or abscess with bleeding: Secondary | ICD-10-CM | POA: Diagnosis present

## 2022-11-22 DIAGNOSIS — I480 Paroxysmal atrial fibrillation: Secondary | ICD-10-CM | POA: Diagnosis present

## 2022-11-22 DIAGNOSIS — I10 Essential (primary) hypertension: Secondary | ICD-10-CM | POA: Diagnosis not present

## 2022-11-22 LAB — CBC WITH DIFFERENTIAL/PLATELET
Abs Immature Granulocytes: 0.01 10*3/uL (ref 0.00–0.07)
Basophils Absolute: 0 10*3/uL (ref 0.0–0.1)
Basophils Relative: 1 %
Eosinophils Absolute: 0.2 10*3/uL (ref 0.0–0.5)
Eosinophils Relative: 4 %
HCT: 39.5 % (ref 39.0–52.0)
Hemoglobin: 13.3 g/dL (ref 13.0–17.0)
Immature Granulocytes: 0 %
Lymphocytes Relative: 18 %
Lymphs Abs: 1 10*3/uL (ref 0.7–4.0)
MCH: 33.4 pg (ref 26.0–34.0)
MCHC: 33.7 g/dL (ref 30.0–36.0)
MCV: 99.2 fL (ref 80.0–100.0)
Monocytes Absolute: 0.5 10*3/uL (ref 0.1–1.0)
Monocytes Relative: 8 %
Neutro Abs: 3.9 10*3/uL (ref 1.7–7.7)
Neutrophils Relative %: 69 %
Platelets: 225 10*3/uL (ref 150–400)
RBC: 3.98 MIL/uL — ABNORMAL LOW (ref 4.22–5.81)
RDW: 13.2 % (ref 11.5–15.5)
WBC: 5.6 10*3/uL (ref 4.0–10.5)
nRBC: 0 % (ref 0.0–0.2)

## 2022-11-22 LAB — COMPREHENSIVE METABOLIC PANEL
ALT: 21 U/L (ref 0–44)
AST: 30 U/L (ref 15–41)
Albumin: 3.1 g/dL — ABNORMAL LOW (ref 3.5–5.0)
Alkaline Phosphatase: 110 U/L (ref 38–126)
Anion gap: 9 (ref 5–15)
BUN: 8 mg/dL (ref 8–23)
CO2: 28 mmol/L (ref 22–32)
Calcium: 8.6 mg/dL — ABNORMAL LOW (ref 8.9–10.3)
Chloride: 102 mmol/L (ref 98–111)
Creatinine, Ser: 0.8 mg/dL (ref 0.61–1.24)
GFR, Estimated: >60 mL/min (ref >60)
Glucose, Bld: 112 mg/dL — ABNORMAL HIGH (ref 70–99)
Potassium: 3.8 mmol/L (ref 3.5–5.1)
Sodium: 139 mmol/L (ref 135–145)
Total Bilirubin: 1.6 mg/dL — ABNORMAL HIGH (ref 0.3–1.2)
Total Protein: 6.4 g/dL — ABNORMAL LOW (ref 6.5–8.1)

## 2022-11-22 LAB — TYPE AND SCREEN
ABO/RH(D): O POS
Antibody Screen: NEGATIVE

## 2022-11-22 LAB — CBC
HCT: 37.2 % — ABNORMAL LOW (ref 39.0–52.0)
Hemoglobin: 12.6 g/dL — ABNORMAL LOW (ref 13.0–17.0)
MCH: 33.4 pg (ref 26.0–34.0)
MCHC: 33.9 g/dL (ref 30.0–36.0)
MCV: 98.7 fL (ref 80.0–100.0)
Platelets: 224 10*3/uL (ref 150–400)
RBC: 3.77 MIL/uL — ABNORMAL LOW (ref 4.22–5.81)
RDW: 13.1 % (ref 11.5–15.5)
WBC: 6.2 10*3/uL (ref 4.0–10.5)
nRBC: 0 % (ref 0.0–0.2)

## 2022-11-22 LAB — PROTIME-INR
INR: 1.2 (ref 0.8–1.2)
Prothrombin Time: 15 seconds (ref 11.4–15.2)

## 2022-11-22 LAB — HEMOGLOBIN AND HEMATOCRIT, BLOOD
HCT: 38.2 % — ABNORMAL LOW (ref 39.0–52.0)
HCT: 36.6 % — ABNORMAL LOW (ref 39.0–52.0)
Hemoglobin: 12.7 g/dL — ABNORMAL LOW (ref 13.0–17.0)
Hemoglobin: 12.4 g/dL — ABNORMAL LOW (ref 13.0–17.0)

## 2022-11-22 MED ORDER — LABETALOL HCL 5 MG/ML IV SOLN: 10.0000 mg | Freq: Once | INTRAVENOUS | Status: DC

## 2022-11-22 MED ORDER — ENALAPRIL MALEATE 2.5 MG PO TABS: 2.5000 mg | ORAL_TABLET | Freq: Two times a day (BID) | ORAL | Status: DC

## 2022-11-22 MED ORDER — CARVEDILOL 6.25 MG PO TABS
6.2500 mg | ORAL_TABLET | Freq: Two times a day (BID) | ORAL | Status: DC
  Administered 2022-11-22 – 2022-11-28 (×12): 6.25 mg via ORAL
  Filled 2022-11-22 (×13): qty 1

## 2022-11-22 MED ORDER — ADULT MULTIVITAMIN W/MINERALS CH
1.0000 | ORAL_TABLET | Freq: Every day | ORAL | Status: DC
  Administered 2022-11-22 – 2022-11-28 (×7): 1 via ORAL
  Filled 2022-11-22 (×7): qty 1

## 2022-11-22 MED ORDER — ACETAMINOPHEN 325 MG PO TABS
650.0000 mg | ORAL_TABLET | Freq: Four times a day (QID) | ORAL | Status: AC | PRN
  Administered 2022-11-22 – 2022-11-23 (×2): 650 mg via ORAL
  Filled 2022-11-22 (×2): qty 2

## 2022-11-22 MED ORDER — AMLODIPINE BESYLATE 10 MG PO TABS: 10.0000 mg | ORAL_TABLET | Freq: Every day | ORAL | Status: DC

## 2022-11-22 MED ORDER — MOMETASONE FURO-FORMOTEROL FUM 200-5 MCG/ACT IN AERO
2.0000 | INHALATION_SPRAY | Freq: Two times a day (BID) | RESPIRATORY_TRACT | Status: DC
  Administered 2022-11-22 – 2022-11-28 (×12): 2 via RESPIRATORY_TRACT
  Filled 2022-11-22: qty 8.8

## 2022-11-22 MED ORDER — THIAMINE MONONITRATE 100 MG PO TABS
100.0000 mg | ORAL_TABLET | Freq: Every day | ORAL | Status: DC
  Administered 2022-11-22 – 2022-11-28 (×7): 100 mg via ORAL
  Filled 2022-11-22 (×7): qty 1

## 2022-11-22 MED ORDER — HYDRALAZINE HCL 20 MG/ML IJ SOLN
10.0000 mg | Freq: Four times a day (QID) | INTRAMUSCULAR | Status: DC | PRN
  Administered 2022-11-22: 10 mg via INTRAVENOUS
  Filled 2022-11-22: qty 1

## 2022-11-22 MED ORDER — CARVEDILOL 3.125 MG PO TABS: 3.1250 mg | ORAL_TABLET | Freq: Two times a day (BID) | ORAL | Status: DC

## 2022-11-22 MED ORDER — HYDRALAZINE HCL 25 MG PO TABS
25.0000 mg | ORAL_TABLET | Freq: Three times a day (TID) | ORAL | Status: DC
  Administered 2022-11-22 – 2022-11-28 (×18): 25 mg via ORAL
  Filled 2022-11-22 (×18): qty 1

## 2022-11-22 MED ORDER — ALBUTEROL SULFATE (2.5 MG/3ML) 0.083% IN NEBU: 2.5000 mg | INHALATION_SOLUTION | Freq: Four times a day (QID) | RESPIRATORY_TRACT | Status: DC | PRN

## 2022-11-22 MED ORDER — METOPROLOL SUCCINATE ER 25 MG PO TB24: 25.0000 mg | ORAL_TABLET | Freq: Every day | ORAL | Status: DC

## 2022-11-22 MED ORDER — THIAMINE HCL 100 MG/ML IJ SOLN: 100.0000 mg | Freq: Every day | INTRAMUSCULAR | Status: DC

## 2022-11-22 MED ORDER — LORAZEPAM 1 MG PO TABS: 1.0000 mg | ORAL_TABLET | ORAL | Status: AC | PRN

## 2022-11-22 MED ORDER — ALBUTEROL SULFATE HFA 108 (90 BASE) MCG/ACT IN AERS: 2.0000 | INHALATION_SPRAY | Freq: Four times a day (QID) | RESPIRATORY_TRACT | Status: DC | PRN

## 2022-11-22 MED ORDER — SODIUM CHLORIDE 0.9 % IV SOLN: INTRAVENOUS | Status: DC

## 2022-11-22 MED ORDER — FOLIC ACID 1 MG PO TABS
1.0000 mg | ORAL_TABLET | Freq: Every day | ORAL | Status: DC
  Administered 2022-11-22 – 2022-11-28 (×7): 1 mg via ORAL
  Filled 2022-11-22 (×7): qty 1

## 2022-11-22 MED ORDER — LORAZEPAM 2 MG/ML IJ SOLN: 1.0000 mg | INTRAMUSCULAR | Status: AC | PRN

## 2022-11-22 MED ORDER — ENALAPRILAT 1.25 MG/ML IV SOLN
0.6250 mg | Freq: Once | INTRAVENOUS | Status: AC
  Administered 2022-11-22: 0.625 mg via INTRAVENOUS
  Filled 2022-11-22: qty 0.5

## 2022-11-22 MED ORDER — PANTOPRAZOLE SODIUM 40 MG PO TBEC
40.0000 mg | DELAYED_RELEASE_TABLET | Freq: Two times a day (BID) | ORAL | Status: DC
  Administered 2022-11-22 – 2022-11-28 (×12): 40 mg via ORAL
  Filled 2022-11-22 (×12): qty 1

## 2022-11-22 MED ORDER — LABETALOL HCL 5 MG/ML IV SOLN
10.0000 mg | Freq: Once | INTRAVENOUS | Status: AC
  Administered 2022-11-22: 10 mg via INTRAVENOUS
  Filled 2022-11-22: qty 4

## 2022-11-22 MED ORDER — METOPROLOL TARTRATE 12.5 MG HALF TABLET
12.5000 mg | ORAL_TABLET | Freq: Two times a day (BID) | ORAL | Status: DC
  Administered 2022-11-22: 12.5 mg via ORAL
  Filled 2022-11-22: qty 1

## 2022-11-22 NOTE — Telephone Encounter (Signed)
PT called the answering service this morning per Dr. Adela Lank stating that he was "passing blood with his prep and should he continue?" Returned the patient's call. He is passing clots and bright red blood "a lot" he states. He was on Eliquis and his last dose was Friday. Will notify Dr. Lavon Paganini for advise.

## 2022-11-22 NOTE — Consult Note (Addendum)
Referring Provider: Dr. Benjiman Core Primary Care Physician:  Claiborne Rigg, NP Primary Gastroenterologist:  Dr. Lavon Paganini   Reason for Consultation:  Hematochezia   HPI: Luis Marquez is a 62 y.o. male with a past medical history of alcohol use disorder, hypertension, chronic systolic CHF with LV EF 20 -25 % per ECHO 11/2021 and bedside ECHO done by Dr. Gala Romney 09/08/2022 showed LV EF 60 - 65%, paroxysmal atrial fibrillation on Eliquis, COPD and GERD.   He was scheduled for an outpatient EGD and colonoscopy today which was canceled after he developed hematochezia after finishing one half of the bowel prep.  Labs in the ED showed a hemoglobin level of 13.3 (Hg 14 on 11/15/2022).  Hematocrit 39.5.  Platelet 225.  Sodium 139.  Potassium 3.8.  BUN 8.  Creatinine 0.80.  Total bili 1.6.  Alk phos 110.  AST 30.  ALT 21.  INR 1.2.  No further hematochezia since arriving to the ED.  Hemodynamically stable.  He described taking the firs half of the bowel prep yesterday evening then started passing brown solid to loose stool. Around midnight, he saw a small amount of bright red blood on the toilet tissue and at 4am he passed a large dark red gelatinous blood clot. He went back to bed and at 7:30am he passed a small volume of bright red blood per the rectum x 2 without recurrence. He endorsed having "gripping" lower abdominal pain for 3 days two weeks ago which recurred on Saturday 11/19/2022 for one day then abated. No further abdominal pain since then. No heartburn or dysphagia. Last dose of Eliquis was 11/18/2022. No NSAID use. He drinks a 6 pack of beer two days weekly, last alcohol intake was 11/18/2022. He remains on PPI bid at home.  He underwent an EGD 12/10/2021 due to CTAP (done for hypercalcemia and unintentional weight loss) which identified esophageal thickening in setting of dysphagia.  EGD identified grade D esophagitis with bleeding, clips placed, a 4 cm hiatal hernia, gastritis and duodenopathy.   He was prescribed Pantoprazole 40 mg twice daily, Sucralfate 1 g p.o. 4 times daily for 2 weeks with plans to repeat an EGD in 3 months to check esophagitis healing and to exclude Barrett's esophagus.    He underwent a colonoscopy in 2014 or 2015 in [place] which possibly removed 1 polyp.  GI PROCEDURES:  EGD 12/10/2021: - LA Grade D esophagitis with bleeding. Biopsied. Clip (MR conditional) was placed.  - Gastritis. Biopsied.  - 4 cm hiatal hernia. - Erythematous duodenopathy.  A. STOMACH, BIOPSY:  - Gastric antral and oxyntic mucosa with mild nonspecific reactive  gastropathy  - Helicobacter pylori-like organisms are not identified on routine HE  stain   B. ESOPHAGUS, BIOPSY:  - Esophageal squamous and cardiac mucosa with ulceration and associated  granulation tissue  - Negative for intestinal metaplasia, dysplasia or carcinoma  - Viral cytopathic changes are not identified   ECHO, bedside by Dr. Gala Romney 09/08/2022: EF 60-65%   ECHO 11/04/2021:  Left ventricular ejection fraction, by estimation, is 20 to 25%. The left ventricle has severely decreased function. The left ventricle demonstrates regional wall motion abnormalities mid and apical hypokinesis sparing the base). Left ventricular diastolic parameters are indeterminate. No LV thrombus 1. Right ventricular systolic function is mildly reduced. The right ventricular size is normal. Mildly increased right ventricular wall thickness. Tricuspid regurgitation signal is inadequate for assessing PA pressure. 2. The aortic valve was not well visualized. Aortic valve regurgitation is not  visualized. No aortic stenosis is present. 3. 4. The mitral valve was not well visualized. No evidence of mitral valve regurgitation. 5. Poor images secondary to patient factors.  Cardiac catheterization 11/11/2021:   Prox Cx lesion is 20% stenosed.   Mid LAD lesion is 30% stenosed.   The left ventricular ejection fraction is 25-35% by visual  estimate.   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   Emphysema, unspecified (HCC)    ETOH abuse    Fracture, intertrochanteric, right femur (HCC) 11/11/2014   Hypertension    Hypomagnesemia    Laceration of head 11/03/2021   Paroxysmal A-fib (HCC)    Syncope 12/07/2021   Tobacco abuse     Past Surgical History:  Procedure Laterality Date   BIOPSY  12/10/2021   Procedure: BIOPSY;  Surgeon: Napoleon Form, MD;  Location: [facility];  Service: Gastroenterology;;   ESOPHAGOGASTRODUODENOSCOPY (EGD) WITH PROPOFOL N/A 12/10/2021   Procedure: ESOPHAGOGASTRODUODENOSCOPY (EGD) WITH PROPOFOL;  Surgeon: Napoleon Form, MD;  Location: [facility];  Service: Gastroenterology;  Laterality: N/A;   HEMOSTASIS CLIP PLACEMENT  12/10/2021   Procedure: HEMOSTASIS CLIP PLACEMENT;  Surgeon: Napoleon Form, MD;  Location: [facility];  Service: Gastroenterology;;   HERNIA REPAIR     INTRAMEDULLARY (IM) NAIL INTERTROCHANTERIC Right 11/11/2014   Procedure: INTRAMEDULLARY (IM) NAIL INTERTROCHANTRIC;  Surgeon: Durene Romans, MD;  Location: [facility];  Service: Orthopedics;  Laterality: Right;   RIGHT/LEFT HEART CATH AND CORONARY ANGIOGRAPHY N/A 11/11/2021   Procedure: RIGHT/LEFT HEART CATH AND CORONARY ANGIOGRAPHY;  Surgeon: Dolores Patty, MD;  Location: [facility];  Service: Cardiovascular;  Laterality: N/A;   Rod right leg      Prior to Admission medications   Medication Sig Start Date End Date Taking? Authorizing Provider  albuterol (VENTOLIN HFA) 108 (90 Base) MCG/ACT inhaler Inhale 2 puffs into the lungs every 6 (six) hours as needed for wheezing or shortness of breath. 10/11/22   Hoy Register, MD  apixaban (ELIQUIS) 5 MG TABS tablet Take 1 tablet (5 mg total) by mouth 2 (two) times daily. 08/16/22   Claiborne Rigg, NP  budesonide-formoterol [facility]) 160-4.5 MCG/ACT inhaler Inhale 2 puffs into the  lungs 2 (two) times daily. 08/16/22   Hoy Register, MD  losartan (COZAAR) 100 MG tablet Take 1 tablet (100 mg total) by mouth daily. 10/25/22   Bensimhon, Bevelyn Buckles, MD  metoprolol succinate (TOPROL-XL) 25 MG 24 hr tablet Take 1 tablet (25 mg total) by mouth daily. 08/16/22   Claiborne Rigg, NP  pantoprazole (PROTONIX) 40 MG tablet Take 1 tablet (40 mg total) by mouth 2 (two) times daily. 08/16/22   Claiborne Rigg, NP  PEG-KCl-NaCl-NaSulf-Na Asc-C (PLENVU) 140 g SOLR Take 1 kit by mouth as directed. Use coupon: BIN: [id number] PNC: CNRX Group: [id number] ID: [id number] 09/20/22   Zehr, Shanda Bumps D, PA-C  potassium chloride SA (KLOR-CON M) 20 MEQ tablet Take 2 tablets (40 mEq total) by mouth daily at 12 noon for 1 day, THEN 1 tablet (20 mEq total) daily at 12 noon. 10/10/22 10/11/23  Jacklynn Ganong, FNP    Current Facility-Administered Medications  Medication Dose Route Frequency Provider Last Rate Last Admin   0.9 %  sodium chloride infusion   Intravenous Continuous Benjiman Core, MD 125 mL/hr at 11/22/22 1042 New Bag at 11/22/22 1042   Current Outpatient Medications  Medication Sig Dispense Refill   albuterol (VENTOLIN HFA)  108 (90 Base) MCG/ACT inhaler Inhale 2 puffs into the lungs every 6 (six) hours as needed for wheezing or shortness of breath. 18 g 0   apixaban (ELIQUIS) 5 MG TABS tablet Take 1 tablet (5 mg total) by mouth 2 (two) times daily. 180 tablet 1   budesonide-formoterol (SYMBICORT) 160-4.5 MCG/ACT inhaler Inhale 2 puffs into the lungs 2 (two) times daily. 10.2 g 3   losartan (COZAAR) 100 MG tablet Take 1 tablet (100 mg total) by mouth daily. 90 tablet 3   metoprolol succinate (TOPROL-XL) 25 MG 24 hr tablet Take 1 tablet (25 mg total) by mouth daily. 90 tablet 1   pantoprazole (PROTONIX) 40 MG tablet Take 1 tablet (40 mg total) by mouth 2 (two) times daily. 180 tablet 2   PEG-KCl-NaCl-NaSulf-Na Asc-C (PLENVU) 140 g SOLR Take 1 kit by mouth as directed. Use coupon: BIN: [id number] PNC:  CNRX Group: [id number] ID: [id number] 3 each 0   potassium chloride SA (KLOR-CON M) 20 MEQ tablet Take 2 tablets (40 mEq total) by mouth daily at 12 noon for 1 day, THEN 1 tablet (20 mEq total) daily at 12 noon. 31 tablet 6    Allergies as of 11/22/2022   (No Known Allergies)    Family History  Problem Relation Age of Onset   Diabetes Mother    Colon cancer Neg Hx    Stomach cancer Neg Hx    Esophageal cancer Neg Hx     Social History   Socioeconomic History   Marital status: Single    Spouse name: Not on file   Number of children: 1   Years of education: Not on file   Highest education level: Not on file  Occupational History   Occupation: retired  Tobacco Use   Smoking status: Some Days    Packs/day: 0.25    Years: 50.00    Additional pack years: 0.00    Total pack years: 12.50    Types: Cigarettes    Passive exposure: Never   Smokeless tobacco: Never  Vaping Use   Vaping Use: Never used  Substance and Sexual Activity   Alcohol use: Yes    Comment: occ   Drug use: Not Currently   Sexual activity: Yes  Other Topics Concern   Not on file  Social History Narrative   ** Merged History Encounter **       Social Determinants of Health   Financial Resource Strain: Not on file  Food Insecurity: Not on file  Transportation Needs: Not on file  Physical Activity: Not on file  Stress: Not on file  Social Connections: Not on file  Intimate Partner Violence: Not on file    Review of Systems: Gen: Denies fever, sweats or chills. No weight loss.  CV: Denies chest pain, palpitations or edema. Resp: Denies cough, shortness of breath of hemoptysis.  GI: See HPI.  GU : Denies urinary burning, blood in urine, increased urinary frequency or incontinence. MS: Denies joint pain, muscles aches or weakness. Derm: Denies rash, itchiness, skin lesions or unhealing ulcers. Psych: Denies depression, anxiety, memory loss or confusion. Heme: Denies easy bruising,  bleeding. Neuro:  Denies headaches, dizziness or paresthesias. Endo:  Denies any problems with DM, thyroid or adrenal function.  Physical Exam: Vital signs in last 24 hours: Temp:  [97.6 F (36.4 C)-97.7 F (36.5 C)] 97.7 F (36.5 C) (05/07 1014) Pulse Rate:  [80-104] 80 (05/07 1030) Resp:  [16-19] 16 (05/07 1014) BP: (151-173)/(95-102) 151/95 (05/07 1030) SpO2:  [95 %-  98 %] 95 % (05/07 1030) Weight:  [104.3 kg] 104.3 kg (05/07 1006)   General: 62 year old male in no acute distress. Head:  Normocephalic and atraumatic. Eyes:  No scleral icterus. Conjunctiva pink. Ears:  Normal auditory acuity. Nose:  No deformity, discharge or lesions. Mouth:  Dentition intact. No ulcers or lesions.  Neck:  Supple. No lymphadenopathy or thyromegaly.  Lungs: Breath sounds clear throughout. No wheezes, rhonchi or crackles.  Heart: Regular rate and rhythm, no murmurs. Abdomen: Obese abdomen, nontender distended.  Nontender.  Positive bowel sounds to all 4 quadrants.  No palpable mass. Rectal: No external hemorrhoids or fissures.  Anal hemorrhoids with erythema without active bleeding.  No blood or black stool in the rectal vault.  Medical assistant at bedside at time of exam. Musculoskeletal:  Symmetrical without gross deformities.  Pulses:  Normal pulses noted. Extremities:  Without clubbing or edema. Neurologic:  Alert and  oriented x 4. No focal deficits.  Skin:  Intact without significant lesions or rashes. Psych:  Alert and cooperative. Normal mood and affect.  Intake/Output from previous day: No intake/output data recorded. Intake/Output this shift: No intake/output data recorded.  Lab Results: Recent Labs    11/22/22 1038  WBC 5.6  HGB 13.3  HCT 39.5  PLT 225   BMET Recent Labs    11/22/22 1038  NA 139  K 3.8  CL 102  CO2 28  GLUCOSE 112*  BUN 8  CREATININE 0.80  CALCIUM 8.6*   LFT Recent Labs    11/22/22 1038  PROT 6.4*  ALBUMIN 3.1*  AST 30  ALT 21  ALKPHOS  110  BILITOT 1.6*   PT/INR Recent Labs    11/22/22 1038  LABPROT 15.0  INR 1.2    Studies/Results: No results found.  IMPRESSION/PLAN:  62 year old male presented to the ED after he developed hematochezia, dark red blood clot x 1 and bright red blood per the rectum x 2 after taking one dose of bowel prep for scheduled outpatient EGD/colonoscopy today. Endoscopic procedures were cancelled.  Hemoglobin 13.3 with a baseline hemoglobin level of 14 on 11/15/2022.  No active hematochezia since arriving to the ED.  Hemodynamically stable.  Last dose of Eliquis was on 11/18/2022. No current abdominal pain, had "gripping" lower abdominal pain for a few days two weeks ago and x 1 day on 5/4. Rectal exam without blood or stool.  -Repeat H/H pending -Recommend CTA if patient develops active GI bleeding  -Clear liquid diet for now -Admission pending GI recommendations  -EGD and colonoscopy as inpatient vs as outpatient, await further recommendations per Dr. Russella Dar  GERD, esophagitis. EGD 12/10/2021 identified grade D esophagitis with bleeding, clips placed, a 4 cm hiatal hernia, gastritis and duodenopathy. Treated with PPI bid with plans to repeat EGD in 3 months. Outpatient EGD cancelled today as noted above.  -Defer endoscopic recommendations to Dr. Russella Dar -PPI po bid  History of colon polyps per colonoscopy in 2014 or 2015  Atrial fibrillation, last dose of Eliquis as on 5/3  CHF. ECHO in office per Dr. Gala Romney 09/08/2022 with LVEF 60 -65%   Luis Marquez  11/22/2022, 1:25PM   Attending Physician Note   I have taken a history, reviewed the chart and examined the patient. I performed a substantive portion of this encounter, including complete performance of at least one of the key components, in conjunction with the APP. I agree with the APP's note, impression and recommendations with my edits. My additional impressions and recommendations  are as follows.   Hematochezia during bowel  prep for colonoscopy. Suspected diverticular bleed. R/O other causes. Colonoscopy was planned today with VN for a possible personal history of colon polyps vs CRC screening. Trend CBC. If significant recurrent bleeding proceed with CTA and if positive consult IR. Colonoscopy/EGD planned for Thursday.   GERD with LA Grade D esophagitis with bleeding on May 2023 EGD. Repeat EGD was planned today with VN.  Pantoprazole bid for now. EGD as above.  Afib. Continue to hold Eliquis.   Claudette Head, MD [facility] See AMION,  GI, for our on call provider

## 2022-11-22 NOTE — Telephone Encounter (Signed)
I will inform inpatient GI team that patient will be coming to ER due to significant rectal bleeding with large blood clots

## 2022-11-22 NOTE — Telephone Encounter (Signed)
Returned the patient's phone call . Spoke with the ED physician as well and he reported that the patient's Hg was WNL. Alcide Evener our AP for today will be evaluating the patient.

## 2022-11-22 NOTE — ED Triage Notes (Signed)
Patient was prepping for a colonoscopy since last night. The last time he had a bowel movement there was bright red blood twice. No abdominal pain. Took Eloquis on Friday.

## 2022-11-22 NOTE — Telephone Encounter (Signed)
Spoke with Dr. Lavon Paganini. Informed the patient that he should go to the ED for rectal bleeding. Pt verbalized understanding and is going to [employer].

## 2022-11-22 NOTE — ED Provider Notes (Addendum)
[place] EMERGENCY DEPARTMENT AT [facility] Provider Note   CSN: [id number] Arrival date & time: 11/22/22  1001     History  Chief Complaint  Patient presents with   Rectal Bleeding    Luis Marquez is a 62 y.o. male.   Rectal Bleeding Patient presents with rectal bleeding.  Is currently getting colonoscopy prep for planned colonoscopy later today.  States last night he had to go to the bathroom and had purple clots that he passed.  Was feeling okay.  Later this morning had just bloody red bowel movement.  No feeling lightheadedness or dizziness.  Is on Eliquis for atrial fibrillation but has not had since Friday with today being Tuesday.  The patient hasHemoglobin 7 days ago was 14.  This began yesterday evening.  Coffee-ground emesis.    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   Emphysema, unspecified (HCC)    ETOH abuse    Fracture, intertrochanteric, right femur (HCC) 11/11/2014   Hypertension    Hypomagnesemia    Laceration of head 11/03/2021   Paroxysmal A-fib (HCC)    Syncope 12/07/2021   Tobacco abuse     Home Medications Prior to Admission medications   Medication Sig Start Date End Date Taking? Authorizing Provider  albuterol (VENTOLIN HFA) 108 (90 Base) MCG/ACT inhaler Inhale 2 puffs into the lungs every 6 (six) hours as needed for wheezing or shortness of breath. 10/11/22   Hoy Register, MD  apixaban (ELIQUIS) 5 MG TABS tablet Take 1 tablet (5 mg total) by mouth 2 (two) times daily. 08/16/22   Claiborne Rigg, NP  budesonide-formoterol [facility]) 160-4.5 MCG/ACT inhaler Inhale 2 puffs into the lungs 2 (two) times daily. 08/16/22   Hoy Register, MD  losartan (COZAAR) 100 MG tablet Take 1 tablet (100 mg total) by mouth daily. 10/25/22   Bensimhon, Bevelyn Buckles, MD  metoprolol succinate (TOPROL-XL) 25 MG 24 hr tablet Take 1 tablet (25 mg total) by mouth daily. 08/16/22   Claiborne Rigg, NP  pantoprazole (PROTONIX) 40 MG tablet Take 1 tablet (40 mg total) by mouth 2 (two) times daily. 08/16/22   Claiborne Rigg, NP  PEG-KCl-NaCl-NaSulf-Na Asc-C (PLENVU) 140 g SOLR Take 1 kit by mouth as directed. Use coupon: BIN: [id number] PNC: CNRX Group: [id number] ID: [id number] 09/20/22   Zehr, Shanda Bumps D, PA-C  potassium chloride SA (KLOR-CON M) 20 MEQ tablet Take 2 tablets (40 mEq total) by mouth daily at 12 noon for 1 day, THEN 1 tablet (20 mEq total) daily at 12 noon. 10/10/22 10/11/23  Jacklynn Ganong, FNP      Allergies    Patient has no known allergies.    Review of Systems   Review of Systems  Gastrointestinal:  Positive for hematochezia.    Physical Exam Updated Vital Signs BP (!) 153/97   Pulse 80   Temp 97.7 F (36.5 C) (Oral)   Resp (!) 22   Ht 6\' 1"  (1.854 m)   Wt 104.3 kg   SpO2 95%   BMI 30.34 kg/m  Physical Exam Vitals and nursing note reviewed.  HENT:     Head: Atraumatic.  Cardiovascular:     Rate and Rhythm: Regular rhythm.  Pulmonary:     Breath sounds: No wheezing.  Abdominal:     Tenderness: There is no abdominal tenderness.  Musculoskeletal:        General: No tenderness.  Skin:  Capillary Refill: Capillary refill takes less than 2 seconds.  Neurological:     Mental Status: He is alert and oriented to person, place, and time.     ED Results / Procedures / Treatments   Labs (all labs ordered are listed, but only abnormal results are displayed) Labs Reviewed  COMPREHENSIVE METABOLIC PANEL - Abnormal; Notable for the following components:      Result Value   Glucose, Bld 112 (*)    Calcium 8.6 (*)    Total Protein 6.4 (*)    Albumin 3.1 (*)    Total Bilirubin 1.6 (*)    All other components within normal limits  CBC WITH DIFFERENTIAL/PLATELET - Abnormal; Notable for the following components:   RBC 3.98 (*)    All other components within normal limits  PROTIME-INR  CBC  TYPE AND SCREEN    EKG EKG  Interpretation  Date/Time:  Tuesday Nov 22 2022 10:43:13 EDT Ventricular Rate:  77 PR Interval:  180 QRS Duration: 127 QT Interval:  437 QTC Calculation: 495 R Axis:   45 Text Interpretation: Sinus rhythm Right bundle branch block No significant change since last tracing Confirmed by Benjiman Core [phone]) on 11/22/2022 10:49:17 AM  Radiology No results found.  Procedures Procedures    Medications Ordered in ED Medications  0.9 %  sodium chloride infusion ( Intravenous New Bag/Given 11/22/22 1042)    ED Course/ Medical Decision Making/ A&P                             Medical Decision Making Amount and/or Complexity of Data Reviewed Labs: ordered.  Risk Prescription drug management. Decision regarding hospitalization.   Patient presents with rectal bleeding.  Has passed red blood.  Due for EGD and colonoscopy today.  Was going to be done at the hospital due to being higher risk. 6.  Is on Eliquis but has not had since Friday.  Vital signs have anything hypertensive.  However is higher risk.  Will check basic blood work.  Will discuss with gastroenterology.  Have reviewed recent GI notes.  First hemoglobin is reassuring.  Will recheck.  Continues to be if anything hypertensive.  Discussed with [place] gastro[facility] who send inpatient.  After discussion with Dr. Russella Dar will admit for monitoring and potentially procedure if possible.  Will discuss with hospitalist.  Repeat hemoglobin now mildly decreased at 12.6.       Final Clinical Impression(s) / ED Diagnoses Final diagnoses:  Rectal bleeding    Rx / DC Orders ED Discharge Orders     None         Benjiman Core, MD 11/22/22 1328    Benjiman Core, MD 11/22/22 1345

## 2022-11-22 NOTE — H&P (Signed)
History and Physical    Rasha Olivio NWG:[id number] DOB: 10-05-1960 DOA: 11/22/2022  PCP: Claiborne Rigg, NP (Confirm with patient/family/NH records and if not entered, this has to be entered at [facility] point of entry) Patient coming from: Home  I have personally briefly reviewed patient's old medical records in [facility]  Chief Complaint: GI bleed  HPI: Luis Marquez is a 62 y.o. male with medical history significant of chronic HFrEF with recent LVEF 42% on stress test, nonischemic cardiomyopathy, PAF on Eliquis, COPD, alcohol abuse, diverticulosis, came with rectal bleed.  Patient has been doing GI prep since yesterday morning for a incoming elective per 10 years screening colonoscopy today.  After multiple episodes of bowel movements after drinking the liquid preparation fluid, patient started to have rectal bleeding.  3 episode since this morning.  Including the first episode passed some blood clots with light pinkish fluid then a second episode more bright red blood and the third episode was purpleish in color.  Denies any abdominal pain no nauseous vomiting, no chest pain no shortness of breath.  His last dose of Eliquis was 5 days ago, and he denies any chronic bleeding problems.  ED Course: Borderline tachycardia, blood pressure singly elevated 160/100. Blood work showed hemoglobin 13.3 trending down to 12.6 in 3 hours.  Review of Systems: As per HPI otherwise 14 point review of systems negative.    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   Emphysema, unspecified (HCC)    ETOH abuse    Fracture, intertrochanteric, right femur (HCC) 11/11/2014   Hypertension    Hypomagnesemia    Laceration of head 11/03/2021   Paroxysmal A-fib (HCC)    Syncope 12/07/2021   Tobacco abuse     Past Surgical History:  Procedure Laterality Date   BIOPSY  12/10/2021   Procedure: BIOPSY;  Surgeon: Napoleon Form,  MD;  Location: [facility];  Service: Gastroenterology;;   ESOPHAGOGASTRODUODENOSCOPY (EGD) WITH PROPOFOL N/A 12/10/2021   Procedure: ESOPHAGOGASTRODUODENOSCOPY (EGD) WITH PROPOFOL;  Surgeon: Napoleon Form, MD;  Location: [facility];  Service: Gastroenterology;  Laterality: N/A;   HEMOSTASIS CLIP PLACEMENT  12/10/2021   Procedure: HEMOSTASIS CLIP PLACEMENT;  Surgeon: Napoleon Form, MD;  Location: [facility];  Service: Gastroenterology;;   HERNIA REPAIR     INTRAMEDULLARY (IM) NAIL INTERTROCHANTERIC Right 11/11/2014   Procedure: INTRAMEDULLARY (IM) NAIL INTERTROCHANTRIC;  Surgeon: Durene Romans, MD;  Location: [facility];  Service: Orthopedics;  Laterality: Right;   RIGHT/LEFT HEART CATH AND CORONARY ANGIOGRAPHY N/A 11/11/2021   Procedure: RIGHT/LEFT HEART CATH AND CORONARY ANGIOGRAPHY;  Surgeon: Dolores Patty, MD;  Location: [facility];  Service: Cardiovascular;  Laterality: N/A;   Rod right leg       reports that he has been smoking cigarettes. He has a 12.50 pack-year smoking history. He has never been exposed to tobacco smoke. He has never used smokeless tobacco. He reports current alcohol use. He reports that he does not currently use drugs.  No Known Allergies  Family History  Problem Relation Age of Onset   Diabetes Mother    Colon cancer Neg Hx    Stomach cancer Neg Hx    Esophageal cancer Neg Hx      Prior to Admission medications   Medication Sig Start Date End Date Taking? Authorizing Provider  albuterol (VENTOLIN HFA) 108 (90 Base) MCG/ACT inhaler Inhale 2 puffs into the lungs every 6 (  six) hours as needed for wheezing or shortness of breath. 10/11/22  Yes Newlin, Enobong, MD  apixaban (ELIQUIS) 5 MG TABS tablet Take 1 tablet (5 mg total) by mouth 2 (two) times daily. 08/16/22  Yes Claiborne Rigg, NP  budesonide-formoterol (SYMBICORT) 160-4.5 MCG/ACT inhaler Inhale 2 puffs into the lungs 2 (two) times daily. 08/16/22  Yes Hoy Register, MD  losartan  (COZAAR) 100 MG tablet Take 1 tablet (100 mg total) by mouth daily. 10/25/22  Yes Bensimhon, Bevelyn Buckles, MD  metoprolol succinate (TOPROL-XL) 25 MG 24 hr tablet Take 1 tablet (25 mg total) by mouth daily. 08/16/22  Yes Claiborne Rigg, NP  pantoprazole (PROTONIX) 40 MG tablet Take 1 tablet (40 mg total) by mouth 2 (two) times daily. 08/16/22  Yes Claiborne Rigg, NP  potassium chloride SA (KLOR-CON M) 20 MEQ tablet Take 2 tablets (40 mEq total) by mouth daily at 12 noon for 1 day, THEN 1 tablet (20 mEq total) daily at 12 noon. 10/10/22 10/11/23 Yes Milford, Anderson Malta, FNP  PEG-KCl-NaCl-NaSulf-Na Asc-C (PLENVU) 140 g SOLR Take 1 kit by mouth as directed. Use coupon: BIN: [id number] PNC: CNRX Group: [id number] ID: [id number] 09/20/22   Leta Baptist, PA-C    Physical Exam: Vitals:   11/22/22 1300 11/22/22 1315 11/22/22 1415 11/22/22 1422  BP: (!) 165/129 (!) 153/97 (!) 164/103   Pulse: 84 80 74   Resp: 17 (!) 22    Temp:    97.9 F (36.6 C)  TempSrc:    Oral  SpO2: 96% 95% 94%   Weight:      Height:        Constitutional: NAD, calm, comfortable Vitals:   11/22/22 1300 11/22/22 1315 11/22/22 1415 11/22/22 1422  BP: (!) 165/129 (!) 153/97 (!) 164/103   Pulse: 84 80 74   Resp: 17 (!) 22    Temp:    97.9 F (36.6 C)  TempSrc:    Oral  SpO2: 96% 95% 94%   Weight:      Height:       Eyes: PERRL, lids and conjunctivae normal ENMT: Mucous membranes are moist. Posterior pharynx clear of any exudate or lesions.Normal dentition.  Neck: normal, supple, no masses, no thyromegaly Respiratory: clear to auscultation bilaterally, no wheezing, no crackles. Normal respiratory effort. No accessory muscle use.  Cardiovascular: Regular rate and rhythm, no murmurs / rubs / gallops. No extremity edema. 2+ pedal pulses. No carotid bruits.  Abdomen: no tenderness, no masses palpated. No hepatosplenomegaly. Bowel sounds positive.  Musculoskeletal: no clubbing / cyanosis. No joint deformity upper and lower  extremities. Good ROM, no contractures. Normal muscle tone.  Skin: no rashes, lesions, ulcers. No induration Neurologic: CN 2-12 grossly intact. Sensation intact, DTR normal. Strength 5/5 in all 4.  Psychiatric: Normal judgment and insight. Alert and oriented x 3. Normal mood.     Labs on Admission: I have personally reviewed following labs and imaging studies  CBC: Recent Labs  Lab 11/15/22 1529 11/22/22 1038 11/22/22 1328  WBC 7.0 5.6 6.2  NEUTROABS 5.0 3.9  --   HGB 14.0 13.3 12.6*  HCT 40.6 39.5 37.2*  MCV 98* 99.2 98.7  PLT 292 225 224   Basic Metabolic Panel: Recent Labs  Lab 11/15/22 1529 11/22/22 1038  NA 139 139  K 4.0 3.8  CL 99 102  CO2 21 28  GLUCOSE 95 112*  BUN 9 8  CREATININE 1.03 0.80  CALCIUM 9.4 8.6*   GFR: Estimated Creatinine Clearance: 118.4  mL/min (by C-G formula based on SCr of 0.8 mg/dL). Liver Function Tests: Recent Labs  Lab 11/15/22 1529 11/22/22 1038  AST 30 30  ALT 20 21  ALKPHOS 133* 110  BILITOT 0.3 1.6*  PROT 6.6 6.4*  ALBUMIN 3.6* 3.1*   No results for input(s): "LIPASE", "AMYLASE" in the last 168 hours. No results for input(s): "AMMONIA" in the last 168 hours. Coagulation Profile: Recent Labs  Lab 11/22/22 1038  INR 1.2   Cardiac Enzymes: No results for input(s): "CKTOTAL", "CKMB", "CKMBINDEX", "TROPONINI" in the last 168 hours. BNP (last 3 results) No results for input(s): "PROBNP" in the last 8760 hours. HbA1C: No results for input(s): "HGBA1C" in the last 72 hours. CBG: No results for input(s): "GLUCAP" in the last 168 hours. Lipid Profile: No results for input(s): "CHOL", "HDL", "LDLCALC", "TRIG", "CHOLHDL", "LDLDIRECT" in the last 72 hours. Thyroid Function Tests: No results for input(s): "TSH", "T4TOTAL", "FREET4", "T3FREE", "THYROIDAB" in the last 72 hours. Anemia Panel: No results for input(s): "VITAMINB12", "FOLATE", "FERRITIN", "TIBC", "IRON", "RETICCTPCT" in the last 72 hours. Urine analysis:     Component Value Date/Time   COLORURINE AMBER (A) 12/08/2021 0128   APPEARANCEUR CLOUDY (A) 12/08/2021 0128   LABSPEC 1.013 12/08/2021 0128   PHURINE 5.0 12/08/2021 0128   GLUCOSEU NEGATIVE 12/08/2021 0128   HGBUR NEGATIVE 12/08/2021 0128   BILIRUBINUR NEGATIVE 12/08/2021 0128   KETONESUR NEGATIVE 12/08/2021 0128   PROTEINUR NEGATIVE 12/08/2021 0128   NITRITE NEGATIVE 12/08/2021 0128   LEUKOCYTESUR TRACE (A) 12/08/2021 0128    Radiological Exams on Admission: No results found.  EKG: Independently reviewed.  Sinus, chronic RBBB, no acute ST changes.  Assessment/Plan Principal Problem:   GI bleed Active Problems:   Chronic systolic CHF (congestive heart failure) (HCC)   Hematochezia   Diverticulosis of intestine with bleeding  (please populate well all problems here in Problem List. (For example, if patient is on BP meds at home and you resume or decide to hold them, it is a problem that needs to be her. Same for CAD, COPD, HLD and so on)  Hematochezia -Clinically suspect bleeding source is diverticulosis as patient's Hx and previous CT abd has showed -Last time passed rectal bleeding was >6 hours ago and H/H appears to be be stabilized -Trend H/H this afternoon and tonight and transfuse to hemodynamic instability -[facility] consulted, EGD and colonoscopy as per GI.  Patient was recently seen by cardiology for preop clearance for hip surgery and he was cleared by cardiology.  Medically stable for EGD colonoscopy with acceptable risk -Other Ddx, low suspicion for upper GI bleed, patient was started on double dose PPI in the ED.  Patient does have history of alcohol abuse, however clinically and lab wise anemic twice no symptoms or signs of cirrhosis/hypertension.  Hold off Sandostatin.  HTN, poorly controlled -Change metoprolol to Coreg -Losartan dosage -As needed hydralazine  History of PAF -Continue to hold off Eliquis -SCD for DVT prophylaxis  Chronic  HFrEF -Euvolemic -Hold off IV fluid -Blood pressure control as above  History of alcohol abuse -No symptoms or signs of active withdrawal, start CIWA protocol with as needed benzos   DVT prophylaxis: SCD Code Status: Full code Family Communication: None at bedside Disposition Plan: Patient is sick with lower GI bleed, requiring inpatient GI workup, expect more than 2 midnight hospital stay. Consults called: GI Admission status: Tele admit   Emeline General MD [facility] Pager [phone]  11/22/2022, 3:04 PM

## 2022-11-22 NOTE — Telephone Encounter (Signed)
Patient called requesting to speak with Maralyn Sago regarding previous notes. States he did go to ED. Please advise, thank you.

## 2022-11-23 DIAGNOSIS — E669 Obesity, unspecified: Secondary | ICD-10-CM | POA: Diagnosis not present

## 2022-11-23 DIAGNOSIS — I5022 Chronic systolic (congestive) heart failure: Secondary | ICD-10-CM | POA: Diagnosis not present

## 2022-11-23 DIAGNOSIS — K625 Hemorrhage of anus and rectum: Secondary | ICD-10-CM

## 2022-11-23 DIAGNOSIS — K219 Gastro-esophageal reflux disease without esophagitis: Secondary | ICD-10-CM | POA: Diagnosis not present

## 2022-11-23 DIAGNOSIS — K921 Melena: Secondary | ICD-10-CM | POA: Diagnosis not present

## 2022-11-23 DIAGNOSIS — D62 Acute posthemorrhagic anemia: Secondary | ICD-10-CM | POA: Diagnosis not present

## 2022-11-23 DIAGNOSIS — I4891 Unspecified atrial fibrillation: Secondary | ICD-10-CM | POA: Diagnosis not present

## 2022-11-23 LAB — CBC
HCT: 33.7 % — ABNORMAL LOW (ref 39.0–52.0)
Hemoglobin: 11.4 g/dL — ABNORMAL LOW (ref 13.0–17.0)
MCH: 33.4 pg (ref 26.0–34.0)
MCHC: 33.8 g/dL (ref 30.0–36.0)
MCV: 98.8 fL (ref 80.0–100.0)
Platelets: 195 10*3/uL (ref 150–400)
RBC: 3.41 MIL/uL — ABNORMAL LOW (ref 4.22–5.81)
RDW: 13.2 % (ref 11.5–15.5)
WBC: 5.8 10*3/uL (ref 4.0–10.5)
nRBC: 0 % (ref 0.0–0.2)

## 2022-11-23 LAB — HIV ANTIBODY (ROUTINE TESTING W REFLEX): HIV Screen 4th Generation wRfx: NONREACTIVE

## 2022-11-23 IMAGING — CT CT CERVICAL SPINE W/O CM
4 series · 14 of 35 positions shown, 16 images · non-contrast
Comparison: None Available.

CLINICAL DATA: Status post fall.



[Series 3: c-spine 2.0 st · axial · 0.33mm/px · z∈[-90,+6]mm · 3 of 97 slices shown, 4 images]
[im 25/97  soft-tissue]
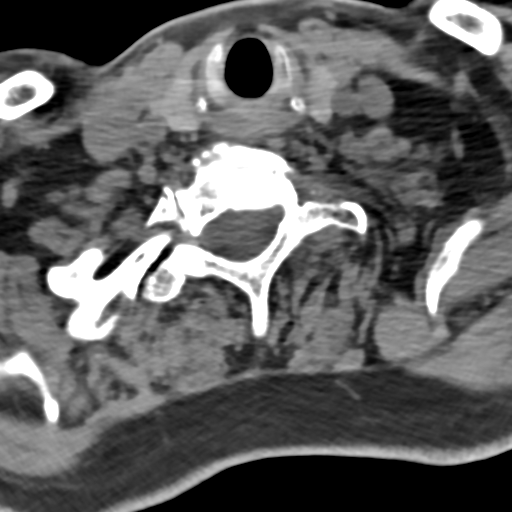
[im 25/97  bone]
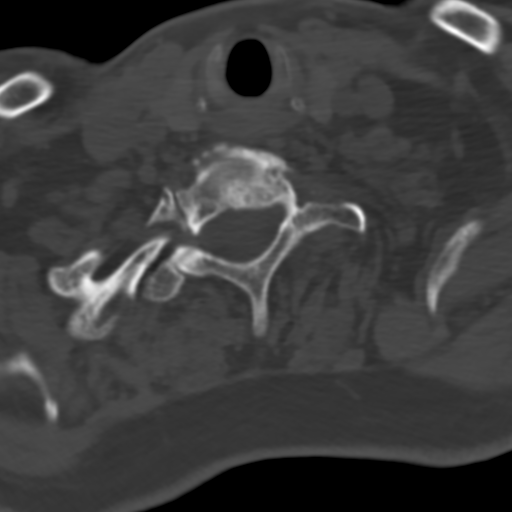
[im 49/97  bone]
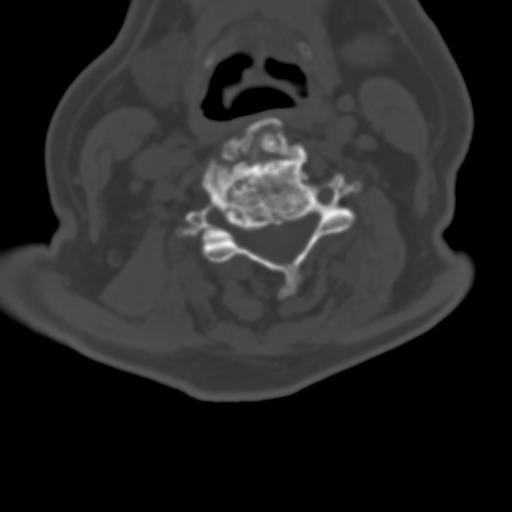
[im 73/97  bone]
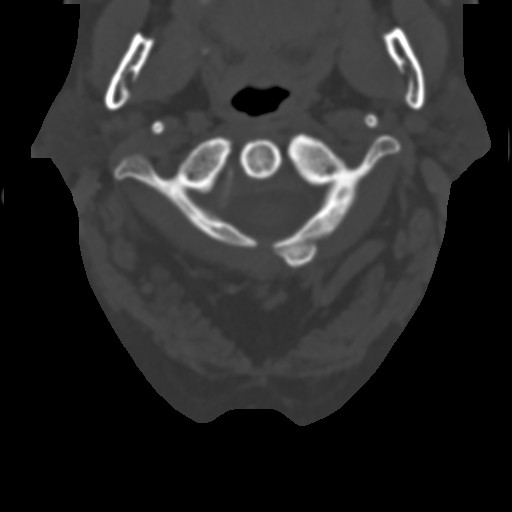

[Series 7: sagittal soft tissue · axial · 0.33mm/px · z∈[-114,-66]mm · 3 of 193 slices shown]
[im 25/193  soft-tissue]
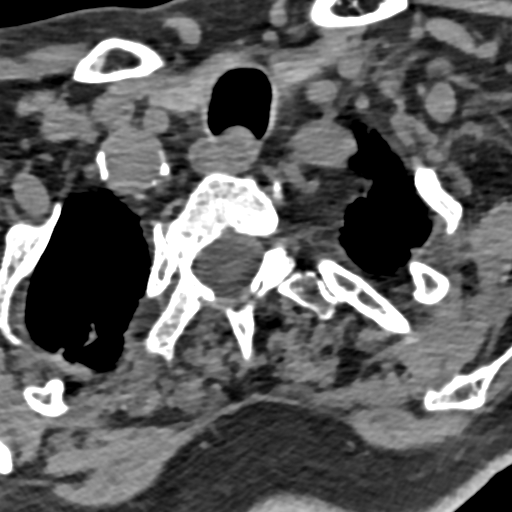
[im 49/193  soft-tissue]
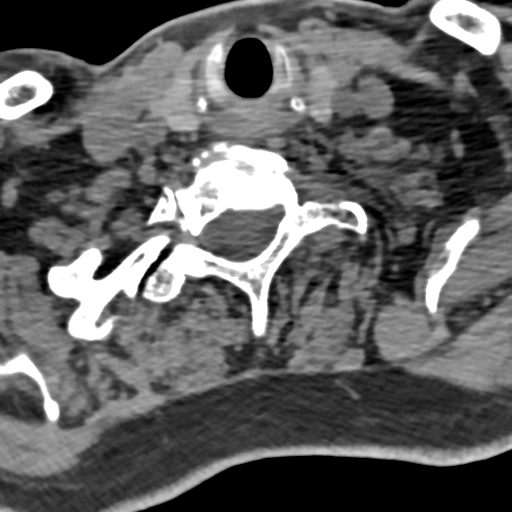
[im 73/193  soft-tissue]
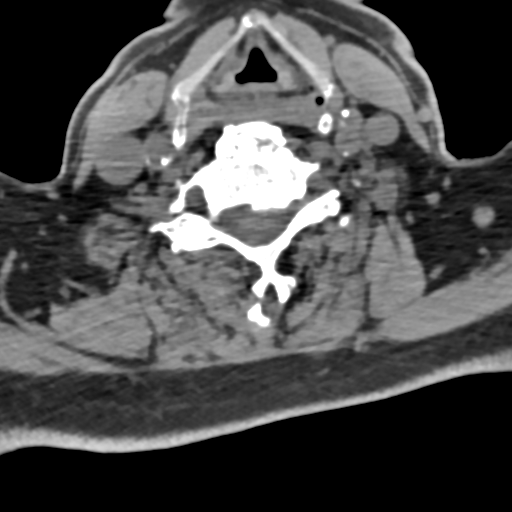

[Series 9: coronal bone · coronal · 0.27mm/px · 3 of 85 slices shown]
[im 17/85  bone]
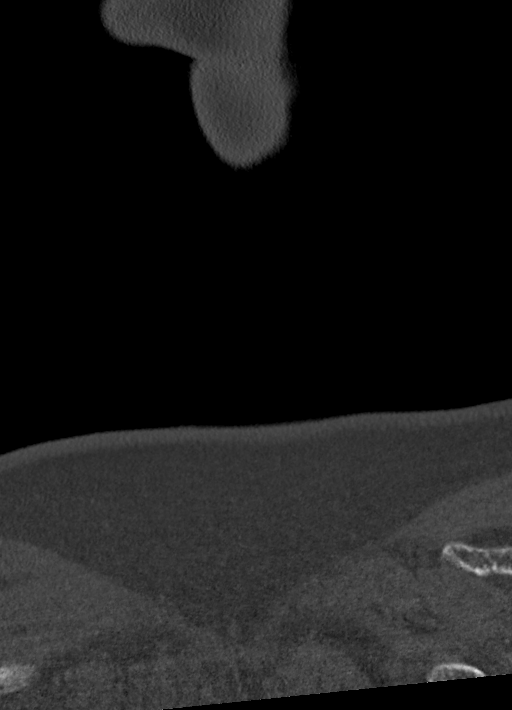
[im 34/85  bone]
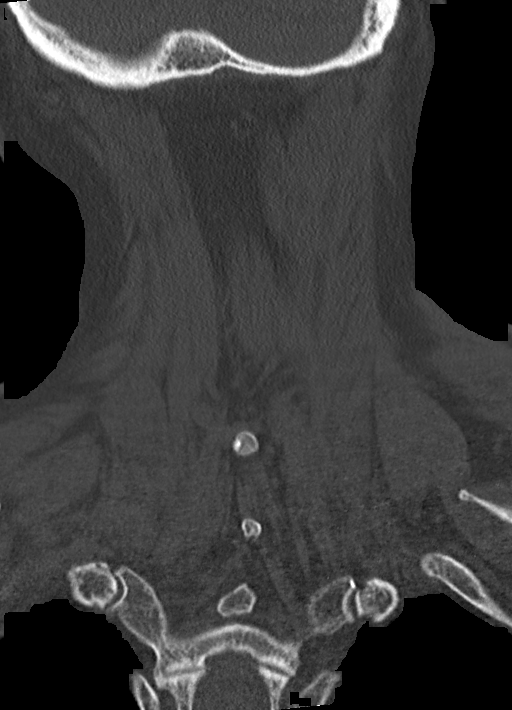
[im 51/85  bone]
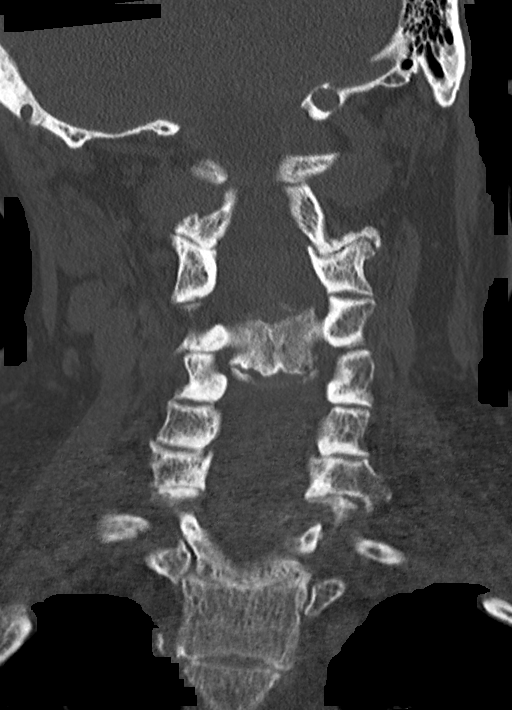

[Series 10: sag bone · sagittal · 0.31mm/px · 5 of 76 slices shown, 6 images]
[im 26/76  bone]
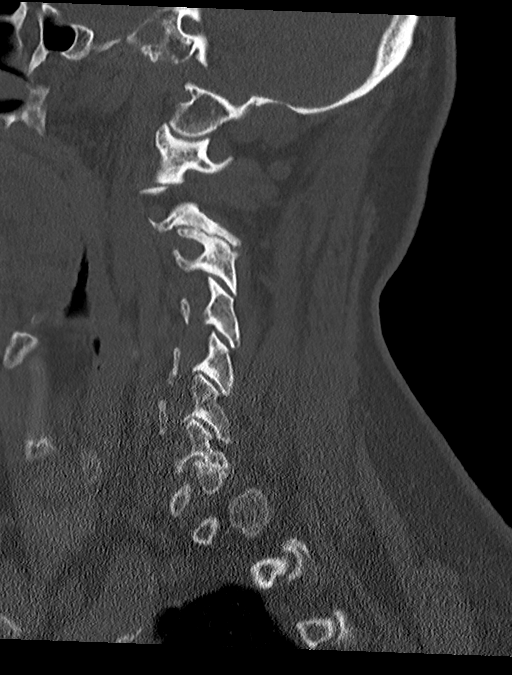
[im 32/76  bone]
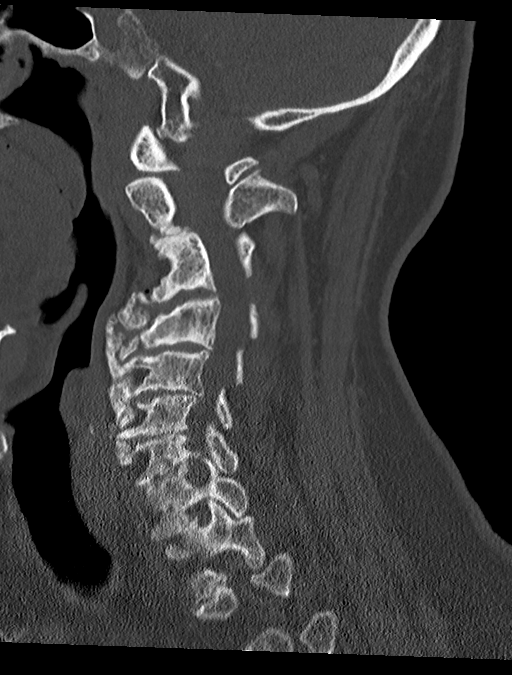
[im 38/76  soft-tissue]
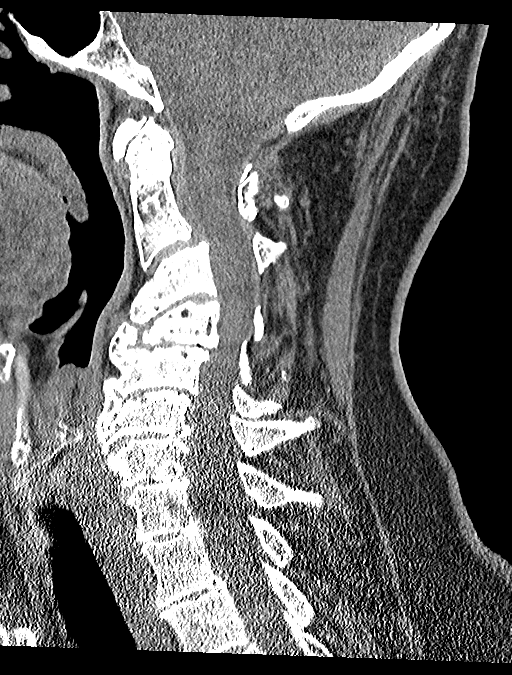
[im 38/76  bone]
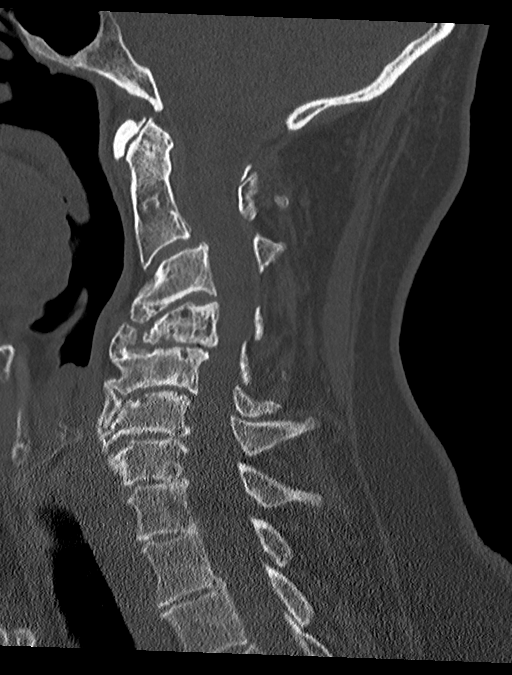
[im 44/76  bone]
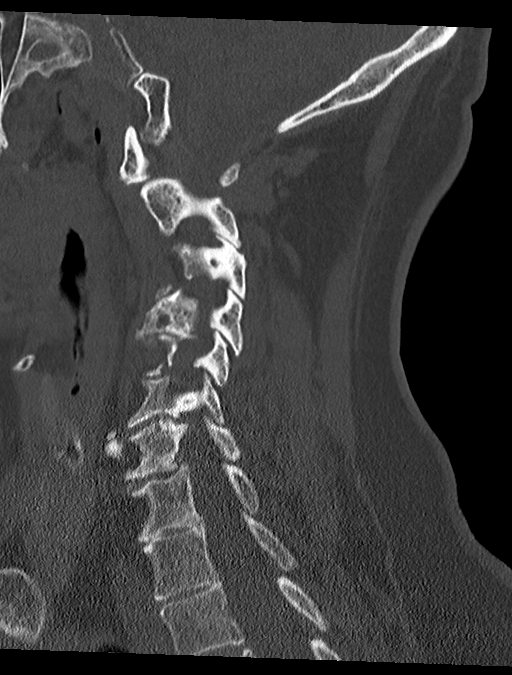
[im 51/76  bone]
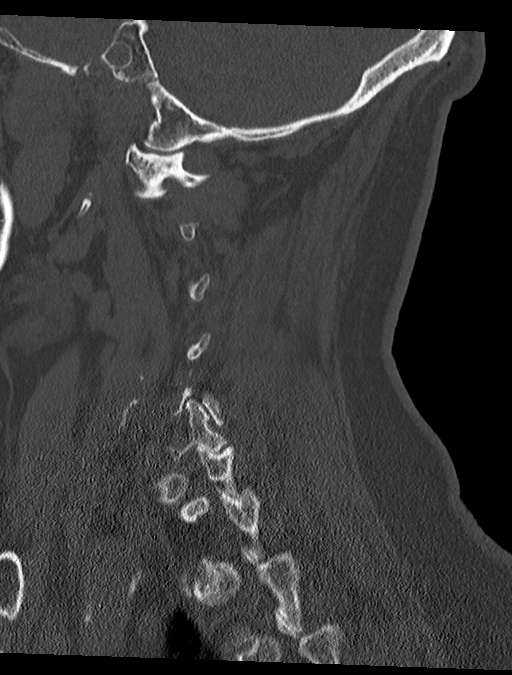

[14 of 35 positions shown; findings below may reference images not displayed]

FINDINGS: Alignment: There is reversal of the normal cervical spine lordosis
with 3.5 mm anterolisthesis of the C2 vertebral body on C3.
Approximately 3 mm retrolisthesis of C4 is noted on C5.

Skull base and vertebrae: No acute fracture. No primary bone lesion
or focal pathologic process.

Soft tissues and spinal canal: No prevertebral fluid or swelling. No
visible canal hematoma.

Disc levels: There is marked severity endplate sclerosis and
anterior osteophyte formation at the levels of C3-C4, C4-C5, C5-C6
and C6-C7.

Marked severity intervertebral disc space narrowing is also seen at
these levels.

Bilateral marked severity multilevel facet joint hypertrophy is
noted.

Upper chest: Negative.

Other: None.
IMPRESSION: 1. Marked severity multilevel degenerative changes without evidence
of an acute fracture.

## 2022-11-23 IMAGING — CT CT HEAD W/O CM
3 series · 16 of 47 positions shown, 19 images · non-contrast
Comparison: November 02, 2021

CLINICAL DATA: Status post fall.



[Series 3: head 5.0 h30s · axial · 0.48mm/px · z∈[+42,+182]mm · 10 of 34 slices shown, 13 images]
[im 3/34  brain]
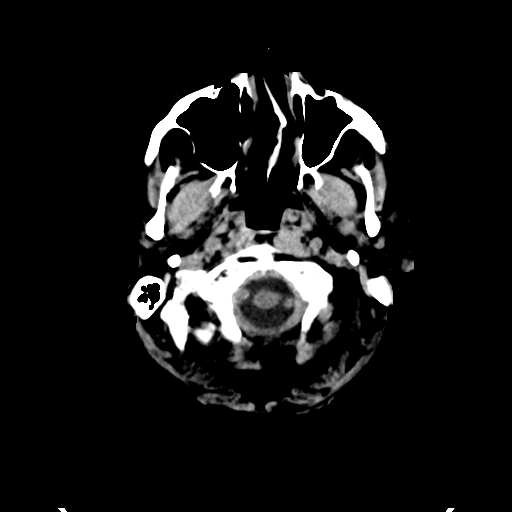
[im 3/34  bone]
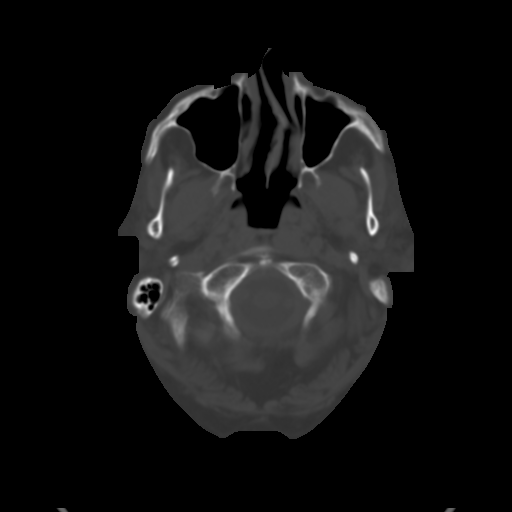
[im 6/34  brain]
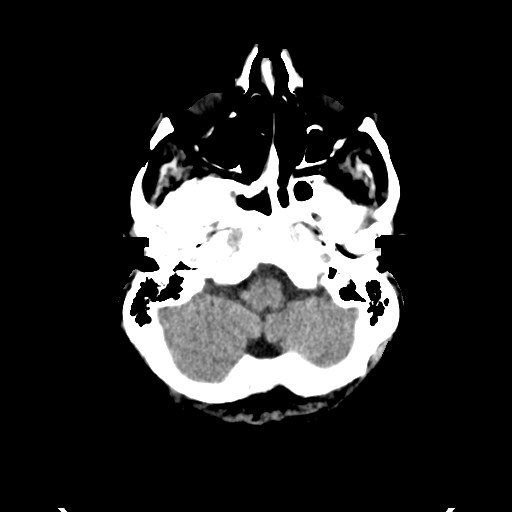
[im 10/34  brain]
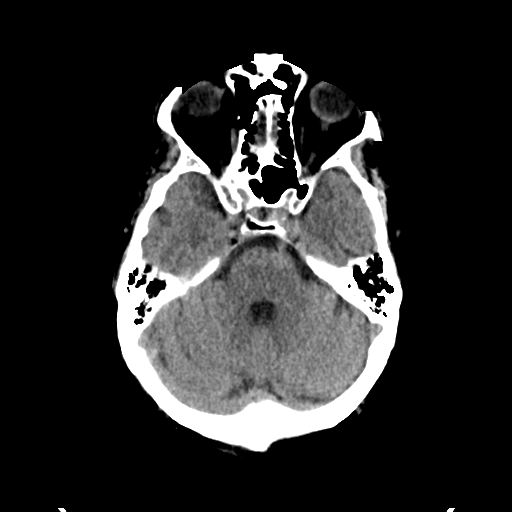
[im 12/34  brain]
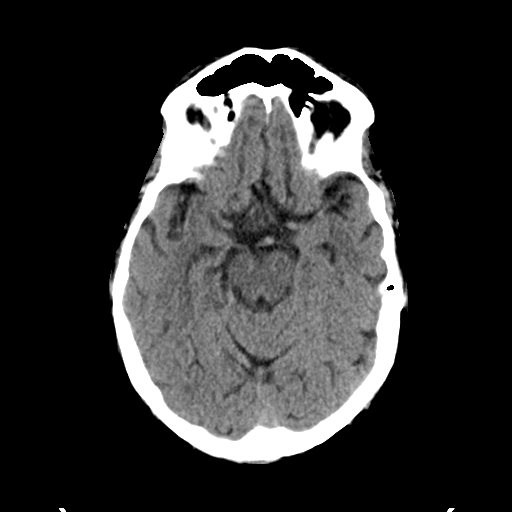
[im 15/34  brain]
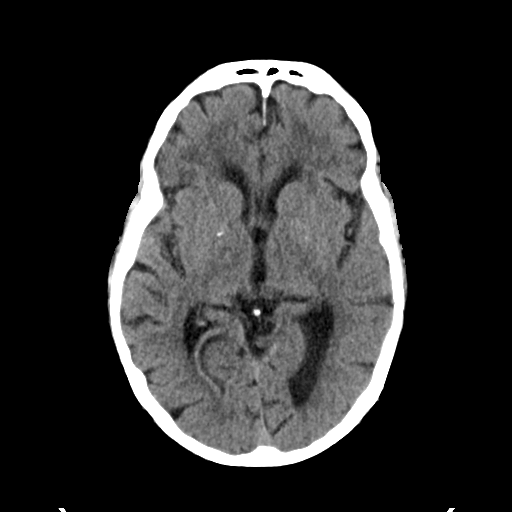
[im 15/34  bone]
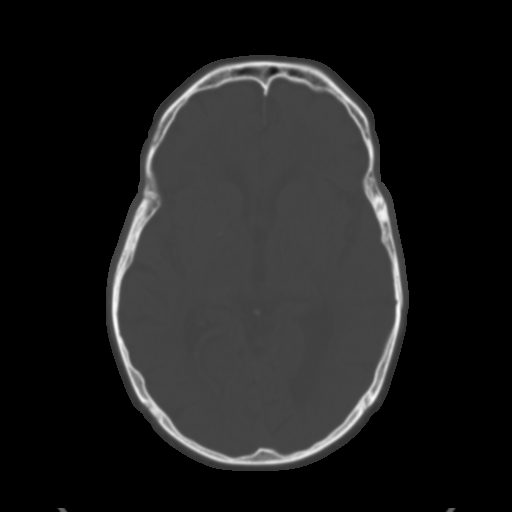
[im 19/34  brain]
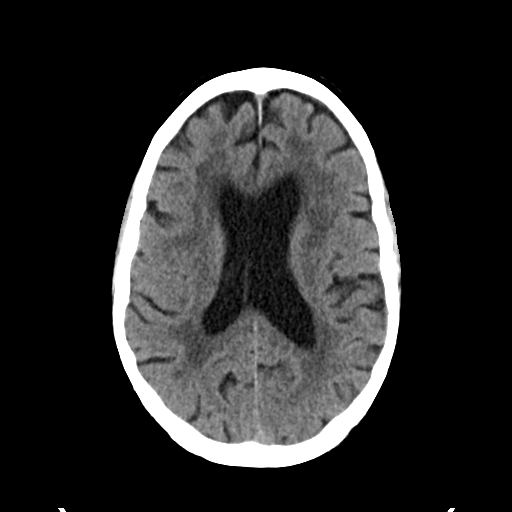
[im 22/34  brain]
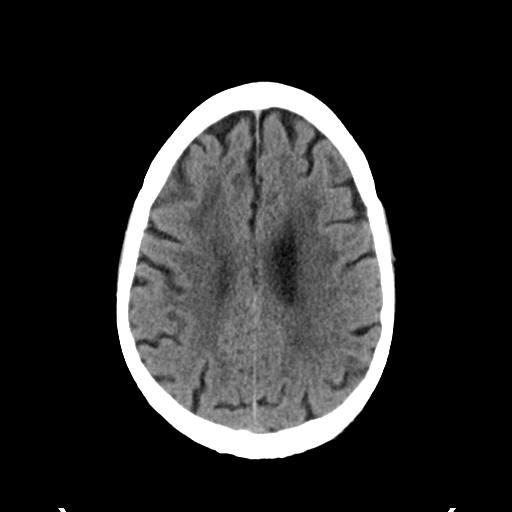
[im 26/34  brain]
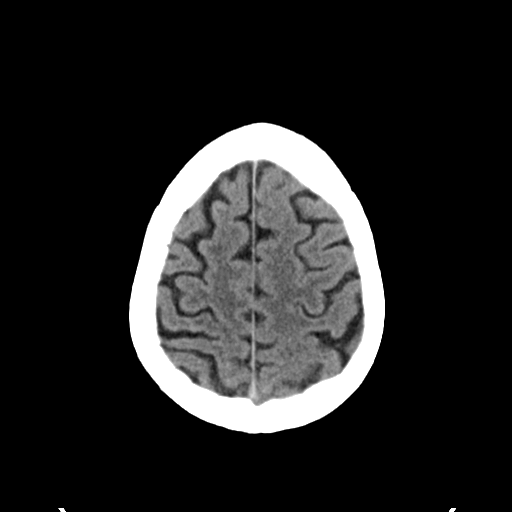
[im 28/34  brain]
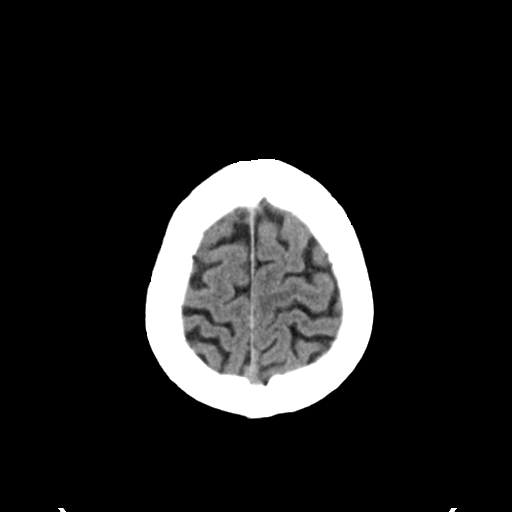
[im 28/34  bone]
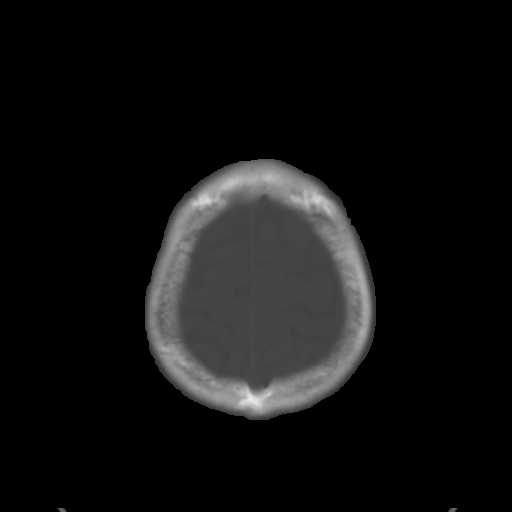
[im 31/34  brain]
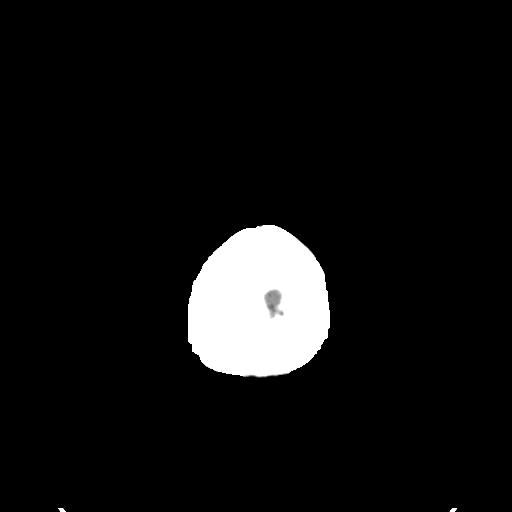

[Series 5: head 3.0 mpr cor · coronal · 0.35mm/px · 3 of 73 slices shown]
[im 25/73  brain]
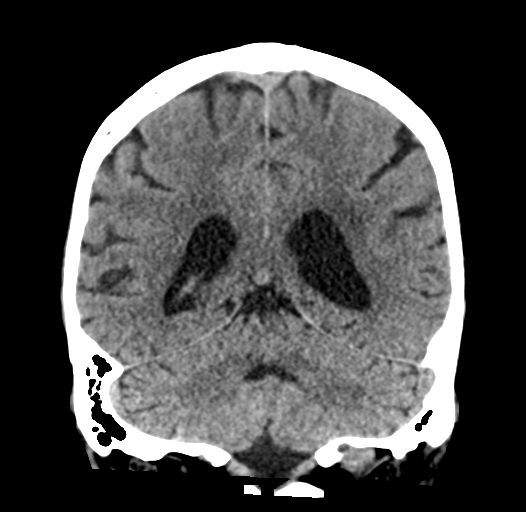
[im 33/73  brain]
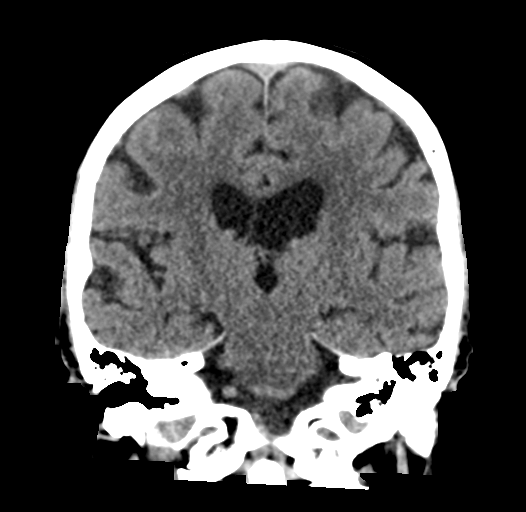
[im 41/73  brain]
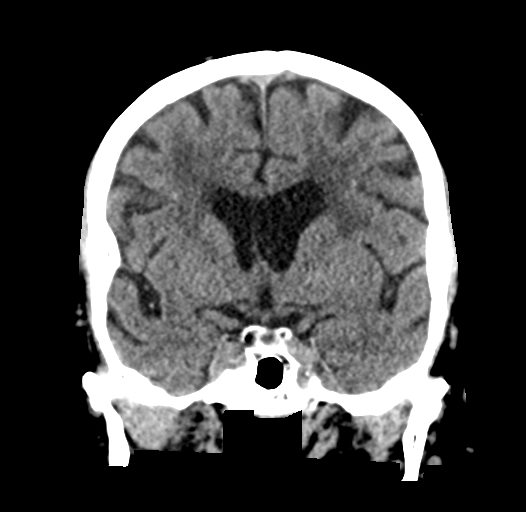

[Series 6: head 3.0 mpr sag · sagittal · 0.34mm/px · 3 of 58 slices shown]
[im 20/58  brain]
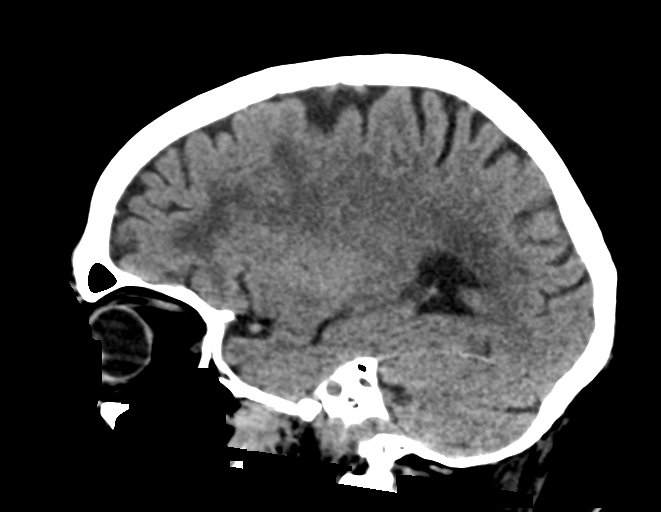
[im 29/58  brain]
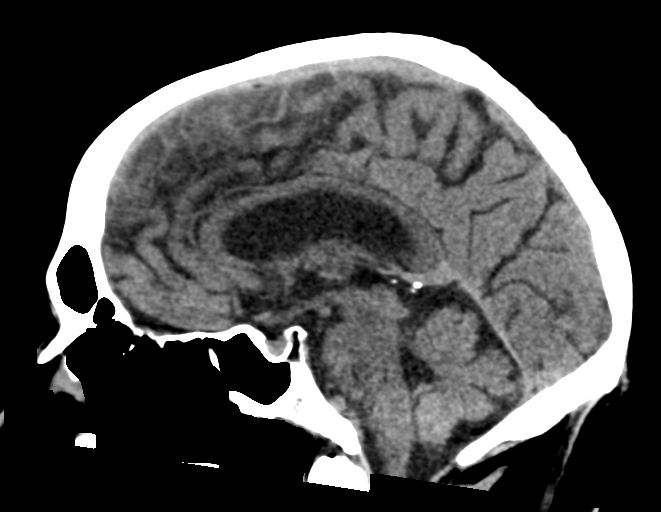
[im 39/58  brain]
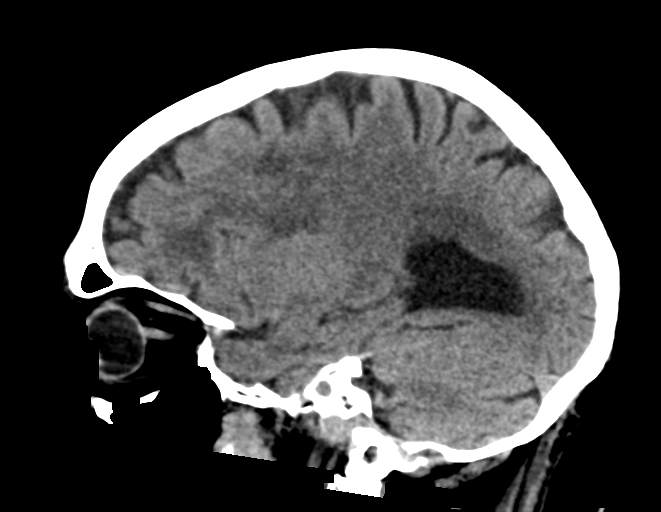

[16 of 47 positions shown; findings below may reference images not displayed]

FINDINGS: Brain: There is mild cerebral atrophy with widening of the
extra-axial spaces and ventricular dilatation.
There are areas of decreased attenuation within the white matter
tracts of the supratentorial brain, consistent with microvascular
disease changes.

Vascular: No hyperdense vessel or unexpected calcification.

Skull: Normal. Negative for fracture or focal lesion.

Sinuses/Orbits: No acute finding.

Other: None.
IMPRESSION: 1. No acute intracranial abnormality.
2. Generalized cerebral atrophy with chronic white matter small
vessel ischemic changes.

## 2022-11-23 MED ORDER — MELATONIN 3 MG PO TABS
6.0000 mg | ORAL_TABLET | Freq: Every evening | ORAL | Status: DC | PRN
  Administered 2022-11-24 – 2022-11-25 (×2): 6 mg via ORAL
  Filled 2022-11-23 (×3): qty 2

## 2022-11-23 MED ORDER — PEG-KCL-NACL-NASULF-NA ASC-C 100 G PO SOLR
0.5000 | Freq: Once | ORAL | Status: AC
  Administered 2022-11-23: 100 g via ORAL
  Filled 2022-11-23: qty 1

## 2022-11-23 MED ORDER — PEG-KCL-NACL-NASULF-NA ASC-C 100 G PO SOLR: 1.0000 | Freq: Once | ORAL | Status: DC

## 2022-11-23 MED ORDER — PEG-KCL-NACL-NASULF-NA ASC-C 100 G PO SOLR
0.5000 | Freq: Once | ORAL | Status: AC
  Administered 2022-11-23: 100 g via ORAL

## 2022-11-23 MED ORDER — ACETAMINOPHEN 325 MG PO TABS
650.0000 mg | ORAL_TABLET | Freq: Four times a day (QID) | ORAL | Status: DC | PRN
  Administered 2022-11-23 – 2022-11-28 (×14): 650 mg via ORAL
  Filled 2022-11-23 (×14): qty 2

## 2022-11-23 NOTE — H&P (View-Only) (Signed)
Daily Progress Note  DOA: 11/22/2022 Hospital Day: 2 Chief Complaint: hematochezia   Assessment and Plan:    Brief Narrative:  Luis Marquez is a 62 y.o. year old male whose past medical history includes but isn't necessarily limited to alcohol use disorder, hypertension, chronic systolic chronic systolic heart failure,  Afib on Eliquis, COPD, GERD with severe esophagitis. Patient was scheduled for an outpatient EGD ( to follow up on severe esophagitis from previous EGD) and also a colonoscopy (for possibly history of polyps). While prepping he began having painless rectal bleeding. We saw him in consultation 5/7. Refer to that note for details.   Painless hematochezia without hypotension. Rule out diverticular hemorrhage or colon AVM.  Ischemic colitis seems less likely. Volume of bleeding seems more than would be expected for hemorrhoidal bleeding.  Bleeding has resolved.  -Plan is for colonoscopy tomorrow. The risks/ benefits of the procedure were already explained yesterday and he has no remaining questions.   Acute blood loss anemia.  Presenting hgb 12.6 (baseline hgb 13-14).  Hgb 11.4 today.   GERD with history of severe esophagitis with bleeding in May 2023.  Repeat EGD to assess for healing was recommended but not done. -Will proceed with repeat EGD tomorrow. The risks and benefits of the procedure were already discussed with the patient yesterday. No remaining questions.  -Continue BID PPI    Atrial Fibrillation, last dose of Eliquis was 5/3  HTN, poorly controlled   Attending Physician Note   I have taken an interval history, reviewed the chart and examined the patient. I performed a substantive portion of this encounter, including complete performance of at least one of the key components, in conjunction with the APP. I agree with the APP's note, impression and recommendations with my edits. My additional impressions and recommendations are as follows.   Hematochezia  during bowel prep for colonoscopy. Suspected diverticular bleed. R/O other causes. Colonoscopy was planned Tuesday with VN for a possible personal history of colon polyps vs CRC screening. Trend CBC. If significant recurrent bleeding proceed with CTA and if positive consult IR. Colonoscopy/EGD tomorrow with Dr. Rhea Belton.   ABL anemia. Hgb has decreased to 11.4.   GERD with LA Grade D esophagitis with bleeding on May 2023 EGD. Follow up EGD was planned on Tuesday with VN.  Pantoprazole bid for now. EGD tomorrow.    Afib. Continue to hold Eliquis.  Claudette Head, MD [facility] See AMION, [facility], for our on call provider    Subjective / New Events:   No further hematochezia. Feels okay   Objective:   Recent Labs    11/22/22 1038 11/22/22 1328 11/22/22 1610 11/22/22 2142 11/23/22 0539  WBC 5.6 6.2  --   --  5.8  HGB 13.3 12.6* 12.7* 12.4* 11.4*  HCT 39.5 37.2* 38.2* 36.6* 33.7*  PLT 225 224  --   --  195   BMET Recent Labs    11/22/22 1038  NA 139  K 3.8  CL 102  CO2 28  GLUCOSE 112*  BUN 8  CREATININE 0.80  CALCIUM 8.6*   LFT Recent Labs    11/22/22 1038  PROT 6.4*  ALBUMIN 3.1*  AST 30  ALT 21  ALKPHOS 110  BILITOT 1.6*   PT/INR Recent Labs    11/22/22 1038  LABPROT 15.0  INR 1.2     Imaging:  DG Chest Portable 1 View CLINICAL DATA:  Vomiting  EXAM: PORTABLE CHEST 1 VIEW  COMPARISON:  12/07/2021  FINDINGS: Heart and mediastinal contours are within normal limits. No focal opacities or effusions. No acute bony abnormality.  IMPRESSION: No active disease.  Electronically Signed   By: Charlett Nose M.D.   On: 02/22/2022 00:01     Scheduled inpatient medications:   carvedilol  6.25 mg Oral BID WC   folic acid  1 mg Oral Daily   hydrALAZINE  25 mg Oral Q8H   mometasone-formoterol  2 puff Inhalation BID   multivitamin with minerals  1 tablet Oral Daily   pantoprazole  40 mg Oral BID   thiamine  100 mg Oral Daily   Or   thiamine  100 mg  Intravenous Daily   Continuous inpatient infusions:  PRN inpatient medications: albuterol, hydrALAZINE, LORazepam **OR** LORazepam  Vital signs in last 24 hours: Temp:  [97.9 F (36.6 C)-98.9 F (37.2 C)] 98.2 F (36.8 C) (05/08 0543) Pulse Rate:  [39-84] 80 (05/08 1006) Resp:  [17-22] 18 (05/08 1006) BP: (120-184)/(79-129) 152/88 (05/08 1006) SpO2:  [91 %-99 %] 96 % (05/08 1006) Last BM Date : 11/23/22  Intake/Output Summary (Last 24 hours) at 11/23/2022 1135 Last data filed at 11/22/2022 1418 Gross per 24 hour  Intake 450 ml  Output --  Net 450 ml    Intake/Output from previous day: 05/07 0701 - 05/08 0700 In: 450 [I.V.:450] Out: -  Intake/Output this shift: No intake/output data recorded.   Physical Exam:  General: Alert male in NAD Heart:  Regular rate .  Pulmonary: Normal respiratory effort Abdomen: Soft, nondistended, nontender. Normal bowel sounds. Extremities: No lower extremity edema  Neurologic: Alert and oriented Psych: Pleasant. Cooperative. Insight appears normal.     LOS: 1 day   Willette Cluster ,NP 11/23/2022, 11:35 AM

## 2022-11-23 NOTE — TOC Initial Note (Signed)
Transition of Care [facility]) - Initial/Assessment Note    Patient Details  Name: Luis Marquez MRN: [id number] Date of Birth: 14-Feb-1961  [facility]) CM/SW Contact:    Durenda Guthrie, RN Phone Number: 11/23/2022, 10:42 AM  Clinical Narrative:                  Transition of Care [facility]) Department has reviewed patient and no TOC needs have been identified at this time. We will continue to monitor patient advancement through Interdisciplinary progressions and if new patient needs arise, please place a consult.       Patient Goals and CMS Choice            Expected Discharge Plan and Services                                              Prior Living Arrangements/Services                       Activities of Daily Living Home Assistive Devices/Equipment: Cane (specify quad or straight) ("just incase my hip goes out") ADL Screening (condition at time of admission) Patient's cognitive ability adequate to safely complete daily activities?: Yes Is the patient deaf or have difficulty hearing?: No Does the patient have difficulty seeing, even when wearing glasses/contacts?: No Does the patient have difficulty concentrating, remembering, or making decisions?: No Patient able to express need for assistance with ADLs?: Yes Does the patient have difficulty dressing or bathing?: No Independently performs ADLs?: Yes (appropriate for developmental age) Does the patient have difficulty walking or climbing stairs?: Yes Weakness of Legs: Right (states he "has a bad hip") Weakness of Arms/Hands: None  Permission Sought/Granted                  Emotional Assessment              Admission diagnosis:  Rectal bleeding [K62.5] GI bleed [K92.2] Patient Active Problem List   Diagnosis Date Noted   Hematochezia 11/22/2022   Diverticulosis of intestine with bleeding 11/22/2022   GI bleed 11/22/2022   Colon cancer screening 09/20/2022   Esophagitis  09/20/2022   Gastroesophageal reflux disease with esophagitis 09/20/2022   Chronic anticoagulation 09/20/2022   Tobacco use 12/20/2021   Esophageal dysphagia    Acute esophagitis    Alcohol abuse 12/08/2021   Paroxysmal atrial fibrillation (HCC) 12/08/2021   Chronic systolic CHF (congestive heart failure) (HCC) 11/22/2021   Acute HFrEF (heart failure with reduced ejection fraction) (HCC) 11/04/2021   Hypokalemia 11/03/2021   Solitary pulmonary nodule 11/03/2021   Emphysema lung (HCC) 11/03/2021   Essential hypertension 11/03/2021   AKI (acute kidney injury) (HCC)    Hypercalcemia 11/02/2021   Anemia 11/11/2014   PCP:  Claiborne Rigg, NP Pharmacy:   [facility] [address] [place] [address] Phone: [phone] Fax: [phone]     Social Determinants of Health (SDOH) Social History: SDOH Screenings   Food Insecurity: No Food Insecurity (11/22/2022)  Housing: Medium Risk (11/22/2022)  Transportation Needs: No Transportation Needs (11/22/2022)  Utilities: Not At Risk (11/22/2022)  Depression (PHQ2-9): Medium Risk (11/15/2022)  Tobacco Use: High Risk (11/22/2022)   SDOH Interventions:     Readmission Risk Interventions     No data to display

## 2022-11-23 NOTE — TOC Progression Note (Signed)
Transition of Care [facility]) - Progression Note    Patient Details  Name: Luis Marquez MRN: [id number] Date of Birth: [birth year]  [facility]) CM/SW Contact  Otelia Santee, LCSW Phone Number: 11/23/2022, 12:50 PM  Clinical Narrative:    [facility] consulted for SA resources. Pt shares he drinks alcohol 2x a week and denies current impact on daily living. Pt reports being connected with [facility] for counseling and support. Pt declines further resources at this time.    Expected Discharge Plan: Home/Self Care Barriers to Discharge: No Barriers Identified  Expected Discharge Plan and Services In-house Referral: Clinical Social Work Discharge Planning Services: NA Post Acute Care Choice: NA Living arrangements for the past 2 months: Single Family Home                 DME Arranged: N/A DME Agency: NA                   Social Determinants of Health (SDOH) Interventions SDOH Screenings   Food Insecurity: No Food Insecurity (11/22/2022)  Housing: Medium Risk (11/22/2022)  Transportation Needs: No Transportation Needs (11/22/2022)  Utilities: Not At Risk (11/22/2022)  Depression (PHQ2-9): Medium Risk (11/15/2022)  Tobacco Use: High Risk (11/22/2022)    Readmission Risk Interventions    11/23/2022   12:48 PM  Readmission Risk Prevention Plan  Post Dischage Appt Complete  Medication Screening Complete  Transportation Screening Complete

## 2022-11-23 NOTE — Progress Notes (Signed)
[facility] Progress Note  Patient: Luis Marquez    ZOX:[id number]  DOA: 11/22/2022    Date of Service: the patient was seen and examined on 11/23/2022  Brief hospital course: ary Gapp is a 62 y.o. male with medical history significant of chronic HFrEF with recent LVEF 42% on stress test, nonischemic cardiomyopathy, PAF on Eliquis, COPD, alcohol abuse, diverticulosis, who presented to the emergency room on 5/7 with rectal bleed.   Patient has been doing GI prep since 5/6 morning for a incoming elective per 10 years screening colonoscopy today.  After multiple episodes of bowel movements after drinking the liquid preparation fluid, patient started to have rectal bleeding.  3 episode since morning of presentation and last dose of Eliquis for 5 days prior.  Hemoglobin at 12.6 with baseline of 13-14  Assessment and Plan: Hematochezia -Clinically suspect bleeding source is diverticulosis as patient's Hx and previous CT abd has showed.  Hemoglobin overall seems stable although slight drop from previous day. -Last time passed rectal bleeding was >6 hours ago and H/H appears to be be stabilized.  However given the fact that he is normally on Eliquis, still need to monitor closely.  Plan is for colonoscopy tomorrow.   HTN, poorly controlled -Change metoprolol to Coreg -Losartan dosage -As needed hydralazine  GERD with history of severe esophagitis with bleeding in May 2023 GI had recommended repeat EGD to assess for healing, but this was not done.  Plan is to repeat EGD on 5/9.  On twice daily PPI   History of PAF -Continue to hold off Eliquis -SCD for DVT prophylaxis   Chronic systolic heart failure Echocardiogram done April 2023 notes ejection fraction of 20 to 25% with indeterminate diastolic function.  Holding off IV fluids given stability, will get BNP in the morning   History of alcohol abuse -No symptoms or signs of active withdrawal, start CIWA protocol with as needed  benzos  Obesity Meets criteria BMI greater than 30    Body mass index is 30.34 kg/m.        Consultants: Gastroenterology  Procedures: Planned colonoscopy 5/9  Antimicrobials: None  Code Status: Full code   Subjective: Patient with no complaints  Objective: Mildly elevated blood pressure Vitals:   11/23/22 1006 11/23/22 1354  BP: (!) 152/88 (!) 164/88  Pulse: 80 80  Resp: 18 18  Temp:  97.8 F (36.6 C)  SpO2: 96% 98%    Intake/Output Summary (Last 24 hours) at 11/23/2022 1656 Last data filed at 11/23/2022 1400 Gross per 24 hour  Intake 240 ml  Output --  Net 240 ml   Filed Weights   11/22/22 1006  Weight: 104.3 kg   Body mass index is 30.34 kg/m.  Exam:  General: Alert and oriented x 3, no acute distress HEENT: Normocephalic, atraumatic, mucous membranes are moist Cardiovascular: Regular rate and rhythm, S1-S2 Respiratory: Clear to auscultation bilaterally Abdomen: Soft, mildly distended, nontender, normoactive bowel sounds Musculoskeletal: No clubbing or cyanosis or edema Skin: No skin breaks, tears or lesions Psychiatry: Appropriate, no evidence of psychoses Neurology: No focal deficits  Data Reviewed: Hemoglobin today 11.4 with normal MCV  Disposition:  Status is: Inpatient Remains inpatient appropriate because:  -Needs endoscopy and EGD    Anticipated discharge date: 5/9 or 5/10  Family Communication: Declined for me to call anyone DVT Prophylaxis: SCDs Start: 11/22/22 1405    Author: Hollice Espy ,MD 11/23/2022 4:56 PM  To reach On-call, see care teams to locate the attending and reach out  via [email or web address]. Between 7PM-7AM, please contact night-coverage If you still have difficulty reaching the attending provider, please page the [employer] (Director on Call) for [facility] on amion for assistance.

## 2022-11-23 NOTE — Progress Notes (Addendum)
   Daily Progress Note  DOA: 11/22/2022 Hospital Day: 2 Chief Complaint: hematochezia   Assessment and Plan:    Brief Narrative:  Luis Marquez is a 62 y.o. year old male whose past medical history includes but isn't necessarily limited to alcohol use disorder, hypertension, chronic systolic chronic systolic heart failure,  Afib on Eliquis, COPD, GERD with severe esophagitis. Patient was scheduled for an outpatient EGD ( to follow up on severe esophagitis from previous EGD) and also a colonoscopy (for possibly history of polyps). While prepping he began having painless rectal bleeding. We saw him in consultation 5/7. Refer to that note for details.   Painless hematochezia without hypotension. Rule out diverticular hemorrhage or colon AVM.  Ischemic colitis seems less likely. Volume of bleeding seems more than would be expected for hemorrhoidal bleeding.  Bleeding has resolved.  -Plan is for colonoscopy tomorrow. The risks/ benefits of the procedure were already explained yesterday and he has no remaining questions.   Acute blood loss anemia.  Presenting hgb 12.6 (baseline hgb 13-14).  Hgb 11.4 today.   GERD with history of severe esophagitis with bleeding in May 2023.  Repeat EGD to assess for healing was recommended but not done. -Will proceed with repeat EGD tomorrow. The risks and benefits of the procedure were already discussed with the patient yesterday. No remaining questions.  -Continue BID PPI    Atrial Fibrillation, last dose of Eliquis was 5/3  HTN, poorly controlled   Attending Physician Note   I have taken an interval history, reviewed the chart and examined the patient. I performed a substantive portion of this encounter, including complete performance of at least one of the key components, in conjunction with the APP. I agree with the APP's note, impression and recommendations with my edits. My additional impressions and recommendations are as follows.   Hematochezia  during bowel prep for colonoscopy. Suspected diverticular bleed. R/O other causes. Colonoscopy was planned Tuesday with VN for a possible personal history of colon polyps vs CRC screening. Trend CBC. If significant recurrent bleeding proceed with CTA and if positive consult IR. Colonoscopy/EGD tomorrow with Dr. Pyrtle.   ABL anemia. Hgb has decreased to 11.4.   GERD with LA Grade D esophagitis with bleeding on May 2023 EGD. Follow up EGD was planned on Tuesday with VN.  Pantoprazole bid for now. EGD tomorrow.    Afib. Continue to hold Eliquis.  Luis Ferrin, MD FACG See AMION, [facility], for our on call provider    Subjective / New Events:   No further hematochezia. Feels okay   Objective:   Recent Labs    11/22/22 1038 11/22/22 1328 11/22/22 1610 11/22/22 2142 11/23/22 0539  WBC 5.6 6.2  --   --  5.8  HGB 13.3 12.6* 12.7* 12.4* 11.4*  HCT 39.5 37.2* 38.2* 36.6* 33.7*  PLT 225 224  --   --  195   BMET Recent Labs    11/22/22 1038  NA 139  K 3.8  CL 102  CO2 28  GLUCOSE 112*  BUN 8  CREATININE 0.80  CALCIUM 8.6*   LFT Recent Labs    11/22/22 1038  PROT 6.4*  ALBUMIN 3.1*  AST 30  ALT 21  ALKPHOS 110  BILITOT 1.6*   PT/INR Recent Labs    11/22/22 1038  LABPROT 15.0  INR 1.2     Imaging:  DG Chest Portable 1 View CLINICAL DATA:  Vomiting  EXAM: PORTABLE CHEST 1 VIEW  COMPARISON:  12/07/2021    FINDINGS: Heart and mediastinal contours are within normal limits. No focal opacities or effusions. No acute bony abnormality.  IMPRESSION: No active disease.  Electronically Signed   By: Kevin  Dover M.D.   On: 02/22/2022 00:01     Scheduled inpatient medications:   carvedilol  6.25 mg Oral BID WC   folic acid  1 mg Oral Daily   hydrALAZINE  25 mg Oral Q8H   mometasone-formoterol  2 puff Inhalation BID   multivitamin with minerals  1 tablet Oral Daily   pantoprazole  40 mg Oral BID   thiamine  100 mg Oral Daily   Or   thiamine  100 mg  Intravenous Daily   Continuous inpatient infusions:  PRN inpatient medications: albuterol, hydrALAZINE, LORazepam **OR** LORazepam  Vital signs in last 24 hours: Temp:  [97.9 F (36.6 C)-98.9 F (37.2 C)] 98.2 F (36.8 C) (05/08 0543) Pulse Rate:  [39-84] 80 (05/08 1006) Resp:  [17-22] 18 (05/08 1006) BP: (120-184)/(79-129) 152/88 (05/08 1006) SpO2:  [91 %-99 %] 96 % (05/08 1006) Last BM Date : 11/23/22  Intake/Output Summary (Last 24 hours) at 11/23/2022 1135 Last data filed at 11/22/2022 1418 Gross per 24 hour  Intake 450 ml  Output --  Net 450 ml    Intake/Output from previous day: 05/07 0701 - 05/08 0700 In: 450 [I.V.:450] Out: -  Intake/Output this shift: No intake/output data recorded.   Physical Exam:  General: Alert male in NAD Heart:  Regular rate .  Pulmonary: Normal respiratory effort Abdomen: Soft, nondistended, nontender. Normal bowel sounds. Extremities: No lower extremity edema  Neurologic: Alert and oriented Psych: Pleasant. Cooperative. Insight appears normal.     LOS: 1 day   Paula Guenther ,NP 11/23/2022, 11:35 AM       

## 2022-11-24 ENCOUNTER — Inpatient Hospital Stay (HOSPITAL_COMMUNITY): Payer: 59 | Admitting: Anesthesiology

## 2022-11-24 ENCOUNTER — Encounter (HOSPITAL_COMMUNITY): Payer: Self-pay | Admitting: Internal Medicine

## 2022-11-24 ENCOUNTER — Encounter (HOSPITAL_COMMUNITY)
Admission: EM | Disposition: A | Discharge status: Home / Self Care | Payer: Self-pay | Source: Home / Self Care | Attending: Internal Medicine

## 2022-11-24 DIAGNOSIS — K449 Diaphragmatic hernia without obstruction or gangrene: Secondary | ICD-10-CM | POA: Diagnosis not present

## 2022-11-24 DIAGNOSIS — D122 Benign neoplasm of ascending colon: Secondary | ICD-10-CM

## 2022-11-24 DIAGNOSIS — F17210 Nicotine dependence, cigarettes, uncomplicated: Secondary | ICD-10-CM

## 2022-11-24 DIAGNOSIS — K5790 Diverticulosis of intestine, part unspecified, without perforation or abscess without bleeding: Secondary | ICD-10-CM | POA: Diagnosis not present

## 2022-11-24 DIAGNOSIS — D128 Benign neoplasm of rectum: Secondary | ICD-10-CM | POA: Diagnosis not present

## 2022-11-24 DIAGNOSIS — K2980 Duodenitis without bleeding: Secondary | ICD-10-CM | POA: Diagnosis not present

## 2022-11-24 DIAGNOSIS — J449 Chronic obstructive pulmonary disease, unspecified: Secondary | ICD-10-CM

## 2022-11-24 DIAGNOSIS — I509 Heart failure, unspecified: Secondary | ICD-10-CM | POA: Diagnosis not present

## 2022-11-24 DIAGNOSIS — D124 Benign neoplasm of descending colon: Secondary | ICD-10-CM | POA: Diagnosis not present

## 2022-11-24 DIAGNOSIS — I110 Hypertensive heart disease with heart failure: Secondary | ICD-10-CM | POA: Diagnosis not present

## 2022-11-24 DIAGNOSIS — I5023 Acute on chronic systolic (congestive) heart failure: Secondary | ICD-10-CM | POA: Diagnosis not present

## 2022-11-24 DIAGNOSIS — K2100 Gastro-esophageal reflux disease with esophagitis, without bleeding: Secondary | ICD-10-CM | POA: Diagnosis not present

## 2022-11-24 DIAGNOSIS — D125 Benign neoplasm of sigmoid colon: Secondary | ICD-10-CM

## 2022-11-24 DIAGNOSIS — I10 Essential (primary) hypertension: Secondary | ICD-10-CM

## 2022-11-24 DIAGNOSIS — K219 Gastro-esophageal reflux disease without esophagitis: Secondary | ICD-10-CM

## 2022-11-24 DIAGNOSIS — K2289 Other specified disease of esophagus: Secondary | ICD-10-CM

## 2022-11-24 DIAGNOSIS — K648 Other hemorrhoids: Secondary | ICD-10-CM

## 2022-11-24 DIAGNOSIS — K5730 Diverticulosis of large intestine without perforation or abscess without bleeding: Secondary | ICD-10-CM

## 2022-11-24 DIAGNOSIS — E669 Obesity, unspecified: Secondary | ICD-10-CM | POA: Diagnosis not present

## 2022-11-24 DIAGNOSIS — K229 Disease of esophagus, unspecified: Secondary | ICD-10-CM

## 2022-11-24 HISTORY — PX: BIOPSY: SHX5522

## 2022-11-24 HISTORY — PX: POLYPECTOMY: SHX5525

## 2022-11-24 HISTORY — PX: ESOPHAGOGASTRODUODENOSCOPY (EGD) WITH PROPOFOL: SHX5813

## 2022-11-24 HISTORY — PX: COLONOSCOPY WITH PROPOFOL: SHX5780

## 2022-11-24 LAB — CBC
HCT: 35.5 % — ABNORMAL LOW (ref 39.0–52.0)
Hemoglobin: 11.8 g/dL — ABNORMAL LOW (ref 13.0–17.0)
MCH: 33 pg (ref 26.0–34.0)
MCHC: 33.2 g/dL (ref 30.0–36.0)
MCV: 99.2 fL (ref 80.0–100.0)
Platelets: 194 10*3/uL (ref 150–400)
RBC: 3.58 MIL/uL — ABNORMAL LOW (ref 4.22–5.81)
RDW: 13.2 % (ref 11.5–15.5)
WBC: 5.7 10*3/uL (ref 4.0–10.5)
nRBC: 0 % (ref 0.0–0.2)

## 2022-11-24 LAB — BRAIN NATRIURETIC PEPTIDE: B Natriuretic Peptide: 290 pg/mL — ABNORMAL HIGH (ref 0.0–100.0)

## 2022-11-24 SURGERY — ESOPHAGOGASTRODUODENOSCOPY (EGD) WITH PROPOFOL
Anesthesia: Monitor Anesthesia Care

## 2022-11-24 MED ORDER — LACTATED RINGERS IV SOLN
INTRAVENOUS | Status: AC | PRN
  Administered 2022-11-24: 1000 mL via INTRAVENOUS

## 2022-11-24 MED ORDER — OXYCODONE HCL 5 MG PO TABS
5.0000 mg | ORAL_TABLET | Freq: Four times a day (QID) | ORAL | Status: DC | PRN
  Administered 2022-11-24 – 2022-11-28 (×15): 5 mg via ORAL
  Filled 2022-11-24 (×15): qty 1

## 2022-11-24 MED ORDER — PROPOFOL 10 MG/ML IV BOLUS
INTRAVENOUS | Status: DC | PRN
  Administered 2022-11-24: 40 mg via INTRAVENOUS
  Administered 2022-11-24: 20 mg via INTRAVENOUS

## 2022-11-24 MED ORDER — FUROSEMIDE 10 MG/ML IJ SOLN
20.0000 mg | Freq: Every day | INTRAMUSCULAR | Status: DC
  Administered 2022-11-24 – 2022-11-25 (×2): 20 mg via INTRAVENOUS
  Filled 2022-11-24 (×2): qty 2

## 2022-11-24 MED ORDER — PROPOFOL 500 MG/50ML IV EMUL
INTRAVENOUS | Status: DC | PRN
  Administered 2022-11-24: 125 ug/kg/min via INTRAVENOUS

## 2022-11-24 MED ORDER — PHENYLEPHRINE HCL-NACL 20-0.9 MG/250ML-% IV SOLN
INTRAVENOUS | Status: AC
  Filled 2022-11-24: qty 250

## 2022-11-24 SURGICAL SUPPLY — 25 items

## 2022-11-24 NOTE — Anesthesia Preprocedure Evaluation (Addendum)
Anesthesia Evaluation  Patient identified by MRN, date of birth, ID band Patient awake    Reviewed: Allergy & Precautions, NPO status , Patient's Chart, lab work & pertinent test results  Airway Mallampati: II  TM Distance: >3 FB Neck ROM: Full    Dental  (+) Edentulous Upper, Edentulous Lower   Pulmonary COPD,  COPD inhaler, Current Smoker and Patient abstained from smoking.   Pulmonary exam normal        Cardiovascular hypertension, Pt. on medications and Pt. on home beta blockers +CHF  Normal cardiovascular exam+ dysrhythmias Atrial Fibrillation   ECHO: EF 20-30 %   Neuro/Psych  PSYCHIATRIC DISORDERS      negative neurological ROS     GI/Hepatic negative GI ROS, Neg liver ROS,,,  Endo/Other  negative endocrine ROS    Renal/GU negative Renal ROS     Musculoskeletal negative musculoskeletal ROS (+)    Abdominal   Peds  Hematology  (+) Blood dyscrasia, anemia   Anesthesia Other Findings Anemia GI bleed  Reproductive/Obstetrics                              Anesthesia Physical Anesthesia Plan  ASA: 4  Anesthesia Plan: MAC   Post-op Pain Management:    Induction: Intravenous  PONV Risk Score and Plan: 0 and Propofol infusion and Treatment may vary due to age or medical condition  Airway Management Planned: Nasal Cannula  Additional Equipment:   Intra-op Plan:   Post-operative Plan:   Informed Consent: I have reviewed the patients History and Physical, chart, labs and discussed the procedure including the risks, benefits and alternatives for the proposed anesthesia with the patient or authorized representative who has indicated his/her understanding and acceptance.     Dental advisory given  Plan Discussed with: CRNA  Anesthesia Plan Comments:         Anesthesia Quick Evaluation

## 2022-11-24 NOTE — Op Note (Signed)
[facility] Patient Name: Luis Marquez Procedure Date: 11/24/2022 MRN: [id number] Attending MD: Beverley Fiedler , MD, [id number] Date of Birth: 02/19/1961 CSN: [id number] Age: 62 Admit Type: Inpatient Procedure:                Upper GI endoscopy Indications:              Follow-up of reflux esophagitis seen in May 2023,                            recent hematochezia Providers:                Carie Caddy. Rhea Belton, MD, Stephens Shire RN, RN, Kandice Robinsons, Technician Referring MD:             [facility] Medicines:                Monitored Anesthesia Care Complications:            No immediate complications. Estimated Blood Loss:     Estimated blood loss was minimal. Procedure:                Pre-Anesthesia Assessment:                           - Prior to the procedure, a History and Physical                            was performed, and patient medications and                            allergies were reviewed. The patient's tolerance of                            previous anesthesia was also reviewed. The risks                            and benefits of the procedure and the sedation                            options and risks were discussed with the patient.                            All questions were answered, and informed consent                            was obtained. Prior Anticoagulants: The patient has                            taken Eliquis (apixaban), last dose was 5 days                            prior to procedure. ASA Grade Assessment: III - A  patient with severe systemic disease. After                            reviewing the risks and benefits, the patient was                            deemed in satisfactory condition to undergo the                            procedure.                           After obtaining informed consent, the endoscope was                            passed  under direct vision. Throughout the                            procedure, the patient's blood pressure, pulse, and                            oxygen saturations were monitored continuously. The                            [id number]-H190 ([id number]) Olympus endoscope was introduced                            through the mouth, and advanced to the second part                            of duodenum. The upper GI endoscopy was                            accomplished without difficulty. The patient                            tolerated the procedure well. Scope In: Scope Out: Findings:      In the proximal esophagus there was an inlet patch without nodularity or       visible abnormality.      The Z-line was irregular and was found 39 cm from the incisors. Biopsies       were taken with a cold forceps for histology.      A 3 cm hiatal hernia was present.      The entire examined stomach was normal.      The examined duodenum was normal. Impression:               - Z-line irregular, 39 cm from the incisors.                            Biopsied to exclude Barrett's (if present, short                            segment).                           -  Previously seen esophagitis has healed with PPI.                           - 3 cm hiatal hernia.                           - Normal stomach.                           - Normal examined duodenum. Moderate Sedation:      N/A Recommendation:           - Return patient to hospital ward for ongoing care.                           - Advance diet as tolerated.                           - Continue present medications. Continue daily PPI.                           - Await pathology results.                           - See the other procedure note for documentation of                            additional recommendations. Procedure Code(s):        --- Professional ---                           [phone], Esophagogastroduodenoscopy, flexible,                             transoral; with biopsy, single or multiple Diagnosis Code(s):        --- Professional ---                           K22.89, Other specified disease of esophagus                           K44.9, Diaphragmatic hernia without obstruction or                            gangrene                           K21.00, Gastro-esophageal reflux disease with                            esophagitis, without bleeding CPT copyright 2022 American Medical Association. All rights reserved. The codes documented in this report are preliminary and upon coder review may  be revised to meet current compliance requirements. Beverley Fiedler, MD 11/24/2022 1:38:36 PM This report has been signed electronically. Number of Addenda: 0

## 2022-11-24 NOTE — Transfer of Care (Signed)
Immediate Anesthesia Transfer of Care Note  Patient: Luis Marquez  Procedure(s) Performed: ESOPHAGOGASTRODUODENOSCOPY (EGD) WITH PROPOFOL COLONOSCOPY WITH PROPOFOL BIOPSY POLYPECTOMY  Patient Location: PACU and Endoscopy Unit  Anesthesia Type:MAC  Level of Consciousness: awake, alert , oriented, and patient cooperative  Airway & Oxygen Therapy: Patient Spontanous Breathing and Patient connected to face mask oxygen  Post-op Assessment: Report given to RN and Post -op Vital signs reviewed and stable  Post vital signs: Reviewed and stable  Last Vitals:  Vitals Value Taken Time  BP 112/67 11/24/22 1412  Temp    Pulse 77 11/24/22 1415  Resp 24 11/24/22 1415  SpO2 100 % 11/24/22 1415  Vitals shown include unvalidated device data.  Last Pain:  Vitals:   11/24/22 1211  TempSrc: Tympanic  PainSc: 0-No pain      Patients Stated Pain Goal: 0 (11/23/22 0830)  Complications: No notable events documented.

## 2022-11-24 NOTE — Progress Notes (Signed)
[facility] Progress Note  Patient: Luis Marquez    YNW:[id number]  DOA: 11/22/2022    Date of Service: the patient was seen and examined on 11/24/2022  Brief hospital course: Luis Marquez is a 62 y.o. male with medical history significant of chronic HFrEF with recent LVEF 42% on stress test, nonischemic cardiomyopathy, PAF on Eliquis, COPD, alcohol abuse, diverticulosis, who presented to the emergency room on 5/7 with rectal bleed.   Patient has been doing GI prep since 5/6 morning for a incoming elective per 10 years screening colonoscopy today.  After multiple episodes of bowel movements after drinking the liquid preparation fluid, patient started to have rectal bleeding.  3 episode since morning of presentation and last dose of Eliquis for 5 days prior.  Hemoglobin at 12.6 with baseline of 13-14  Assessment and Plan: Hematochezia -Clinically suspect bleeding source is diverticulosis as patient's Hx and previous CT abd has showed.  Hemoglobin overall seems stable although slight drop from previous day. -Last time passed rectal bleeding was >6 hours ago and H/H appears to be be stabilized.  However given the fact that he is normally on Eliquis, still need to monitor closely.  Plan is for colonoscopy this afternoon   HTN, poorly controlled -Change metoprolol to Coreg, and adjusted losartan dosage.  Blood pressures have since improved  GERD with history of severe esophagitis with bleeding in May 2023 GI had recommended repeat EGD to assess for healing, but this was not done.  Plan is to repeat EGD also today.  On twice daily PPI   History of PAF -Continue to hold off Eliquis -SCD for DVT prophylaxis   Acute on chronic systolic heart failure Echocardiogram done April 2023 notes ejection fraction of 20 to 25% with indeterminate diastolic function.  Holding off IV fluids given stability, BNP mildly elevated at 290.  Will give Lasix 20 mg IV daily   History of alcohol abuse -No symptoms or  signs of active withdrawal, start CIWA protocol with as needed benzos  Obesity Meets criteria BMI greater than 30    Body mass index is 30.34 kg/m.        Consultants: Gastroenterology  Procedures: Planned colonoscopy and EGD 5/9  Antimicrobials: None  Code Status: Full code   Subjective: Patient with no complaints  Objective: Blood pressure improved Vitals:   11/24/22 0459 11/24/22 0902  BP: 133/86   Pulse: 90   Resp: 18   Temp: 98.2 F (36.8 C)   SpO2: 95% 97%    Intake/Output Summary (Last 24 hours) at 11/24/2022 1036 Last data filed at 11/23/2022 1700 Gross per 24 hour  Intake 480 ml  Output --  Net 480 ml    Filed Weights   11/22/22 1006  Weight: 104.3 kg   Body mass index is 30.34 kg/m.  Exam:  General: Alert and oriented x 3, no acute distress HEENT: Normocephalic, atraumatic, mucous membranes are moist Cardiovascular: Regular rate and rhythm, S1-S2 Respiratory: Clear to auscultation bilaterally Abdomen: Soft, mildly distended, nontender, normoactive bowel sounds Musculoskeletal: No clubbing or cyanosis or edema Skin: No skin breaks, tears or lesions Psychiatry: Appropriate, no evidence of psychoses Neurology: No focal deficits  Data Reviewed: Hemoglobin 11.8.  BNP 290  Disposition:  Status is: Inpatient Remains inpatient appropriate because:  -Needs endoscopy and EGD -Treating mild heart failure    Anticipated discharge date: 5/10  Family Communication: Declined for me to call anyone DVT Prophylaxis: SCDs Start: 11/22/22 1405    Author: Hollice Espy ,MD 11/24/2022  10:36 AM  To reach On-call, see care teams to locate the attending and reach out via [email or web address]. Between 7PM-7AM, please contact night-coverage If you still have difficulty reaching the attending provider, please page the [facility] (Director on Call) for [facility] on amion for assistance.

## 2022-11-24 NOTE — Interval H&P Note (Signed)
History and Physical Interval Note: For EGD/colon today to followup esophagitis and for hematochezia The nature of the procedure, as well as the risks, benefits, and alternatives were carefully and thoroughly reviewed with the patient. Ample time for discussion and questions allowed. The patient understood, was satisfied, and agreed to proceed.      Latest Ref Rng & Units 11/24/2022    5:48 AM 11/23/2022    5:39 AM 11/22/2022    9:42 PM  CBC  WBC 4.0 - 10.5 K/uL 5.7  5.8    Hemoglobin 13.0 - 17.0 g/dL 13.0  86.5  78.4   Hematocrit 39.0 - 52.0 % 35.5  33.7  36.6   Platelets 150 - 400 K/uL 194  195          11/24/2022 1:06 PM  Vikki Ports  has presented today for surgery, with the diagnosis of Anemia, GI bleed.  The various methods of treatment have been discussed with the patient and family. After consideration of risks, benefits and other options for treatment, the patient has consented to  Procedure(s): ESOPHAGOGASTRODUODENOSCOPY (EGD) WITH PROPOFOL (N/A) COLONOSCOPY WITH PROPOFOL (N/A) as a surgical intervention.  The patient's history has been reviewed, patient examined, no change in status, stable for surgery.  I have reviewed the patient's chart and labs.  Questions were answered to the patient's satisfaction.     Carie Caddy Alise Calais

## 2022-11-24 NOTE — Op Note (Signed)
[facility] Patient Name: Luis Marquez Procedure Date: 11/24/2022 MRN: [id number] Attending MD: Beverley Fiedler , MD, [id number] Date of Birth: [birth year] CSN: [id number] Age: 62 Admit Type: Inpatient Procedure:                Colonoscopy Indications:              Hematochezia, Acute post hemorrhagic anemia Providers:                Carie Caddy. Rhea Belton, MD, Stephens Shire RN, RN, Kandice Robinsons, Technician Referring MD:             [facility] Medicines:                Monitored Anesthesia Care Complications:            No immediate complications. Estimated Blood Loss:     Estimated blood loss was minimal. Procedure:                Pre-Anesthesia Assessment:                           - Prior to the procedure, a History and Physical                            was performed, and patient medications and                            allergies were reviewed. The patient's tolerance of                            previous anesthesia was also reviewed. The risks                            and benefits of the procedure and the sedation                            options and risks were discussed with the patient.                            All questions were answered, and informed consent                            was obtained. Prior Anticoagulants: The patient has                            taken Eliquis (apixaban), last dose was 5 days                            prior to procedure. ASA Grade Assessment: III - A                            patient with severe systemic disease. After  reviewing the risks and benefits, the patient was                            deemed in satisfactory condition to undergo the                            procedure.                           After obtaining informed consent, the colonoscope                            was passed under direct vision. Throughout the                             procedure, the patient's blood pressure, pulse, and                            oxygen saturations were monitored continuously. The                            CF-HQ190L ([id number]) Olympus colonoscope was                            introduced through the anus and advanced to the the                            cecum, identified by appendiceal orifice and                            ileocecal valve. The colonoscopy was performed                            without difficulty. The patient tolerated the                            procedure well. The quality of the bowel                            preparation was good. The ileocecal valve,                            appendiceal orifice, and rectum were photographed. Scope In: 1:40:05 PM Scope Out: 2:03:52 PM Scope Withdrawal Time: 0 hours 19 minutes 14 seconds  Total Procedure Duration: 0 hours 23 minutes 47 seconds  Findings:      The digital rectal exam was normal.      A 5 mm polyp was found in the ascending colon. The polyp was sessile.       The polyp was removed with a cold snare. Resection and retrieval were       complete.      Four sessile polyps were found in the descending colon. The polyps were       4 to 8 mm in size. These polyps were removed with a cold snare.       Resection and  retrieval were complete.      Three sessile polyps were found in the sigmoid colon. The polyps were 3       to 6 mm in size. These polyps were removed with a cold snare. Resection       and retrieval were complete.      A 5 mm polyp was found in the rectum. The polyp was sessile. The polyp       was removed with a cold snare. Resection and retrieval were complete.      Multiple large-mouthed, medium-mouthed and small-mouthed diverticula       were found in the sigmoid colon and descending colon.      Internal hemorrhoids were found during retroflexion. The hemorrhoids       were small. Impression:               - One 5 mm polyp in the ascending colon,  removed                            with a cold snare. Resected and retrieved.                           - Four 4 to 8 mm polyps in the descending colon,                            removed with a cold snare. Resected and retrieved.                           - Three 3 to 6 mm polyps in the sigmoid colon,                            removed with a cold snare. Resected and retrieved.                           - One 5 mm polyp in the rectum, removed with a cold                            snare. Resected and retrieved.                           - Moderate to severe diverticulosis in the sigmoid                            colon and in the descending colon. Likley source of                            now resolved LGI bleed.                           - Small internal hemorrhoids. Moderate Sedation:      N/A Recommendation:           - Return patient to hospital ward for ongoing care.                           - Advance diet as tolerated.                           -  Continue present medications.                           - Resume Eliquis (apixaban) at prior dose tomorrow.                            Refer to managing physician for further adjustment                            of therapy.                           - Await pathology results.                           - Repeat colonoscopy is recommended for                            surveillance. The colonoscopy date will be                            determined after pathology results from today's                            exam become available for review.                           - Inpatient GI team signing off, call if questions. Procedure Code(s):        --- Professional ---                           [phone], Colonoscopy, flexible; with removal of                            tumor(s), polyp(s), or other lesion(s) by snare                            technique Diagnosis Code(s):        --- Professional ---                           D12.2, Benign  neoplasm of ascending colon                           D12.8, Benign neoplasm of rectum                           D12.4, Benign neoplasm of descending colon                           D12.5, Benign neoplasm of sigmoid colon                           K64.8, Other hemorrhoids                           K92.1, Melena (includes Hematochezia)  D62, Acute posthemorrhagic anemia                           K57.30, Diverticulosis of large intestine without                            perforation or abscess without bleeding CPT copyright 2022 [employer]. All rights reserved. The codes documented in this report are preliminary and upon coder review may  be revised to meet current compliance requirements. Beverley Fiedler, MD 11/24/2022 2:14:01 PM This report has been signed electronically. Number of Addenda: 0

## 2022-11-25 ENCOUNTER — Inpatient Hospital Stay (HOSPITAL_COMMUNITY): Payer: 59

## 2022-11-25 ENCOUNTER — Other Ambulatory Visit: Payer: Self-pay

## 2022-11-25 DIAGNOSIS — I5023 Acute on chronic systolic (congestive) heart failure: Secondary | ICD-10-CM | POA: Diagnosis not present

## 2022-11-25 DIAGNOSIS — K219 Gastro-esophageal reflux disease without esophagitis: Secondary | ICD-10-CM | POA: Diagnosis not present

## 2022-11-25 DIAGNOSIS — K567 Ileus, unspecified: Secondary | ICD-10-CM | POA: Diagnosis not present

## 2022-11-25 LAB — HEPATIC FUNCTION PANEL
ALT: 15 U/L (ref 0–44)
AST: 26 U/L (ref 15–41)
Albumin: 2.7 g/dL — ABNORMAL LOW (ref 3.5–5.0)
Alkaline Phosphatase: 76 U/L (ref 38–126)
Bilirubin, Direct: 0.3 mg/dL — ABNORMAL HIGH (ref 0.0–0.2)
Indirect Bilirubin: 0.6 mg/dL (ref 0.3–0.9)
Total Bilirubin: 0.9 mg/dL (ref 0.3–1.2)
Total Protein: 5.6 g/dL — ABNORMAL LOW (ref 6.5–8.1)

## 2022-11-25 LAB — BASIC METABOLIC PANEL
Anion gap: 8 (ref 5–15)
BUN: 8 mg/dL (ref 8–23)
CO2: 23 mmol/L (ref 22–32)
Calcium: 7.7 mg/dL — ABNORMAL LOW (ref 8.9–10.3)
Chloride: 104 mmol/L (ref 98–111)
Creatinine, Ser: 0.75 mg/dL (ref 0.61–1.24)
GFR, Estimated: >60 mL/min (ref >60)
Glucose, Bld: 106 mg/dL — ABNORMAL HIGH (ref 70–99)
Potassium: 3.1 mmol/L — ABNORMAL LOW (ref 3.5–5.1)
Sodium: 135 mmol/L (ref 135–145)

## 2022-11-25 LAB — SURGICAL PATHOLOGY

## 2022-11-25 LAB — MAGNESIUM: Magnesium: 1.4 mg/dL — ABNORMAL LOW (ref 1.7–2.4)

## 2022-11-25 IMAGING — CT CT ABD-PELV W/ CM
2 of 5 series · 16 of 46 positions shown, 18 images · IV contrast (APPLIED)
Comparison: CT of the chest from November 02, 2021.

A CLINICAL DATA:
61-year-old male presents for evaluation of potential metastatic
disease.

EXAM:
CT ABDOMEN AND PELVIS WITH CONTRAST
TECHNIQUE: Multidetector CT imaging of the abdomen and pelvis was performed
using the standard protocol following bolus administration of
intravenous contrast.

[Series 3: abd/ pelvis 5.0 i30f 2 · axial · 0.75mm/px · z∈[+1035,+1435]mm · 13 of 90 slices shown, 15 images]
[im 5/90  soft-tissue]
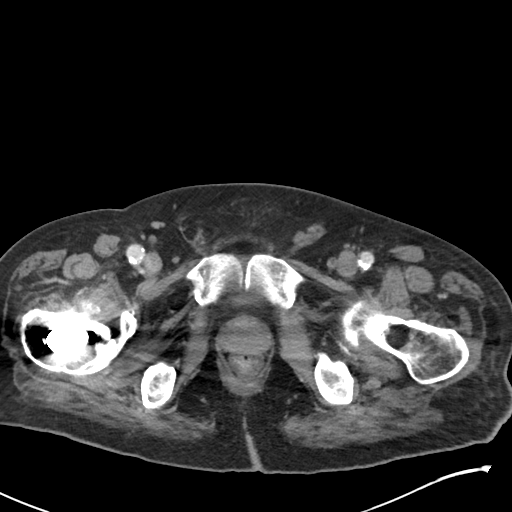
[im 5/90  bone]
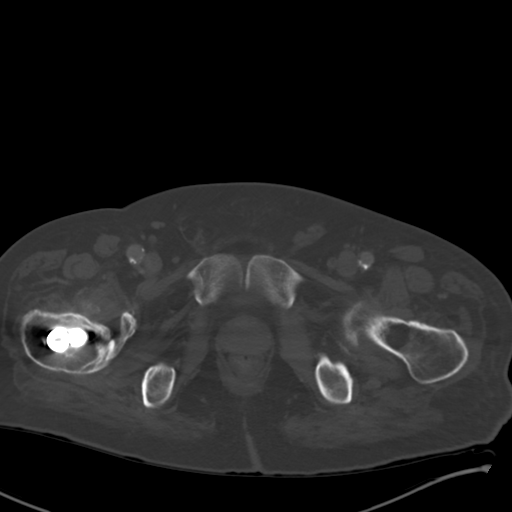
[im 15/90  soft-tissue]
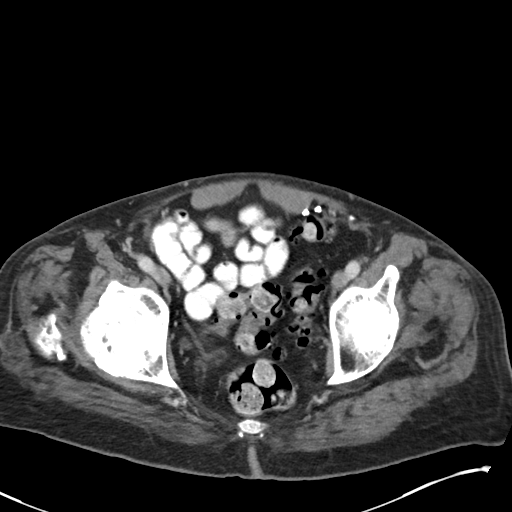
[im 19/90  soft-tissue]
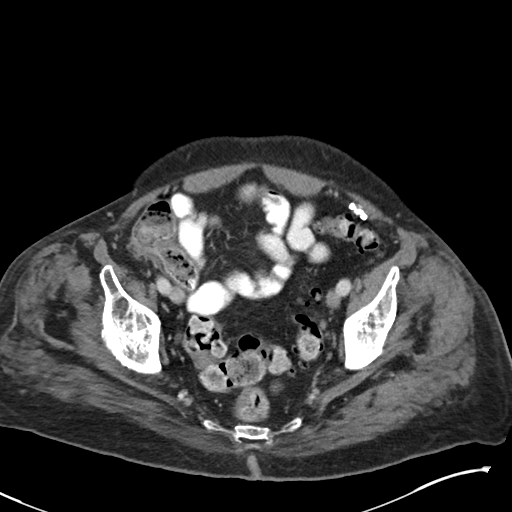
[im 24/90  soft-tissue]
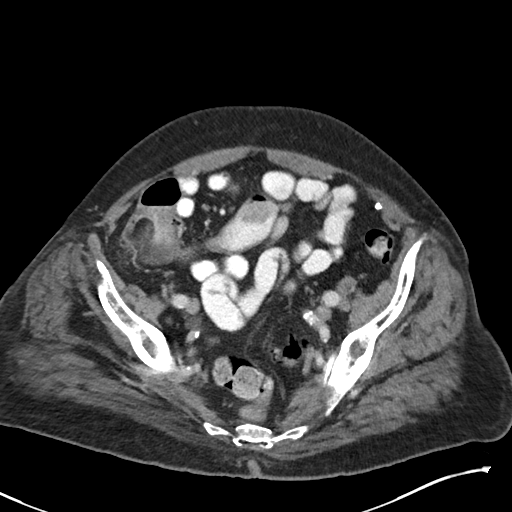
[im 33/90  soft-tissue]
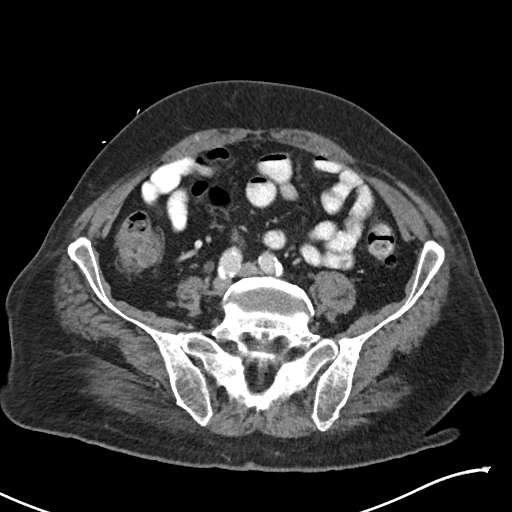
[im 38/90  soft-tissue]
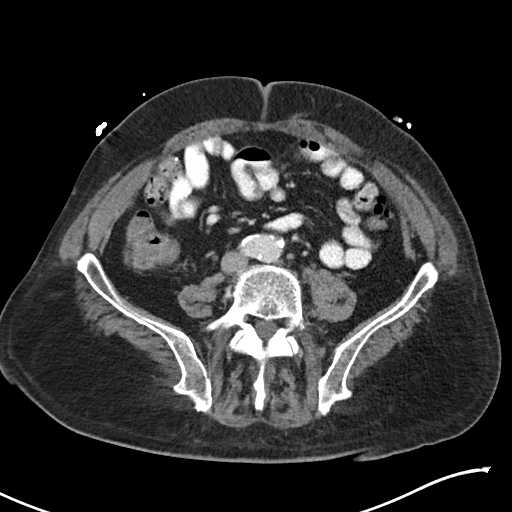
[im 47/90  soft-tissue]
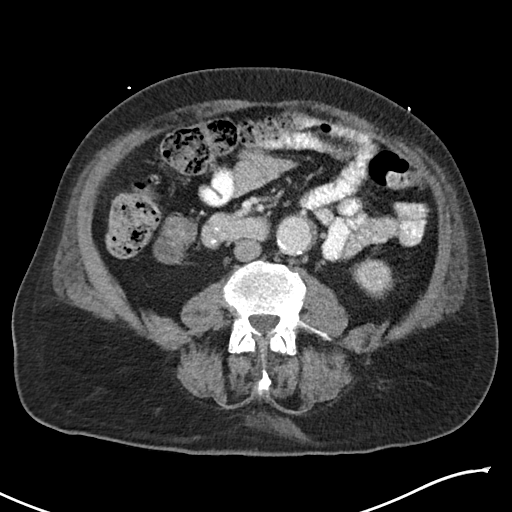
[im 52/90  soft-tissue]
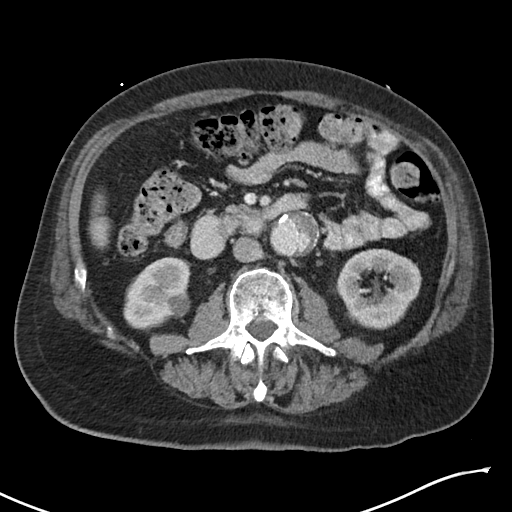
[im 57/90  soft-tissue]
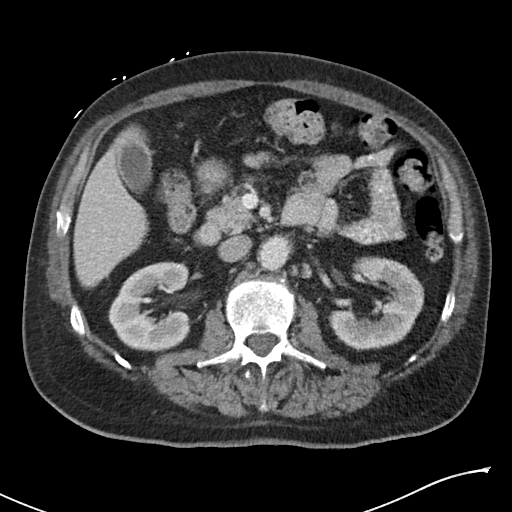
[im 57/90  bone]
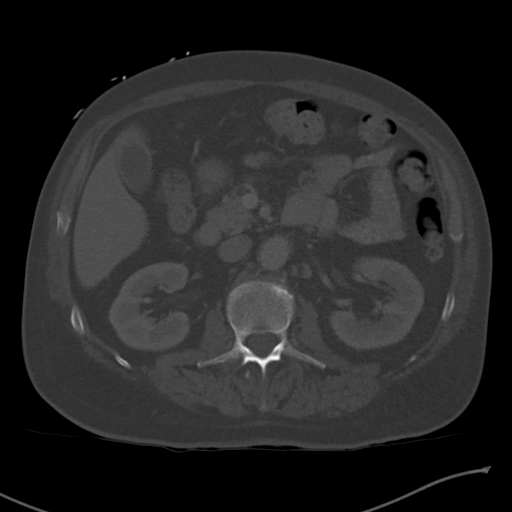
[im 66/90  soft-tissue]
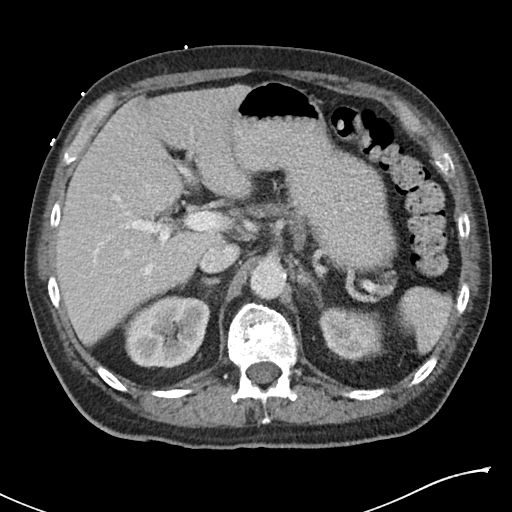
[im 71/90  soft-tissue]
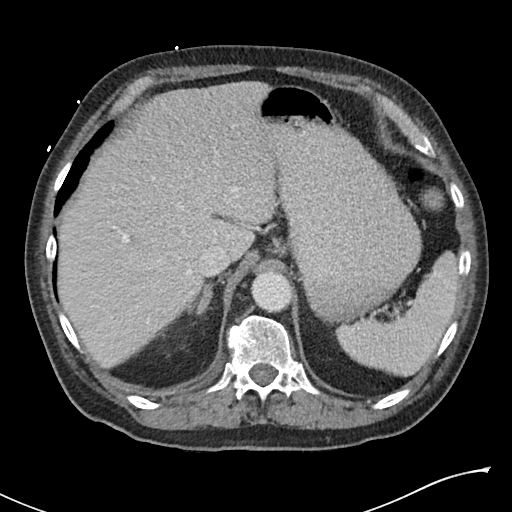
[im 75/90  soft-tissue]
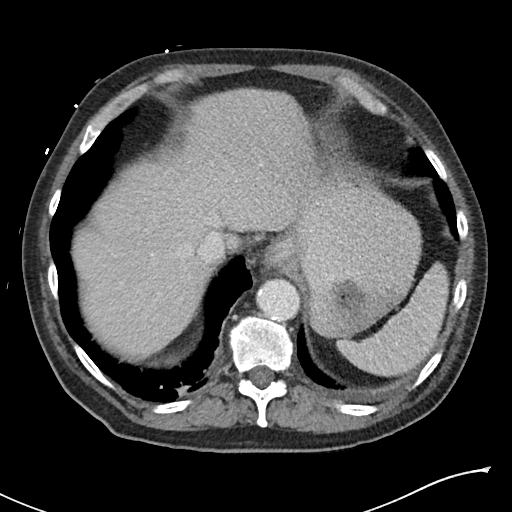
[im 85/90  soft-tissue]
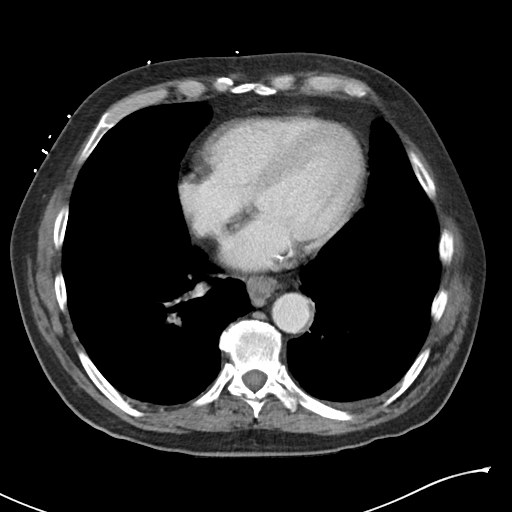

[Series 6: coronal soft tissue · coronal · 0.88mm/px · 3 of 101 slices shown]
[im 34/101  soft-tissue]
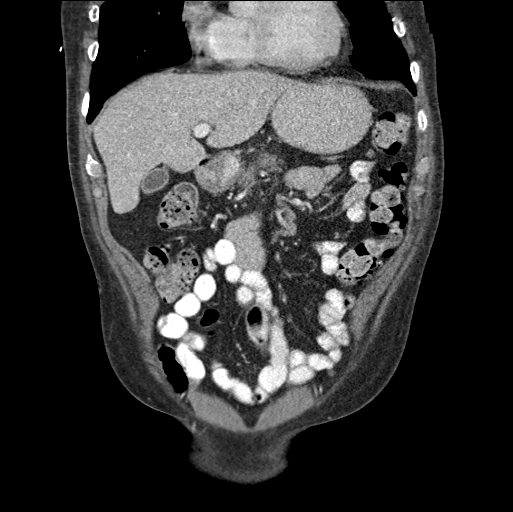
[im 45/101  soft-tissue]
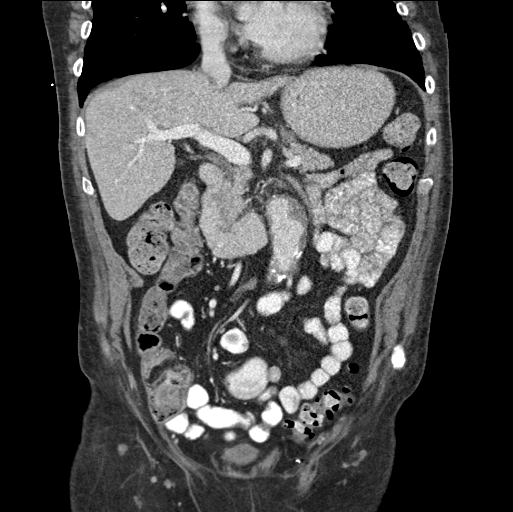
[im 56/101  soft-tissue]
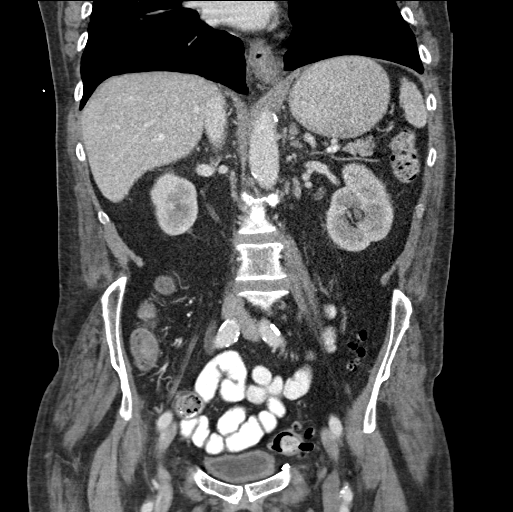

[16 of 46 positions shown; findings below may reference images not displayed]

RADIATION DOSE REDUCTION: This exam was performed according to the
departmental dose-optimization program which includes automated
exposure control, adjustment of the mA and/or kV according to
patient size and/or use of iterative reconstruction technique.

CONTRAST:  100mL OMNIPAQUE IOHEXOL 300 MG/ML  SOLN
FINDINGS: Lower chest: Lung bases are clear. No effusion or consolidative
changes.

Hepatobiliary: No focal, suspicious hepatic lesion. No
pericholecystic stranding. No biliary duct dilation. Portal vein is
patent.

Pancreas: Normal contour.  No signs of pancreatic inflammation.

Spleen: Normal size and contour.

Adrenals/Urinary Tract:

Adrenal glands are unremarkable. Symmetric renal enhancement. No
sign of hydronephrosis. No suspicious renal lesion or perinephric
stranding.

Urinary bladder is grossly unremarkable.

Small low-density lesion arising from the inferior RIGHT kidney
compatible with a small cyst measuring 18 mm. No follow-up
recommended for this finding.

Other small low-density lesions too small for definitive
characterization in the bilateral kidneys, statistically cysts. No
dedicated follow-up recommended for these findings. Based on current
guidelines.

Stomach/Bowel: Mild circumferential distal esophageal thickening. No
signs of stranding adjacent to the stomach.

No signs of small bowel dilation or inflammation. Appendix is
normal. No signs of colonic thickening or pericolonic stranding.
Sigmoid diverticular changes without adjacent inflammation.

Vascular/Lymphatic: 3.3 x 3.2 cm infrarenal abdominal aortic
aneurysm of the infrarenal abdominal aorta. Eccentric soft plaque
and moderate to marked calcified atherosclerosis of the abdominal
aorta. No adenopathy in the abdomen.

No adenopathy in the pelvis.

Reproductive: Unremarkable by CT.

Other: LEFT inguinal herniorrhaphy.  No free air or ascites.

Musculoskeletal: No acute bone finding. No destructive bone process.
Spinal degenerative changes. Degenerative changes about the RIGHT
hip post RIGHT femoral ORIF of the proximal RIGHT femur.
IMPRESSION: 1. No acute findings in the abdomen or pelvis.
2. Mild circumferential distal esophageal thickening. Correlate with
any signs of esophagitis with dedicated esophageal evaluation as
warranted.
3. 3.3 cm infrarenal abdominal aortic aneurysm. Recommend follow-up
every 3 years.
Reference: [HOSPITAL] 4303;[DATE].
4. Sigmoid diverticular changes without evidence of acute
diverticulitis.

Aortic Atherosclerosis ([id number]-BOO.O).

## 2022-11-25 MED ORDER — KCL IN DEXTROSE-NACL 40-5-0.9 MEQ/L-%-% IV SOLN
INTRAVENOUS | Status: DC
  Filled 2022-11-25 (×6): qty 1000

## 2022-11-25 MED ORDER — MAGNESIUM SULFATE 2 GM/50ML IV SOLN
2.0000 g | Freq: Once | INTRAVENOUS | Status: AC
  Administered 2022-11-25: 2 g via INTRAVENOUS
  Filled 2022-11-25: qty 50

## 2022-11-25 MED ORDER — CARVEDILOL 6.25 MG PO TABS
6.2500 mg | ORAL_TABLET | Freq: Two times a day (BID) | ORAL | 1 refills | Status: DC
  Filled 2022-11-25: qty 60, 30d supply, fill #0

## 2022-11-25 MED ORDER — HYDRALAZINE HCL 25 MG PO TABS
25.0000 mg | ORAL_TABLET | Freq: Three times a day (TID) | ORAL | 1 refills | Status: DC
  Filled 2022-11-25: qty 90, 30d supply, fill #0

## 2022-11-25 MED ORDER — POTASSIUM CHLORIDE CRYS ER 20 MEQ PO TBCR
40.0000 meq | EXTENDED_RELEASE_TABLET | Freq: Once | ORAL | Status: AC
  Administered 2022-11-25: 40 meq via ORAL
  Filled 2022-11-25: qty 2

## 2022-11-25 NOTE — Anesthesia Postprocedure Evaluation (Signed)
Anesthesia Post Note  Patient: [name]  Procedure(s) Performed: ESOPHAGOGASTRODUODENOSCOPY (EGD) WITH PROPOFOL COLONOSCOPY WITH PROPOFOL BIOPSY POLYPECTOMY     Patient location during evaluation: Endoscopy Anesthesia Type: MAC Level of consciousness: awake Pain management: pain level controlled Vital Signs Assessment: post-procedure vital signs reviewed and stable Respiratory status: spontaneous breathing, nonlabored ventilation and respiratory function stable Cardiovascular status: blood pressure returned to baseline and stable Postop Assessment: no apparent nausea or vomiting Anesthetic complications: no   No notable events documented.  Last Vitals:  Vitals:   11/24/22 1959 11/25/22 0518  BP: (!) 152/87 136/84  Pulse: 91 81  Resp: 18 18  Temp: 36.9 C 37.1 C  SpO2: 95% 95%    Last Pain:  Vitals:   11/25/22 0518  TempSrc: Oral  PainSc:                  Catheryn Bacon Jarrett [place]

## 2022-11-25 NOTE — Progress Notes (Signed)
[facility] Progress Note  Patient: Luis Marquez    WUJ:[id number]  DOA: 11/22/2022    Date of Service: the patient was seen and examined on 11/25/2022  Brief hospital course: ary Beeks is a 62 y.o. male with medical history significant of chronic HFrEF with recent LVEF 42% on stress test, nonischemic cardiomyopathy, PAF on Eliquis, COPD, alcohol abuse, diverticulosis, who presented to the emergency room on 5/7 with rectal bleed.   Patient has been doing GI prep since 5/6 morning for a incoming elective per 10 years screening colonoscopy today.  After multiple episodes of bowel movements after drinking the liquid preparation fluid, patient started to have rectal bleeding.  3 episode since morning of presentation and last dose of Eliquis for 5 days prior.  Hemoglobin at 12.6 with baseline of 13-14  Assessment and Plan: Hematochezia-resolved -Clinically suspect bleeding source is diverticulosis as patient's Hx and previous CT abd has showed.  Hemoglobin remained stable.  Status post EGD and colonoscopy which was unrevealing.  Bleeding feels to be from internal hemorrhoids and plan is to restart Eliquis once patient able to take p.o.  See below.  Patient post scope started complaining of abdominal distention and pain.  This persisted into 5/10.  Abdominal exam reveals scant bowel sounds and x-ray confirms ileus.  Patient made n.p.o. and correcting her electrolytes.  Fluids for hydration.   HTN, poorly controlled -Change metoprolol to Coreg, and adjusted losartan dosage.  Blood pressures have since improved  GERD with history of severe esophagitis with bleeding in May 2023 GI had recommended repeat EGD to assess for healing, but this was not done.  Plan is to repeat EGD also today.  On twice daily PPI   History of PAF -Continue to hold off Eliquis -SCD for DVT prophylaxis   Acute on chronic systolic heart failure-resolved Echocardiogram done April 2023 notes ejection fraction of 20 to 25%  with indeterminate diastolic function.  Holding off IV fluids given stability, BNP mildly elevated at 290.  Lasix 20 mg IV x 2 doses.  Once ileus resolved, will recheck BNP   History of alcohol abuse -No symptoms or signs of active withdrawal, start CIWA protocol with as needed benzos  Obesity Meets criteria BMI greater than 30    Body mass index is 30.34 kg/m.        Consultants: Gastroenterology  Procedures: Status post colonoscopy and EGD 5/9  Antimicrobials: None  Code Status: Full code   Subjective: Patient complains of abdominal pain, nausea and distention  Objective: Blood pressure improved Vitals:   11/25/22 0848 11/25/22 1256  BP:  129/66  Pulse:  76  Resp:  17  Temp:  99.3 F (37.4 C)  SpO2: 98% 97%    Intake/Output Summary (Last 24 hours) at 11/25/2022 1417 Last data filed at 11/25/2022 0100 Gross per 24 hour  Intake --  Output 500 ml  Net -500 ml    Filed Weights   11/22/22 1006 11/24/22 1211  Weight: 104.3 kg 104.3 kg   Body mass index is 30.34 kg/m.  Exam:  General: Alert and oriented x 3, no acute distress HEENT: Normocephalic, atraumatic, mucous membranes are moist Cardiovascular: Regular rate and rhythm, S1-S2 Respiratory: Clear to auscultation bilaterally Abdomen: Soft, mildly distended, generalized tenderness, scant bowel sounds Musculoskeletal: No clubbing or cyanosis or edema Skin: No skin breaks, tears or lesions Psychiatry: Appropriate, no evidence of psychoses Neurology: No focal deficits  Data Reviewed: Potassium 3.1, magnesium of 1.4.  Disposition:  Status is: Inpatient Remains inpatient  appropriate because:  -Correction of electrolytes -Resolution of ileus    Anticipated discharge date: 5/11  Family Communication: Declined for me to call anyone DVT Prophylaxis: SCDs Start: 11/22/22 1405    Author: Hollice Espy ,MD 11/25/2022 2:17 PM  To reach On-call, see care teams to locate the attending and reach  out via [email or web address]. Between 7PM-7AM, please contact night-coverage If you still have difficulty reaching the attending provider, please page the [facility] (Director on Call) for [facility] on amion for assistance.

## 2022-11-25 NOTE — Progress Notes (Signed)
Mobility Specialist - Progress Note   11/25/22 1323  Mobility  Activity Ambulated with assistance in hallway  Level of Assistance Standby assist, set-up cues, supervision of patient - no hands on  Assistive Device Cane  Distance Ambulated (ft) 200 ft  Range of Motion/Exercises Active  Activity Response Tolerated well  Mobility Referral Yes  $Mobility charge 1 Mobility  Mobility Specialist Start Time (ACUTE ONLY) 1315  Mobility Specialist Stop Time (ACUTE ONLY) 1323  Mobility Specialist Time Calculation (min) (ACUTE ONLY) 8 min   Pt received in bed and agreed to mobility. No c/o pain nor discomfort, 2 standing rest breaks to combat fatigue, returned to bed with all needs met.  Marilynne Halsted Mobility Specialist

## 2022-11-26 ENCOUNTER — Inpatient Hospital Stay (HOSPITAL_COMMUNITY): Payer: 59

## 2022-11-26 ENCOUNTER — Encounter: Payer: Self-pay | Admitting: Internal Medicine

## 2022-11-26 DIAGNOSIS — E669 Obesity, unspecified: Secondary | ICD-10-CM | POA: Diagnosis not present

## 2022-11-26 DIAGNOSIS — K567 Ileus, unspecified: Secondary | ICD-10-CM | POA: Diagnosis not present

## 2022-11-26 DIAGNOSIS — I5022 Chronic systolic (congestive) heart failure: Secondary | ICD-10-CM | POA: Diagnosis not present

## 2022-11-26 LAB — BASIC METABOLIC PANEL
Anion gap: 7 (ref 5–15)
BUN: 10 mg/dL (ref 8–23)
CO2: 24 mmol/L (ref 22–32)
Calcium: 7.7 mg/dL — ABNORMAL LOW (ref 8.9–10.3)
Chloride: 108 mmol/L (ref 98–111)
Creatinine, Ser: 0.7 mg/dL (ref 0.61–1.24)
GFR, Estimated: >60 mL/min (ref >60)
Glucose, Bld: 84 mg/dL (ref 70–99)
Potassium: 3.8 mmol/L (ref 3.5–5.1)
Sodium: 139 mmol/L (ref 135–145)

## 2022-11-26 LAB — MAGNESIUM: Magnesium: 2.2 mg/dL (ref 1.7–2.4)

## 2022-11-26 NOTE — Progress Notes (Signed)
[facility] Progress Note  Patient: Luis Marquez    ZOX:[id number]  DOA: 11/22/2022    Date of Service: the patient was seen and examined on 11/26/2022  Brief hospital course: ary Littrel is a 62 y.o. male with medical history significant of chronic HFrEF with recent LVEF 42% on stress test, nonischemic cardiomyopathy, PAF on Eliquis, COPD, alcohol abuse, diverticulosis, who presented to the emergency room on 5/7 with rectal bleed.   Patient has been doing GI prep since 5/6 morning for a incoming elective per 10 years screening colonoscopy today.  After multiple episodes of bowel movements after drinking the liquid preparation fluid, patient started to have rectal bleeding.  3 episode since morning of presentation and last dose of Eliquis for 5 days prior.  Hemoglobin at 12.6 with baseline of 13-14  Assessment and Plan: Hematochezia-resolved -Clinically suspect bleeding source is diverticulosis as patient's Hx and previous CT abd has showed.  Hemoglobin remained stable.  Status post EGD and colonoscopy which was unrevealing.  Bleeding feels to be from internal hemorrhoids and plan is to restart Eliquis once patient able to take p.o.  See below.  Patient post scope started complaining of abdominal distention and pain.  This persisted into 5/10.  Abdominal exam reveals scant bowel sounds and x-ray confirms ileus.  Patient made n.p.o. and correcting her electrolytes.  Fluids for hydration.  Follow-up x-ray today notes continued ileus although patient states he is passing gas.  Abdominal exam does not bowel sound so we will start on clear liquids.   HTN, poorly controlled -Change metoprolol to Coreg, and adjusted losartan dosage.  Blood pressures have since improved  GERD with history of severe esophagitis with bleeding in May 2023 GI had recommended repeat EGD to assess for healing, but this was not done.  Plan is to repeat EGD also today.  On twice daily PPI   History of PAF -Continue to hold  off Eliquis -SCD for DVT prophylaxis   Acute on chronic systolic heart failure-resolved Echocardiogram done April 2023 notes ejection fraction of 20 to 25% with indeterminate diastolic function.  Holding off IV fluids given stability, BNP mildly elevated at 290.  Lasix 20 mg IV x 2 doses.  With ileus resolving, recheck BNP in the morning.   History of alcohol abuse -No symptoms or signs of active withdrawal, start CIWA protocol with as needed benzos  Obesity Meets criteria BMI greater than 30    Body mass index is 30.34 kg/m.        Consultants: Gastroenterology  Procedures: Status post colonoscopy and EGD 5/9  Antimicrobials: None  Code Status: Full code   Subjective: Still abdominal discomfort although improved.  States he is passing gas.  Objective: Blood pressure improved Vitals:   11/26/22 0539 11/26/22 0833  BP: (!) 140/83   Pulse: 72   Resp: 16   Temp: 98.3 F (36.8 C)   SpO2: 94% 95%    Intake/Output Summary (Last 24 hours) at 11/26/2022 1409 Last data filed at 11/26/2022 0406 Gross per 24 hour  Intake 919.64 ml  Output 275 ml  Net 644.64 ml    Filed Weights   11/22/22 1006 11/24/22 1211  Weight: 104.3 kg 104.3 kg   Body mass index is 30.34 kg/m.  Exam:  General: Alert and oriented x 3, no acute distress HEENT: Normocephalic, atraumatic, mucous membranes are moist Cardiovascular: Regular rate and rhythm, S1-S2 Respiratory: Clear to auscultation bilaterally Abdomen: Soft, mildly distended, generalized tenderness, increased bowel sounds Musculoskeletal: No clubbing or cyanosis  or edema Skin: No skin breaks, tears or lesions Psychiatry: Appropriate, no evidence of psychoses Neurology: No focal deficits  Data Reviewed: Potassium 3.1, magnesium of 1.4.  Disposition:  Status is: Inpatient Remains inpatient appropriate because:  -Correction of electrolytes -Resolution of ileus  -Advancement of diet  Anticipated discharge date:  5/12  Family Communication: Declined for me to call anyone DVT Prophylaxis: SCDs Start: 11/22/22 1405    Author: Hollice Espy ,MD 11/26/2022 2:09 PM  To reach On-call, see care teams to locate the attending and reach out via [email or web address]. Between 7PM-7AM, please contact night-coverage If you still have difficulty reaching the attending provider, please page the [facility] (Director on Call) for [facility] on amion for assistance.

## 2022-11-26 NOTE — Plan of Care (Signed)
Pt continues to have headache probably related to lack of food.

## 2022-11-27 DIAGNOSIS — K567 Ileus, unspecified: Secondary | ICD-10-CM | POA: Diagnosis not present

## 2022-11-27 DIAGNOSIS — E669 Obesity, unspecified: Secondary | ICD-10-CM | POA: Diagnosis not present

## 2022-11-27 DIAGNOSIS — I5022 Chronic systolic (congestive) heart failure: Secondary | ICD-10-CM | POA: Diagnosis not present

## 2022-11-27 LAB — BRAIN NATRIURETIC PEPTIDE: B Natriuretic Peptide: 165 pg/mL — ABNORMAL HIGH (ref 0.0–100.0)

## 2022-11-27 MED ORDER — MELATONIN 5 MG PO TABS
5.0000 mg | ORAL_TABLET | Freq: Every evening | ORAL | Status: DC | PRN
  Administered 2022-11-27 – 2022-11-28 (×2): 5 mg via ORAL
  Filled 2022-11-27 (×2): qty 1

## 2022-11-27 NOTE — Progress Notes (Signed)
[facility] Progress Note  Patient: Luis Marquez    NWG:[id number]  DOA: 11/22/2022    Date of Service: the patient was seen and examined on 11/27/2022  Brief hospital course: Luis Marquez is a 62 y.o. male with medical history significant of chronic HFrEF with recent LVEF 42% on stress test, nonischemic cardiomyopathy, PAF on Eliquis, COPD, alcohol abuse, diverticulosis, who presented to the emergency room on 5/7 with rectal bleed.   Patient has been doing GI prep since 5/6 morning for a incoming elective per 10 years screening colonoscopy today.  After multiple episodes of bowel movements after drinking the liquid preparation fluid, patient started to have rectal bleeding.  3 episode since morning of presentation and last dose of Eliquis for 5 days prior.  Hemoglobin at 12.6 with baseline of 13-14  Assessment and Plan: Hematochezia-resolved -Clinically suspect bleeding source is diverticulosis as patient's Hx and previous CT abd has showed.  Hemoglobin remained stable.  Status post EGD and colonoscopy which was unrevealing.  Bleeding feels to be from internal hemorrhoids and plan is to restart Eliquis once patient able to take p.o.  See below.  Patient post scope started complaining of abdominal distention and pain.  This persisted into 5/10.  Abdominal exam reveals scant bowel sounds and x-ray confirms ileus.  Patient made n.p.o. and correcting her electrolytes.  Fluids for hydration.  Follow-up x-ray today notes continued ileus although patient states he is passing gas.  Tolerated clear liquids with minimal discomfort and started on full liquids by evening of 5/13.  Still some mild discomfort.  No bowel movement.  Will keep on full liquids advance to soft solid food this evening   HTN, poorly controlled -Change metoprolol to Coreg, and adjusted losartan dosage.  Blood pressures have since improved  GERD with history of severe esophagitis with bleeding in May 2023 GI had recommended repeat  EGD to assess for healing, but this was not done.  Plan is to repeat EGD also today.  On twice daily PPI   History of PAF -Continue to hold off Eliquis -SCD for DVT prophylaxis   Acute on chronic systolic heart failure-resolved Echocardiogram done April 2023 notes ejection fraction of 20 to 25% with indeterminate diastolic function.  Holding off IV fluids given stability, BNP mildly elevated at 290.  Lasix 20 mg IV x 2 doses.  BNP down to 165 x 5/12   History of alcohol abuse -No symptoms or signs of active withdrawal, start CIWA protocol with as needed benzos  Obesity Meets criteria BMI greater than 30    Body mass index is 30.34 kg/m.        Consultants: Gastroenterology  Procedures: Status post colonoscopy and EGD 5/9  Antimicrobials: None  Code Status: Full code   Subjective: Mild abdominal discomfort, passing gas  Objective: Blood pressure improved Vitals:   11/26/22 2140 11/27/22 0557  BP: 128/83 129/80  Pulse: 70 70  Resp: 15 15  Temp: 99 F (37.2 C) 98.6 F (37 C)  SpO2: 97% 97%    Intake/Output Summary (Last 24 hours) at 11/27/2022 1032 Last data filed at 11/27/2022 0900 Gross per 24 hour  Intake 240 ml  Output 550 ml  Net -310 ml    Filed Weights   11/22/22 1006 11/24/22 1211  Weight: 104.3 kg 104.3 kg   Body mass index is 30.34 kg/m.  Exam:  General: Alert and oriented x 3, no acute distress HEENT: Normocephalic, atraumatic, mucous membranes are moist Cardiovascular: Regular rate and rhythm, S1-S2 Respiratory:  Clear to auscultation bilaterally Abdomen: Soft, mildly distended, minimal tenderness, increasing bowel sounds Musculoskeletal: No clubbing or cyanosis or edema Skin: No skin breaks, tears or lesions Psychiatry: Appropriate, no evidence of psychoses Neurology: No focal deficits  Data Reviewed: BNP of 165  Disposition:  Status is: Inpatient Remains inpatient appropriate because:  -Correction of electrolytes  -Advancement  of diet  Anticipated discharge date: 5/13  Family Communication: Declined for me to call anyone DVT Prophylaxis: SCDs Start: 11/22/22 1405    Author: Hollice Espy ,MD 11/27/2022 10:32 AM  To reach On-call, see care teams to locate the attending and reach out via [email or web address]. Between 7PM-7AM, please contact night-coverage If you still have difficulty reaching the attending provider, please page the [facility] (Director on Call) for [facility] on amion for assistance.

## 2022-11-27 NOTE — Plan of Care (Signed)

## 2022-11-28 ENCOUNTER — Other Ambulatory Visit (HOSPITAL_COMMUNITY): Payer: Self-pay

## 2022-11-28 ENCOUNTER — Ambulatory Visit (HOSPITAL_COMMUNITY): Admit: 2022-11-28 | Payer: 59 | Admitting: Gastroenterology

## 2022-11-28 ENCOUNTER — Encounter (HOSPITAL_COMMUNITY): Payer: Self-pay

## 2022-11-28 ENCOUNTER — Encounter (HOSPITAL_COMMUNITY): Payer: Self-pay | Admitting: Internal Medicine

## 2022-11-28 DIAGNOSIS — E669 Obesity, unspecified: Secondary | ICD-10-CM | POA: Diagnosis not present

## 2022-11-28 DIAGNOSIS — K567 Ileus, unspecified: Secondary | ICD-10-CM | POA: Diagnosis not present

## 2022-11-28 DIAGNOSIS — I5022 Chronic systolic (congestive) heart failure: Secondary | ICD-10-CM | POA: Diagnosis not present

## 2022-11-28 LAB — BASIC METABOLIC PANEL
Anion gap: 5 (ref 5–15)
BUN: 8 mg/dL (ref 8–23)
CO2: 20 mmol/L — ABNORMAL LOW (ref 22–32)
Calcium: 7.7 mg/dL — ABNORMAL LOW (ref 8.9–10.3)
Chloride: 109 mmol/L (ref 98–111)
Creatinine, Ser: 0.62 mg/dL (ref 0.61–1.24)
GFR, Estimated: >60 mL/min (ref >60)
Glucose, Bld: 101 mg/dL — ABNORMAL HIGH (ref 70–99)
Potassium: 3.9 mmol/L (ref 3.5–5.1)
Sodium: 134 mmol/L — ABNORMAL LOW (ref 135–145)

## 2022-11-28 LAB — MAGNESIUM: Magnesium: 1.5 mg/dL — ABNORMAL LOW (ref 1.7–2.4)

## 2022-11-28 SURGERY — COLONOSCOPY WITH PROPOFOL
Anesthesia: Monitor Anesthesia Care

## 2022-11-28 MED ORDER — MAGNESIUM OXIDE -MG SUPPLEMENT 400 (240 MG) MG PO TABS
800.0000 mg | ORAL_TABLET | Freq: Once | ORAL | Status: AC
  Administered 2022-11-28: 800 mg via ORAL
  Filled 2022-11-28: qty 2

## 2022-11-28 MED ORDER — TRAMADOL HCL 50 MG PO TABS
50.0000 mg | ORAL_TABLET | Freq: Four times a day (QID) | ORAL | Status: DC | PRN
  Administered 2022-11-28: 50 mg via ORAL
  Filled 2022-11-28: qty 1

## 2022-11-28 NOTE — Discharge Summary (Addendum)
Physician Discharge Summary   Patient: Luis Marquez MRN: [id number] DOB: 1961/02/01  Admit date:     11/22/2022  Discharge date: 11/28/22  Discharge Physician: Hollice Espy   PCP: Claiborne Rigg, NP   Recommendations at discharge:   New medication: Coreg 6.25 p.o. twice daily New medication: Hydralazine 25 mg p.o. every 8 hours Medication change: Metoprolol discontinued Medication change: Advil as needed discontinued  Discharge Diagnoses: Principal Problem:   GI bleed Active Problems:   Chronic systolic CHF (congestive heart failure) (HCC)   Hematochezia   Diverticulosis of intestine with bleeding   Gastroesophageal reflux disease without esophagitis   Irregular Z line of esophagus   Benign neoplasm of ascending colon   Benign neoplasm of descending colon   Benign neoplasm of sigmoid colon   Benign neoplasm of rectum  Resolved Problems:   * No resolved hospital problems. *  Hospital Course: 62 y.o. male with medical history significant of chronic HFrEF with recent LVEF 42% on stress test, nonischemic cardiomyopathy, PAF on Eliquis, COPD, alcohol abuse, diverticulosis, who presented to the emergency room on 5/7 with rectal bleed.   Patient had been doing GI prep since 5/6 morning for an upcoming screening colonoscopy.  After multiple episodes of bowel movements after drinking the liquid preparation fluid, patient started to have rectal bleeding.  3 episode since morning of presentation and last dose of Eliquis for 5 days prior.  Hemoglobin at 12.6 on admission with baseline of 13-14  Assessment and Plan: Hematochezia-resolved -Clinically suspect bleeding source is diverticulosis as patient's Hx and previous CT abd has showed.  Hemoglobin remained stable.  Status post EGD and colonoscopy which was unrevealing.  Bleeding feels to be from internal hemorrhoids and plan is to restart Eliquis once patient able to take p.o.  See below.   Ileus-resolved: Patient post scope  started complaining of abdominal distention and pain.  This persisted into 5/10.  Abdominal exam reveals scant bowel sounds and x-ray confirms ileus.  Patient made n.p.o. and correcting her electrolytes.  Fluids for hydration.  Follow-up x-ray 5/12 noted continued ileus although patient states he is passing gas.  He was started on clear liquids and diet was slowly advanced and by 5/13, stable.     HTN, poorly controlled -Change metoprolol to Coreg for better heart rate control, and adjusted losartan dosage.  Blood pressures have since improved   GERD with history of severe esophagitis with bleeding in May 2023 GI had recommended repeat EGD to assess for healing, but this was not done.  Plan is to repeat EGD also today.  On twice daily PPI   History of PAF Eliquis resumed post scope.  Patient discontinued on as needed Advil   Acute on chronic systolic heart failure-resolved Echocardiogram done April 2023 notes ejection fraction of 20 to 25% with indeterminate diastolic function.  Holding off IV fluids given stability, BNP mildly elevated at 290.  Lasix 20 mg IV x 2 doses.  BNP down to 165 x 5/12.  He is already on Cozaar.   History of alcohol abuse -No symptoms or signs of active withdrawal, start CIWA protocol with as needed benzos   Obesity Meets criteria BMI greater than 30  Left arm pain: Nursing noted on morning of 5/13 that patient's IV had infiltrated.  Patient was receiving D5 normal saline with 40 of KCl.  Patient noted some mild pain, but not severe.  No skin changes were noted during my exam.  Discussed with pharmacy, no need for IV  potassium Antidotum given minimal infiltration and so treated with heat packs.      Consultants: Gastroenterology Procedures performed: Colonoscopy and endoscopy Disposition: Home Diet recommendation:  Discharge Diet Orders (From admission, onward)     Start     Ordered   11/28/22 0000  Diet - low sodium heart healthy        11/28/22 1151            Cardiac diet DISCHARGE MEDICATION: Allergies as of 11/28/2022   No Known Allergies      Medication List     STOP taking these medications    ibuprofen 200 MG tablet Commonly known as: ADVIL   metoprolol succinate 25 MG 24 hr tablet Commonly known as: TOPROL-XL       TAKE these medications    B-Complex Balanced Tabs Take 1 tablet by mouth daily.   carvedilol 6.25 MG tablet Commonly known as: COREG Take 1 tablet (6.25 mg total) by mouth 2 (two) times daily with a meal.   Eliquis 5 MG Tabs tablet Generic drug: apixaban Take 1 tablet (5 mg total) by mouth 2 (two) times daily.   hydrALAZINE 25 MG tablet Commonly known as: APRESOLINE Take 1 tablet (25 mg total) by mouth every 8 (eight) hours.   losartan 100 MG tablet Commonly known as: COZAAR Take 1 tablet (100 mg total) by mouth daily.   multivitamin tablet Take 1 tablet by mouth daily.   Omega-3 Fish Oil 1200 MG Caps Take 1 capsule by mouth daily.   OSTEO BI-FLEX TRIPLE STRENGTH PO Take 1 mg by mouth 2 (two) times daily.   pantoprazole 40 MG tablet Commonly known as: PROTONIX Take 1 tablet (40 mg total) by mouth 2 (two) times daily.   Plenvu 140 g Solr Generic drug: PEG-KCl-NaCl-NaSulf-Na Asc-C Take 1 kit by mouth as directed. Use coupon: BIN: [id number] PNC: CNRX Group: [id number] ID: [id number]   potassium chloride SA 20 MEQ tablet Commonly known as: KLOR-CON M Take 2 tablets (40 mEq total) by mouth daily at 12 noon for 1 day, THEN 1 tablet (20 mEq total) daily at 12 noon. Start taking on: October 10, 2022   Symbicort 160-4.5 MCG/ACT inhaler Generic drug: budesonide-formoterol Inhale 2 puffs into the lungs 2 (two) times daily.   tetrahydrozoline 0.05 % ophthalmic solution Place 2 drops into both eyes daily.   Ventolin HFA 108 (90 Base) MCG/ACT inhaler Generic drug: albuterol Inhale 2 puffs into the lungs every 6 (six) hours as needed for wheezing or shortness of breath.         Discharge Exam: Filed Weights   11/22/22 1006 11/24/22 1211  Weight: 104.3 kg 104.3 kg   General: Alert and oriented x 3, no acute distress Abdomen: Soft, nontender, nondistended, positive bowel sounds  Condition at discharge: good  The results of significant diagnostics from this hospitalization (including imaging, microbiology, ancillary and laboratory) are listed below for reference.   Imaging Studies: DG Abd Portable 1V  Result Date: 11/26/2022 CLINICAL DATA:  [id number] Ileus (HCC) 98749 EXAM: PORTABLE ABDOMEN - 1 VIEW COMPARISON:  11/25/2022 FINDINGS: Continued mild gaseous distention of bowel, predominantly small bowel. This could reflect mild ileus or less likely low-grade distal small bowel obstruction. No real change since prior study. No organomegaly or free air. IMPRESSION: Continued mild gaseous distention of bowel, predominantly small bowel. This could reflect mild ileus or early low grade small bowel obstruction. No real change. Electronically Signed   By: Charlett Nose M.D.   On: 11/26/2022 07:36  DG Abd Portable 1V  Result Date: 11/25/2022 CLINICAL DATA:  98749 Ileus (HCC) 98749 EXAM: PORTABLE ABDOMEN - 1 VIEW COMPARISON:  CT 12/09/2021 FINDINGS: Mild gaseous dilation of small bowel. Normal caliber colon. Mild colonic stool burden. There is a chronic L1 compression deformity. Prior right proximal femur fixation with chronic fracture deformity. IMPRESSION: Mild gaseous dilation of small bowel, compatible with ileus, early obstruction is possible. Recommend radiographic follow-up. Electronically Signed   By: Caprice Renshaw M.D.   On: 11/25/2022 12:14    Microbiology: Results for orders placed or performed in visit on 11/26/21  Fecal occult blood, imunochemical([facility])     Status: None   Collection Time: 12/03/21  2:00 PM   Specimen: Stool   ST  Result Value Ref Range Status   Fecal Occult Bld Negative Negative Final    Labs: CBC: Recent Labs  Lab  11/22/22 1038 11/22/22 1328 11/22/22 1610 11/22/22 2142 11/23/22 0539 11/24/22 0548  WBC 5.6 6.2  --   --  5.8 5.7  NEUTROABS 3.9  --   --   --   --   --   HGB 13.3 12.6* 12.7* 12.4* 11.4* 11.8*  HCT 39.5 37.2* 38.2* 36.6* 33.7* 35.5*  MCV 99.2 98.7  --   --  98.8 99.2  PLT 225 224  --   --  195 194   Basic Metabolic Panel: Recent Labs  Lab 11/22/22 1038 11/25/22 0601 11/25/22 1054 11/26/22 0536 11/28/22 0502  NA 139 135  --  139 134*  K 3.8 3.1*  --  3.8 3.9  CL 102 104  --  108 109  CO2 28 23  --  24 20*  GLUCOSE 112* 106*  --  84 101*  BUN 8 8  --  10 8  CREATININE 0.80 0.75  --  0.70 0.62  CALCIUM 8.6* 7.7*  --  7.7* 7.7*  MG  --   --  1.4* 2.2 1.5*   Liver Function Tests: Recent Labs  Lab 11/22/22 1038 11/25/22 1054  AST 30 26  ALT 21 15  ALKPHOS 110 76  BILITOT 1.6* 0.9  PROT 6.4* 5.6*  ALBUMIN 3.1* 2.7*   CBG: No results for input(s): "GLUCAP" in the last 168 hours.  Discharge time spent: Greater than 30 minutes.  Signed: Hollice Espy, MD [facility] 11/28/2022

## 2022-11-29 ENCOUNTER — Telehealth: Payer: Self-pay

## 2022-11-29 NOTE — Transitions of Care (Post Inpatient/ED Visit) (Signed)
11/29/2022  Name: Luis Marquez MRN: [id number] DOB: 1961-03-18  Today's TOC FU Call Status: Today's TOC FU Call Status:: Successful TOC FU Call Competed TOC FU Call Complete Date: 11/29/22  Transition Care Management Follow-up Telephone Call Date of Discharge: 11/28/22 Discharge Facility: [facility]) Type of Discharge: Inpatient Admission Primary Inpatient Discharge Diagnosis:: GI Bleed How have you been since you were released from the hospital?: Better Any questions or concerns?: No  Items Reviewed: Did you receive and understand the discharge instructions provided?: Yes Medications obtained,verified, and reconciled?: No Medications Not Reviewed Reasons:: Other: (He said he has all medications except coreg and hydralazine which he plans to pick up today. He did not have any questions and he said he did not need to review the med list. He stated he understands he is to stop the metoprolol and ibuprofen) Any new allergies since your discharge?: No Dietary orders reviewed?: Yes Type of Diet Ordered:: heart healthy Do you have support at home?: Yes People in Home: alone Name of Support/Comfort Primary Source: has help if needed  Medications Reviewed Today:  he did not want to review the medication list  Medications Reviewed Today     Reviewed by Ralph Dowdy, RN (Registered Nurse) on 11/24/22 at 1306  Med List Status: Complete   Medication Order Taking? Sig Documenting Provider Last Dose Status Informant  albuterol (VENTOLIN HFA) 108 (90 Base) MCG/ACT inhaler [id number] Yes Inhale 2 puffs into the lungs every 6 (six) hours as needed for wheezing or shortness of breath. Hoy Register, MD 11/21/2022 Active Self  apixaban (ELIQUIS) 5 MG TABS tablet [id number] Yes Take 1 tablet (5 mg total) by mouth 2 (two) times daily. Claiborne Rigg, NP Past Week 23:00 Active Self  B Complex-C-Folic Acid (B-COMPLEX BALANCED) TABS [id number] Yes Take 1 tablet by mouth daily. [provider] 11/21/2022 Active Self  budesonide-formoterol (SYMBICORT) 160-4.5 MCG/ACT inhaler [id number] Yes Inhale 2 puffs into the lungs 2 (two) times daily. Hoy Register, MD 11/22/2022 Active Self  ibuprofen (ADVIL) 200 MG tablet [id number] Yes Take 800 mg by mouth daily. PRN [provider]  Active Self  losartan (COZAAR) 100 MG tablet [id number] Yes Take 1 tablet (100 mg total) by mouth daily. Bensimhon, Bevelyn Buckles, MD 11/22/2022 Active Self  metoprolol succinate (TOPROL-XL) 25 MG 24 hr tablet [id number] Yes Take 1 tablet (25 mg total) by mouth daily. Claiborne Rigg, NP 11/22/2022 09:00 Active Self  Misc Natural Products (OSTEO BI-FLEX TRIPLE STRENGTH PO) [id number] Yes Take 1 mg by mouth 2 (two) times daily. [provider] 11/21/2022 Active Self  Multiple Vitamin (MULTIVITAMIN) tablet [id number] Yes Take 1 tablet by mouth daily. [provider] 11/21/2022 Active Self  Omega-3 Fatty Acids (OMEGA-3 FISH OIL) 1200 MG CAPS [id number] Yes Take 1 capsule by mouth daily. [provider] 11/21/2022 Active Self  pantoprazole (PROTONIX) 40 MG tablet [id number] Yes Take 1 tablet (40 mg total) by mouth 2 (two) times daily. Claiborne Rigg, NP 11/21/2022 Active Self  PEG-KCl-NaCl-NaSulf-Na Asc-C (PLENVU) 140 g SOLR [id number]  Take 1 kit by mouth as directed. Use coupon: BIN: 694854 [facility]: CNRX Group: [id number] ID: [id number] Leta Baptist, PA-C  Active Self           Med Note (CARD, AMY L   Tue Nov 22, 2022  2:59 PM) Stopping due to bleeding.  potassium chloride SA (KLOR-CON M) 20 MEQ tablet [id number] Yes Take 2 tablets (40 mEq total) by mouth daily at 12 noon for 1  day, THEN 1 tablet (20 mEq total) daily at 12 noon. [place], [place] 11/21/2022 Active Self  tetrahydrozoline 0.05 % ophthalmic solution [id number] Yes Place 2 drops into both eyes daily. [provider] Past Week Active Self  Med List Note Astrid Divine 02/22/22 1138): Non-compliant with medications             Home Care and Equipment/Supplies: Were Home Health Services Ordered?: No Any new equipment or medical supplies ordered?: No  Functional Questionnaire: Do you need assistance with bathing/showering or dressing?: No Do you need assistance with meal preparation?: No Do you need assistance with eating?: No Do you have difficulty maintaining continence: No Do you need assistance with getting out of bed/getting out of a chair/moving?: No (He stated that he has a cane to use if needed) Do you have difficulty managing or taking your medications?: No  Follow up appointments reviewed: PCP Follow-up appointment confirmed?: Yes Date of PCP follow-up appointment?: 12/27/22 Follow-up Provider: Rockne Coons, NP.   he requested an afternoon appt. Specialist Hospital Follow-up appointment confirmed?: NA Do you need transportation to your follow-up appointment?: No Do you understand care options if your condition(s) worsen?: Yes-patient verbalized understanding    SIGNATURE  Robyne Peers, RN

## 2022-11-30 ENCOUNTER — Other Ambulatory Visit: Payer: Self-pay

## 2022-12-09 ENCOUNTER — Telehealth: Payer: Self-pay | Admitting: Internal Medicine

## 2022-12-09 NOTE — Telephone Encounter (Signed)
Patient called asking if he needs to make a FU since he had his endo at [facility]. Please advise thank you.

## 2022-12-09 NOTE — Telephone Encounter (Signed)
Pt wants to know if he needs to schedule an office F/U appt. Please advise.

## 2022-12-09 NOTE — Telephone Encounter (Signed)
Resolved diverticular bleeding Reasonable to check CBC, ferritin, IBC panel OV for persistent symptoms or GI concerns

## 2022-12-13 ENCOUNTER — Other Ambulatory Visit: Payer: Self-pay | Admitting: *Deleted

## 2022-12-13 DIAGNOSIS — K5791 Diverticulosis of intestine, part unspecified, without perforation or abscess with bleeding: Secondary | ICD-10-CM

## 2022-12-13 NOTE — Telephone Encounter (Signed)
Pt called and message left to notify of labs needed, and to come by at his convenience from 7:30-5pm M-  F.

## 2022-12-16 NOTE — Telephone Encounter (Signed)
Patient called to notify of lab draws needed, unable to contact; message left with lab hours of  operation. Informed no appt needed.

## 2022-12-23 ENCOUNTER — Other Ambulatory Visit (INDEPENDENT_AMBULATORY_CARE_PROVIDER_SITE_OTHER): Payer: 59

## 2022-12-23 DIAGNOSIS — K5791 Diverticulosis of intestine, part unspecified, without perforation or abscess with bleeding: Secondary | ICD-10-CM | POA: Diagnosis not present

## 2022-12-23 LAB — CBC WITH DIFFERENTIAL/PLATELET
Basophils Absolute: 0 10*3/uL (ref 0.0–0.1)
Basophils Relative: 0.5 % (ref 0.0–3.0)
Eosinophils Absolute: 0.2 10*3/uL (ref 0.0–0.7)
Eosinophils Relative: 1.8 % (ref 0.0–5.0)
HCT: 41.5 % (ref 39.0–52.0)
Hemoglobin: 13.7 g/dL (ref 13.0–17.0)
Lymphocytes Relative: 11.7 % — ABNORMAL LOW (ref 12.0–46.0)
Lymphs Abs: 1.2 10*3/uL (ref 0.7–4.0)
MCHC: 33.1 g/dL (ref 30.0–36.0)
MCV: 98.7 fl (ref 78.0–100.0)
Monocytes Absolute: 0.6 10*3/uL (ref 0.1–1.0)
Monocytes Relative: 6.4 % (ref 3.0–12.0)
Neutro Abs: 8.1 10*3/uL — ABNORMAL HIGH (ref 1.4–7.7)
Neutrophils Relative %: 79.6 % — ABNORMAL HIGH (ref 43.0–77.0)
Platelets: 285 10*3/uL (ref 150.0–400.0)
RBC: 4.21 Mil/uL — ABNORMAL LOW (ref 4.22–5.81)
RDW: 13.6 % (ref 11.5–15.5)
WBC: 10.2 10*3/uL (ref 4.0–10.5)

## 2022-12-23 LAB — IBC + FERRITIN
Ferritin: 222.5 ng/mL (ref 22.0–322.0)
Iron: 50 ug/dL (ref 42–165)
Saturation Ratios: 18.4 % — ABNORMAL LOW (ref 20.0–50.0)
TIBC: 271.6 ug/dL (ref 250.0–450.0)
Transferrin: 194 mg/dL — ABNORMAL LOW (ref 212.0–360.0)

## 2022-12-27 ENCOUNTER — Other Ambulatory Visit: Payer: Self-pay

## 2022-12-27 ENCOUNTER — Encounter: Payer: Self-pay | Admitting: Nurse Practitioner

## 2022-12-27 ENCOUNTER — Other Ambulatory Visit: Payer: Self-pay | Admitting: Family Medicine

## 2022-12-27 ENCOUNTER — Ambulatory Visit: Payer: 59 | Attending: Nurse Practitioner | Admitting: Nurse Practitioner

## 2022-12-27 VITALS — BP 105/66 | HR 113 | Ht 73.0 in | Wt 214.8 lb

## 2022-12-27 DIAGNOSIS — K5901 Slow transit constipation: Secondary | ICD-10-CM | POA: Diagnosis not present

## 2022-12-27 DIAGNOSIS — F17200 Nicotine dependence, unspecified, uncomplicated: Secondary | ICD-10-CM | POA: Diagnosis not present

## 2022-12-27 DIAGNOSIS — J439 Emphysema, unspecified: Secondary | ICD-10-CM

## 2022-12-27 DIAGNOSIS — Z09 Encounter for follow-up examination after completed treatment for conditions other than malignant neoplasm: Secondary | ICD-10-CM

## 2022-12-27 DIAGNOSIS — R911 Solitary pulmonary nodule: Secondary | ICD-10-CM

## 2022-12-27 DIAGNOSIS — I10 Essential (primary) hypertension: Secondary | ICD-10-CM

## 2022-12-27 MED ORDER — ALBUTEROL SULFATE HFA 108 (90 BASE) MCG/ACT IN AERS
2.0000 | INHALATION_SPRAY | Freq: Four times a day (QID) | RESPIRATORY_TRACT | 3 refills | Status: DC | PRN
  Filled 2022-12-27: qty 18, 25d supply, fill #0
  Filled 2023-02-28: qty 18, 25d supply, fill #1
  Filled 2023-05-25: qty 6.7, 25d supply, fill #2

## 2022-12-27 MED ORDER — SENNOSIDES-DOCUSATE SODIUM 8.6-50 MG PO TABS
1.0000 | ORAL_TABLET | Freq: Every day | ORAL | 1 refills | Status: DC
Start: 2022-12-27 — End: 2023-03-21
  Filled 2022-12-27: qty 90, 90d supply, fill #0

## 2022-12-27 MED ORDER — METOPROLOL SUCCINATE ER 25 MG PO TB24
25.0000 mg | ORAL_TABLET | Freq: Every day | ORAL | 3 refills | Status: DC
Start: 2022-12-27 — End: 2023-10-17
  Filled 2022-12-27 – 2023-02-27 (×2): qty 90, 90d supply, fill #0
  Filled 2023-06-26: qty 90, 90d supply, fill #1

## 2022-12-27 NOTE — Patient Instructions (Addendum)
Luis Marquez. [name], MD Address: [address] [place], [place], [place] [address] Phone: [phone]  Placed in [facility]  [address] [place], [place] [address]  Main: [phone]

## 2022-12-27 NOTE — Progress Notes (Signed)
Assessment & Plan:  Luis Marquez was seen today for hospitalization follow-up.  Diagnoses and all orders for this visit:  Hospital discharge follow-up  Tobacco dependence -     albuterol (VENTOLIN HFA) 108 (90 Base) MCG/ACT inhaler; Inhale 2 puffs into the lungs every 6 (six) hours as needed for wheezing or shortness of breath. -     Cancel: CT CHEST LCS NODULE F/U LOW DOSE WO CONTRAST; Future  Essential hypertension -     metoprolol succinate (TOPROL-XL) 25 MG 24 hr tablet; Take 1 tablet (25 mg total) by mouth daily. -     CMP14+EGFR  Slow transit constipation -     senna-docusate (SENOKOT-S) 8.6-50 MG tablet; Take 1 tablet by mouth daily. Stool softener  Pulmonary emphysema, unspecified emphysema type (HCC) -     Ambulatory referral to Pulmonology -     Cancel: CT CHEST LCS NODULE F/U LOW DOSE WO CONTRAST; Future -     Cancel: CT Chest W Contrast; Future -     CT Chest W Contrast; Future  Pulmonary nodule -     Cancel: CT CHEST LCS NODULE F/U LOW DOSE WO CONTRAST; Future -     Cancel: CT Chest W Contrast; Future -     CT Chest W Contrast; Future    Patient has been counseled on age-appropriate routine health concerns for screening and prevention. These are reviewed and up-to-date. Referrals have been placed accordingly. Immunizations are up-to-date or declined.    Subjective:   Chief Complaint  Patient presents with   Hospitalization Follow-up   [name] 62 y.o. male presents to office today for HFU  HFU 11-22-2022 through 11-28-2022 Patient had been doing GI prep since 5/6 morning for an upcoming screening colonoscopy.  After multiple episodes of bowel movements after drinking the liquid preparation fluid, patient started to have rectal bleeding.  3 episode since morning of presentation and last dose of Eliquis for 5 days prior.  Hemoglobin at 12.6 on admission with baseline of 13-14  CT abd showing diverticulosis. EGD and colonoscopy were unrevealing and he was also  noted to have internal hemorrhoids likely to have also caused the bleeding. Hospital course was also complicated by ileus which resolved with IVFs and NPO to slow advancing of diet. Due to HR and BP he was switched to coreg    Blood pressure is well controlled today. However today he is complaining of worsening shortness of breath since starting coreg and hydralazine and wants to resume his metoprolol instead. Does not like the way the 2 new medications are making him feel.  He is currently taking his losartan 100 mg daily  as prescribed. He does continue to smoke.   BP Readings from Last 3 Encounters:  12/27/22 105/66  11/28/22 (!) 140/84  11/15/22 121/78     Since his colonoscopy he does not feel his bowel motility is back to baseline. Has been using laxatives which I have advised against.  Has not been drinking enough water per his report.    Review of Systems  Constitutional:  Negative for fever, malaise/fatigue and weight loss.  HENT: Negative.  Negative for nosebleeds.   Eyes: Negative.  Negative for blurred vision, double vision and photophobia.  Respiratory:  Positive for shortness of breath. Negative for cough, hemoptysis, sputum production and wheezing.   Cardiovascular: Negative.  Negative for chest pain, palpitations, claudication, leg swelling and PND.  Gastrointestinal:  Positive for constipation. Negative for abdominal pain, blood in stool, diarrhea, heartburn, melena,  nausea and vomiting.  Musculoskeletal: Negative.  Negative for myalgias.  Neurological: Negative.  Negative for dizziness, focal weakness, seizures and headaches.  Psychiatric/Behavioral: Negative.  Negative for suicidal ideas.     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   Emphysema, unspecified (HCC)    ETOH abuse    Fracture, intertrochanteric, right femur (HCC) 11/11/2014   Hypertension    Hypomagnesemia    Laceration of head  11/03/2021   Paroxysmal A-fib (HCC)    Syncope 12/07/2021   Tobacco abuse     Past Surgical History:  Procedure Laterality Date   BIOPSY  12/10/2021   Procedure: BIOPSY;  Surgeon: Napoleon Form, MD;  Location: [facility] ENDOSCOPY;  Service: Gastroenterology;;   BIOPSY  11/24/2022   Procedure: BIOPSY;  Surgeon: Beverley Fiedler, MD;  Location: [facility] ENDOSCOPY;  Service: Gastroenterology;;   COLONOSCOPY WITH PROPOFOL N/A 11/24/2022   Procedure: COLONOSCOPY WITH PROPOFOL;  Surgeon: Beverley Fiedler, MD;  Location: [facility] ENDOSCOPY;  Service: Gastroenterology;  Laterality: N/A;   ESOPHAGOGASTRODUODENOSCOPY (EGD) WITH PROPOFOL N/A 12/10/2021   Procedure: ESOPHAGOGASTRODUODENOSCOPY (EGD) WITH PROPOFOL;  Surgeon: Napoleon Form, MD;  Location: [facility] ENDOSCOPY;  Service: Gastroenterology;  Laterality: N/A;   ESOPHAGOGASTRODUODENOSCOPY (EGD) WITH PROPOFOL N/A 11/24/2022   Procedure: ESOPHAGOGASTRODUODENOSCOPY (EGD) WITH PROPOFOL;  Surgeon: Beverley Fiedler, MD;  Location: [facility] ENDOSCOPY;  Service: Gastroenterology;  Laterality: N/A;   HEMOSTASIS CLIP PLACEMENT  12/10/2021   Procedure: HEMOSTASIS CLIP PLACEMENT;  Surgeon: Napoleon Form, MD;  Location: [facility] ENDOSCOPY;  Service: Gastroenterology;;   HERNIA REPAIR     INTRAMEDULLARY (IM) NAIL INTERTROCHANTERIC Right 11/11/2014   Procedure: INTRAMEDULLARY (IM) NAIL INTERTROCHANTRIC;  Surgeon: Durene Romans, MD;  Location: [facility] ORS;  Service: Orthopedics;  Laterality: Right;   POLYPECTOMY  11/24/2022   Procedure: POLYPECTOMY;  Surgeon: Beverley Fiedler, MD;  Location: [facility] ENDOSCOPY;  Service: Gastroenterology;;   RIGHT/LEFT HEART CATH AND CORONARY ANGIOGRAPHY N/A 11/11/2021   Procedure: RIGHT/LEFT HEART CATH AND CORONARY ANGIOGRAPHY;  Surgeon: Dolores Patty, MD;  Location: [facility] INVASIVE CV LAB;  Service: Cardiovascular;  Laterality: N/A;   Rod right leg      Family History  Problem Relation Age of Onset   Diabetes Mother    Colon cancer Neg Hx    Stomach cancer Neg Hx     Esophageal cancer Neg Hx     Social History Reviewed with no changes to be made today.   Outpatient Medications Prior to Visit  Medication Sig Dispense Refill   apixaban (ELIQUIS) 5 MG TABS tablet Take 1 tablet (5 mg total) by mouth 2 (two) times daily. 180 tablet 1   B Complex-C-Folic Acid (B-COMPLEX BALANCED) TABS Take 1 tablet by mouth daily.     budesonide-formoterol (SYMBICORT) 160-4.5 MCG/ACT inhaler Inhale 2 puffs into the lungs 2 (two) times daily. 10.2 g 3   losartan (COZAAR) 100 MG tablet Take 1 tablet (100 mg total) by mouth daily. 90 tablet 3   Misc Natural Products (OSTEO BI-FLEX TRIPLE STRENGTH PO) Take 1 mg by mouth 2 (two) times daily.     Multiple Vitamin (MULTIVITAMIN) tablet Take 1 tablet by mouth daily.     Omega-3 Fatty Acids (OMEGA-3 FISH OIL) 1200 MG CAPS Take 1 capsule by mouth daily.     pantoprazole (PROTONIX) 40 MG tablet Take 1 tablet (40 mg total) by mouth 2 (two) times daily. 180 tablet 2   PEG-KCl-NaCl-NaSulf-Na Asc-C (PLENVU) 140 g  SOLR Take 1 kit by mouth as directed. Use coupon: BIN: 161096 PNC: CNRX Group: [id number] ID: [id number] 3 each 0   potassium chloride SA (KLOR-CON M) 20 MEQ tablet Take 2 tablets (40 mEq total) by mouth daily at 12 noon for 1 day, THEN 1 tablet (20 mEq total) daily at 12 noon. 31 tablet 6   tetrahydrozoline 0.05 % ophthalmic solution Place 2 drops into both eyes daily.     albuterol (VENTOLIN HFA) 108 (90 Base) MCG/ACT inhaler Inhale 2 puffs into the lungs every 6 (six) hours as needed for wheezing or shortness of breath. 18 g 0   carvedilol (COREG) 6.25 MG tablet Take 1 tablet (6.25 mg total) by mouth 2 (two) times daily with a meal. 60 tablet 1   hydrALAZINE (APRESOLINE) 25 MG tablet Take 1 tablet (25 mg total) by mouth every 8 (eight) hours. 90 tablet 1   No facility-administered medications prior to visit.    No Known Allergies     Objective:    BP 105/66 (BP Location: Left Arm, Patient Position: Sitting, Cuff Size:  Normal)   Pulse (!) 113   Ht 6\' 1"  (1.854 m)   Wt 214 lb 12.8 oz (97.4 kg)   SpO2 95%   BMI 28.34 kg/m  Wt Readings from Last 3 Encounters:  12/27/22 214 lb 12.8 oz (97.4 kg)  11/24/22 229 lb 15 oz (104.3 kg)  11/15/22 231 lb (104.8 kg)    Physical Exam Vitals and nursing note reviewed.  Constitutional:      Appearance: He is well-developed.  HENT:     Head: Normocephalic and atraumatic.  Cardiovascular:     Rate and Rhythm: Regular rhythm. Tachycardia present.     Heart sounds: Normal heart sounds. No murmur heard.    No friction rub. No gallop.  Pulmonary:     Effort: Pulmonary effort is normal. No tachypnea or respiratory distress.     Breath sounds: Normal breath sounds. No decreased breath sounds, wheezing, rhonchi or rales.  Chest:     Chest wall: No tenderness.  Abdominal:     General: Bowel sounds are normal.     Palpations: Abdomen is soft.  Musculoskeletal:        General: Normal range of motion.     Cervical back: Normal range of motion.  Skin:    General: Skin is warm and dry.  Neurological:     Mental Status: He is alert and oriented to person, place, and time.     Coordination: Coordination normal.  Psychiatric:        Behavior: Behavior normal. Behavior is cooperative.        Thought Content: Thought content normal.        Judgment: Judgment normal.          Patient has been counseled extensively about nutrition and exercise as well as the importance of adherence with medications and regular follow-up. The patient was given clear instructions to go to ER or return to medical center if symptoms don't improve, worsen or new problems develop. The patient verbalized understanding.   Follow-up: Return for see me in september. cancel july appt.Claiborne Rigg, FNP-BC [facility] [place], [place] [phone]   12/27/2022, 4:50 PM

## 2022-12-28 LAB — CMP14+EGFR
ALT: 16 IU/L (ref 0–44)
AST: 24 IU/L (ref 0–40)
Albumin/Globulin Ratio: 1.3
Albumin: 4.1 g/dL (ref 3.9–4.9)
Alkaline Phosphatase: 94 IU/L (ref 44–121)
BUN/Creatinine Ratio: 12 (ref 10–24)
BUN: 17 mg/dL (ref 8–27)
Bilirubin Total: 0.6 mg/dL (ref 0.0–1.2)
CO2: 17 mmol/L — ABNORMAL LOW (ref 20–29)
Calcium: 10.2 mg/dL (ref 8.6–10.2)
Chloride: 105 mmol/L (ref 96–106)
Creatinine, Ser: 1.39 mg/dL — ABNORMAL HIGH (ref 0.76–1.27)
Globulin, Total: 3.1 g/dL (ref 1.5–4.5)
Glucose: 113 mg/dL — ABNORMAL HIGH (ref 70–99)
Potassium: 4.7 mmol/L (ref 3.5–5.2)
Sodium: 139 mmol/L (ref 134–144)
Total Protein: 7.2 g/dL (ref 6.0–8.5)
eGFR: 57 mL/min/{1.73_m2} — ABNORMAL LOW (ref >59)

## 2022-12-30 ENCOUNTER — Other Ambulatory Visit: Payer: Self-pay | Admitting: Nurse Practitioner

## 2022-12-30 DIAGNOSIS — R7989 Other specified abnormal findings of blood chemistry: Secondary | ICD-10-CM

## 2022-12-30 NOTE — Progress Notes (Signed)
A prior auth has to be sent before scheduling. I have faxed this 12/30/22.

## 2023-01-02 ENCOUNTER — Telehealth: Payer: Self-pay

## 2023-01-02 NOTE — Telephone Encounter (Signed)
Pt given lab results per notes of Z. Meredeth Ide NP  on 01/02/23. Pt verbalized understanding. Lab appt 01/20/23.

## 2023-01-06 ENCOUNTER — Telehealth: Payer: Self-pay

## 2023-01-06 NOTE — Telephone Encounter (Signed)
Copied from CRM [id number]. Topic: General - Other >> Jan 06, 2023  3:00 PM Santiya F wrote: Reason for CRM: Tonya with [facility] is calling because pt is scheduled for a CT scan on 01/10/23 and pt's secondary insurance is requiring a Prior Authorization. Archie Patten can be reached at [phone] ext [phone]

## 2023-01-06 NOTE — Telephone Encounter (Signed)
Prior auth faxed

## 2023-01-10 ENCOUNTER — Ambulatory Visit (HOSPITAL_BASED_OUTPATIENT_CLINIC_OR_DEPARTMENT_OTHER): Payer: 59

## 2023-01-14 ENCOUNTER — Ambulatory Visit (HOSPITAL_BASED_OUTPATIENT_CLINIC_OR_DEPARTMENT_OTHER): Admission: RE | Admit: 2023-01-14 | Payer: 59 | Source: Ambulatory Visit

## 2023-01-18 ENCOUNTER — Telehealth: Payer: Self-pay

## 2023-01-18 NOTE — Telephone Encounter (Signed)
Call was made to [facility] [phone] ext [phone]  to relay the message that [employer] could not find the member in their system for the prior auth.

## 2023-01-20 ENCOUNTER — Other Ambulatory Visit: Payer: 59

## 2023-01-28 ENCOUNTER — Ambulatory Visit (HOSPITAL_BASED_OUTPATIENT_CLINIC_OR_DEPARTMENT_OTHER)
Admission: RE | Admit: 2023-01-28 | Discharge: 2023-01-28 | Disposition: A | Discharge status: Home / Self Care | Payer: 59 | Source: Ambulatory Visit | Attending: Nurse Practitioner | Admitting: Nurse Practitioner

## 2023-01-28 DIAGNOSIS — R911 Solitary pulmonary nodule: Secondary | ICD-10-CM | POA: Diagnosis present

## 2023-01-28 DIAGNOSIS — J439 Emphysema, unspecified: Secondary | ICD-10-CM | POA: Insufficient documentation

## 2023-01-28 MED ORDER — IOHEXOL 300 MG/ML  SOLN
75.0000 mL | Freq: Once | INTRAMUSCULAR | Status: AC | PRN
  Administered 2023-01-28: 75 mL via INTRAVENOUS

## 2023-02-04 ENCOUNTER — Other Ambulatory Visit: Payer: Self-pay | Admitting: Nurse Practitioner

## 2023-02-04 MED ORDER — ATORVASTATIN CALCIUM 40 MG PO TABS
40.0000 mg | ORAL_TABLET | Freq: Every day | ORAL | 3 refills | Status: DC
  Filled 2023-02-04: qty 90, 90d supply, fill #0
  Filled 2023-05-25: qty 90, 90d supply, fill #1
  Filled 2023-09-11 – 2023-09-25 (×2): qty 90, 90d supply, fill #2
  Filled 2024-01-15: qty 90, 90d supply, fill #3

## 2023-02-06 ENCOUNTER — Other Ambulatory Visit: Payer: Self-pay

## 2023-02-09 ENCOUNTER — Other Ambulatory Visit: Payer: Self-pay

## 2023-02-14 ENCOUNTER — Ambulatory Visit: Payer: Self-pay | Admitting: Nurse Practitioner

## 2023-02-21 ENCOUNTER — Ambulatory Visit (INDEPENDENT_AMBULATORY_CARE_PROVIDER_SITE_OTHER): Payer: 59 | Admitting: Pulmonary Disease

## 2023-02-21 ENCOUNTER — Encounter (HOSPITAL_BASED_OUTPATIENT_CLINIC_OR_DEPARTMENT_OTHER): Payer: Self-pay | Admitting: Pulmonary Disease

## 2023-02-21 VITALS — BP 124/64 | HR 95 | Resp 24 | Ht 73.0 in | Wt 217.0 lb

## 2023-02-21 DIAGNOSIS — Z716 Tobacco abuse counseling: Secondary | ICD-10-CM

## 2023-02-21 DIAGNOSIS — J432 Centrilobular emphysema: Secondary | ICD-10-CM | POA: Diagnosis not present

## 2023-02-21 DIAGNOSIS — Z01811 Encounter for preprocedural respiratory examination: Secondary | ICD-10-CM | POA: Diagnosis not present

## 2023-02-21 DIAGNOSIS — R911 Solitary pulmonary nodule: Secondary | ICD-10-CM | POA: Diagnosis not present

## 2023-02-21 NOTE — Progress Notes (Signed)
[facility]  Chief Complaint  Patient presents with   New Patient (Initial Visit)    Having sob , new patient  for pulmonary emphysema  having shortness of breath when walking     Past Surgical History:  He  has a past surgical history that includes Intramedullary (im) nail intertrochanteric (Right, 11/11/2014); Rod right leg; Hernia repair; RIGHT/LEFT HEART CATH AND CORONARY ANGIOGRAPHY (N/A, 11/11/2021); Esophagogastroduodenoscopy (egd) with propofol (N/A, 12/10/2021); biopsy (12/10/2021); Hemostasis clip placement (12/10/2021); Esophagogastroduodenoscopy (egd) with propofol (N/A, 11/24/2022); Colonoscopy with propofol (N/A, 11/24/2022); biopsy (11/24/2022); and polypectomy (11/24/2022).  Past Medical History:  HFrEF, ETOH, HTN, PAF  Constitutional:  BP 124/64   Pulse 95   Resp (!) 24   Ht 6\' 1"  (1.854 m)   Wt 217 lb (98.4 kg)   SpO2 94%   BMI 28.63 kg/m   Brief Summary:  Luis Marquez is a 62 y.o. male smoker with emphysema.      Subjective:   He is from [place], [place] but has lived in [place] for years.  He worked in a tobacco factory for about 20 years.  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  Also worked driving a Chief Executive Officer.  Had to stop this due to hip pain.  He had pneumonia about 40 years ago.  Had COVID in 2020, but didn't have to go to the hospital.  He now only smokes when he is driving.  He thinks his aunt had emphysema.  He gets winded when walking up stairs or a hill.  He can't carry his groceries.  It takes him about 3 to 5 minutes to catch his breath.  Not having cough, wheeze, or sputum.  Uses symbicort and this helps.  Doesn't need to use albuterol much.  Sleeping okay.  Not having chest pain or leg swelling.  He might need right hip replacement due to persistent right hip pain.  Physical Exam:   Appearance - well kempt   ENMT - no sinus tenderness, no oral exudate, no LAN, Mallampati 3 airway, no stridor,  edentulous  Respiratory - equal breath sounds bilaterally, no wheezing or rales  CV - s1s2 regular rate and rhythm, no murmurs  Ext - no clubbing, no edema  Skin - no rashes  Psych - normal mood and affect   Pulmonary testing:    Chest Imaging:  CT chest 02/03/23 >> emphysema, apical scarring, 6 mm nodule LLL  Sleep Tests:    Cardiac Tests:  Echo 11/04/21 >> EF 20 to 25%  Social History:  He  reports that he has been smoking cigarettes. He has a 12.5 pack-year smoking history. He has never been exposed to tobacco smoke. He has never used smokeless tobacco. He reports current alcohol use. He reports that he does not currently use drugs.  Family History:  His family history includes Diabetes in his mother.     Assessment/Plan:   Pulmonary emphysema with presumed COPD. - continue symbicort - prn albuterol - will arrange for pulmonary function test  Tobacco abuse. - spent 7 minutes discussing smoking cessation options - he will try to gradually quit  Lung nodule. - will need follow up CT chest in July 2025  HFrEF, PAF. - followed by Dr. Nicholes Mango with CHF team  Right hip pain. - he is working with Dr. Lajoyce Corners with orthopedics - no pulmonary restrictions for him to have surgery  Time Spent Involved in Patient Care on Day of Examination:  51 minutes  Follow up:   Patient Instructions  Will arrange for pulmonary function test  Follow up in 4 months  Medication List:   Allergies as of 02/21/2023   No Known Allergies      Medication List        Accurate as of February 21, 2023  3:30 PM. If you have any questions, ask your nurse or doctor.          atorvastatin 40 MG tablet Commonly known as: LIPITOR Take 1 tablet (40 mg total) by mouth daily.   B-Complex Balanced Tabs Take 1 tablet by mouth daily.   budesonide-formoterol 160-4.5 MCG/ACT inhaler Commonly known as: SYMBICORT Inhale 2 puffs into the lungs 2 (two) times daily.   Eliquis 5  MG Tabs tablet Generic drug: apixaban Take 1 tablet (5 mg total) by mouth 2 (two) times daily.   losartan 100 MG tablet Commonly known as: COZAAR Take 1 tablet (100 mg total) by mouth daily.   metoprolol succinate 25 MG 24 hr tablet Commonly known as: TOPROL-XL Take 1 tablet (25 mg total) by mouth daily.   multivitamin tablet Take 1 tablet by mouth daily.   Omega-3 Fish Oil 1200 MG Caps Take 1 capsule by mouth daily.   OSTEO BI-FLEX TRIPLE STRENGTH PO Take 1 mg by mouth 2 (two) times daily.   pantoprazole 40 MG tablet Commonly known as: PROTONIX Take 1 tablet (40 mg total) by mouth 2 (two) times daily.   Plenvu 140 g Solr Generic drug: PEG-KCl-NaCl-NaSulf-Na Asc-C Take 1 kit by mouth as directed. Use coupon: BIN: [id number] PNC: CNRX Group: [id number] ID: [id number]   potassium chloride SA 20 MEQ tablet Commonly known as: KLOR-CON M Take 2 tablets (40 mEq total) by mouth daily at 12 noon for 1 day, THEN 1 tablet (20 mEq total) daily at 12 noon. Start taking on: October 10, 2022   senna-docusate 8.6-50 MG tablet Commonly known as: Senokot-S Take 1 tablet by mouth daily. Stool softener   tetrahydrozoline 0.05 % ophthalmic solution Place 2 drops into both eyes daily.   Ventolin HFA 108 (90 Base) MCG/ACT inhaler Generic drug: albuterol Inhale 2 puffs into the lungs every 6 (six) hours as needed for wheezing or shortness of breath.        Signature:  Coralyn Helling, MD [facility] Pulmonary/Critical Care Pager - [phone] 02/21/2023, 3:30 PM

## 2023-02-21 NOTE — Patient Instructions (Signed)
Will arrange for pulmonary function test  Follow up in 4 months

## 2023-02-27 ENCOUNTER — Other Ambulatory Visit: Payer: Self-pay

## 2023-02-28 ENCOUNTER — Other Ambulatory Visit: Payer: Self-pay

## 2023-03-01 ENCOUNTER — Other Ambulatory Visit: Payer: Self-pay

## 2023-03-12 NOTE — Progress Notes (Unsigned)
  Cardiology Office Note:  .   Date:  03/13/2023  ID:  Luis Marquez, DOB [birth year], MRN [id number] PCP: Claiborne Rigg, NP  [facility] Providers Cardiologist:  Raeanne Gathers,    History of Present Illness: Luis Marquez   Luis Marquez is a 62 y.o. male pt of Luis Marquez and Luis Marquez. Was seen for CHF / Takotsubo CM in May   Needs to have a hip replacement ( Luis Marquez )  .   CMR shows slight improvement in his EF at 42%.     Chronically short of breath Has PAF  Is on eliquis   Used to handyman work , maintenance work   Will need an echo prior to getting clearance for surgery        ROS:    Studies Reviewed: Luis Marquez   EKG Interpretation Date/Time:  Monday March 13 2023 15:49:56 EDT Ventricular Rate:  101 PR Interval:  184 QRS Duration:  118 QT Interval:  394 QTC Calculation: 510 R Axis:   36  Text Interpretation: Sinus tachycardia with Premature atrial complexes Incomplete right bundle branch block Nonspecific T wave abnormality When compared with ECG of 22-Nov-2022 10:43, PREVIOUS ECG IS PRESENT Confirmed by Kristeen Miss ([id number]) on 03/13/2023 4:04:05 PM      Risk Assessment/Calculations:    CHA2DS2-VASc Score = 2  This indicates a 2.2% annual risk of stroke. The patient's score is based upon: CHF History: 1 HTN History: 1 Diabetes History: 0 Stroke History: 0 Vascular Disease History: 0 Age Score: 0 Gender Score: 0         Physical Exam:   VS:  BP (!) 100/52   Pulse (!) 101   Ht 6\' 1"  (1.854 m)   Wt 210 lb 6.4 oz (95.4 kg)   SpO2 97%   BMI 27.76 kg/m    Wt Readings from Last 3 Encounters:  03/13/23 210 lb 6.4 oz (95.4 kg)  02/21/23 217 lb (98.4 kg)  12/27/22 214 lb 12.8 oz (97.4 kg)    GEN: Well nourished, well developed in no acute distress NECK: No JVD; No carotid bruits CARDIAC:   RR  RESPIRATORY:  Clear to auscultation without rales, wheezing or rhonchi  ABDOMEN: Soft, non-tender, non-distended EXTREMITIES:  No edema; No deformity    ASSESSMENT AND PLAN: .   1.  Takotsubo syndrome: Luis Marquez seems to be doing better.  He was admitted with heart failure and was found to have Takotsubo syndrome.  His LVEF has improved on losartan.  He still remains mildly tachycardic.  I will repeat his echocardiogram.  He needs to have hip surgery.  I would like to see what his LVEF is before we give the okay for hip surgery.  Will have him see an APP in 6 months for follow-up visit.  Continue current medications.  Will have him follow up with Dr. Anne Marquez upon my retirement        Dispo: 6 months   Signed, Kristeen Miss, MD

## 2023-03-13 ENCOUNTER — Ambulatory Visit: Payer: 59 | Attending: Cardiovascular Disease | Admitting: Cardiovascular Disease

## 2023-03-13 ENCOUNTER — Encounter: Payer: Self-pay | Admitting: Cardiovascular Disease

## 2023-03-13 VITALS — BP 100/52 | HR 101 | Ht 73.0 in | Wt 210.4 lb

## 2023-03-13 DIAGNOSIS — I5181 Takotsubo syndrome: Secondary | ICD-10-CM

## 2023-03-13 DIAGNOSIS — I5022 Chronic systolic (congestive) heart failure: Secondary | ICD-10-CM

## 2023-03-13 NOTE — Patient Instructions (Signed)
Medication Instructions:   Your physician recommends that you continue on your current medications as directed. Please refer to the Current Medication list given to you today.  *If you need a refill on your cardiac medications before your next appointment, please call your pharmacy*     Testing/Procedures:  Your physician has requested that you have an echocardiogram. Echocardiography is a painless test that uses sound waves to create images of your heart. It provides your doctor with information about the size and shape of your heart and how well your heart's chambers and valves are working. This procedure takes approximately one hour. There are no restrictions for this procedure. Please do NOT wear cologne, perfume, aftershave, or lotions (deodorant is allowed). Please arrive 15 minutes prior to your appointment time.    Follow-Up: At [facility], you and your health needs are our priority.  As part of our continuing mission to provide you with exceptional heart care, we have created designated Provider Care Teams.  These Care Teams include your primary Cardiologist (physician) and Advanced Practice Providers (APPs -  Physician Assistants and Nurse Practitioners) who all work together to provide you with the care you need, when you need it.  We recommend signing up for the patient portal called "MyChart".  Sign up information is provided on this After Visit Summary.  MyChart is used to connect with patients for Virtual Visits (Telemedicine).  Patients are able to view lab/test results, encounter notes, upcoming appointments, etc.  Non-urgent messages can be sent to your provider as well.   To learn more about what you can do with MyChart, go to [email or web address].    Your next appointment:   6 month(s)  Provider:   Jari Favre, PA-C, Eligha Bridegroom, NP, or Tereso Newcomer, PA-C

## 2023-03-21 ENCOUNTER — Encounter: Payer: Self-pay | Admitting: Nurse Practitioner

## 2023-03-21 ENCOUNTER — Other Ambulatory Visit: Payer: Self-pay

## 2023-03-21 ENCOUNTER — Ambulatory Visit: Payer: 59 | Attending: Nurse Practitioner | Admitting: Nurse Practitioner

## 2023-03-21 VITALS — BP 97/65 | HR 117 | Ht 73.0 in | Wt 207.4 lb

## 2023-03-21 DIAGNOSIS — R197 Diarrhea, unspecified: Secondary | ICD-10-CM

## 2023-03-21 DIAGNOSIS — K625 Hemorrhage of anus and rectum: Secondary | ICD-10-CM

## 2023-03-21 DIAGNOSIS — D649 Anemia, unspecified: Secondary | ICD-10-CM

## 2023-03-21 DIAGNOSIS — I10 Essential (primary) hypertension: Secondary | ICD-10-CM

## 2023-03-21 MED ORDER — LOPERAMIDE HCL 2 MG PO TABS
2.0000 mg | ORAL_TABLET | Freq: Four times a day (QID) | ORAL | 0 refills | Status: DC | PRN
  Filled 2023-03-21: qty 24, 6d supply, fill #0

## 2023-03-21 NOTE — Patient Instructions (Signed)
[facility] Located in: [place] [address] Address: [address], [place], [place] [address]

## 2023-03-21 NOTE — Progress Notes (Signed)
Assessment & Plan:  Luis Marquez was seen today for medical management of chronic issues, tremors and diarrhea.  Diagnoses and all orders for this visit:  Primary hypertension -     CMP14+EGFR  Diarrhea in adult patient -     Giardia, EIA; Ova/Parasite -     loperamide (IMODIUM A-D) 2 MG tablet; Take 1 tablet (2 mg total) by mouth 4 (four) times daily as needed for diarrhea or loose stools. -     Fecal occult blood, imunochemical([facility])  Anemia, unspecified type -     CBC with Differential  Rectal bleeding -     Fecal occult blood, imunochemical([facility])    Patient has been counseled on age-appropriate routine health concerns for screening and prevention. These are reviewed and up-to-date. Referrals have been placed accordingly. Immunizations are up-to-date or declined.    Subjective:   Chief Complaint  Patient presents with   Medical Management of Chronic Issues   Tremors    Tremors in both hands started 3 weeks ago.    Diarrhea    Started 3 weeks ago    Luis Marquez 62 y.o. male presents to office today for follow up to HTN and with complaints of diarrhea and hand tremors.   HTN Blood pressure is low normal.  He is asymptomatic.  Endorses adherence taking losartan 100 mg daily and Toprol-XL 25 mg daily. BP Readings from Last 3 Encounters:  03/21/23 97/65  03/13/23 (!) 100/52  02/21/23 124/64    Diarrhea: Patient complains of diarrhea. Onset of diarrhea was several weeks ago. Diarrhea is occurring approximately several times per day. Patient describes diarrhea as bloody and watery. Diarrhea has been associated with abdominal pain described as cramping .  Patient denies fever, illness in household contacts, recent antibiotic use, recent camping, recent travel.  Previous visits for diarrhea: none. Evaluation to date: none. Treatment to date: imodium He also notes blood in stool has resolved however there is blood present when he wipes. He has a history of  internal hemorrhoids. He was previously on senna however he stopped this several weeks ago when the diarrhea started. He does drink tap water but states this is not new for him. Denies N/V. Still drinking alcohol but has cut back and last drink was over one week ago.    Tremor: He complains of tremor. Tremor primarily involves the  bilateral hands .  Onset of symptoms was  3 weeks ago . Symptoms are currently of moderate severity. Tremor exacerbated by unsure. Tremor is alleviated by nothing. Symptoms occur intermittently and last hours. He also has a history of alcohol abuse.     Review of Systems  Constitutional:  Negative for fever, malaise/fatigue and weight loss.  HENT: Negative.  Negative for nosebleeds.   Eyes: Negative.  Negative for blurred vision, double vision and photophobia.  Respiratory: Negative.  Negative for cough and shortness of breath.   Cardiovascular: Negative.  Negative for chest pain, palpitations and leg swelling.  Gastrointestinal:  Positive for diarrhea. Negative for heartburn, nausea and vomiting.  Musculoskeletal: Negative.  Negative for myalgias.  Neurological:  Positive for tremors. Negative for dizziness, focal weakness, seizures and headaches.  Psychiatric/Behavioral: Negative.  Negative for suicidal ideas.     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   Emphysema, unspecified (HCC)    ETOH abuse    Fracture, intertrochanteric, right femur (HCC) 11/11/2014   Hypertension    Hypomagnesemia  Laceration of head 11/03/2021   Paroxysmal A-fib (HCC)    Syncope 12/07/2021   Tobacco abuse     Past Surgical History:  Procedure Laterality Date   BIOPSY  12/10/2021   Procedure: BIOPSY;  Surgeon: Napoleon Form, MD;  Location: [facility] ENDOSCOPY;  Service: Gastroenterology;;   BIOPSY  11/24/2022   Procedure: BIOPSY;  Surgeon: Beverley Fiedler, MD;  Location: [facility] ENDOSCOPY;  Service:  Gastroenterology;;   COLONOSCOPY WITH PROPOFOL N/A 11/24/2022   Procedure: COLONOSCOPY WITH PROPOFOL;  Surgeon: Beverley Fiedler, MD;  Location: [facility] ENDOSCOPY;  Service: Gastroenterology;  Laterality: N/A;   ESOPHAGOGASTRODUODENOSCOPY (EGD) WITH PROPOFOL N/A 12/10/2021   Procedure: ESOPHAGOGASTRODUODENOSCOPY (EGD) WITH PROPOFOL;  Surgeon: Napoleon Form, MD;  Location: [facility] ENDOSCOPY;  Service: Gastroenterology;  Laterality: N/A;   ESOPHAGOGASTRODUODENOSCOPY (EGD) WITH PROPOFOL N/A 11/24/2022   Procedure: ESOPHAGOGASTRODUODENOSCOPY (EGD) WITH PROPOFOL;  Surgeon: Beverley Fiedler, MD;  Location: [facility] ENDOSCOPY;  Service: Gastroenterology;  Laterality: N/A;   HEMOSTASIS CLIP PLACEMENT  12/10/2021   Procedure: HEMOSTASIS CLIP PLACEMENT;  Surgeon: Napoleon Form, MD;  Location: [facility] ENDOSCOPY;  Service: Gastroenterology;;   HERNIA REPAIR     INTRAMEDULLARY (IM) NAIL INTERTROCHANTERIC Right 11/11/2014   Procedure: INTRAMEDULLARY (IM) NAIL INTERTROCHANTRIC;  Surgeon: Durene Romans, MD;  Location: [facility] ORS;  Service: Orthopedics;  Laterality: Right;   POLYPECTOMY  11/24/2022   Procedure: POLYPECTOMY;  Surgeon: Beverley Fiedler, MD;  Location: [facility] ENDOSCOPY;  Service: Gastroenterology;;   RIGHT/LEFT HEART CATH AND CORONARY ANGIOGRAPHY N/A 11/11/2021   Procedure: RIGHT/LEFT HEART CATH AND CORONARY ANGIOGRAPHY;  Surgeon: Dolores Patty, MD;  Location: [facility] INVASIVE CV LAB;  Service: Cardiovascular;  Laterality: N/A;   Rod right leg      Family History  Problem Relation Age of Onset   Diabetes Mother    Colon cancer Neg Hx    Stomach cancer Neg Hx    Esophageal cancer Neg Hx     Social History Reviewed with no changes to be made today.   Outpatient Medications Prior to Visit  Medication Sig Dispense Refill   albuterol (VENTOLIN HFA) 108 (90 Base) MCG/ACT inhaler Inhale 2 puffs into the lungs every 6 (six) hours as needed for wheezing or shortness of breath. 18 g 3   apixaban (ELIQUIS) 5 MG TABS tablet Take 1  tablet (5 mg total) by mouth 2 (two) times daily. 180 tablet 1   atorvastatin (LIPITOR) 40 MG tablet Take 1 tablet (40 mg total) by mouth daily. 90 tablet 3   B Complex-C-Folic Acid (B-COMPLEX BALANCED) TABS Take 1 tablet by mouth daily.     budesonide-formoterol (SYMBICORT) 160-4.5 MCG/ACT inhaler Inhale 2 puffs into the lungs 2 (two) times daily. 10.2 g 3   losartan (COZAAR) 100 MG tablet Take 1 tablet (100 mg total) by mouth daily. 90 tablet 3   metoprolol succinate (TOPROL-XL) 25 MG 24 hr tablet Take 1 tablet (25 mg total) by mouth daily. 90 tablet 3   Misc Natural Products (OSTEO BI-FLEX TRIPLE STRENGTH PO) Take 1 mg by mouth 2 (two) times daily.     Multiple Vitamin (MULTIVITAMIN) tablet Take 1 tablet by mouth daily.     Omega-3 Fatty Acids (OMEGA-3 FISH OIL) 1200 MG CAPS Take 1 capsule by mouth daily.     pantoprazole (PROTONIX) 40 MG tablet Take 1 tablet (40 mg total) by mouth 2 (two) times daily. 180 tablet 2   PEG-KCl-NaCl-NaSulf-Na Asc-C (PLENVU) 140 g SOLR Take 1 kit by mouth as directed. Use coupon: BIN:  [id number] PNC: CNRX Group: [id number] ID: [id number] 3 each 0   potassium chloride SA (KLOR-CON M) 20 MEQ tablet Take 2 tablets (40 mEq total) by mouth daily at 12 noon for 1 day, THEN 1 tablet (20 mEq total) daily at 12 noon. 31 tablet 6   tetrahydrozoline 0.05 % ophthalmic solution Place 2 drops into both eyes daily.     senna-docusate (SENOKOT-S) 8.6-50 MG tablet Take 1 tablet by mouth daily. Stool softener 90 tablet 1   No facility-administered medications prior to visit.    No Known Allergies     Objective:    BP 97/65 (BP Location: Left Arm, Patient Position: Sitting, Cuff Size: Normal)   Pulse (!) 117   Ht 6\' 1"  (1.854 m)   Wt 207 lb 6.4 oz (94.1 kg)   SpO2 97%   BMI 27.36 kg/m  Wt Readings from Last 3 Encounters:  03/21/23 207 lb 6.4 oz (94.1 kg)  03/13/23 210 lb 6.4 oz (95.4 kg)  02/21/23 217 lb (98.4 kg)    Physical Exam Vitals and nursing note reviewed.   Constitutional:      Appearance: He is well-developed.  HENT:     Head: Normocephalic and atraumatic.  Cardiovascular:     Rate and Rhythm: Regular rhythm. Tachycardia present.     Heart sounds: Normal heart sounds. No murmur heard.    No friction rub. No gallop.  Pulmonary:     Effort: Pulmonary effort is normal. No tachypnea or respiratory distress.     Breath sounds: Normal breath sounds. No decreased breath sounds, wheezing, rhonchi or rales.  Chest:     Chest wall: No tenderness.  Abdominal:     General: Bowel sounds are normal.     Palpations: Abdomen is soft.  Musculoskeletal:        General: Normal range of motion.     Cervical back: Normal range of motion.  Skin:    General: Skin is warm and dry.  Neurological:     Mental Status: He is alert and oriented to person, place, and time.     Coordination: Coordination normal.  Psychiatric:        Behavior: Behavior normal. Behavior is cooperative.        Thought Content: Thought content normal.        Judgment: Judgment normal.          Patient has been counseled extensively about nutrition and exercise as well as the importance of adherence with medications and regular follow-up. The patient was given clear instructions to go to ER or return to medical center if symptoms don't improve, worsen or new problems develop. The patient verbalized understanding.   Follow-up: Return in about 4 weeks (around 04/18/2023).   Claiborne Rigg, FNP-BC [facility] [place], [place] [phone]   03/21/2023, 3:15 PM

## 2023-03-22 LAB — CBC WITH DIFFERENTIAL/PLATELET
Basophils Absolute: 0.1 10*3/uL (ref 0.0–0.2)
Basos: 1 %
EOS (ABSOLUTE): 0.7 10*3/uL — ABNORMAL HIGH (ref 0.0–0.4)
Eos: 6 %
Hematocrit: 35.8 % — ABNORMAL LOW (ref 37.5–51.0)
Hemoglobin: 11.5 g/dL — ABNORMAL LOW (ref 13.0–17.7)
Immature Grans (Abs): 0.1 10*3/uL (ref 0.0–0.1)
Immature Granulocytes: 1 %
Lymphocytes Absolute: 1.3 10*3/uL (ref 0.7–3.1)
Lymphs: 11 %
MCH: 30.7 pg (ref 26.6–33.0)
MCHC: 32.1 g/dL (ref 31.5–35.7)
MCV: 96 fL (ref 79–97)
Monocytes Absolute: 0.6 10*3/uL (ref 0.1–0.9)
Monocytes: 5 %
Neutrophils Absolute: 9.1 10*3/uL — ABNORMAL HIGH (ref 1.4–7.0)
Neutrophils: 76 %
Platelets: 368 10*3/uL (ref 150–450)
RBC: 3.75 x10E6/uL — ABNORMAL LOW (ref 4.14–5.80)
RDW: 12 % (ref 11.6–15.4)
WBC: 11.9 10*3/uL — ABNORMAL HIGH (ref 3.4–10.8)

## 2023-03-22 LAB — CMP14+EGFR
ALT: 22 IU/L (ref 0–44)
AST: 28 IU/L (ref 0–40)
Albumin: 3.6 g/dL — ABNORMAL LOW (ref 3.9–4.9)
Alkaline Phosphatase: 147 IU/L — ABNORMAL HIGH (ref 44–121)
BUN/Creatinine Ratio: 9 — ABNORMAL LOW (ref 10–24)
BUN: 28 mg/dL — ABNORMAL HIGH (ref 8–27)
Bilirubin Total: 0.3 mg/dL (ref 0.0–1.2)
CO2: 19 mmol/L — ABNORMAL LOW (ref 20–29)
Calcium: 9 mg/dL (ref 8.6–10.2)
Chloride: 103 mmol/L (ref 96–106)
Creatinine, Ser: 3.16 mg/dL — ABNORMAL HIGH (ref 0.76–1.27)
Globulin, Total: 3.1 g/dL (ref 1.5–4.5)
Glucose: 105 mg/dL — ABNORMAL HIGH (ref 70–99)
Potassium: 5.1 mmol/L (ref 3.5–5.2)
Sodium: 141 mmol/L (ref 134–144)
Total Protein: 6.7 g/dL (ref 6.0–8.5)
eGFR: 21 mL/min/{1.73_m2} — ABNORMAL LOW (ref >59)

## 2023-04-03 ENCOUNTER — Ambulatory Visit (HOSPITAL_COMMUNITY): Payer: 59 | Attending: Cardiovascular Disease

## 2023-04-06 ENCOUNTER — Inpatient Hospital Stay (HOSPITAL_COMMUNITY): Payer: 59

## 2023-04-06 ENCOUNTER — Encounter (HOSPITAL_COMMUNITY): Payer: Self-pay | Admitting: Emergency Medicine

## 2023-04-06 ENCOUNTER — Emergency Department (HOSPITAL_COMMUNITY): Payer: 59

## 2023-04-06 ENCOUNTER — Other Ambulatory Visit: Payer: Self-pay

## 2023-04-06 ENCOUNTER — Inpatient Hospital Stay (HOSPITAL_COMMUNITY)
Admission: EM | Admit: 2023-04-06 | Discharge: 2023-04-24 | DRG: 439 | Disposition: A | Discharge status: Home / Self Care | Payer: 59 | Attending: Internal Medicine | Admitting: Internal Medicine

## 2023-04-06 DIAGNOSIS — F17210 Nicotine dependence, cigarettes, uncomplicated: Secondary | ICD-10-CM | POA: Diagnosis present

## 2023-04-06 DIAGNOSIS — K921 Melena: Secondary | ICD-10-CM | POA: Diagnosis present

## 2023-04-06 DIAGNOSIS — K5900 Constipation, unspecified: Secondary | ICD-10-CM | POA: Diagnosis present

## 2023-04-06 DIAGNOSIS — I10 Essential (primary) hypertension: Secondary | ICD-10-CM | POA: Diagnosis not present

## 2023-04-06 DIAGNOSIS — F1010 Alcohol abuse, uncomplicated: Secondary | ICD-10-CM | POA: Diagnosis present

## 2023-04-06 DIAGNOSIS — E871 Hypo-osmolality and hyponatremia: Secondary | ICD-10-CM | POA: Diagnosis present

## 2023-04-06 DIAGNOSIS — J439 Emphysema, unspecified: Secondary | ICD-10-CM | POA: Diagnosis present

## 2023-04-06 DIAGNOSIS — N179 Acute kidney failure, unspecified: Secondary | ICD-10-CM | POA: Diagnosis present

## 2023-04-06 DIAGNOSIS — Z7951 Long term (current) use of inhaled steroids: Secondary | ICD-10-CM

## 2023-04-06 DIAGNOSIS — I714 Abdominal aortic aneurysm, without rupture, unspecified: Secondary | ICD-10-CM | POA: Diagnosis present

## 2023-04-06 DIAGNOSIS — D62 Acute posthemorrhagic anemia: Secondary | ICD-10-CM | POA: Diagnosis present

## 2023-04-06 DIAGNOSIS — I959 Hypotension, unspecified: Secondary | ICD-10-CM | POA: Diagnosis present

## 2023-04-06 DIAGNOSIS — K5730 Diverticulosis of large intestine without perforation or abscess without bleeding: Secondary | ICD-10-CM | POA: Diagnosis present

## 2023-04-06 DIAGNOSIS — E8809 Other disorders of plasma-protein metabolism, not elsewhere classified: Secondary | ICD-10-CM | POA: Diagnosis present

## 2023-04-06 DIAGNOSIS — K852 Alcohol induced acute pancreatitis without necrosis or infection: Principal | ICD-10-CM | POA: Diagnosis present

## 2023-04-06 DIAGNOSIS — K859 Acute pancreatitis without necrosis or infection, unspecified: Secondary | ICD-10-CM | POA: Diagnosis not present

## 2023-04-06 DIAGNOSIS — I480 Paroxysmal atrial fibrillation: Secondary | ICD-10-CM | POA: Diagnosis present

## 2023-04-06 DIAGNOSIS — R000 Tachycardia, unspecified: Secondary | ICD-10-CM | POA: Diagnosis not present

## 2023-04-06 DIAGNOSIS — I5022 Chronic systolic (congestive) heart failure: Secondary | ICD-10-CM | POA: Diagnosis present

## 2023-04-06 DIAGNOSIS — D649 Anemia, unspecified: Secondary | ICD-10-CM | POA: Diagnosis not present

## 2023-04-06 DIAGNOSIS — I110 Hypertensive heart disease with heart failure: Secondary | ICD-10-CM | POA: Diagnosis present

## 2023-04-06 DIAGNOSIS — Z6830 Body mass index (BMI) 30.0-30.9, adult: Secondary | ICD-10-CM

## 2023-04-06 DIAGNOSIS — E669 Obesity, unspecified: Secondary | ICD-10-CM | POA: Diagnosis present

## 2023-04-06 DIAGNOSIS — R1013 Epigastric pain: Secondary | ICD-10-CM | POA: Diagnosis present

## 2023-04-06 DIAGNOSIS — I700 Atherosclerosis of aorta: Secondary | ICD-10-CM | POA: Diagnosis present

## 2023-04-06 DIAGNOSIS — Z860100 Personal history of colon polyps, unspecified: Secondary | ICD-10-CM | POA: Diagnosis not present

## 2023-04-06 DIAGNOSIS — I5021 Acute systolic (congestive) heart failure: Secondary | ICD-10-CM | POA: Diagnosis not present

## 2023-04-06 DIAGNOSIS — Z79899 Other long term (current) drug therapy: Secondary | ICD-10-CM

## 2023-04-06 DIAGNOSIS — K7460 Unspecified cirrhosis of liver: Secondary | ICD-10-CM | POA: Diagnosis present

## 2023-04-06 DIAGNOSIS — E875 Hyperkalemia: Secondary | ICD-10-CM | POA: Diagnosis present

## 2023-04-06 DIAGNOSIS — Z7901 Long term (current) use of anticoagulants: Secondary | ICD-10-CM

## 2023-04-06 DIAGNOSIS — I2510 Atherosclerotic heart disease of native coronary artery without angina pectoris: Secondary | ICD-10-CM | POA: Diagnosis present

## 2023-04-06 LAB — COMPREHENSIVE METABOLIC PANEL
ALT: 21 U/L (ref 0–44)
AST: 34 U/L (ref 15–41)
Albumin: 2.5 g/dL — ABNORMAL LOW (ref 3.5–5.0)
Alkaline Phosphatase: 129 U/L — ABNORMAL HIGH (ref 38–126)
Anion gap: 14 (ref 5–15)
BUN: 29 mg/dL — ABNORMAL HIGH (ref 8–23)
CO2: 19 mmol/L — ABNORMAL LOW (ref 22–32)
Calcium: 8.4 mg/dL — ABNORMAL LOW (ref 8.9–10.3)
Chloride: 103 mmol/L (ref 98–111)
Creatinine, Ser: 1.64 mg/dL — ABNORMAL HIGH (ref 0.61–1.24)
GFR, Estimated: 46 mL/min — ABNORMAL LOW (ref >60)
Glucose, Bld: 132 mg/dL — ABNORMAL HIGH (ref 70–99)
Potassium: 5.4 mmol/L — ABNORMAL HIGH (ref 3.5–5.1)
Sodium: 136 mmol/L (ref 135–145)
Total Bilirubin: 1 mg/dL (ref 0.3–1.2)
Total Protein: 6.4 g/dL — ABNORMAL LOW (ref 6.5–8.1)

## 2023-04-06 LAB — CBC WITH DIFFERENTIAL/PLATELET
Abs Immature Granulocytes: 0.07 10*3/uL (ref 0.00–0.07)
Basophils Absolute: 0 10*3/uL (ref 0.0–0.1)
Basophils Relative: 0 %
Eosinophils Absolute: 0 10*3/uL (ref 0.0–0.5)
Eosinophils Relative: 0 %
HCT: 34.3 % — ABNORMAL LOW (ref 39.0–52.0)
Hemoglobin: 11.2 g/dL — ABNORMAL LOW (ref 13.0–17.0)
Immature Granulocytes: 1 %
Lymphocytes Relative: 5 %
Lymphs Abs: 0.7 10*3/uL (ref 0.7–4.0)
MCH: 29.9 pg (ref 26.0–34.0)
MCHC: 32.7 g/dL (ref 30.0–36.0)
MCV: 91.5 fL (ref 80.0–100.0)
Monocytes Absolute: 0.6 10*3/uL (ref 0.1–1.0)
Monocytes Relative: 4 %
Neutro Abs: 13.8 10*3/uL — ABNORMAL HIGH (ref 1.7–7.7)
Neutrophils Relative %: 90 %
Platelets: 375 10*3/uL (ref 150–400)
RBC: 3.75 MIL/uL — ABNORMAL LOW (ref 4.22–5.81)
RDW: 13.2 % (ref 11.5–15.5)
WBC: 15.2 10*3/uL — ABNORMAL HIGH (ref 4.0–10.5)
nRBC: 0 % (ref 0.0–0.2)

## 2023-04-06 LAB — TYPE AND SCREEN
ABO/RH(D): O POS
Antibody Screen: NEGATIVE

## 2023-04-06 LAB — I-STAT CHEM 8, ED
BUN: 41 mg/dL — ABNORMAL HIGH (ref 8–23)
Calcium, Ion: 1.04 mmol/L — ABNORMAL LOW (ref 1.15–1.40)
Chloride: 106 mmol/L (ref 98–111)
Creatinine, Ser: 1.6 mg/dL — ABNORMAL HIGH (ref 0.61–1.24)
Glucose, Bld: 134 mg/dL — ABNORMAL HIGH (ref 70–99)
HCT: 34 % — ABNORMAL LOW (ref 39.0–52.0)
Hemoglobin: 11.6 g/dL — ABNORMAL LOW (ref 13.0–17.0)
Potassium: 5.9 mmol/L — ABNORMAL HIGH (ref 3.5–5.1)
Sodium: 135 mmol/L (ref 135–145)
TCO2: 21 mmol/L — ABNORMAL LOW (ref 22–32)

## 2023-04-06 LAB — LIPASE, BLOOD: Lipase: 1685 U/L — ABNORMAL HIGH (ref 11–51)

## 2023-04-06 LAB — ETHANOL: Alcohol, Ethyl (B): <10 mg/dL (ref <10)

## 2023-04-06 MED ORDER — PANTOPRAZOLE SODIUM 40 MG IV SOLR
40.0000 mg | Freq: Two times a day (BID) | INTRAVENOUS | Status: DC
  Administered 2023-04-06 – 2023-04-19 (×26): 40 mg via INTRAVENOUS
  Filled 2023-04-06 (×26): qty 10

## 2023-04-06 MED ORDER — HYDROMORPHONE HCL 1 MG/ML IJ SOLN
0.5000 mg | Freq: Once | INTRAMUSCULAR | Status: AC
  Administered 2023-04-06: 0.5 mg via INTRAVENOUS
  Filled 2023-04-06: qty 1

## 2023-04-06 MED ORDER — ONDANSETRON HCL 4 MG PO TABS
4.0000 mg | ORAL_TABLET | Freq: Four times a day (QID) | ORAL | Status: DC | PRN
  Administered 2023-04-13: 4 mg via ORAL
  Filled 2023-04-06: qty 1

## 2023-04-06 MED ORDER — ONDANSETRON HCL 4 MG/2ML IJ SOLN
4.0000 mg | Freq: Four times a day (QID) | INTRAMUSCULAR | Status: DC | PRN
  Administered 2023-04-07 – 2023-04-20 (×4): 4 mg via INTRAVENOUS
  Filled 2023-04-06 (×4): qty 2

## 2023-04-06 MED ORDER — MORPHINE SULFATE (PF) 2 MG/ML IV SOLN
2.0000 mg | INTRAVENOUS | Status: DC | PRN
  Administered 2023-04-06 – 2023-04-12 (×39): 2 mg via INTRAVENOUS
  Filled 2023-04-06 (×40): qty 1

## 2023-04-06 MED ORDER — SODIUM CHLORIDE 0.9 % IV SOLN: INTRAVENOUS | Status: AC

## 2023-04-06 MED ORDER — ONDANSETRON HCL 4 MG/2ML IJ SOLN
4.0000 mg | Freq: Once | INTRAMUSCULAR | Status: AC
  Administered 2023-04-06: 4 mg via INTRAVENOUS
  Filled 2023-04-06: qty 2

## 2023-04-06 MED ORDER — HYDRALAZINE HCL 20 MG/ML IJ SOLN: 10.0000 mg | INTRAMUSCULAR | Status: DC | PRN

## 2023-04-06 MED ORDER — IPRATROPIUM BROMIDE 0.02 % IN SOLN
0.5000 mg | Freq: Four times a day (QID) | RESPIRATORY_TRACT | Status: DC
  Administered 2023-04-06: 0.5 mg via RESPIRATORY_TRACT
  Filled 2023-04-06: qty 2.5

## 2023-04-06 MED ORDER — ALBUTEROL SULFATE (2.5 MG/3ML) 0.083% IN NEBU
2.5000 mg | INHALATION_SOLUTION | Freq: Three times a day (TID) | RESPIRATORY_TRACT | Status: DC
  Administered 2023-04-07: 2.5 mg via RESPIRATORY_TRACT
  Filled 2023-04-06: qty 3

## 2023-04-06 MED ORDER — IOHEXOL 350 MG/ML SOLN
75.0000 mL | Freq: Once | INTRAVENOUS | Status: AC | PRN
  Administered 2023-04-06: 75 mL via INTRAVENOUS

## 2023-04-06 MED ORDER — IPRATROPIUM BROMIDE 0.02 % IN SOLN
0.5000 mg | Freq: Three times a day (TID) | RESPIRATORY_TRACT | Status: DC
  Administered 2023-04-07: 0.5 mg via RESPIRATORY_TRACT
  Filled 2023-04-06: qty 2.5

## 2023-04-06 MED ORDER — ALBUTEROL SULFATE (2.5 MG/3ML) 0.083% IN NEBU
2.5000 mg | INHALATION_SOLUTION | Freq: Four times a day (QID) | RESPIRATORY_TRACT | Status: DC
  Administered 2023-04-06: 2.5 mg via RESPIRATORY_TRACT
  Filled 2023-04-06: qty 3

## 2023-04-06 NOTE — ED Triage Notes (Signed)
Pt from home via [facility]. Pt reports emesis and diarrhea. Denies fevers. Pt also reports bloody stool last week but has since resolved.

## 2023-04-06 NOTE — ED Provider Notes (Signed)
[place] EMERGENCY DEPARTMENT AT [facility] Provider Note   CSN: [id number] Arrival date & time: 04/06/23  1120     History  Chief Complaint  Patient presents with   Emesis    Luis Marquez is a 62 y.o. male.  HPI 62 year old male presents with abdominal pain.  He has multiple comorbidities including alcohol abuse, emphysema, paroxysmal A-fib, systolic heart failure.  He is also had a previous history of GI bleeding and diverticulosis.  He has had on and off abdominal pain for several weeks.  Previously was having diarrhea with blood in the stool and saw his PCP who prescribed Imodium.  Last time he had blood in the stool was a couple weeks ago.  Today he called EMS because of abdominal pain.  Pain originally a couple weeks ago started in his lower abdomen but has moved up and now is both diffuse but primarily in his epigastrium.  He has had vomiting since yesterday and has been unable to keep down any of his meds since yesterday.  No hematemesis.  He has chronic but unchanged dyspnea on exertion and no chest pain.  He denies any leg swelling.  Last bowel movement was about 3 days ago.  Home Medications Prior to Admission medications   Medication Sig Start Date End Date Taking? Authorizing Provider  albuterol (VENTOLIN HFA) 108 (90 Base) MCG/ACT inhaler Inhale 2 puffs into the lungs every 6 (six) hours as needed for wheezing or shortness of breath. 12/27/22   Claiborne Rigg, NP  apixaban (ELIQUIS) 5 MG TABS tablet Take 1 tablet (5 mg total) by mouth 2 (two) times daily. 08/16/22   Claiborne Rigg, NP  atorvastatin (LIPITOR) 40 MG tablet Take 1 tablet (40 mg total) by mouth daily. 02/04/23   Claiborne Rigg, NP  B Complex-C-Folic Acid (B-COMPLEX BALANCED) TABS Take 1 tablet by mouth daily.    [provider]  budesonide-formoterol (SYMBICORT) 160-4.5 MCG/ACT inhaler Inhale 2 puffs into the lungs 2 (two) times daily. 08/16/22   Hoy Register, MD  loperamide (IMODIUM  A-D) 2 MG tablet Take 1 tablet (2 mg total) by mouth 4 (four) times daily as needed for diarrhea or loose stools. 03/21/23   Claiborne Rigg, NP  losartan (COZAAR) 100 MG tablet Take 1 tablet (100 mg total) by mouth daily. 10/25/22   Bensimhon, Bevelyn Buckles, MD  metoprolol succinate (TOPROL-XL) 25 MG 24 hr tablet Take 1 tablet (25 mg total) by mouth daily. 12/27/22   Claiborne Rigg, NP  Misc Natural Products (OSTEO BI-FLEX TRIPLE STRENGTH PO) Take 1 mg by mouth 2 (two) times daily.    [provider]  Multiple Vitamin (MULTIVITAMIN) tablet Take 1 tablet by mouth daily.    [provider]  Omega-3 Fatty Acids (OMEGA-3 FISH OIL) 1200 MG CAPS Take 1 capsule by mouth daily.    [provider]  pantoprazole (PROTONIX) 40 MG tablet Take 1 tablet (40 mg total) by mouth 2 (two) times daily. 08/16/22   Claiborne Rigg, NP  PEG-KCl-NaCl-NaSulf-Na Asc-C (PLENVU) 140 g SOLR Take 1 kit by mouth as directed. Use coupon: BIN: [id number] PNC: CNRX Group: [id number] ID: [id number] 09/20/22   Zehr, Shanda Bumps D, PA-C  potassium chloride SA (KLOR-CON M) 20 MEQ tablet Take 2 tablets (40 mEq total) by mouth daily at 12 noon for 1 day, THEN 1 tablet (20 mEq total) daily at 12 noon. 10/10/22 10/11/23  Jacklynn Ganong, FNP  tetrahydrozoline 0.05 % ophthalmic solution  Place 2 drops into both eyes daily.    [provider]      Allergies    Patient has no known allergies.    Review of Systems   Review of Systems  Constitutional:  Negative for fever.  Respiratory:  Positive for shortness of breath (chronic, on exertion).   Cardiovascular:  Negative for chest pain and leg swelling.  Gastrointestinal:  Positive for abdominal pain, constipation, diarrhea, nausea and vomiting.    Physical Exam Updated Vital Signs BP 107/81   Pulse 93   Temp 98.5 F (36.9 C) (Oral)   Resp 19   SpO2 100%  Physical Exam Vitals and nursing note reviewed.  Constitutional:      Appearance: He is well-developed.   HENT:     Head: Normocephalic and atraumatic.  Cardiovascular:     Rate and Rhythm: Regular rhythm. Tachycardia present.     Heart sounds: Normal heart sounds.     Comments: HR low 100s Pulmonary:     Effort: Pulmonary effort is normal.     Breath sounds: Normal breath sounds.  Abdominal:     Palpations: Abdomen is soft.     Tenderness: There is generalized abdominal tenderness.  Musculoskeletal:     Right lower leg: No edema.     Left lower leg: No edema.  Skin:    General: Skin is warm and dry.  Neurological:     Mental Status: He is alert.     ED Results / Procedures / Treatments   Labs (all labs ordered are listed, but only abnormal results are displayed) Labs Reviewed  COMPREHENSIVE METABOLIC PANEL - Abnormal; Notable for the following components:      Result Value   Potassium 5.4 (*)    CO2 19 (*)    Glucose, Bld 132 (*)    BUN 29 (*)    Creatinine, Ser 1.64 (*)    Calcium 8.4 (*)    Total Protein 6.4 (*)    Albumin 2.5 (*)    Alkaline Phosphatase 129 (*)    GFR, Estimated 46 (*)    All other components within normal limits  CBC WITH DIFFERENTIAL/PLATELET - Abnormal; Notable for the following components:   WBC 15.2 (*)    RBC 3.75 (*)    Hemoglobin 11.2 (*)    HCT 34.3 (*)    Neutro Abs 13.8 (*)    All other components within normal limits  LIPASE, BLOOD - Abnormal; Notable for the following components:   Lipase 1,685 (*)    All other components within normal limits  I-STAT CHEM 8, ED - Abnormal; Notable for the following components:   Potassium 5.9 (*)    BUN 41 (*)    Creatinine, Ser 1.60 (*)    Glucose, Bld 134 (*)    Calcium, Ion 1.04 (*)    TCO2 21 (*)    Hemoglobin 11.6 (*)    HCT 34.0 (*)    All other components within normal limits  ETHANOL  URINALYSIS, W/ REFLEX TO CULTURE (INFECTION SUSPECTED)  TYPE AND SCREEN    EKG EKG Interpretation Date/Time:  Thursday April 06 2023 11:42:47 EDT Ventricular Rate:  101 PR Interval:  162 QRS  Duration:  121 QT Interval:  363 QTC Calculation: 471 R Axis:   50  Text Interpretation: Sinus tachycardia Right bundle branch block chronic ST changes similar to Aug 2024 Confirmed by Pricilla Loveless [phone]) on 04/06/2023 12:04:53 PM  Radiology CT ABDOMEN PELVIS W CONTRAST  Result Date: 04/06/2023  CLINICAL DATA:  Emesis and diarrhea.  Bloody stool. EXAM: CT ABDOMEN AND PELVIS WITH CONTRAST TECHNIQUE: Multidetector CT imaging of the abdomen and pelvis was performed using the standard protocol following bolus administration of intravenous contrast. RADIATION DOSE REDUCTION: This exam was performed according to the departmental dose-optimization program which includes automated exposure control, adjustment of the mA and/or kV according to patient size and/or use of iterative reconstruction technique. CONTRAST:  75mL OMNIPAQUE IOHEXOL 350 MG/ML SOLN COMPARISON:  12/09/2021 CT.  X-ray 11/26/2022 FINDINGS: Lower chest: Lung bases are clear. No pleural effusion. Small hiatal hernia. There is a also a small amount of fluid adjacent to the hernia sac. Hepatobiliary: Fatty liver infiltration. No space-occupying liver lesion. Patent portal vein. Gallbladder is dilated. Pancreas: There is peripancreatic fat stranding. There is some areas of fluid towards the pancreatic head in the pancreatic groove with the duodenal. Please correlate for clinical evidence of pancreatitis. Grossly preserved pancreatic enhancement. However there is a area of presumed hyperenhancement along the posterior aspect of the pancreatic head/uncinate measuring 8 mm on series 3, image 30 which does not persistent same density on delayed and has a density of vasculature worrisome for a developing pseudoaneurysm. Small amount of surrounding fluid or thrombus suggested as well on series 3, image 30. Spleen: Normal in size without focal abnormality. Adrenals/Urinary Tract: Diffuse mild thickening of the adrenal glands is stable, nonspecific. Mild  parenchymal atrophy. Bosniak 1 and 2 renal cysts are stable. No imaging follow-up. The ureters have normal course and caliber extending down to the bladder. Preserved contours of the urinary bladder. Stomach/Bowel: On this non oral contrast exam large bowel has a normal course and caliber with scattered colonic diverticula. The stomach and small bowel are nondilated. Normal appendix. Vascular/Lymphatic: Diffuse vascular calcifications. Normal caliber IVC. There is no specific abnormal lymph node enlargement identified in the abdomen and pelvis. Once again there aneurysm formation of the abdominal aorta measuring up to 3.3 cm previously and today 3.7 cm when measured in a similar fashion. There appears to be a component of the penetrating ulcer into the plaque and thrombus anterolateral on axial series 3, image 35. Additional focus more caudal as well on image 42. Please see coronal series 6, image 72. This has more caudal focus appears new from the prior CT scan. Appearance Reproductive: Prostate is unremarkable. Other: Small fat containing right inguinal hernia. Trace ascites in the right upper quadrant near the liver. No free intra-air. Musculoskeletal: Osteopenia with degenerative changes. Streak artifact related to the patient's dynamic right hip screw at the edge of the imaging field. There also pars defects at L5 with a at most trace listhesis. Stable mild compression of T12 IMPRESSION: Peripancreatic fat stranding identified including significant stranding towards the pancreatic groove. Please correlate for clinical evidence of pancreatitis. No well-defined fluid collections. However there is a focal 8 mm area of hyperenhancement posterior to the pancreatic head and uncinate worrisome for developing pseudoaneurysm. Recommend further evaluation. Fatty liver infiltration. Dilated gallbladder. Follow up ultrasound is recommended when appropriate to assess for stones. Trace ascites. 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 There is also increasing changes of a penetrating enhancing ulcer along the mural plaque and thrombus along the lower abdominal aorta. Recommend vascular surgery consultation when appropriate. Colonic diverticula. Critical Value/emergent results were called by telephone at the time of interpretation on 04/06/2023 at 12:32 pm to provider Pricilla Loveless , who verbally acknowledged these results. Electronically Signed   By: Karen Kays  M.D.   On: 04/06/2023 15:38   DG Chest Portable 1 View  Result Date: 04/06/2023 CLINICAL DATA:  Dyspnea.  Abdominal pain. EXAM: PORTABLE CHEST 1 VIEW COMPARISON:  02/21/2022. FINDINGS: Bilateral lung fields are clear. Bilateral costophrenic angles are clear. Normal cardio-mediastinal silhouette. No acute osseous abnormalities. The soft tissues are within normal limits. No free air under the domes of diaphragm. IMPRESSION: 1. No active disease. Electronically Signed   By: Jules Schick M.D.   On: 04/06/2023 14:02    Procedures Procedures    Medications Ordered in ED Medications  HYDROmorphone (DILAUDID) injection 0.5 mg (has no administration in time range)  HYDROmorphone (DILAUDID) injection 0.5 mg (0.5 mg Intravenous Given 04/06/23 1159)  ondansetron (ZOFRAN) injection 4 mg (4 mg Intravenous Given 04/06/23 1200)  iohexol (OMNIPAQUE) 350 MG/ML injection 75 mL (75 mLs Intravenous Contrast Given 04/06/23 1317)    ED Course/ Medical Decision Making/ A&P                                 Medical Decision Making Amount and/or Complexity of Data Reviewed Labs: ordered.    Details: Elevated lipase consistent with pancreatitis. Radiology: ordered and independent interpretation performed.    Details: Pancreatitis on CT ECG/medicine tests: ordered and independent interpretation performed.    Details: Sinus tachycardia  Risk Prescription drug management. Decision regarding hospitalization.   Patient was given multiple  rounds of Dilaudid for pain.  Seems to help each time.  To have pancreatitis on workup.  He denies any recent EtOH use in the last several weeks.  However he does have a known history of prior alcohol abuse, may be playing a role.  No other clear cause at this time.  He will need admission.  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  Discussed with Dr. Karin Lieu of vascular surgery, he does not think that he has an acute aortic problem but this pseudoaneurysm may be real and he suggests IR consultation.  I have discussed with IR who will consult and see tomorrow but for now would hold off on a repeat CTA given his GFR.  However they did state that if he seems to get worse or has new blood in stool (last blood in his stool was a couple weeks ago) or in his vomit, then to get a CTA and consult IR.  Will need admission I discussed with Dr. Tyson Babinski.        Final Clinical Impression(s) / ED Diagnoses Final diagnoses:  Acute pancreatitis, unspecified complication status, unspecified pancreatitis type    Rx / DC Orders ED Discharge Orders     None         Pricilla Loveless, MD 04/06/23 1702

## 2023-04-06 NOTE — ED Notes (Signed)
ED TO INPATIENT HANDOFF REPORT  ED Nurse Name and Phone #: Rodney Booze [phone]  S Name/Age/Gender Luis Marquez 62 y.o. male Room/Bed: 005C/005C  Code Status   Code Status: Prior  Home/SNF/Other Home Patient oriented to: self, place, time, and situation Is this baseline? Yes   Triage Complete: Triage complete  Chief Complaint Acute pancreatitis [K85.90]  Triage Note Pt from home via [facility]. Pt reports emesis and diarrhea. Denies fevers. Pt also reports bloody stool last week but has since resolved.    Allergies No Known Allergies  Level of Care/Admitting Diagnosis ED Disposition     ED Disposition  Admit   Condition  --   Comment  Hospital Area: [facility] [[id number]]  Level of Care: Telemetry Medical [104]  May admit patient to [facility] or [facility] if equivalent level of care is available:: No  Covid Evaluation: Asymptomatic - no recent exposure (last 10 days) testing not required  Diagnosis: Acute pancreatitis [577.0.ICD-9-CM]  Admitting Physician: Joycelyn Das [[id number]]  Attending Physician: Joycelyn Das [[id number]]  Certification:: I certify this patient will need inpatient services for at least 2 midnights  Expected Medical Readiness: 04/08/2023          B Medical/Surgery History 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   Emphysema, unspecified (HCC)    ETOH abuse    Fracture, intertrochanteric, right femur (HCC) 11/11/2014   Hypertension    Hypomagnesemia    Laceration of head 11/03/2021   Paroxysmal A-fib (HCC)    Syncope 12/07/2021   Tobacco abuse    Past Surgical History:  Procedure Laterality Date   BIOPSY  12/10/2021   Procedure: BIOPSY;  Surgeon: Napoleon Form, MD;  Location: [facility] ENDOSCOPY;  Service: Gastroenterology;;   BIOPSY  11/24/2022   Procedure: BIOPSY;  Surgeon: Beverley Fiedler, MD;  Location: [facility] ENDOSCOPY;  Service: Gastroenterology;;    COLONOSCOPY WITH PROPOFOL N/A 11/24/2022   Procedure: COLONOSCOPY WITH PROPOFOL;  Surgeon: Beverley Fiedler, MD;  Location: [facility] ENDOSCOPY;  Service: Gastroenterology;  Laterality: N/A;   ESOPHAGOGASTRODUODENOSCOPY (EGD) WITH PROPOFOL N/A 12/10/2021   Procedure: ESOPHAGOGASTRODUODENOSCOPY (EGD) WITH PROPOFOL;  Surgeon: Napoleon Form, MD;  Location: [facility] ENDOSCOPY;  Service: Gastroenterology;  Laterality: N/A;   ESOPHAGOGASTRODUODENOSCOPY (EGD) WITH PROPOFOL N/A 11/24/2022   Procedure: ESOPHAGOGASTRODUODENOSCOPY (EGD) WITH PROPOFOL;  Surgeon: Beverley Fiedler, MD;  Location: [facility] ENDOSCOPY;  Service: Gastroenterology;  Laterality: N/A;   HEMOSTASIS CLIP PLACEMENT  12/10/2021   Procedure: HEMOSTASIS CLIP PLACEMENT;  Surgeon: Napoleon Form, MD;  Location: [facility] ENDOSCOPY;  Service: Gastroenterology;;   HERNIA REPAIR     INTRAMEDULLARY (IM) NAIL INTERTROCHANTERIC Right 11/11/2014   Procedure: INTRAMEDULLARY (IM) NAIL INTERTROCHANTRIC;  Surgeon: Durene Romans, MD;  Location: [facility] ORS;  Service: Orthopedics;  Laterality: Right;   POLYPECTOMY  11/24/2022   Procedure: POLYPECTOMY;  Surgeon: Beverley Fiedler, MD;  Location: [facility] ENDOSCOPY;  Service: Gastroenterology;;   RIGHT/LEFT HEART CATH AND CORONARY ANGIOGRAPHY N/A 11/11/2021   Procedure: RIGHT/LEFT HEART CATH AND CORONARY ANGIOGRAPHY;  Surgeon: Dolores Patty, MD;  Location: [facility] INVASIVE CV LAB;  Service: Cardiovascular;  Laterality: N/A;   Rod right leg       A IV Location/Drains/Wounds Patient Lines/Drains/Airways Status     Active Line/Drains/Airways     Name Placement date Placement time Site Days   Peripheral IV 04/06/23 20 G Left;Posterior Hand 04/06/23  1159  Hand  less than 1   Wound /  Incision (Open or Dehisced) 12/08/21 Laceration Head Posterior;Mid closed with staples x 3 12/08/21  0030  Head  484            Intake/Output Last 24 hours No intake or output data in the 24 hours ending 04/06/23 1657  Labs/Imaging Results for orders  placed or performed during the hospital encounter of 04/06/23 (from the past 48 hour(s))  Type and screen [facility]     Status: None   Collection Time: 04/06/23 11:35 AM  Result Value Ref Range   ABO/RH(D) O POS    Antibody Screen NEG    Sample Expiration      04/09/2023,2359 Performed at [facility], [address] [place], [place] [address]   Comprehensive metabolic panel     Status: Abnormal   Collection Time: 04/06/23 11:36 AM  Result Value Ref Range   Sodium 136 135 - 145 mmol/L   Potassium 5.4 (H) 3.5 - 5.1 mmol/L   Chloride 103 98 - 111 mmol/L   CO2 19 (L) 22 - 32 mmol/L   Glucose, Bld 132 (H) 70 - 99 mg/dL    Comment: Glucose reference range applies only to samples taken after fasting for at least 8 hours.   BUN 29 (H) 8 - 23 mg/dL   Creatinine, Ser 0.25 (H) 0.61 - 1.24 mg/dL   Calcium 8.4 (L) 8.9 - 10.3 mg/dL   Total Protein 6.4 (L) 6.5 - 8.1 g/dL   Albumin 2.5 (L) 3.5 - 5.0 g/dL   AST 34 15 - 41 U/L   ALT 21 0 - 44 U/L   Alkaline Phosphatase 129 (H) 38 - 126 U/L   Total Bilirubin 1.0 0.3 - 1.2 mg/dL   GFR, Estimated 46 (L) >60 mL/min    Comment: (NOTE) Calculated using the CKD-EPI Creatinine Equation (2021)    Anion gap 14 5 - 15    Comment: Performed at [facility], [address] [place], [place] [address]  Ethanol     Status: None   Collection Time: 04/06/23 11:36 AM  Result Value Ref Range   Alcohol, Ethyl (B) <10 <10 mg/dL    Comment: (NOTE) Lowest detectable limit for serum alcohol is 10 mg/dL.  For medical purposes only. Performed at [facility], [address], [place], [place] [address]   CBC with Differential     Status: Abnormal   Collection Time: 04/06/23 11:36 AM  Result Value Ref Range   WBC 15.2 (H) 4.0 - 10.5 K/uL   RBC 3.75 (L) 4.22 - 5.81 MIL/uL   Hemoglobin 11.2 (L) 13.0 - 17.0 g/dL   HCT 83.1 (L) 51.7 - 61.6 %   MCV 91.5 80.0 - 100.0 fL   MCH 29.9 26.0 - 34.0 pg   MCHC 32.7 30.0 - 36.0 g/dL    RDW 07.3 71.0 - 62.6 %   Platelets 375 150 - 400 K/uL   nRBC 0.0 0.0 - 0.2 %   Neutrophils Relative % 90 %   Neutro Abs 13.8 (H) 1.7 - 7.7 K/uL   Lymphocytes Relative 5 %   Lymphs Abs 0.7 0.7 - 4.0 K/uL   Monocytes Relative 4 %   Monocytes Absolute 0.6 0.1 - 1.0 K/uL   Eosinophils Relative 0 %   Eosinophils Absolute 0.0 0.0 - 0.5 K/uL   Basophils Relative 0 %   Basophils Absolute 0.0 0.0 - 0.1 K/uL   Immature Granulocytes 1 %   Abs Immature Granulocytes 0.07 0.00 -  0.07 K/uL    Comment: Performed at [facility], [address] [place], [place] [address]  Lipase, blood     Status: Abnormal   Collection Time: 04/06/23 11:36 AM  Result Value Ref Range   Lipase 1,685 (H) 11 - 51 U/L    Comment: RESULTS CONFIRMED BY MANUAL DILUTION Performed at [facility], [address] [place], [place] [address]   I-stat chem 8, ED     Status: Abnormal   Collection Time: 04/06/23 11:51 AM  Result Value Ref Range   Sodium 135 135 - 145 mmol/L   Potassium 5.9 (H) 3.5 - 5.1 mmol/L   Chloride 106 98 - 111 mmol/L   BUN 41 (H) 8 - 23 mg/dL   Creatinine, Ser 0.34 (H) 0.61 - 1.24 mg/dL   Glucose, Bld 742 (H) 70 - 99 mg/dL    Comment: Glucose reference range applies only to samples taken after fasting for at least 8 hours.   Calcium, Ion 1.04 (L) 1.15 - 1.40 mmol/L   TCO2 21 (L) 22 - 32 mmol/L   Hemoglobin 11.6 (L) 13.0 - 17.0 g/dL   HCT 59.5 (L) 63.8 - 75.6 %   CT ABDOMEN PELVIS W CONTRAST  Result Date: 04/06/2023 CLINICAL DATA:  Emesis and diarrhea.  Bloody stool. EXAM: CT ABDOMEN AND PELVIS WITH CONTRAST TECHNIQUE: Multidetector CT imaging of the abdomen and pelvis was performed using the standard protocol following bolus administration of intravenous contrast. RADIATION DOSE REDUCTION: This exam was performed according to the departmental dose-optimization program which includes automated exposure control, adjustment of the mA and/or kV according to patient size and/or use of  iterative reconstruction technique. CONTRAST:  75mL OMNIPAQUE IOHEXOL 350 MG/ML SOLN COMPARISON:  12/09/2021 CT.  X-ray 11/26/2022 FINDINGS: Lower chest: Lung bases are clear. No pleural effusion. Small hiatal hernia. There is a also a small amount of fluid adjacent to the hernia sac. Hepatobiliary: Fatty liver infiltration. No space-occupying liver lesion. Patent portal vein. Gallbladder is dilated. Pancreas: There is peripancreatic fat stranding. There is some areas of fluid towards the pancreatic head in the pancreatic groove with the duodenal. Please correlate for clinical evidence of pancreatitis. Grossly preserved pancreatic enhancement. However there is a area of presumed hyperenhancement along the posterior aspect of the pancreatic head/uncinate measuring 8 mm on series 3, image 30 which does not persistent same density on delayed and has a density of vasculature worrisome for a developing pseudoaneurysm. Small amount of surrounding fluid or thrombus suggested as well on series 3, image 30. Spleen: Normal in size without focal abnormality. Adrenals/Urinary Tract: Diffuse mild thickening of the adrenal glands is stable, nonspecific. Mild parenchymal atrophy. Bosniak 1 and 2 renal cysts are stable. No imaging follow-up. The ureters have normal course and caliber extending down to the bladder. Preserved contours of the urinary bladder. Stomach/Bowel: On this non oral contrast exam large bowel has a normal course and caliber with scattered colonic diverticula. The stomach and small bowel are nondilated. Normal appendix. Vascular/Lymphatic: Diffuse vascular calcifications. Normal caliber IVC. There is no specific abnormal lymph node enlargement identified in the abdomen and pelvis. Once again there aneurysm formation of the abdominal aorta measuring up to 3.3 cm previously and today 3.7 cm when measured in a similar fashion. There appears to be a component of the penetrating ulcer into the plaque and thrombus  anterolateral on axial series 3, image 35. Additional focus more caudal as well on image 42. Please see coronal series 6, image 72.  This has more caudal focus appears new from the prior CT scan. Appearance Reproductive: Prostate is unremarkable. Other: Small fat containing right inguinal hernia. Trace ascites in the right upper quadrant near the liver. No free intra-air. Musculoskeletal: Osteopenia with degenerative changes. Streak artifact related to the patient's dynamic right hip screw at the edge of the imaging field. There also pars defects at L5 with a at most trace listhesis. Stable mild compression of T12 IMPRESSION: Peripancreatic fat stranding identified including significant stranding towards the pancreatic groove. Please correlate for clinical evidence of pancreatitis. No well-defined fluid collections. However there is a focal 8 mm area of hyperenhancement posterior to the pancreatic head and uncinate worrisome for developing pseudoaneurysm. Recommend further evaluation. Fatty liver infiltration. Dilated gallbladder. Follow up ultrasound is recommended when appropriate to assess for stones. Trace ascites. Increasing size abdominal aortic aneurysm now measuring up to 3.7 cm. There is also increasing changes of a penetrating enhancing ulcer along the mural plaque and thrombus along the lower abdominal aorta. Recommend vascular surgery consultation when appropriate. Colonic diverticula. Critical Value/emergent results were called by telephone at the time of interpretation on 04/06/2023 at 12:32 pm to provider Pricilla Loveless , who verbally acknowledged these results. Electronically Signed   By: Karen Kays M.D.   On: 04/06/2023 15:38   DG Chest Portable 1 View  Result Date: 04/06/2023 CLINICAL DATA:  Dyspnea.  Abdominal pain. EXAM: PORTABLE CHEST 1 VIEW COMPARISON:  02/21/2022. FINDINGS: Bilateral lung fields are clear. Bilateral costophrenic angles are clear. Normal cardio-mediastinal silhouette. No  acute osseous abnormalities. The soft tissues are within normal limits. No free air under the domes of diaphragm. IMPRESSION: 1. No active disease. Electronically Signed   By: Jules Schick M.D.   On: 04/06/2023 14:02    Pending Labs Unresulted Labs (From admission, onward)     Start     Ordered   04/06/23 1136  Urinalysis, w/ Reflex to Culture (Infection Suspected) -Urine, Clean Catch  Once,   URGENT       Question:  Specimen Source  Answer:  Urine, Clean Catch   04/06/23 1136            Vitals/Pain Today's Vitals   04/06/23 1200 04/06/23 1228 04/06/23 1300 04/06/23 1338  BP: 120/89  107/81   Pulse: 92  96 93  Resp: (!) 26  17 19   Temp: 98.5 F (36.9 C)     TempSrc: Oral     SpO2: 100%  94% 100%  PainSc:  8   2     Isolation Precautions No active isolations  Medications Medications  HYDROmorphone (DILAUDID) injection 0.5 mg (has no administration in time range)  HYDROmorphone (DILAUDID) injection 0.5 mg (0.5 mg Intravenous Given 04/06/23 1159)  ondansetron (ZOFRAN) injection 4 mg (4 mg Intravenous Given 04/06/23 1200)  iohexol (OMNIPAQUE) 350 MG/ML injection 75 mL (75 mLs Intravenous Contrast Given 04/06/23 1317)    Mobility walks     Focused Assessments Cardiac Assessment Handoff:    No results found for: "CKTOTAL", "CKMB", "CKMBINDEX", "TROPONINI" No results found for: "DDIMER" Does the Patient currently have chest pain? No    R Recommendations: See Admitting Provider Note  Report given to:   Additional Notes:

## 2023-04-06 NOTE — H&P (Addendum)
[facility] History and Physical  Luis Marquez ZOX:[id number] DOB: 1961-02-06 DOA: 04/06/2023  Referring physician: ED  PCP: Claiborne Rigg, NP   Patient is coming from: Home  Chief Complaint: Vomiting, abdominal pain  HPI:   Patient is a 62 years old male with past medical history of alcohol abuse, COPD, paroxysmal atrial fibrillation, systolic heart failure and previous history of GI bleed diverticulosis presented to hospital with abdominal pain for few weeks.  He stated that he did have diffuse abdominal pain initially but then for the last couple of days has localized more to the epigastric region.  Over the last few days she has been having episodes of multiple vomiting and had diarrhea with blood in the stool which stopped after using Imodium.  Last bowel movement was 3 days back.   No mention of hematemesis or melena.  He has history of chronic dyspnea without any chest pain or leg swelling.  Patient denies any chest pain, fever, chills or rigor.  Denies any dizziness lightheadedness or syncope.  Denies any urinary urgency, frequency or dysuria.  Denies any recent travel or sick contacts.  Denies history of gallstones.  Patient states that his last alcoholic drink was March 21, 2023.   In the ED, patient was hemodynamically stable.  CBC showed hemoglobin of 11.2, WBC at 15.2 with creatinine of 1.6 potassium was elevated at 5.9.  Alcohol level was less than 10.  Lipase was elevated at 1685.  Patient had a CT scan which showed focal 8 mm area of hyperenhancement posterior to the pancreatic head and uncinate which improved improving pseudoaneurysm.  There was mention of peripancreatic fat stranding towards the pancreatic groove.  Patient was then considered for admission to the hospital for further evaluation and treatment.  Assessment and plan.  Acute pancreatitis.   History of alcohol abuse. Will put the patient n.p.o., IV fluids, antiemetics, analgesics.  Continue supportive  care.  Check lipase level in AM.  Add IV PPI.  Get ultrasound of the right upper quadrant.  Concerns for pancreatic pseudoaneurysm. As per CT scan of the abdomen.  Vascular surgery was noted from the ED who recommended IR evaluation.  IR on board.  At this time, patient is hemodynamically stable.  If continues to bleed, IR recommends CTA.  History of COPD/emphysema. Continue bronchodilators.  Currently compensated.  History of paroxysmal atrial fibrillation. On Eliquis as outpatient.  Will continue to hold for now due to concerns for recent GI bleed..  Chronic systolic congestive heart failure.   Compensated at this time.  Will continue with IV fluids.  Keep n.p.o. for now.  Reassess for fluids needs in the morning.  History of hypertension. Will continue to monitor.  Will add as needed hydralazine.  Borderline hyperkalemia.  Will continue to monitor.  Check levels in AM.  Continue with IV fluids.  Elevated creatinine.  Could be AKI.  Creatinine 0.64 months back.  Continue with IV fluids and check BMP in AM.  Leukocytosis.  Likely reactive.  Will monitor in AM.   DVT Prophylaxis:  SCD  Review of Systems:  All systems were reviewed and were negative unless otherwise mentioned in the HPI   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   Emphysema, unspecified (HCC)    ETOH abuse    Fracture, intertrochanteric, right femur (HCC) 11/11/2014   Hypertension    Hypomagnesemia    Laceration of head 11/03/2021  Paroxysmal A-fib (HCC)    Syncope 12/07/2021   Tobacco abuse    Past Surgical History:  Procedure Laterality Date   BIOPSY  12/10/2021   Procedure: BIOPSY;  Surgeon: Napoleon Form, MD;  Location: [facility] ENDOSCOPY;  Service: Gastroenterology;;   BIOPSY  11/24/2022   Procedure: BIOPSY;  Surgeon: Beverley Fiedler, MD;  Location: [facility] ENDOSCOPY;  Service: Gastroenterology;;   COLONOSCOPY WITH PROPOFOL N/A  11/24/2022   Procedure: COLONOSCOPY WITH PROPOFOL;  Surgeon: Beverley Fiedler, MD;  Location: [facility] ENDOSCOPY;  Service: Gastroenterology;  Laterality: N/A;   ESOPHAGOGASTRODUODENOSCOPY (EGD) WITH PROPOFOL N/A 12/10/2021   Procedure: ESOPHAGOGASTRODUODENOSCOPY (EGD) WITH PROPOFOL;  Surgeon: Napoleon Form, MD;  Location: [facility] ENDOSCOPY;  Service: Gastroenterology;  Laterality: N/A;   ESOPHAGOGASTRODUODENOSCOPY (EGD) WITH PROPOFOL N/A 11/24/2022   Procedure: ESOPHAGOGASTRODUODENOSCOPY (EGD) WITH PROPOFOL;  Surgeon: Beverley Fiedler, MD;  Location: [facility] ENDOSCOPY;  Service: Gastroenterology;  Laterality: N/A;   HEMOSTASIS CLIP PLACEMENT  12/10/2021   Procedure: HEMOSTASIS CLIP PLACEMENT;  Surgeon: Napoleon Form, MD;  Location: [facility] ENDOSCOPY;  Service: Gastroenterology;;   HERNIA REPAIR     INTRAMEDULLARY (IM) NAIL INTERTROCHANTERIC Right 11/11/2014   Procedure: INTRAMEDULLARY (IM) NAIL INTERTROCHANTRIC;  Surgeon: Durene Romans, MD;  Location: [facility] ORS;  Service: Orthopedics;  Laterality: Right;   POLYPECTOMY  11/24/2022   Procedure: POLYPECTOMY;  Surgeon: Beverley Fiedler, MD;  Location: [facility] ENDOSCOPY;  Service: Gastroenterology;;   RIGHT/LEFT HEART CATH AND CORONARY ANGIOGRAPHY N/A 11/11/2021   Procedure: RIGHT/LEFT HEART CATH AND CORONARY ANGIOGRAPHY;  Surgeon: Dolores Patty, MD;  Location: [facility] INVASIVE CV LAB;  Service: Cardiovascular;  Laterality: N/A;   Rod right leg      Social History:  reports that he has been smoking cigarettes. He has a 12.5 pack-year smoking history. He has never been exposed to tobacco smoke. He has never used smokeless tobacco. He reports current alcohol use. He reports that he does not currently use drugs.  No Known Allergies  Family History  Problem Relation Age of Onset   Diabetes Mother    Colon cancer Neg Hx    Stomach cancer Neg Hx    Esophageal cancer Neg Hx      Prior to Admission medications   Medication Sig Start Date End Date Taking? Authorizing Provider   albuterol (VENTOLIN HFA) 108 (90 Base) MCG/ACT inhaler Inhale 2 puffs into the lungs every 6 (six) hours as needed for wheezing or shortness of breath. 12/27/22   Claiborne Rigg, NP  apixaban (ELIQUIS) 5 MG TABS tablet Take 1 tablet (5 mg total) by mouth 2 (two) times daily. 08/16/22   Claiborne Rigg, NP  atorvastatin (LIPITOR) 40 MG tablet Take 1 tablet (40 mg total) by mouth daily. 02/04/23   Claiborne Rigg, NP  B Complex-C-Folic Acid (B-COMPLEX BALANCED) TABS Take 1 tablet by mouth daily.    [provider]  budesonide-formoterol (SYMBICORT) 160-4.5 MCG/ACT inhaler Inhale 2 puffs into the lungs 2 (two) times daily. 08/16/22   Hoy Register, MD  loperamide (IMODIUM A-D) 2 MG tablet Take 1 tablet (2 mg total) by mouth 4 (four) times daily as needed for diarrhea or loose stools. 03/21/23   Claiborne Rigg, NP  losartan (COZAAR) 100 MG tablet Take 1 tablet (100 mg total) by mouth daily. 10/25/22   Bensimhon, Bevelyn Buckles, MD  metoprolol succinate (TOPROL-XL) 25 MG 24 hr tablet Take 1 tablet (25 mg total) by mouth daily. 12/27/22   Claiborne Rigg, NP  Misc Natural Products (  OSTEO BI-FLEX TRIPLE STRENGTH PO) Take 1 mg by mouth 2 (two) times daily.    [provider]  Multiple Vitamin (MULTIVITAMIN) tablet Take 1 tablet by mouth daily.    [provider]  Omega-3 Fatty Acids (OMEGA-3 FISH OIL) 1200 MG CAPS Take 1 capsule by mouth daily.    [provider]  pantoprazole (PROTONIX) 40 MG tablet Take 1 tablet (40 mg total) by mouth 2 (two) times daily. 08/16/22   Claiborne Rigg, NP  PEG-KCl-NaCl-NaSulf-Na Asc-C (PLENVU) 140 g SOLR Take 1 kit by mouth as directed. Use coupon: BIN: 161096 PNC: CNRX Group: [id number] ID: [id number] 09/20/22   Zehr, Shanda Bumps D, PA-C  potassium chloride SA (KLOR-CON M) 20 MEQ tablet Take 2 tablets (40 mEq total) by mouth daily at 12 noon for 1 day, THEN 1 tablet (20 mEq total) daily at 12 noon. 10/10/22 10/11/23  Jacklynn Ganong, FNP   tetrahydrozoline 0.05 % ophthalmic solution Place 2 drops into both eyes daily.    [provider]    Physical Exam: Vitals:   04/06/23 1200 04/06/23 1300 04/06/23 1338  BP: 120/89 107/81   Pulse: 92 96 93  Resp: (!) 26 17 19   Temp: 98.5 F (36.9 C)    TempSrc: Oral    SpO2: 100% 94% 100%   Wt Readings from Last 3 Encounters:  03/21/23 94.1 kg  03/13/23 95.4 kg  02/21/23 98.4 kg   There is no height or weight on file to calculate BMI.  General:  Average built, not in obvious distress HENT: Normocephalic, No scleral pallor or icterus noted. Oral mucosa is moist.  Chest:  Clear breath sounds.  No crackles or wheezes.  CVS: S1 &S2 heard. No murmur.  Regular rate and rhythm. Abdomen: Soft, tenderness over the epigastric region, nondistended.  Bowel sounds are heard. No abdominal mass palpated Extremities: No cyanosis, clubbing or edema.  Peripheral pulses are palpable. Psych: Alert, awake and oriented, normal mood CNS:  No cranial nerve deficits.  Power equal in all extremities.   Skin: Warm and dry.  No rashes noted.  Labs on Admission:   CBC: Recent Labs  Lab 04/06/23 1136 04/06/23 1151  WBC 15.2*  --   NEUTROABS 13.8*  --   HGB 11.2* 11.6*  HCT 34.3* 34.0*  MCV 91.5  --   PLT 375  --     Basic Metabolic Panel: Recent Labs  Lab 04/06/23 1136 04/06/23 1151  NA 136 135  K 5.4* 5.9*  CL 103 106  CO2 19*  --   GLUCOSE 132* 134*  BUN 29* 41*  CREATININE 1.64* 1.60*  CALCIUM 8.4*  --     Liver Function Tests: Recent Labs  Lab 04/06/23 1136  AST 34  ALT 21  ALKPHOS 129*  BILITOT 1.0  PROT 6.4*  ALBUMIN 2.5*   Recent Labs  Lab 04/06/23 1136  LIPASE 1,685*   No results for input(s): "AMMONIA" in the last 168 hours.  Cardiac Enzymes: No results for input(s): "CKTOTAL", "CKMB", "CKMBINDEX", "TROPONINI" in the last 168 hours.  BNP (last 3 results) Recent Labs    11/24/22 0548 11/27/22 0523  BNP 290.0* 165.0*    ProBNP (last 3  results) No results for input(s): "PROBNP" in the last 8760 hours.  CBG: No results for input(s): "GLUCAP" in the last 168 hours.  Lipase     Component Value Date/Time   LIPASE 1,685 (H) 04/06/2023 1136     Urinalysis    Component Value Date/Time  COLORURINE AMBER (A) 12/08/2021 0128   APPEARANCEUR CLOUDY (A) 12/08/2021 0128   LABSPEC 1.013 12/08/2021 0128   PHURINE 5.0 12/08/2021 0128   GLUCOSEU NEGATIVE 12/08/2021 0128   HGBUR NEGATIVE 12/08/2021 0128   BILIRUBINUR NEGATIVE 12/08/2021 0128   KETONESUR NEGATIVE 12/08/2021 0128   PROTEINUR NEGATIVE 12/08/2021 0128   NITRITE NEGATIVE 12/08/2021 0128   LEUKOCYTESUR TRACE (A) 12/08/2021 0128     Drugs of Abuse     Component Value Date/Time   LABOPIA NONE DETECTED 12/08/2021 0128   COCAINSCRNUR NONE DETECTED 12/08/2021 0128   LABBENZ POSITIVE (A) 12/08/2021 0128   AMPHETMU NONE DETECTED 12/08/2021 0128   THCU NONE DETECTED 12/08/2021 0128   LABBARB NONE DETECTED 12/08/2021 0128      Radiological Exams on Admission: CT ABDOMEN PELVIS W CONTRAST  Result Date: 04/06/2023 CLINICAL DATA:  Emesis and diarrhea.  Bloody stool. EXAM: CT ABDOMEN AND PELVIS WITH CONTRAST TECHNIQUE: Multidetector CT imaging of the abdomen and pelvis was performed using the standard protocol following bolus administration of intravenous contrast. RADIATION DOSE REDUCTION: This exam was performed according to the departmental dose-optimization program which includes automated exposure control, adjustment of the mA and/or kV according to patient size and/or use of iterative reconstruction technique. CONTRAST:  75mL OMNIPAQUE IOHEXOL 350 MG/ML SOLN COMPARISON:  12/09/2021 CT.  X-ray 11/26/2022 FINDINGS: Lower chest: Lung bases are clear. No pleural effusion. Small hiatal hernia. There is a also a small amount of fluid adjacent to the hernia sac. Hepatobiliary: Fatty liver infiltration. No space-occupying liver lesion. Patent portal vein. Gallbladder is  dilated. Pancreas: There is peripancreatic fat stranding. There is some areas of fluid towards the pancreatic head in the pancreatic groove with the duodenal. Please correlate for clinical evidence of pancreatitis. Grossly preserved pancreatic enhancement. However there is a area of presumed hyperenhancement along the posterior aspect of the pancreatic head/uncinate measuring 8 mm on series 3, image 30 which does not persistent same density on delayed and has a density of vasculature worrisome for a developing pseudoaneurysm. Small amount of surrounding fluid or thrombus suggested as well on series 3, image 30. Spleen: Normal in size without focal abnormality. Adrenals/Urinary Tract: Diffuse mild thickening of the adrenal glands is stable, nonspecific. Mild parenchymal atrophy. Bosniak 1 and 2 renal cysts are stable. No imaging follow-up. The ureters have normal course and caliber extending down to the bladder. Preserved contours of the urinary bladder. Stomach/Bowel: On this non oral contrast exam large bowel has a normal course and caliber with scattered colonic diverticula. The stomach and small bowel are nondilated. Normal appendix. Vascular/Lymphatic: Diffuse vascular calcifications. Normal caliber IVC. There is no specific abnormal lymph node enlargement identified in the abdomen and pelvis. Once again there aneurysm formation of the abdominal aorta measuring up to 3.3 cm previously and today 3.7 cm when measured in a similar fashion. There appears to be a component of the penetrating ulcer into the plaque and thrombus anterolateral on axial series 3, image 35. Additional focus more caudal as well on image 42. Please see coronal series 6, image 72. This has more caudal focus appears new from the prior CT scan. Appearance Reproductive: Prostate is unremarkable. Other: Small fat containing right inguinal hernia. Trace ascites in the right upper quadrant near the liver. No free intra-air. Musculoskeletal:  Osteopenia with degenerative changes. Streak artifact related to the patient's dynamic right hip screw at the edge of the imaging field. There also pars defects at L5 with a at most trace listhesis. Stable mild compression of  T12 IMPRESSION: Peripancreatic fat stranding identified including significant stranding towards the pancreatic groove. Please correlate for clinical evidence of pancreatitis. No well-defined fluid collections. However there is a focal 8 mm area of hyperenhancement posterior to the pancreatic head and uncinate worrisome for developing pseudoaneurysm. Recommend further evaluation. Fatty liver infiltration. Dilated gallbladder. Follow up ultrasound is recommended when appropriate to assess for stones. Trace ascites. Increasing size abdominal aortic aneurysm now measuring up to 3.7 cm. There is also increasing changes of a penetrating enhancing ulcer along the mural plaque and thrombus along the lower abdominal aorta. Recommend vascular surgery consultation when appropriate. Colonic diverticula. Critical Value/emergent results were called by telephone at the time of interpretation on 04/06/2023 at 12:32 pm to provider Pricilla Loveless , who verbally acknowledged these results. Electronically Signed   By: Karen Kays M.D.   On: 04/06/2023 15:38   DG Chest Portable 1 View  Result Date: 04/06/2023 CLINICAL DATA:  Dyspnea.  Abdominal pain. EXAM: PORTABLE CHEST 1 VIEW COMPARISON:  02/21/2022. FINDINGS: Bilateral lung fields are clear. Bilateral costophrenic angles are clear. Normal cardio-mediastinal silhouette. No acute osseous abnormalities. The soft tissues are within normal limits. No free air under the domes of diaphragm. IMPRESSION: 1. No active disease. Electronically Signed   By: Jules Schick M.D.   On: 04/06/2023 14:02    EKG: Personally reviewed by me which shows sinus tachycardia   Consultant: Interventional radiology  Code Status: Full code  Microbiology none  Antibiotics:  None  Family Communication:  Patients' condition and plan of care including tests being ordered have been discussed with the patient who indicate understanding and agree with the plan.   Status is: Inpatient  Severity of Illness: The appropriate patient status for this patient is INPATIENT. Inpatient status is judged to be reasonable and necessary in order to provide the required intensity of service to ensure the patient's safety. The patient's presenting symptoms, physical exam findings, and initial radiographic and laboratory data in the context of their chronic comorbidities is felt to place them at high risk for further clinical deterioration. Furthermore, it is not anticipated that the patient will be medically stable for discharge from the hospital within 2 midnights of admission.  I certify that at the point of admission it is my clinical judgment that the patient will require inpatient hospital care spanning beyond 2 midnights from the point of admission due to high intensity of service, high risk for further deterioration and high frequency of surveillance required.  Signed, Joycelyn Das, MD [facility] 04/06/2023

## 2023-04-06 NOTE — Plan of Care (Signed)
Problem: Pain Managment: Goal: General experience of comfort will improve Outcome: Progressing   Problem: Safety: Goal: Ability to remain free from injury will improve Outcome: Progressing

## 2023-04-06 NOTE — Consult Note (Signed)
Chief Complaint: Emesis, diarrhea, hematochezia  Referring Provider(s): Pricilla Loveless  Supervising Physician: Irish Lack  Patient Status: [facility] - ED  History of Present Illness: Luis Marquez is a 62 y.o. male with medical issues including CHF, emphysema, ETOH use, and HTN.  He presented to the ED today c/o vomiting and diarrhea.  He states he had several bouts of hematochezia but his has resolved.  CT scan showed = focal 8 mm area of hyperenhancement posterior to the pancreatic head and uncinate worrisome for developing pseudoaneurysm  He is currently having some abdominal pain and asking for pain medications.  Hgb 11.2, Creatinine 1.64 with GFR 46 (was 3.16 two weeks ago).  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   Emphysema, unspecified (HCC)    ETOH abuse    Fracture, intertrochanteric, right femur (HCC) 11/11/2014   Hypertension    Hypomagnesemia    Laceration of head 11/03/2021   Paroxysmal A-fib (HCC)    Syncope 12/07/2021   Tobacco abuse     Past Surgical History:  Procedure Laterality Date   BIOPSY  12/10/2021   Procedure: BIOPSY;  Surgeon: Napoleon Form, MD;  Location: [facility] ENDOSCOPY;  Service: Gastroenterology;;   BIOPSY  11/24/2022   Procedure: BIOPSY;  Surgeon: Beverley Fiedler, MD;  Location: [facility] ENDOSCOPY;  Service: Gastroenterology;;   COLONOSCOPY WITH PROPOFOL N/A 11/24/2022   Procedure: COLONOSCOPY WITH PROPOFOL;  Surgeon: Beverley Fiedler, MD;  Location: [facility] ENDOSCOPY;  Service: Gastroenterology;  Laterality: N/A;   ESOPHAGOGASTRODUODENOSCOPY (EGD) WITH PROPOFOL N/A 12/10/2021   Procedure: ESOPHAGOGASTRODUODENOSCOPY (EGD) WITH PROPOFOL;  Surgeon: Napoleon Form, MD;  Location: [facility] ENDOSCOPY;  Service: Gastroenterology;  Laterality: N/A;   ESOPHAGOGASTRODUODENOSCOPY (EGD) WITH PROPOFOL N/A 11/24/2022   Procedure: ESOPHAGOGASTRODUODENOSCOPY (EGD) WITH PROPOFOL;  Surgeon:  Beverley Fiedler, MD;  Location: [facility] ENDOSCOPY;  Service: Gastroenterology;  Laterality: N/A;   HEMOSTASIS CLIP PLACEMENT  12/10/2021   Procedure: HEMOSTASIS CLIP PLACEMENT;  Surgeon: Napoleon Form, MD;  Location: [facility] ENDOSCOPY;  Service: Gastroenterology;;   HERNIA REPAIR     INTRAMEDULLARY (IM) NAIL INTERTROCHANTERIC Right 11/11/2014   Procedure: INTRAMEDULLARY (IM) NAIL INTERTROCHANTRIC;  Surgeon: Durene Romans, MD;  Location: [facility] ORS;  Service: Orthopedics;  Laterality: Right;   POLYPECTOMY  11/24/2022   Procedure: POLYPECTOMY;  Surgeon: Beverley Fiedler, MD;  Location: [facility] ENDOSCOPY;  Service: Gastroenterology;;   RIGHT/LEFT HEART CATH AND CORONARY ANGIOGRAPHY N/A 11/11/2021   Procedure: RIGHT/LEFT HEART CATH AND CORONARY ANGIOGRAPHY;  Surgeon: Dolores Patty, MD;  Location: [facility] INVASIVE CV LAB;  Service: Cardiovascular;  Laterality: N/A;   Rod right leg      Allergies: Patient has no known allergies.  Medications: Prior to Admission medications   Medication Sig Start Date End Date Taking? Authorizing Provider  albuterol (VENTOLIN HFA) 108 (90 Base) MCG/ACT inhaler Inhale 2 puffs into the lungs every 6 (six) hours as needed for wheezing or shortness of breath. 12/27/22   Claiborne Rigg, NP  apixaban (ELIQUIS) 5 MG TABS tablet Take 1 tablet (5 mg total) by mouth 2 (two) times daily. 08/16/22   Claiborne Rigg, NP  atorvastatin (LIPITOR) 40 MG tablet Take 1 tablet (40 mg total) by mouth daily. 02/04/23   Claiborne Rigg, NP  B Complex-C-Folic Acid (B-COMPLEX BALANCED) TABS Take 1 tablet by mouth daily.    [provider]  budesonide-formoterol (SYMBICORT) 160-4.5 MCG/ACT inhaler Inhale 2 puffs into the lungs  2 (two) times daily. 08/16/22   Hoy Register, MD  loperamide (IMODIUM A-D) 2 MG tablet Take 1 tablet (2 mg total) by mouth 4 (four) times daily as needed for diarrhea or loose stools. 03/21/23   Claiborne Rigg, NP  losartan (COZAAR) 100 MG tablet Take 1 tablet (100 mg total) by  mouth daily. 10/25/22   Bensimhon, Bevelyn Buckles, MD  metoprolol succinate (TOPROL-XL) 25 MG 24 hr tablet Take 1 tablet (25 mg total) by mouth daily. 12/27/22   Claiborne Rigg, NP  Misc Natural Products (OSTEO BI-FLEX TRIPLE STRENGTH PO) Take 1 mg by mouth 2 (two) times daily.    [provider]  Multiple Vitamin (MULTIVITAMIN) tablet Take 1 tablet by mouth daily.    [provider]  Omega-3 Fatty Acids (OMEGA-3 FISH OIL) 1200 MG CAPS Take 1 capsule by mouth daily.    [provider]  pantoprazole (PROTONIX) 40 MG tablet Take 1 tablet (40 mg total) by mouth 2 (two) times daily. 08/16/22   Claiborne Rigg, NP  PEG-KCl-NaCl-NaSulf-Na Asc-C (PLENVU) 140 g SOLR Take 1 kit by mouth as directed. Use coupon: BIN: [id number] PNC: CNRX Group: [id number] ID: [id number] 09/20/22   Zehr, Shanda Bumps D, PA-C  potassium chloride SA (KLOR-CON M) 20 MEQ tablet Take 2 tablets (40 mEq total) by mouth daily at 12 noon for 1 day, THEN 1 tablet (20 mEq total) daily at 12 noon. 10/10/22 10/11/23  Jacklynn Ganong, FNP  tetrahydrozoline 0.05 % ophthalmic solution Place 2 drops into both eyes daily.    [provider]     Family History  Problem Relation Age of Onset   Diabetes Mother    Colon cancer Neg Hx    Stomach cancer Neg Hx    Esophageal cancer Neg Hx     Social History   Socioeconomic History   Marital status: Single    Spouse name: Not on file   Number of children: 1   Years of education: Not on file   Highest education level: Not on file  Occupational History   Occupation: retired  Tobacco Use   Smoking status: Some Days    Current packs/day: 0.25    Average packs/day: 0.3 packs/day for 50.0 years (12.5 ttl pk-yrs)    Types: Cigarettes    Passive exposure: Never   Smokeless tobacco: Never  Vaping Use   Vaping status: Never Used  Substance and Sexual Activity   Alcohol use: Yes    Comment: occ   Drug use: Not Currently   Sexual activity: Yes  Other Topics Concern    Not on file  Social History Narrative   ** Merged History Encounter **       Social Determinants of Health   Financial Resource Strain: Not on file  Food Insecurity: No Food Insecurity (11/22/2022)   Hunger Vital Sign    Worried About Running Out of Food in the Last Year: Never true    Ran Out of Food in the Last Year: Never true  Transportation Needs: No Transportation Needs (11/22/2022)   PRAPARE - Administrator, Civil Service (Medical): No    Lack of Transportation (Non-Medical): No  Physical Activity: Not on file  Stress: Not on file  Social Connections: Not on file     Review of Systems: A 12 point ROS discussed and pertinent positives are indicated in the HPI above.  All other systems are negative.   Vital Signs: BP 107/81   Pulse 93  Temp 98.5 F (36.9 C) (Oral)   Resp 19   SpO2 100%   Advance Care Plan: The advanced care place/surrogate decision maker was discussed at the time of visit and the patient did not wish to discuss or was not able to name a surrogate decision maker or provide an advance care plan.  Physical Exam Vitals reviewed.  Constitutional:      Appearance: Normal appearance.  HENT:     Head: Normocephalic and atraumatic.  Eyes:     Extraocular Movements: Extraocular movements intact.  Cardiovascular:     Rate and Rhythm: Normal rate and regular rhythm.  Pulmonary:     Effort: Pulmonary effort is normal. No respiratory distress.     Breath sounds: Normal breath sounds.  Abdominal:     General: There is no distension.     Palpations: Abdomen is soft.     Tenderness: There is no abdominal tenderness.  Musculoskeletal:        General: Normal range of motion.     Cervical back: Normal range of motion.  Skin:    General: Skin is warm and dry.  Neurological:     General: No focal deficit present.     Mental Status: He is alert and oriented to person, place, and time.  Psychiatric:        Mood and Affect: Mood normal.         Behavior: Behavior normal.        Thought Content: Thought content normal.        Judgment: Judgment normal.     Imaging: CT ABDOMEN PELVIS W CONTRAST  Result Date: 04/06/2023 CLINICAL DATA:  Emesis and diarrhea.  Bloody stool. EXAM: CT ABDOMEN AND PELVIS WITH CONTRAST TECHNIQUE: Multidetector CT imaging of the abdomen and pelvis was performed using the standard protocol following bolus administration of intravenous contrast. RADIATION DOSE REDUCTION: This exam was performed according to the departmental dose-optimization program which includes automated exposure control, adjustment of the mA and/or kV according to patient size and/or use of iterative reconstruction technique. CONTRAST:  75mL OMNIPAQUE IOHEXOL 350 MG/ML SOLN COMPARISON:  12/09/2021 CT.  X-ray 11/26/2022 FINDINGS: Lower chest: Lung bases are clear. No pleural effusion. Small hiatal hernia. There is a also a small amount of fluid adjacent to the hernia sac. Hepatobiliary: Fatty liver infiltration. No space-occupying liver lesion. Patent portal vein. Gallbladder is dilated. Pancreas: There is peripancreatic fat stranding. There is some areas of fluid towards the pancreatic head in the pancreatic groove with the duodenal. Please correlate for clinical evidence of pancreatitis. Grossly preserved pancreatic enhancement. However there is a area of presumed hyperenhancement along the posterior aspect of the pancreatic head/uncinate measuring 8 mm on series 3, image 30 which does not persistent same density on delayed and has a density of vasculature worrisome for a developing pseudoaneurysm. Small amount of surrounding fluid or thrombus suggested as well on series 3, image 30. Spleen: Normal in size without focal abnormality. Adrenals/Urinary Tract: Diffuse mild thickening of the adrenal glands is stable, nonspecific. Mild parenchymal atrophy. Bosniak 1 and 2 renal cysts are stable. No imaging follow-up. The ureters have normal course and caliber  extending down to the bladder. Preserved contours of the urinary bladder. Stomach/Bowel: On this non oral contrast exam large bowel has a normal course and caliber with scattered colonic diverticula. The stomach and small bowel are nondilated. Normal appendix. Vascular/Lymphatic: Diffuse vascular calcifications. Normal caliber IVC. There is no specific abnormal lymph node enlargement identified in the abdomen and  pelvis. Once again there aneurysm formation of the abdominal aorta measuring up to 3.3 cm previously and today 3.7 cm when measured in a similar fashion. There appears to be a component of the penetrating ulcer into the plaque and thrombus anterolateral on axial series 3, image 35. Additional focus more caudal as well on image 42. Please see coronal series 6, image 72. This has more caudal focus appears new from the prior CT scan. Appearance Reproductive: Prostate is unremarkable. Other: Small fat containing right inguinal hernia. Trace ascites in the right upper quadrant near the liver. No free intra-air. Musculoskeletal: Osteopenia with degenerative changes. Streak artifact related to the patient's dynamic right hip screw at the edge of the imaging field. There also pars defects at L5 with a at most trace listhesis. Stable mild compression of T12 IMPRESSION: Peripancreatic fat stranding identified including significant stranding towards the pancreatic groove. Please correlate for clinical evidence of pancreatitis. No well-defined fluid collections. However there is a focal 8 mm area of hyperenhancement posterior to the pancreatic head and uncinate worrisome for developing pseudoaneurysm. Recommend further evaluation. Fatty liver infiltration. Dilated gallbladder. Follow up ultrasound is recommended when appropriate to assess for stones. Trace ascites. Increasing size abdominal aortic aneurysm now measuring up to 3.7 cm. There is also increasing changes of a penetrating enhancing ulcer along the mural  plaque and thrombus along the lower abdominal aorta. Recommend vascular surgery consultation when appropriate. Colonic diverticula. Critical Value/emergent results were called by telephone at the time of interpretation on 04/06/2023 at 12:32 pm to provider Pricilla Loveless , who verbally acknowledged these results. Electronically Signed   By: Karen Kays M.D.   On: 04/06/2023 15:38   DG Chest Portable 1 View  Result Date: 04/06/2023 CLINICAL DATA:  Dyspnea.  Abdominal pain. EXAM: PORTABLE CHEST 1 VIEW COMPARISON:  02/21/2022. FINDINGS: Bilateral lung fields are clear. Bilateral costophrenic angles are clear. Normal cardio-mediastinal silhouette. No acute osseous abnormalities. The soft tissues are within normal limits. No free air under the domes of diaphragm. IMPRESSION: 1. No active disease. Electronically Signed   By: Jules Schick M.D.   On: 04/06/2023 14:02    Labs:  CBC: Recent Labs    11/24/22 0548 12/23/22 1142 03/21/23 1450 04/06/23 1136 04/06/23 1151  WBC 5.7 10.2 11.9* 15.2*  --   HGB 11.8* 13.7 11.5* 11.2* 11.6*  HCT 35.5* 41.5 35.8* 34.3* 34.0*  PLT 194 285.0 368 375  --     COAGS: Recent Labs    11/22/22 1038  INR 1.2    BMP: Recent Labs    11/25/22 0601 11/26/22 0536 11/28/22 0502 12/27/22 1542 03/21/23 1450 04/06/23 1136 04/06/23 1151  NA 135 139 134* 139 141 136 135  K 3.1* 3.8 3.9 4.7 5.1 5.4* 5.9*  CL 104 108 109 105 103 103 106  CO2 23 24 20* 17* 19* 19*  --   GLUCOSE 106* 84 101* 113* 105* 132* 134*  BUN 8 10 8 17  28* 29* 41*  CALCIUM 7.7* 7.7* 7.7* 10.2 9.0 8.4*  --   CREATININE 0.75 0.70 0.62 1.39* 3.16* 1.64* 1.60*  GFRNONAA >60 >60 >60  --   --  46*  --     LIVER FUNCTION TESTS: Recent Labs    11/25/22 1054 12/27/22 1542 03/21/23 1450 04/06/23 1136  BILITOT 0.9 0.6 0.3 1.0  AST 26 24 28  34  ALT 15 16 22 21   ALKPHOS 76 94 147* 129*  PROT 5.6* 7.2 6.7 6.4*  ALBUMIN 2.7* 4.1 3.6*  2.5*    TUMOR MARKERS: No results for input(s):  "AFPTM", "CEA", "CA199", "CHROMGRNA" in the last 8760 hours.  Assessment and Plan:  Diarrhea, emesis, hematochezia (which has resolved).  Focal 8 mm area of hyperenhancement posterior to the pancreatic head and uncinate worrisome for developing pseudoaneurysm.  Currently patient is hemodynamically stable.   If patient shows signs of bleeding / becomes hemodynamically unstable, recommend CTA.  I did discuss the risks and benefits of CTA. Given his renal insufficiency, he would be at risk for contrast nephropathy. He understands that this would be done for life saving measures.  Thank you for allowing our service to participate in Luis Marquez 's care.  Electronically Signed: Gwynneth Macleod, PA-C   04/06/2023, 4:17 PM      I spent a total of 20 Minutes  in face to face in clinical consultation, greater than 50% of which was counseling/coordinating care for pancreatic pseudoaneurysm.

## 2023-04-07 DIAGNOSIS — I5022 Chronic systolic (congestive) heart failure: Secondary | ICD-10-CM | POA: Diagnosis not present

## 2023-04-07 DIAGNOSIS — I480 Paroxysmal atrial fibrillation: Secondary | ICD-10-CM | POA: Diagnosis not present

## 2023-04-07 DIAGNOSIS — N179 Acute kidney failure, unspecified: Secondary | ICD-10-CM | POA: Diagnosis not present

## 2023-04-07 DIAGNOSIS — K859 Acute pancreatitis without necrosis or infection, unspecified: Secondary | ICD-10-CM | POA: Diagnosis not present

## 2023-04-07 LAB — CBC
HCT: 29 % — ABNORMAL LOW (ref 39.0–52.0)
Hemoglobin: 9.4 g/dL — ABNORMAL LOW (ref 13.0–17.0)
MCH: 29.8 pg (ref 26.0–34.0)
MCHC: 32.4 g/dL (ref 30.0–36.0)
MCV: 92.1 fL (ref 80.0–100.0)
Platelets: 253 10*3/uL (ref 150–400)
RBC: 3.15 MIL/uL — ABNORMAL LOW (ref 4.22–5.81)
RDW: 13.6 % (ref 11.5–15.5)
WBC: 11.3 10*3/uL — ABNORMAL HIGH (ref 4.0–10.5)
nRBC: 0 % (ref 0.0–0.2)

## 2023-04-07 LAB — URINALYSIS, W/ REFLEX TO CULTURE (INFECTION SUSPECTED)
Bacteria, UA: NONE SEEN
Bilirubin Urine: NEGATIVE
Glucose, UA: NEGATIVE mg/dL
Ketones, ur: NEGATIVE mg/dL
Leukocytes,Ua: NEGATIVE
Nitrite: NEGATIVE
Protein, ur: NEGATIVE mg/dL
Specific Gravity, Urine: 1.034 — ABNORMAL HIGH (ref 1.005–1.030)
pH: 5 (ref 5.0–8.0)

## 2023-04-07 LAB — COMPREHENSIVE METABOLIC PANEL
ALT: 18 U/L (ref 0–44)
AST: 28 U/L (ref 15–41)
Albumin: 2.1 g/dL — ABNORMAL LOW (ref 3.5–5.0)
Alkaline Phosphatase: 124 U/L (ref 38–126)
Anion gap: 7 (ref 5–15)
BUN: 25 mg/dL — ABNORMAL HIGH (ref 8–23)
CO2: 19 mmol/L — ABNORMAL LOW (ref 22–32)
Calcium: 6.9 mg/dL — ABNORMAL LOW (ref 8.9–10.3)
Chloride: 107 mmol/L (ref 98–111)
Creatinine, Ser: 1.16 mg/dL (ref 0.61–1.24)
GFR, Estimated: >60 mL/min (ref >60)
Glucose, Bld: 86 mg/dL (ref 70–99)
Potassium: 3.8 mmol/L (ref 3.5–5.1)
Sodium: 133 mmol/L — ABNORMAL LOW (ref 135–145)
Total Bilirubin: 0.9 mg/dL (ref 0.3–1.2)
Total Protein: 5.6 g/dL — ABNORMAL LOW (ref 6.5–8.1)

## 2023-04-07 LAB — LIPASE, BLOOD: Lipase: 301 U/L — ABNORMAL HIGH (ref 11–51)

## 2023-04-07 LAB — HEMOGLOBIN A1C
Hgb A1c MFr Bld: 6.4 % — ABNORMAL HIGH (ref 4.8–5.6)
Mean Plasma Glucose: 136.98 mg/dL

## 2023-04-07 LAB — MAGNESIUM: Magnesium: 1.5 mg/dL — ABNORMAL LOW (ref 1.7–2.4)

## 2023-04-07 MED ORDER — ADULT MULTIVITAMIN W/MINERALS CH
ORAL_TABLET | Freq: Every day | ORAL | Status: DC
  Administered 2023-04-07 – 2023-04-24 (×18): 1 via ORAL
  Filled 2023-04-07 (×18): qty 1

## 2023-04-07 MED ORDER — SIMETHICONE 40 MG/0.6ML PO SUSP
40.0000 mg | Freq: Four times a day (QID) | ORAL | Status: DC | PRN
  Administered 2023-04-07: 40 mg via ORAL
  Filled 2023-04-07 (×2): qty 0.6

## 2023-04-07 MED ORDER — CALCIUM GLUCONATE-NACL 2-0.675 GM/100ML-% IV SOLN
2.0000 g | Freq: Once | INTRAVENOUS | Status: AC
  Administered 2023-04-07: 2000 mg via INTRAVENOUS
  Filled 2023-04-07: qty 100

## 2023-04-07 MED ORDER — MOMETASONE FURO-FORMOTEROL FUM 200-5 MCG/ACT IN AERO
2.0000 | INHALATION_SPRAY | Freq: Two times a day (BID) | RESPIRATORY_TRACT | Status: DC
  Administered 2023-04-07 – 2023-04-24 (×34): 2 via RESPIRATORY_TRACT
  Filled 2023-04-07 (×2): qty 8.8

## 2023-04-07 MED ORDER — IPRATROPIUM-ALBUTEROL 0.5-2.5 (3) MG/3ML IN SOLN: 3.0000 mL | RESPIRATORY_TRACT | Status: DC | PRN

## 2023-04-07 MED ORDER — MAGNESIUM SULFATE 2 GM/50ML IV SOLN
2.0000 g | Freq: Once | INTRAVENOUS | Status: AC
  Administered 2023-04-07: 2 g via INTRAVENOUS
  Filled 2023-04-07: qty 50

## 2023-04-07 MED ORDER — BISACODYL 10 MG RE SUPP
10.0000 mg | Freq: Once | RECTAL | Status: AC
  Administered 2023-04-07: 10 mg via RECTAL
  Filled 2023-04-07: qty 1

## 2023-04-07 MED ORDER — NAPHAZOLINE-GLYCERIN 0.012-0.25 % OP SOLN
1.0000 [drp] | Freq: Four times a day (QID) | OPHTHALMIC | Status: DC | PRN
  Filled 2023-04-07: qty 15

## 2023-04-07 NOTE — Progress Notes (Addendum)
PROGRESS NOTE    Luis Marquez  NWG:[id number] DOB: [birth year] DOA: 04/06/2023 PCP: Claiborne Rigg, NP    Brief Narrative:  Patient is a 62 years old male with past medical history of alcohol abuse, COPD, paroxysmal atrial fibrillation, systolic heart failure and previous history of GI bleed diverticulosis presented to hospital with abdominal pain for few weeks.  He did have a history of diarrhea with blood in the stool which stopped after using Imodium which was couple of weeks back.  Patient complained of a pain that localized more on the epigastric region and also had vomiting and unable to keep down anything for 1 day.  No mention of hematemesis or melena.  He has history of chronic dyspnea without any chest pain or leg swelling.  He feels constipated with last bowel movement 3 days prior to presentation.  In the ED, patient was hemodynamically stable.  CBC showed hemoglobin of 11.2, WBC at 15.2 with creatinine of 1.6 potassium was elevated at 5.9.  Alcohol level was less than 10.  Lipase was elevated at 1685.  Patient had a CT scan which showed focal 8 mm area of hyperenhancement posterior to the pancreatic head and uncinate which improved improving pseudoaneurysm.  There was mention of peripancreatic fat stranding towards the pancreatic groove.  Patient was then considered for admission to the hospital for further evaluation and treatment.  Assessment and plan.  Acute pancreatitis.   History of alcohol abuse. Patient denies any nausea vomiting but has not had bowel movements.  Will try clears today.  Complains of mild distention of the abdomen bloating.  Will add simethicone and give Dulcolax suppository x 1.  Continue, IV fluids, antiemetics, analgesics, IV PPI twice daily.  Lipase on presentation at 1685 has trended down to 301..  Repeat labs pending.  Will continue to monitor.  Ultrasound of the right upper quadrant with normal-appearing gallbladder.  Concerns for pancreatic  pseudoaneurysm. As per CT scan of the abdomen.  Vascular surgery was noted from the ED who recommended IR evaluation.  IR was consulted and recommended CTA if bleeding were to happen.  At this time patient has not had any further bowel movements.  History of COPD/emphysema..   Continue bronchodilators.  Appears to be compensated.  Hypomagnesemia.  Will give 2 g of IV magnesium sulfate today.  Check levels in AM.  Latest magnesium of 1.5.  Hypocalcemia will give calcium gluconate.  Check levels in AM.  History of paroxysmal atrial fibrillation. On Eliquis as outpatient.  Will continue to hold for now.  Hold metoprolol for now since  blood pressure is marginally low.  Chronic systolic congestive heart failure.   Compensated at this time.  Will continue with IV fluids.  Start clears.  Creatinine of 1.1 today.  Continue intake and output charting Daily weights.  History of hypertension. Will continue to monitor.  Blood pressure is marginally low.  Patient is on losartan and metoprolol at home.  Will continue to hold.  Mild hyponatremia.  Will continue to monitor.  Latest sodium of 133.   DVT prophylaxis: SCDs Start: 04/06/23 1733   Code Status:     Code Status: Full Code  Disposition: Home likely in 1 to 2 days  Status is: Inpatient Remains inpatient appropriate because: Acute pancreatitis, IV fluids, electrolyte imbalance, monitor for GI bleed.   Family Communication: None at bedside  Consultants:  None  Procedures:  None  Antimicrobials:  None  Anti-infectives (From admission, onward)    None  Subjective: Today, patient was seen and examined at bedside.  Patient complains of gaseous distention and bloating today.  Has not had a bowel movement GI bleed.  No nausea vomiting.  Has mild abdominal discomfort.  Objective: Vitals:   04/07/23 0500 04/07/23 0531 04/07/23 0747 04/07/23 0753  BP:  107/78  91/64  Pulse:  91  93  Resp:  18  17  Temp:  98.4 F (36.9  C)  98.2 F (36.8 C)  TempSrc:  Oral  Oral  SpO2:  97% 97% 100%  Weight: 95.3 kg     Height:        Intake/Output Summary (Last 24 hours) at 04/07/2023 1144 Last data filed at 04/07/2023 0900 Gross per 24 hour  Intake 195.68 ml  Output 500 ml  Net -304.32 ml   Filed Weights   04/06/23 1703 04/07/23 0500  Weight: 94.1 kg 95.3 kg    Physical Examination: Body mass index is 27.71 kg/m.  General:  Average built, not in obvious distress HENT:   No scleral pallor or icterus noted. Oral mucosa is moist.  Chest:  Clear breath sounds.  Diminished breath sounds bilaterally. No crackles or wheezes.  CVS: S1 &S2 heard. No murmur.  Regular rate and rhythm. Abdomen: Soft, mild tenderness over the epigastric region, mild distention,  Bowel sounds are heard.   Extremities: No cyanosis, clubbing or edema.  Peripheral pulses are palpable. Psych: Alert, awake and oriented, normal mood CNS:  No cranial nerve deficits.  Power equal in all extremities.   Skin: Warm and dry.  No rashes noted.  Data Reviewed:   CBC: Recent Labs  Lab 04/06/23 1136 04/06/23 1151  WBC 15.2*  --   NEUTROABS 13.8*  --   HGB 11.2* 11.6*  HCT 34.3* 34.0*  MCV 91.5  --   PLT 375  --     Basic Metabolic Panel: Recent Labs  Lab 04/06/23 1136 04/06/23 1151  NA 136 135  K 5.4* 5.9*  CL 103 106  CO2 19*  --   GLUCOSE 132* 134*  BUN 29* 41*  CREATININE 1.64* 1.60*  CALCIUM 8.4*  --     Liver Function Tests: Recent Labs  Lab 04/06/23 1136  AST 34  ALT 21  ALKPHOS 129*  BILITOT 1.0  PROT 6.4*  ALBUMIN 2.5*     Radiology Studies: US ABDOMEN LIMITED RUQ (LIVER/GB)  Result Date: 04/06/2023 CLINICAL DATA:  Pancreatitis EXAM: ULTRASOUND ABDOMEN LIMITED RIGHT UPPER QUADRANT COMPARISON:  CT abdomen and pelvis dated April 06, 2019 FINDINGS: Gallbladder: No gallstones. No gallbladder wall thickening. No sonographic Murphy sign noted by sonographer. Echogenic linear opacity located near gallbladder  neck is due to redundant cystic duct when compared with same day CT. Common bile duct: Diameter: 6.5 mm Liver: No focal lesion identified. Increased parenchymal echogenicity. Portal vein is patent on color Doppler imaging with normal direction of blood flow towards the liver. Other: Trace ascites. IMPRESSION: 1. Normal sonographic appearance of the gallbladder. 2. Mildly dilated common bile duct. 3. Hepatic steatosis. 4. Trace ascites. Electronically Signed   By: Allegra Lai M.D.   On: 04/06/2023 19:36   CT ABDOMEN PELVIS W CONTRAST  Result Date: 04/06/2023 CLINICAL DATA:  Emesis and diarrhea.  Bloody stool. EXAM: CT ABDOMEN AND PELVIS WITH CONTRAST TECHNIQUE: Multidetector CT imaging of the abdomen and pelvis was performed using the standard protocol following bolus administration of intravenous contrast. RADIATION DOSE REDUCTION: This exam was performed according to the departmental dose-optimization program which includes automated  exposure control, adjustment of the mA and/or kV according to patient size and/or use of iterative reconstruction technique. CONTRAST:  75mL OMNIPAQUE IOHEXOL 350 MG/ML SOLN COMPARISON:  12/09/2021 CT.  X-ray 11/26/2022 FINDINGS: Lower chest: Lung bases are clear. No pleural effusion. Small hiatal hernia. There is a also a small amount of fluid adjacent to the hernia sac. Hepatobiliary: Fatty liver infiltration. No space-occupying liver lesion. Patent portal vein. Gallbladder is dilated. Pancreas: There is peripancreatic fat stranding. There is some areas of fluid towards the pancreatic head in the pancreatic groove with the duodenal. Please correlate for clinical evidence of pancreatitis. Grossly preserved pancreatic enhancement. However there is a area of presumed hyperenhancement along the posterior aspect of the pancreatic head/uncinate measuring 8 mm on series 3, image 30 which does not persistent same density on delayed and has a density of vasculature worrisome for a  developing pseudoaneurysm. Small amount of surrounding fluid or thrombus suggested as well on series 3, image 30. Spleen: Normal in size without focal abnormality. Adrenals/Urinary Tract: Diffuse mild thickening of the adrenal glands is stable, nonspecific. Mild parenchymal atrophy. Bosniak 1 and 2 renal cysts are stable. No imaging follow-up. The ureters have normal course and caliber extending down to the bladder. Preserved contours of the urinary bladder. Stomach/Bowel: On this non oral contrast exam large bowel has a normal course and caliber with scattered colonic diverticula. The stomach and small bowel are nondilated. Normal appendix. Vascular/Lymphatic: Diffuse vascular calcifications. Normal caliber IVC. There is no specific abnormal lymph node enlargement identified in the abdomen and pelvis. Once again there aneurysm formation of the abdominal aorta measuring up to 3.3 cm previously and today 3.7 cm when measured in a similar fashion. There appears to be a component of the penetrating ulcer into the plaque and thrombus anterolateral on axial series 3, image 35. Additional focus more caudal as well on image 42. Please see coronal series 6, image 72. This has more caudal focus appears new from the prior CT scan. Appearance Reproductive: Prostate is unremarkable. Other: Small fat containing right inguinal hernia. Trace ascites in the right upper quadrant near the liver. No free intra-air. Musculoskeletal: Osteopenia with degenerative changes. Streak artifact related to the patient's dynamic right hip screw at the edge of the imaging field. There also pars defects at L5 with a at most trace listhesis. Stable mild compression of T12 IMPRESSION: Peripancreatic fat stranding identified including significant stranding towards the pancreatic groove. Please correlate for clinical evidence of pancreatitis. No well-defined fluid collections. However there is a focal 8 mm area of hyperenhancement posterior to the  pancreatic head and uncinate worrisome for developing pseudoaneurysm. Recommend further evaluation. Fatty liver infiltration. Dilated gallbladder. Follow up ultrasound is recommended when appropriate to assess for stones. Trace ascites. Increasing size abdominal aortic aneurysm now measuring up to 3.7 cm. There is also increasing changes of a penetrating enhancing ulcer along the mural plaque and thrombus along the lower abdominal aorta. Recommend vascular surgery consultation when appropriate. Colonic diverticula. Critical Value/emergent results were called by telephone at the time of interpretation on 04/06/2023 at 12:32 pm to provider Pricilla Loveless , who verbally acknowledged these results. Electronically Signed   By: Karen Kays M.D.   On: 04/06/2023 15:38   DG Chest Portable 1 View  Result Date: 04/06/2023 CLINICAL DATA:  Dyspnea.  Abdominal pain. EXAM: PORTABLE CHEST 1 VIEW COMPARISON:  02/21/2022. FINDINGS: Bilateral lung fields are clear. Bilateral costophrenic angles are clear. Normal cardio-mediastinal silhouette. No acute osseous abnormalities. The soft tissues  are within normal limits. No free air under the domes of diaphragm. IMPRESSION: 1. No active disease. Electronically Signed   By: Jules Schick M.D.   On: 04/06/2023 14:02      LOS: 1 day    Joycelyn Das, MD [facility] Available via Epic secure chat 7am-7pm After these hours, please refer to coverage provider listed on [email or web address] 04/07/2023, 11:44 AM

## 2023-04-07 NOTE — Plan of Care (Signed)
Problem: Education: Goal: Knowledge of General Education information will improve Description Including pain rating scale, medication(s)/side effects and non-pharmacologic comfort measures Outcome: Progressing   Problem: Health Behavior/Discharge Planning: Goal: Ability to manage health-related needs will improve Outcome: Progressing   Problem: Activity: Goal: Risk for activity intolerance will decrease Outcome: Progressing   Problem: Nutrition: Goal: Adequate nutrition will be maintained Outcome: Progressing   Problem: Elimination: Goal: Will not experience complications related to bowel motility Outcome: Progressing Goal: Will not experience complications related to urinary retention Outcome: Progressing   Problem: Pain Managment: Goal: General experience of comfort will improve Outcome: Progressing   Problem: Safety: Goal: Ability to remain free from injury will improve Outcome: Progressing   Problem: Skin Integrity: Goal: Risk for impaired skin integrity will decrease Outcome: Progressing

## 2023-04-07 NOTE — Plan of Care (Signed)

## 2023-04-08 DIAGNOSIS — I5022 Chronic systolic (congestive) heart failure: Secondary | ICD-10-CM | POA: Diagnosis not present

## 2023-04-08 DIAGNOSIS — K859 Acute pancreatitis without necrosis or infection, unspecified: Secondary | ICD-10-CM | POA: Diagnosis not present

## 2023-04-08 DIAGNOSIS — I480 Paroxysmal atrial fibrillation: Secondary | ICD-10-CM | POA: Diagnosis not present

## 2023-04-08 DIAGNOSIS — N179 Acute kidney failure, unspecified: Secondary | ICD-10-CM | POA: Diagnosis not present

## 2023-04-08 LAB — COMPREHENSIVE METABOLIC PANEL
ALT: 18 U/L (ref 0–44)
AST: 32 U/L (ref 15–41)
Albumin: 2.1 g/dL — ABNORMAL LOW (ref 3.5–5.0)
Alkaline Phosphatase: 115 U/L (ref 38–126)
Anion gap: 8 (ref 5–15)
BUN: 18 mg/dL (ref 8–23)
CO2: 21 mmol/L — ABNORMAL LOW (ref 22–32)
Calcium: 7.7 mg/dL — ABNORMAL LOW (ref 8.9–10.3)
Chloride: 104 mmol/L (ref 98–111)
Creatinine, Ser: 1.07 mg/dL (ref 0.61–1.24)
GFR, Estimated: >60 mL/min (ref >60)
Glucose, Bld: 92 mg/dL (ref 70–99)
Potassium: 4.8 mmol/L (ref 3.5–5.1)
Sodium: 133 mmol/L — ABNORMAL LOW (ref 135–145)
Total Bilirubin: 1.1 mg/dL (ref 0.3–1.2)
Total Protein: 5.4 g/dL — ABNORMAL LOW (ref 6.5–8.1)

## 2023-04-08 LAB — CBC
HCT: 28.6 % — ABNORMAL LOW (ref 39.0–52.0)
Hemoglobin: 9.4 g/dL — ABNORMAL LOW (ref 13.0–17.0)
MCH: 30.9 pg (ref 26.0–34.0)
MCHC: 32.9 g/dL (ref 30.0–36.0)
MCV: 94.1 fL (ref 80.0–100.0)
Platelets: 266 10*3/uL (ref 150–400)
RBC: 3.04 MIL/uL — ABNORMAL LOW (ref 4.22–5.81)
RDW: 13.7 % (ref 11.5–15.5)
WBC: 12.3 10*3/uL — ABNORMAL HIGH (ref 4.0–10.5)
nRBC: 0 % (ref 0.0–0.2)

## 2023-04-08 LAB — MAGNESIUM: Magnesium: 2 mg/dL (ref 1.7–2.4)

## 2023-04-08 MED ORDER — SIMETHICONE 40 MG/0.6ML PO SUSP
40.0000 mg | Freq: Four times a day (QID) | ORAL | Status: DC
  Administered 2023-04-08 – 2023-04-12 (×15): 40 mg via ORAL
  Filled 2023-04-08 (×20): qty 0.6

## 2023-04-08 MED ORDER — BISACODYL 10 MG RE SUPP
10.0000 mg | Freq: Once | RECTAL | Status: AC
  Administered 2023-04-08: 10 mg via RECTAL
  Filled 2023-04-08: qty 1

## 2023-04-08 NOTE — Progress Notes (Signed)
PROGRESS NOTE    Luis Marquez  GMW:[id number] DOB: 1961/04/14 DOA: 04/06/2023 PCP: Claiborne Rigg, NP    Brief Narrative:   Patient is a 62 years old male with past medical history of alcohol abuse, COPD, paroxysmal atrial fibrillation, systolic heart failure and previous history of GI bleed, diverticulosis presented to hospital with abdominal pain for few weeks.  He did have a history of diarrhea with blood in the stool which stopped after using Imodium which was couple of weeks back.  In the ED, patient was hemodynamically stable.  CBC showed hemoglobin of 11.2, WBC at 15.2 with creatinine of 1.6 potassium was elevated at 5.9.  Alcohol level was less than 10.  Lipase was elevated at 1685.  Patient had a CT scan which showed focal 8 mm area of hyperenhancement posterior to the pancreatic head and uncinate which was suspected of pseudoaneurysm.  There was mention of peripancreatic fat stranding towards the pancreatic groove.  Patient was then considered for admission to the hospital for further evaluation and treatment for acute pancreatitis..  Assessment and plan.  Acute pancreatitis.   History of alcohol abuse. Still mildly nauseated on clears.  Will continue clears for now.  Had flatulence and abdominal distention with small bowel movement yesterday.  Will try 1 more Dulcolax suppository today.   Continue, IV fluids, antiemetics, analgesics, IV PPI twice daily, simethicone liquid..  Lipase has trended down to 301.  Will continue to monitor.  Ultrasound of the right upper quadrant with normal-appearing gallbladder.  Concerns for pancreatic pseudoaneurysm. As per CT scan of the abdomen.  Vascular surgery was noted from the ED who recommended IR evaluation.  IR was consulted and recommended CTA if bleeding were to happen.  At this time patient has not had any further bowel movements or GI bleed.Marland Kitchen  History of COPD/emphysema..   Continue bronchodilators.  Appears to be  compensated.  Hypomagnesemia.  Improved after replacement.  Latest magnesium of 2.0  Hypocalcemia received calcium gluconate.  Patient does have a low albumin at 2.1.  History of paroxysmal atrial fibrillation. On Eliquis as outpatient.  Holding at this time due to concern for GI bleed.  Hold metoprolol for now since  blood pressure is still marginally low.  Chronic systolic congestive heart failure.   Compensated at this time.  On IV fluids.  Start clears.  Creatinine of 1.0 today.  Continue intake and output charting, Daily weights.  Essential hypertension.  Patient is on losartan and metoprolol at home.  Will continue to hold.  Mild hyponatremia.  Will continue to monitor.  Latest sodium of 133.   DVT prophylaxis: SCDs Start: 04/06/23 1733   Code Status:     Code Status: Full Code  Disposition: Home likely in 1 to 2 days when clinically improved.  Status is: Inpatient Remains inpatient appropriate because: Acute pancreatitis, IV fluids, monitor for GI bleed.   Family Communication: None at bedside  Consultants:  None  Procedures:  None  Antimicrobials:  None  Anti-infectives (From admission, onward)    None        Subjective: Today, patient was seen and examined bedside.  Complains of mild abdominal distention, nausea but no vomiting.  Had some gas and had small bowel movement yesterday.  No bloody bowel movement.  Mild abdominal discomfort.  Does not have any appetite.  Objective: Vitals:   04/07/23 2115 04/08/23 0500 04/08/23 0754 04/08/23 1100  BP:  124/85 99/73 99/75   Pulse:   96 94  Resp:  15  18 18  Temp:  99.1 F (37.3 C) 99.1 F (37.3 C) 98.2 F (36.8 C)  TempSrc:  Oral Oral Oral  SpO2: 95% 95% 95% 94%  Weight:  96.1 kg    Height:        Intake/Output Summary (Last 24 hours) at 04/08/2023 1148 Last data filed at 04/08/2023 1000 Gross per 24 hour  Intake 1911.32 ml  Output 750 ml  Net 1161.32 ml   Filed Weights   04/06/23 1703 04/07/23  0500 04/08/23 0500  Weight: 94.1 kg 95.3 kg 96.1 kg    Physical Examination: Body mass index is 27.95 kg/m.   General:  Average built, not in obvious distress HENT:   No scleral pallor or icterus noted. Oral mucosa is moist.  Chest:  Clear breath sounds.  . No crackles or wheezes.  CVS: S1 &S2 heard. No murmur.  Regular rate and rhythm. Abdomen: Soft, mild tenderness over the epigastric region, mild distention of the abdomen noted,  Bowel sounds are heard.   Extremities: No cyanosis, clubbing or edema.  Peripheral pulses are palpable. Psych: Alert, awake and oriented, normal mood CNS:  No cranial nerve deficits.  Power equal in all extremities.   Skin: Warm and dry.  No rashes noted.  Data Reviewed:   CBC: Recent Labs  Lab 04/06/23 1136 04/06/23 1151 04/07/23 1107 04/08/23 0243  WBC 15.2*  --  11.3* 12.3*  NEUTROABS 13.8*  --   --   --   HGB 11.2* 11.6* 9.4* 9.4*  HCT 34.3* 34.0* 29.0* 28.6*  MCV 91.5  --  92.1 94.1  PLT 375  --  253 266    Basic Metabolic Panel: Recent Labs  Lab 04/06/23 1136 04/06/23 1151 04/07/23 1107 04/08/23 0243  NA 136 135 133* 133*  K 5.4* 5.9* 3.8 4.8  CL 103 106 107 104  CO2 19*  --  19* 21*  GLUCOSE 132* 134* 86 92  BUN 29* 41* 25* 18  CREATININE 1.64* 1.60* 1.16 1.07  CALCIUM 8.4*  --  6.9* 7.7*  MG  --   --  1.5* 2.0    Liver Function Tests: Recent Labs  Lab 04/06/23 1136 04/07/23 1107 04/08/23 0243  AST 34 28 32  ALT 21 18 18   ALKPHOS 129* 124 115  BILITOT 1.0 0.9 1.1  PROT 6.4* 5.6* 5.4*  ALBUMIN 2.5* 2.1* 2.1*     Radiology Studies: US ABDOMEN LIMITED RUQ (LIVER/GB)  Result Date: 04/06/2023 CLINICAL DATA:  Pancreatitis EXAM: ULTRASOUND ABDOMEN LIMITED RIGHT UPPER QUADRANT COMPARISON:  CT abdomen and pelvis dated April 06, 2019 FINDINGS: Gallbladder: No gallstones. No gallbladder wall thickening. No sonographic Murphy sign noted by sonographer. Echogenic linear opacity located near gallbladder neck is due to  redundant cystic duct when compared with same day CT. Common bile duct: Diameter: 6.5 mm Liver: No focal lesion identified. Increased parenchymal echogenicity. Portal vein is patent on color Doppler imaging with normal direction of blood flow towards the liver. Other: Trace ascites. IMPRESSION: 1. Normal sonographic appearance of the gallbladder. 2. Mildly dilated common bile duct. 3. Hepatic steatosis. 4. Trace ascites. Electronically Signed   By: Allegra Lai M.D.   On: 04/06/2023 19:36   CT ABDOMEN PELVIS W CONTRAST  Result Date: 04/06/2023 CLINICAL DATA:  Emesis and diarrhea.  Bloody stool. EXAM: CT ABDOMEN AND PELVIS WITH CONTRAST TECHNIQUE: Multidetector CT imaging of the abdomen and pelvis was performed using the standard protocol following bolus administration of intravenous contrast. RADIATION DOSE REDUCTION: This exam was  performed according to the departmental dose-optimization program which includes automated exposure control, adjustment of the mA and/or kV according to patient size and/or use of iterative reconstruction technique. CONTRAST:  75mL OMNIPAQUE IOHEXOL 350 MG/ML SOLN COMPARISON:  12/09/2021 CT.  X-ray 11/26/2022 FINDINGS: Lower chest: Lung bases are clear. No pleural effusion. Small hiatal hernia. There is a also a small amount of fluid adjacent to the hernia sac. Hepatobiliary: Fatty liver infiltration. No space-occupying liver lesion. Patent portal vein. Gallbladder is dilated. Pancreas: There is peripancreatic fat stranding. There is some areas of fluid towards the pancreatic head in the pancreatic groove with the duodenal. Please correlate for clinical evidence of pancreatitis. Grossly preserved pancreatic enhancement. However there is a area of presumed hyperenhancement along the posterior aspect of the pancreatic head/uncinate measuring 8 mm on series 3, image 30 which does not persistent same density on delayed and has a density of vasculature worrisome for a developing  pseudoaneurysm. Small amount of surrounding fluid or thrombus suggested as well on series 3, image 30. Spleen: Normal in size without focal abnormality. Adrenals/Urinary Tract: Diffuse mild thickening of the adrenal glands is stable, nonspecific. Mild parenchymal atrophy. Bosniak 1 and 2 renal cysts are stable. No imaging follow-up. The ureters have normal course and caliber extending down to the bladder. Preserved contours of the urinary bladder. Stomach/Bowel: On this non oral contrast exam large bowel has a normal course and caliber with scattered colonic diverticula. The stomach and small bowel are nondilated. Normal appendix. Vascular/Lymphatic: Diffuse vascular calcifications. Normal caliber IVC. There is no specific abnormal lymph node enlargement identified in the abdomen and pelvis. Once again there aneurysm formation of the abdominal aorta measuring up to 3.3 cm previously and today 3.7 cm when measured in a similar fashion. There appears to be a component of the penetrating ulcer into the plaque and thrombus anterolateral on axial series 3, image 35. Additional focus more caudal as well on image 42. Please see coronal series 6, image 72. This has more caudal focus appears new from the prior CT scan. Appearance Reproductive: Prostate is unremarkable. Other: Small fat containing right inguinal hernia. Trace ascites in the right upper quadrant near the liver. No free intra-air. Musculoskeletal: Osteopenia with degenerative changes. Streak artifact related to the patient's dynamic right hip screw at the edge of the imaging field. There also pars defects at L5 with a at most trace listhesis. Stable mild compression of T12 IMPRESSION: Peripancreatic fat stranding identified including significant stranding towards the pancreatic groove. Please correlate for clinical evidence of pancreatitis. No well-defined fluid collections. However there is a focal 8 mm area of hyperenhancement posterior to the pancreatic head  and uncinate worrisome for developing pseudoaneurysm. Recommend further evaluation. Fatty liver infiltration. Dilated gallbladder. Follow up ultrasound is recommended when appropriate to assess for stones. Trace ascites. Increasing size abdominal aortic aneurysm now measuring up to 3.7 cm. There is also increasing changes of a penetrating enhancing ulcer along the mural plaque and thrombus along the lower abdominal aorta. Recommend vascular surgery consultation when appropriate. Colonic diverticula. Critical Value/emergent results were called by telephone at the time of interpretation on 04/06/2023 at 12:32 pm to provider Pricilla Loveless , who verbally acknowledged these results. Electronically Signed   By: Karen Kays M.D.   On: 04/06/2023 15:38   DG Chest Portable 1 View  Result Date: 04/06/2023 CLINICAL DATA:  Dyspnea.  Abdominal pain. EXAM: PORTABLE CHEST 1 VIEW COMPARISON:  02/21/2022. FINDINGS: Bilateral lung fields are clear. Bilateral costophrenic angles are clear.  Normal cardio-mediastinal silhouette. No acute osseous abnormalities. The soft tissues are within normal limits. No free air under the domes of diaphragm. IMPRESSION: 1. No active disease. Electronically Signed   By: Jules Schick M.D.   On: 04/06/2023 14:02      LOS: 2 days    Joycelyn Das, MD [facility] Available via Epic secure chat 7am-7pm After these hours, please refer to coverage provider listed on [email or web address] 04/08/2023, 11:48 AM

## 2023-04-08 NOTE — Plan of Care (Signed)

## 2023-04-09 DIAGNOSIS — E871 Hypo-osmolality and hyponatremia: Secondary | ICD-10-CM | POA: Insufficient documentation

## 2023-04-09 DIAGNOSIS — I700 Atherosclerosis of aorta: Secondary | ICD-10-CM | POA: Insufficient documentation

## 2023-04-09 DIAGNOSIS — J439 Emphysema, unspecified: Secondary | ICD-10-CM

## 2023-04-09 DIAGNOSIS — I5022 Chronic systolic (congestive) heart failure: Secondary | ICD-10-CM

## 2023-04-09 DIAGNOSIS — I480 Paroxysmal atrial fibrillation: Secondary | ICD-10-CM

## 2023-04-09 DIAGNOSIS — K859 Acute pancreatitis without necrosis or infection, unspecified: Secondary | ICD-10-CM | POA: Diagnosis not present

## 2023-04-09 DIAGNOSIS — N179 Acute kidney failure, unspecified: Secondary | ICD-10-CM

## 2023-04-09 DIAGNOSIS — I10 Essential (primary) hypertension: Secondary | ICD-10-CM

## 2023-04-09 LAB — COMPREHENSIVE METABOLIC PANEL
ALT: 14 U/L (ref 0–44)
AST: 21 U/L (ref 15–41)
Albumin: 1.6 g/dL — ABNORMAL LOW (ref 3.5–5.0)
Alkaline Phosphatase: 108 U/L (ref 38–126)
Anion gap: 8 (ref 5–15)
BUN: 11 mg/dL (ref 8–23)
CO2: 19 mmol/L — ABNORMAL LOW (ref 22–32)
Calcium: 6.6 mg/dL — ABNORMAL LOW (ref 8.9–10.3)
Chloride: 103 mmol/L (ref 98–111)
Creatinine, Ser: 0.85 mg/dL (ref 0.61–1.24)
GFR, Estimated: >60 mL/min (ref >60)
Glucose, Bld: 66 mg/dL — ABNORMAL LOW (ref 70–99)
Potassium: 3.8 mmol/L (ref 3.5–5.1)
Sodium: 130 mmol/L — ABNORMAL LOW (ref 135–145)
Total Bilirubin: 0.7 mg/dL (ref 0.3–1.2)
Total Protein: 4.6 g/dL — ABNORMAL LOW (ref 6.5–8.1)

## 2023-04-09 LAB — CBC
HCT: 23.9 % — ABNORMAL LOW (ref 39.0–52.0)
Hemoglobin: 7.8 g/dL — ABNORMAL LOW (ref 13.0–17.0)
MCH: 31 pg (ref 26.0–34.0)
MCHC: 32.6 g/dL (ref 30.0–36.0)
MCV: 94.8 fL (ref 80.0–100.0)
Platelets: 226 10*3/uL (ref 150–400)
RBC: 2.52 MIL/uL — ABNORMAL LOW (ref 4.22–5.81)
RDW: 13.8 % (ref 11.5–15.5)
WBC: 10.6 10*3/uL — ABNORMAL HIGH (ref 4.0–10.5)
nRBC: 0 % (ref 0.0–0.2)

## 2023-04-09 LAB — MAGNESIUM: Magnesium: 1.7 mg/dL (ref 1.7–2.4)

## 2023-04-09 MED ORDER — APIXABAN 5 MG PO TABS
5.0000 mg | ORAL_TABLET | Freq: Two times a day (BID) | ORAL | Status: DC
  Administered 2023-04-09 – 2023-04-14 (×11): 5 mg via ORAL
  Filled 2023-04-09 (×11): qty 1

## 2023-04-09 MED ORDER — CALCIUM GLUCONATE-NACL 1-0.675 GM/50ML-% IV SOLN
1.0000 g | Freq: Once | INTRAVENOUS | Status: AC
  Administered 2023-04-09: 1000 mg via INTRAVENOUS
  Filled 2023-04-09: qty 50

## 2023-04-09 NOTE — Assessment & Plan Note (Signed)
Supplemented

## 2023-04-09 NOTE — Assessment & Plan Note (Signed)
Supplemented, now corrects with albumin

## 2023-04-09 NOTE — Assessment & Plan Note (Signed)
HR controlled - Resume Eliquis - Hold metoprolol given soft BP

## 2023-04-09 NOTE — Progress Notes (Signed)
Progress Note   Patient: Luis Marquez [id number]:[id number] DOB: 1960-09-23 DOA: 04/06/2023     3 DOS: the patient was seen and examined on 04/09/2023 at 8:09 AM      Brief hospital course: Luis Marquez is a 62 y.o. M with HFrEF EF 20-25%, pAF on Eliquis, COPD, alcohol use and hx DTs, and CAD who presented with epigastric pain.  In the ER, CT showed pancreatitis, possible pseudoaneurysm, notable due to reports of recent hematochezia.  Lipase >1000.  Admitted on fluids.     Assessment and Plan: * Acute pancreatitis Pseudoaneurysm Hematochezia Lipase trending down, pain and nausea improving.  Did well with clears yesterday.  Having BMs.  With regard to the pseudoaneurysm, he has had no observed bleeding at all in the hospital.   - Advance to full liquids - Stop IV fluids  - Resume Eliquis    Hyponatremia Mild, asymptomatic - Stop fluids  Hypomagnesemia Supplemented  Aortic atherosclerosis (HCC) - Continue Lipitor - Outpatient vascular surgery consultation   Paroxysmal atrial fibrillation (HCC) HR controlled - Resume Eliquis - Hold metoprolol given soft BP  Chronic systolic CHF (congestive heart failure) (HCC) Appears euvolemic. Not on diuretics at home.  Outpatient echo pending. - Hold fluids  AKI (acute kidney injury) (HCC) Had AKI with Cr up to 3 mg/dL 2 weeks PTA in the setting of ARB use and diarrhea and hypotension.  The diarrhea is resolved. BP is improved - Hold ARB  Essential hypertension BP soft - Hold losartan  Emphysema lung (HCC) - Continue ICS/LABA  Hypocalcemia Corrects to only 8.5 - Supplement Ca again  Anemia Hgb trending down.  Baseline normal 3 months ago ~13 g/dL.  Today down to 7.8  Iron replete. No observed bleeding here.and so I have low suspicion for bleeding pseudoaneurysm. - Trend Hgb - Cautiously restart Eliquis          Subjective: Patient's abdominal pain is improving, he is passing stool and flatus, some discomfort  but not too much, did well with clears without nausea or pain.  No hematochezia or melena at all.     Physical Exam: BP 107/66 (BP Location: Left Arm)   Pulse 87   Temp 98.7 F (37.1 C) (Oral)   Resp 20   Ht 6\' 1"  (1.854 m)   Wt 98.1 kg   SpO2 96%   BMI 28.53 kg/m   Adult male, sitting in bed, interactive and appropriate RRR, no murmurs, no peripheral edema Respiratory rate normal, lungs clear without rales or wheezes Abdomen with mild tenderness diffusely but no guarding, no rebound Attention normal, affect appropriate, judgment and insight appear normal    Data Reviewed: Basic metabolic panel shows worsening hyponatremia, stable renal function, hypocalcemia White blood cell count improved, hemoglobin down to 7.8    Family Communication:     Disposition: Status is: Inpatient         Author: Alberteen Sam, MD 04/09/2023 3:41 PM  For on call review [email or web address].

## 2023-04-09 NOTE — Assessment & Plan Note (Signed)
Appears euvolemic. Not on diuretics at home.  Outpatient echo pending. - Hold fluids

## 2023-04-09 NOTE — Assessment & Plan Note (Signed)
Mild, asymptomatic - Stop fluids

## 2023-04-09 NOTE — Assessment & Plan Note (Signed)
Blood pressure normal - Hold losartan preop, resume tomorrow

## 2023-04-09 NOTE — Assessment & Plan Note (Addendum)
Had AKI with Cr up to 3 mg/dL 2 weeks PTA in the setting of ARB use and diarrhea and hypotension.  The diarrhea is resolved. BP is improved - Hold ARB

## 2023-04-09 NOTE — Assessment & Plan Note (Signed)
Pseudoaneurysm Hematochezia Lipase trending down, pain and nausea improving.  Did well with clears yesterday.  Having BMs.  With regard to the pseudoaneurysm, he has had no observed bleeding at all in the hospital.   - Advance to full liquids - Stop IV fluids  - Resume Eliquis

## 2023-04-09 NOTE — Assessment & Plan Note (Signed)
-   Continue Lipitor - Outpatient vascular surgery consultation

## 2023-04-09 NOTE — Assessment & Plan Note (Signed)
Pseudoaneurysm Hematochezia Baseline normal 3 months ago ~13 g/dL.  Here, down considerably.  Trending down over last week.    Eliquis resumed 48 hours ago, no melena or hematochezia since then.  Hgb very slowly trending down.  Iron replete. No observed bleeding here.and so I have low suspicion for bleeding pseudoaneurysm. - Trend Hgb - Continue Eliquis

## 2023-04-09 NOTE — Assessment & Plan Note (Signed)
-   Continue ICS/LABA

## 2023-04-10 DIAGNOSIS — N179 Acute kidney failure, unspecified: Secondary | ICD-10-CM | POA: Diagnosis not present

## 2023-04-10 DIAGNOSIS — J439 Emphysema, unspecified: Secondary | ICD-10-CM | POA: Diagnosis not present

## 2023-04-10 DIAGNOSIS — I480 Paroxysmal atrial fibrillation: Secondary | ICD-10-CM | POA: Diagnosis not present

## 2023-04-10 DIAGNOSIS — K859 Acute pancreatitis without necrosis or infection, unspecified: Secondary | ICD-10-CM | POA: Diagnosis not present

## 2023-04-10 LAB — BASIC METABOLIC PANEL
Anion gap: 7 (ref 5–15)
BUN: 11 mg/dL (ref 8–23)
CO2: 20 mmol/L — ABNORMAL LOW (ref 22–32)
Calcium: 7.2 mg/dL — ABNORMAL LOW (ref 8.9–10.3)
Chloride: 101 mmol/L (ref 98–111)
Creatinine, Ser: 0.95 mg/dL (ref 0.61–1.24)
GFR, Estimated: >60 mL/min (ref >60)
Glucose, Bld: 115 mg/dL — ABNORMAL HIGH (ref 70–99)
Potassium: 3.8 mmol/L (ref 3.5–5.1)
Sodium: 128 mmol/L — ABNORMAL LOW (ref 135–145)

## 2023-04-10 LAB — CBC
HCT: 23.3 % — ABNORMAL LOW (ref 39.0–52.0)
Hemoglobin: 7.6 g/dL — ABNORMAL LOW (ref 13.0–17.0)
MCH: 30.8 pg (ref 26.0–34.0)
MCHC: 32.6 g/dL (ref 30.0–36.0)
MCV: 94.3 fL (ref 80.0–100.0)
Platelets: 258 10*3/uL (ref 150–400)
RBC: 2.47 MIL/uL — ABNORMAL LOW (ref 4.22–5.81)
RDW: 13.4 % (ref 11.5–15.5)
WBC: 9.6 10*3/uL (ref 4.0–10.5)
nRBC: 0 % (ref 0.0–0.2)

## 2023-04-10 LAB — GLUCOSE, CAPILLARY: Glucose-Capillary: 129 mg/dL — ABNORMAL HIGH (ref 70–99)

## 2023-04-10 MED ORDER — POLYETHYLENE GLYCOL 3350 17 G PO PACK
17.0000 g | PACK | Freq: Two times a day (BID) | ORAL | Status: DC
  Administered 2023-04-10 – 2023-04-14 (×7): 17 g via ORAL
  Filled 2023-04-10 (×11): qty 1

## 2023-04-10 MED ORDER — BISACODYL 10 MG RE SUPP
10.0000 mg | Freq: Every day | RECTAL | Status: DC | PRN
  Administered 2023-04-11: 10 mg via RECTAL
  Filled 2023-04-10: qty 1

## 2023-04-10 NOTE — Progress Notes (Signed)
Progress Note   Patient: Luis Marquez [id number]:[id number] DOB: [birth year] DOA: 04/06/2023     4 DOS: the patient was seen and examined on 04/10/2023        Brief hospital course: Mr. Bartz is a 62 y.o. M with HFrEF EF 20-25%, pAF on Eliquis, COPD, alcohol use and hx DTs, and CAD who presented with epigastric pain.  In the ER, CT showed pancreatitis, possible pseudoaneurysm, notable due to reports of recent hematochezia.  Lipase >1000.  Admitted on fluids.     Assessment and Plan: * Acute pancreatitis Pseudoaneurysm Hematochezia More pain today with soft diet. - Regress to full liquids - Continue Eliquis    Anemia Hgb slightly lower, no clinical bleeding.   - Trend Hgb - Continue Eliquis   Hyponatremia Mild asymptomatic  Aortic atherosclerosis (HCC) - Continue lipitor - Outpatient vascular surgery consultation   Paroxysmal atrial fibrillation (HCC) HR elevated with ambulation due to anemia - Continue Eliquis - Hold metoprolol given low BP   Chronic systolic CHF (congestive heart failure) (HCC) Appears euvolemic. Not on diuretics at home.  Outpatient echo pending.  AKI (acute kidney injury) (HCC) Recent AKI, now better - Hold ARB  Essential hypertension BP soft - Hold metop, losartan  Emphysema lung (HCC) - Continue ICS/LABA  Hypocalcemia Resolved            Subjective: Patient soft diet this morning, but has had more pain since then, no nausea or vomiting.  No fever.  No melena.  No blood in the stool.  Tachycardic with ambulating he says.     Physical Exam: BP 100/69 (BP Location: Left Arm)   Pulse 97   Temp 98.5 F (36.9 C) (Oral)   Resp 16   Ht 6\' 1"  (1.854 m)   Wt 99 kg   SpO2 95%   BMI 28.80 kg/m   Obese adult male, lying in bed, weak and tired Tachycardic, irregular, no murmurs, no peripheral edema Respiratory normal, lungs clear without rales or wheezes Abdomen protuberant, somewhat distended, tender diffusely, no  guarding Attention normal, affect appropriate, judgment insight appear normal    Data Reviewed: Patient advised panel shows sodium 128, creatinine normal CBC shows hemoglobin slightly down to 7.6 Hemoglobin A1c 6.4%  Family Communication: None    Disposition: Status is: Inpatient         Author: Alberteen Sam, MD 04/10/2023 6:12 PM  For on call review [email or web address].

## 2023-04-11 DIAGNOSIS — J439 Emphysema, unspecified: Secondary | ICD-10-CM | POA: Diagnosis not present

## 2023-04-11 DIAGNOSIS — I480 Paroxysmal atrial fibrillation: Secondary | ICD-10-CM | POA: Diagnosis not present

## 2023-04-11 DIAGNOSIS — N179 Acute kidney failure, unspecified: Secondary | ICD-10-CM | POA: Diagnosis not present

## 2023-04-11 DIAGNOSIS — K859 Acute pancreatitis without necrosis or infection, unspecified: Secondary | ICD-10-CM | POA: Diagnosis not present

## 2023-04-11 LAB — CBC
HCT: 22.4 % — ABNORMAL LOW (ref 39.0–52.0)
Hemoglobin: 7.5 g/dL — ABNORMAL LOW (ref 13.0–17.0)
MCH: 31.1 pg (ref 26.0–34.0)
MCHC: 33.5 g/dL (ref 30.0–36.0)
MCV: 92.9 fL (ref 80.0–100.0)
Platelets: 278 10*3/uL (ref 150–400)
RBC: 2.41 MIL/uL — ABNORMAL LOW (ref 4.22–5.81)
RDW: 13.5 % (ref 11.5–15.5)
WBC: 8.9 10*3/uL (ref 4.0–10.5)
nRBC: 0 % (ref 0.0–0.2)

## 2023-04-11 LAB — COMPREHENSIVE METABOLIC PANEL
ALT: 20 U/L (ref 0–44)
AST: 29 U/L (ref 15–41)
Albumin: 1.7 g/dL — ABNORMAL LOW (ref 3.5–5.0)
Alkaline Phosphatase: 126 U/L (ref 38–126)
Anion gap: 7 (ref 5–15)
BUN: 8 mg/dL (ref 8–23)
CO2: 22 mmol/L (ref 22–32)
Calcium: 7.7 mg/dL — ABNORMAL LOW (ref 8.9–10.3)
Chloride: 100 mmol/L (ref 98–111)
Creatinine, Ser: 0.85 mg/dL (ref 0.61–1.24)
GFR, Estimated: >60 mL/min (ref >60)
Glucose, Bld: 100 mg/dL — ABNORMAL HIGH (ref 70–99)
Potassium: 3.9 mmol/L (ref 3.5–5.1)
Sodium: 129 mmol/L — ABNORMAL LOW (ref 135–145)
Total Bilirubin: 0.9 mg/dL (ref 0.3–1.2)
Total Protein: 4.7 g/dL — ABNORMAL LOW (ref 6.5–8.1)

## 2023-04-11 MED ORDER — SENNOSIDES-DOCUSATE SODIUM 8.6-50 MG PO TABS
1.0000 | ORAL_TABLET | Freq: Two times a day (BID) | ORAL | Status: DC | PRN
  Administered 2023-04-11 – 2023-04-23 (×11): 1 via ORAL
  Filled 2023-04-11 (×11): qty 1

## 2023-04-11 NOTE — Progress Notes (Signed)
Progress Note   Luis Marquez ZOX:[id number] DOB: 10/31/1960 DOA: 04/06/2023     5 DOS: the Luis Marquez was seen and examined on 04/11/2023 at 12:50PM      Brief hospital course: Luis Marquez is a 62 y.o. M with HFrEF EF 20-25%, pAF on Eliquis, COPD, alcohol use and hx DTs, and CAD who presented with epigastric pain.  In the ER, CT showed pancreatitis, possible pseudoaneurysm, notable due to reports of recent hematochezia.  Lipase >1000.  Admitted on fluids.     Assessment and Plan: * Acute pancreatitis Lipase trending down, pain and nausea improving.  Hungry.  Still passing gas but no BM. - Advance to soft diet     Anemia Pseudoaneurysm Hematochezia Baseline normal 3 months ago ~13 g/dL.  Here, down considerably.  Trending down over last week.    Eliquis resumed 48 hours ago, no melena or hematochezia since then.  Hgb very slowly trending down.  Iron replete. No observed bleeding here.and so I have low suspicion for bleeding pseudoaneurysm. - Trend Hgb - Continue Eliquis    Hyponatremia Mild, asymptomatic - Stop fluids  Hypomagnesemia Supplemented  Aortic atherosclerosis (HCC) - Continue Lipitor - Outpatient vascular surgery consultation   Paroxysmal atrial fibrillation (HCC) HR slightly elevated - Continue Eliquis - Hold metoprolol given soft BP, may need to resume soon given tachycardia  Chronic systolic CHF (congestive heart failure) (HCC) Appears euvolemic. Not on diuretics at home.  Outpatient echo pending. - Hold fluids  AKI (acute kidney injury) (HCC) Had AKI with Cr up to 3 mg/dL 2 weeks PTA in the setting of ARB use and diarrhea and hypotension.  The diarrhea is resolved. BP is improved - Hold ARB  Essential hypertension BP normal off meds - Hold losartan  Emphysema lung (HCC) - Continue ICS/LABA  Hypocalcemia Supplemented, now corrects with albumin          Subjective: Luis Marquez is hungry, Luis Marquez has no bowel movements but in the  last day Luis Marquez is walking well, has had no melena or hematochezia, and is wanting to advance his diet.  Luis Marquez has some belly distention and generalized tightness, but nothing severe     Physical Exam: BP 116/82 (BP Location: Right Arm)   Pulse (!) 102   Temp 98 F (36.7 C) (Oral)   Resp 18   Ht 6\' 1"  (1.854 m)   Wt 99 kg   SpO2 100%   BMI 28.80 kg/m   Adult male, sitting in bed, interactive and appropriate RRR, no murmurs, no peripheral edema Respiratory rate normal, lungs clear without rales or wheezes Abdomen with mild tenderness diffusely but no guarding, no rebound Attention normal, affect appropriate, judgment and insight appear normal    Data Reviewed: Sodium up to 129, potassium, creatinine, calcium normal Hemoglobin around 7.5  Family Communication:     Disposition: Status is: Inpatient The Luis Marquez was admitted with pancreatitis  From the standpoint of pancreatitis, Luis Marquez has improved and is tolerating full liquid diet we will advance to soft diet, today.  However Luis Marquez has also got progressive anemia, with unclear cause.        Author: Alberteen Sam, MD 04/11/2023 6:00 PM  For on call review [email or web address].

## 2023-04-11 NOTE — Plan of Care (Signed)

## 2023-04-12 DIAGNOSIS — K859 Acute pancreatitis without necrosis or infection, unspecified: Secondary | ICD-10-CM | POA: Diagnosis not present

## 2023-04-12 LAB — CBC
HCT: 25.4 % — ABNORMAL LOW (ref 39.0–52.0)
Hemoglobin: 8.3 g/dL — ABNORMAL LOW (ref 13.0–17.0)
MCH: 29.7 pg (ref 26.0–34.0)
MCHC: 32.7 g/dL (ref 30.0–36.0)
MCV: 91 fL (ref 80.0–100.0)
Platelets: 385 10*3/uL (ref 150–400)
RBC: 2.79 MIL/uL — ABNORMAL LOW (ref 4.22–5.81)
RDW: 13.5 % (ref 11.5–15.5)
WBC: 9.7 10*3/uL (ref 4.0–10.5)
nRBC: 0 % (ref 0.0–0.2)

## 2023-04-12 LAB — BASIC METABOLIC PANEL
Anion gap: 8 (ref 5–15)
BUN: 8 mg/dL (ref 8–23)
CO2: 23 mmol/L (ref 22–32)
Calcium: 8 mg/dL — ABNORMAL LOW (ref 8.9–10.3)
Chloride: 98 mmol/L (ref 98–111)
Creatinine, Ser: 0.84 mg/dL (ref 0.61–1.24)
GFR, Estimated: >60 mL/min (ref >60)
Glucose, Bld: 89 mg/dL (ref 70–99)
Potassium: 4.1 mmol/L (ref 3.5–5.1)
Sodium: 129 mmol/L — ABNORMAL LOW (ref 135–145)

## 2023-04-12 MED ORDER — FUROSEMIDE 10 MG/ML IJ SOLN
20.0000 mg | Freq: Once | INTRAMUSCULAR | Status: AC
  Administered 2023-04-12: 20 mg via INTRAVENOUS
  Filled 2023-04-12: qty 2

## 2023-04-12 MED ORDER — SIMETHICONE 40 MG/0.6ML PO SUSP: 20.0000 mg | Freq: Four times a day (QID) | ORAL | Status: DC | PRN

## 2023-04-12 MED ORDER — LACTULOSE 10 GM/15ML PO SOLN
20.0000 g | Freq: Two times a day (BID) | ORAL | Status: DC
  Administered 2023-04-12 – 2023-04-14 (×5): 20 g via ORAL
  Filled 2023-04-12 (×5): qty 30

## 2023-04-12 MED ORDER — FUROSEMIDE 10 MG/ML IJ SOLN
40.0000 mg | Freq: Once | INTRAMUSCULAR | Status: DC
Start: 2023-04-12 — End: 2023-04-12

## 2023-04-12 MED ORDER — MORPHINE SULFATE (PF) 2 MG/ML IV SOLN
1.0000 mg | INTRAVENOUS | Status: DC | PRN
  Administered 2023-04-12 – 2023-04-24 (×74): 1 mg via INTRAVENOUS
  Filled 2023-04-12 (×75): qty 1

## 2023-04-12 NOTE — Progress Notes (Signed)
Heart Failure Navigator Progress Note  Assessed for Heart & Vascular TOC clinic readiness.  Patient does not meet criteria due to Advanced Heart Failure Team patient of Dr. Bensimhon.   Navigator will sign off at this time.   Lucilia Yanni, BSN, RN Heart Failure Nurse Navigator Secure Chat Only   

## 2023-04-12 NOTE — Plan of Care (Signed)

## 2023-04-12 NOTE — Progress Notes (Signed)
PROGRESS NOTE    Luis Marquez  VHQ:[id number] DOB: 18-Oct-1960 DOA: 04/06/2023 PCP: Claiborne Rigg, NP  Luis Marquez is a 62 y.o. M with HFrEF EF 20-25%, pAF on Eliquis, COPD, alcohol use and hx DTs, and CAD who presented with epigastric pain. History of intermittent hematochezia for months, had a colonoscopy 5/24, multiple polyps removed In the ER, CT showed pancreatitis, possible pseudoaneurysm,  abdominal aortic aneurysm now measuring up to 3.7 cm. There is also increasing changes of a penetrating enhancing ulcer along the mural plaque and thrombus along the lower abdominal Aorta.  Lipase >1000.   Subjective: -Patient seen and examined, Dr. Sheryn Bison notes from last week reviewed -Mild abdominal distention and complains of constipation, tolerating soft diet now, denies any melena or hematochezia  Assessment and Plan:   Acute pancreatitis ?  Developing pseudoaneurysm -Prior heavy alcoholism, quit few weeks ago, ultrasound negative for cholelithiasis -CT 9/19  noted focal 8 mm area of hyperenhancement posterior to the pancreatic head and uncinate worrisome for developing pseudoaneurysm. -Vascular recommended IR eval, seen by interventional radiology, felt to be hemodynamically stable initially, recommended CTA if he shows signs of bleeding or becomes hemodynamically unstable -Clinically improving, tolerating diet -Add laxatives, continue supportive care  Anemia Hematochezia Baseline normal 3 months ago ~13 g/dL.   -Hemoglobin has trended down in the setting of likely hemodilution, history of hematochezia 10 days prior to admission, recent colonoscopy 6/24 noted multiple polyps which were removed -Eliquis resumed by my partner 3 days ago, no active bleeding, hemoglobin stabilizing  AAA with ulcer and mural plaque, thrombus - Will discuss with vascular to review imaging, -Control BP, fortunately has been in 100-110 range -Continue apixaban, add statin  Hyponatremia Mild,  asymptomatic - Stop fluids  Hypomagnesemia Supplemented  Paroxysmal atrial fibrillation (HCC) HR controlled - Continue Eliquis - Hold metoprolol given soft BP  Chronic systolic CHF (congestive heart failure) (HCC) -Will repeat echo, IV Lasix X1  AKI (acute kidney injury) (HCC) Had AKI with Cr up to 3 mg/dL 2 weeks PTA in the setting of ARB use and diarrhea and hypotension.  Essential hypertension BP normal off meds - Hold losartan  Emphysema lung (HCC) - Continue ICS/LABA  Hypocalcemia Supplemented, now corrects with albumin  Severe hypoalbuminemia -Suspect chronic liver disease from EtOH abuse  DVT prophylaxis: Eliquis Code Status: Full code Family Communication: None present Disposition Plan: Home likely 1 to 2 days  Consultants:    Procedures:   Antimicrobials:    Objective: Vitals:   04/11/23 2141 04/12/23 0500 04/12/23 0802 04/12/23 0834  BP: 114/82  104/72   Pulse: (!) 102  95   Resp: 18  17   Temp: 98.6 F (37 C)  98.2 F (36.8 C)   TempSrc: Oral  Oral   SpO2: 99%  99% 98%  Weight:  100 kg    Height:        Intake/Output Summary (Last 24 hours) at 04/12/2023 1155 Last data filed at 04/11/2023 1200 Gross per 24 hour  Intake 240 ml  Output 200 ml  Net 40 ml   Filed Weights   04/09/23 0500 04/10/23 0500 04/12/23 0500  Weight: 98.1 kg 99 kg 100 kg    Examination:  General exam: Appears calm and comfortable  Respiratory system: Clear to auscultation Cardiovascular system: S1 & S2 heard, RRR.  Abd: nondistended, soft and nontender.Normal bowel sounds heard. Central nervous system: Alert and oriented. No focal neurological deficits. Extremities: no edema Skin: No rashes Psychiatry:  Mood & affect appropriate.  Data Reviewed:   CBC: Recent Labs  Lab 04/06/23 1136 04/06/23 1151 04/08/23 0243 04/09/23 0248 04/10/23 0238 04/11/23 0314 04/12/23 0238  WBC 15.2*   < > 12.3* 10.6* 9.6 8.9 9.7  NEUTROABS 13.8*  --   --   --   --    --   --   HGB 11.2*   < > 9.4* 7.8* 7.6* 7.5* 8.3*  HCT 34.3*   < > 28.6* 23.9* 23.3* 22.4* 25.4*  MCV 91.5   < > 94.1 94.8 94.3 92.9 91.0  PLT 375   < > 266 226 258 278 385   < > = values in this interval not displayed.   Basic Metabolic Panel: Recent Labs  Lab 04/07/23 1107 04/08/23 0243 04/09/23 0248 04/10/23 0238 04/11/23 0314 04/12/23 0238  NA 133* 133* 130* 128* 129* 129*  K 3.8 4.8 3.8 3.8 3.9 4.1  CL 107 104 103 101 100 98  CO2 19* 21* 19* 20* 22 23  GLUCOSE 86 92 66* 115* 100* 89  BUN 25* 18 11 11 8 8   CREATININE 1.16 1.07 0.85 0.95 0.85 0.84  CALCIUM 6.9* 7.7* 6.6* 7.2* 7.7* 8.0*  MG 1.5* 2.0 1.7  --   --   --    GFR: Estimated Creatinine Clearance: 110.5 mL/min (by C-G formula based on SCr of 0.84 mg/dL). Liver Function Tests: Recent Labs  Lab 04/06/23 1136 04/07/23 1107 04/08/23 0243 04/09/23 0248 04/11/23 0314  AST 34 28 32 21 29  ALT 21 18 18 14 20   ALKPHOS 129* 124 115 108 126  BILITOT 1.0 0.9 1.1 0.7 0.9  PROT 6.4* 5.6* 5.4* 4.6* 4.7*  ALBUMIN 2.5* 2.1* 2.1* 1.6* 1.7*   Recent Labs  Lab 04/06/23 1136 04/07/23 1107  LIPASE 1,685* 301*   No results for input(s): "AMMONIA" in the last 168 hours. Coagulation Profile: No results for input(s): "INR", "PROTIME" in the last 168 hours. Cardiac Enzymes: No results for input(s): "CKTOTAL", "CKMB", "CKMBINDEX", "TROPONINI" in the last 168 hours. BNP (last 3 results) No results for input(s): "PROBNP" in the last 8760 hours. HbA1C: No results for input(s): "HGBA1C" in the last 72 hours. CBG: Recent Labs  Lab 04/10/23 2111  GLUCAP 129*   Lipid Profile: No results for input(s): "CHOL", "HDL", "LDLCALC", "TRIG", "CHOLHDL", "LDLDIRECT" in the last 72 hours. Thyroid Function Tests: No results for input(s): "TSH", "T4TOTAL", "FREET4", "T3FREE", "THYROIDAB" in the last 72 hours. Anemia Panel: No results for input(s): "VITAMINB12", "FOLATE", "FERRITIN", "TIBC", "IRON", "RETICCTPCT" in the last 72  hours. Urine analysis:    Component Value Date/Time   COLORURINE YELLOW 04/07/2023 0512   APPEARANCEUR CLEAR 04/07/2023 0512   LABSPEC 1.034 (H) 04/07/2023 0512   PHURINE 5.0 04/07/2023 0512   GLUCOSEU NEGATIVE 04/07/2023 0512   HGBUR SMALL (A) 04/07/2023 0512   BILIRUBINUR NEGATIVE 04/07/2023 0512   KETONESUR NEGATIVE 04/07/2023 0512   PROTEINUR NEGATIVE 04/07/2023 0512   NITRITE NEGATIVE 04/07/2023 0512   LEUKOCYTESUR NEGATIVE 04/07/2023 0512   Sepsis Labs: @LABRCNTIP (procalcitonin:4,lacticidven:4)  )No results found for this or any previous visit (from the past 240 hour(s)).   Radiology Studies: No results found.   Scheduled Meds:  apixaban  5 mg Oral BID   mometasone-formoterol  2 puff Inhalation BID   multivitamin with minerals   Oral Daily   pantoprazole (PROTONIX) IV  40 mg Intravenous Q12H   polyethylene glycol  17 g Oral BID   simethicone  40 mg Oral QID   Continuous Infusions:   LOS: 6  days    Time spent:    Zannie Cove, MD [facility]   04/12/2023, 11:55 AM

## 2023-04-12 NOTE — Plan of Care (Signed)
?  Problem: Elimination: ?Goal: Will not experience complications related to bowel motility ?Outcome: Progressing ?  ?Problem: Pain Managment: ?Goal: General experience of comfort will improve ?Outcome: Progressing ?  ?Problem: Safety: ?Goal: Ability to remain free from injury will improve ?Outcome: Progressing ?  ?

## 2023-04-13 ENCOUNTER — Inpatient Hospital Stay (HOSPITAL_COMMUNITY): Payer: 59

## 2023-04-13 DIAGNOSIS — K859 Acute pancreatitis without necrosis or infection, unspecified: Secondary | ICD-10-CM | POA: Diagnosis not present

## 2023-04-13 DIAGNOSIS — I5021 Acute systolic (congestive) heart failure: Secondary | ICD-10-CM | POA: Diagnosis not present

## 2023-04-13 LAB — CBC
HCT: 23.5 % — ABNORMAL LOW (ref 39.0–52.0)
Hemoglobin: 7.7 g/dL — ABNORMAL LOW (ref 13.0–17.0)
MCH: 29.6 pg (ref 26.0–34.0)
MCHC: 32.8 g/dL (ref 30.0–36.0)
MCV: 90.4 fL (ref 80.0–100.0)
Platelets: 346 10*3/uL (ref 150–400)
RBC: 2.6 MIL/uL — ABNORMAL LOW (ref 4.22–5.81)
RDW: 13.5 % (ref 11.5–15.5)
WBC: 8.3 10*3/uL (ref 4.0–10.5)
nRBC: 0 % (ref 0.0–0.2)

## 2023-04-13 LAB — IRON AND TIBC
Iron: 12 ug/dL — ABNORMAL LOW (ref 45–182)
Saturation Ratios: 12 % — ABNORMAL LOW (ref 17.9–39.5)
TIBC: 102 ug/dL — ABNORMAL LOW (ref 250–450)
UIBC: 90 ug/dL

## 2023-04-13 LAB — BASIC METABOLIC PANEL
Anion gap: 9 (ref 5–15)
BUN: 9 mg/dL (ref 8–23)
CO2: 25 mmol/L (ref 22–32)
Calcium: 8 mg/dL — ABNORMAL LOW (ref 8.9–10.3)
Chloride: 98 mmol/L (ref 98–111)
Creatinine, Ser: 0.96 mg/dL (ref 0.61–1.24)
GFR, Estimated: >60 mL/min (ref >60)
Glucose, Bld: 110 mg/dL — ABNORMAL HIGH (ref 70–99)
Potassium: 4.2 mmol/L (ref 3.5–5.1)
Sodium: 132 mmol/L — ABNORMAL LOW (ref 135–145)

## 2023-04-13 LAB — FERRITIN: Ferritin: 433 ng/mL — ABNORMAL HIGH (ref 24–336)

## 2023-04-13 LAB — ECHOCARDIOGRAM COMPLETE
Area-P 1/2: 3.83 cm2
Height: 73 in
S' Lateral: 3.2 cm
Weight: 3506.2 oz

## 2023-04-13 LAB — FOLATE: Folate: 20.7 ng/mL (ref >5.9)

## 2023-04-13 LAB — RETICULOCYTES
Immature Retic Fract: 30.5 % — ABNORMAL HIGH (ref 2.3–15.9)
RBC.: 2.27 MIL/uL — ABNORMAL LOW (ref 4.22–5.81)
Retic Count, Absolute: 84.2 10*3/uL (ref 19.0–186.0)
Retic Ct Pct: 3.7 % — ABNORMAL HIGH (ref 0.4–3.1)

## 2023-04-13 LAB — VITAMIN B12: Vitamin B-12: 211 pg/mL (ref 180–914)

## 2023-04-13 MED ORDER — SODIUM CHLORIDE 0.9 % IV SOLN
100.0000 mg | Freq: Once | INTRAVENOUS | Status: AC
  Administered 2023-04-13: 100 mg via INTRAVENOUS
  Filled 2023-04-13: qty 5

## 2023-04-13 NOTE — Progress Notes (Signed)
PROGRESS NOTE    Luis Marquez  WNU:[id number] DOB: 10/16/1960 DOA: 04/06/2023 PCP: Claiborne Rigg, NP  Luis Marquez is a 62 y.o. M with HFrEF EF 20-25%, pAF on Eliquis, COPD, alcohol use and hx DTs, and CAD who presented with epigastric pain. History of intermittent hematochezia for months, had a colonoscopy 5/24, multiple polyps removed In the ER, CT showed pancreatitis, possible pseudoaneurysm,  abdominal aortic aneurysm now measuring up to 3.7 cm. There is also increasing changes of a penetrating enhancing ulcer along the mural plaque and thrombus along the lower abdominal Aorta.  Lipase >1000.   Subjective: -Patient seen and examined, Dr. Sheryn Bison notes from last week reviewed -Noted some abdominal distention and discomfort last night, had an episode of vomiting  Assessment and Plan:   Acute pancreatitis ?  Developing pseudoaneurysm -Prior heavy alcoholism, quit few weeks ago, ultrasound negative for cholelithiasis -CT 9/19  noted focal 8 mm area of hyperenhancement posterior to the pancreatic head and uncinate worrisome for developing pseudoaneurysm. -Vascular recommended IR eval, seen by interventional radiology, felt to be hemodynamically stable initially, recommended CTA if he shows signs of bleeding or becomes hemodynamically unstable -Slow clinical improvement, tolerating soft diet, continue laxatives today  Anemia Hematochezia Baseline normal 3 months ago ~13 g/dL.   -Hemoglobin has trended down in the setting of likely hemodilution, history of hematochezia 10 days prior to admission, recent colonoscopy 6/24 noted multiple polyps which were removed -Eliquis resumed by my partner 3 days ago, no active bleeding, hemoglobin with mild downtrend, anemia panel with severe iron deficiency, add IV iron  AAA with ulcer and mural plaque, thrombus - Discussed with vascular surgery Dr. Myra Gianotti, recommended blood pressure control, outpatient follow-up in few months -Control BP,  fortunately has been in 100-110 range -Continue apixaban, add statin  Hyponatremia Mild, asymptomatic - Stop fluids  Hypomagnesemia Supplemented  Paroxysmal atrial fibrillation (HCC) HR controlled - Continue Eliquis - Hold metoprolol given soft BP  Chronic systolic CHF (congestive heart failure) (HCC) -Will repeat echo, IV Lasix X1  AKI (acute kidney injury) (HCC) Had AKI with Cr up to 3 mg/dL 2 weeks PTA in the setting of ARB use and diarrhea and hypotension.  Essential hypertension BP normal off meds - Hold losartan  Emphysema lung (HCC) - Continue ICS/LABA  Hypocalcemia Supplemented, now corrects with albumin  Severe hypoalbuminemia -Suspect chronic liver disease from EtOH abuse  DVT prophylaxis: Eliquis Code Status: Full code Family Communication: None present Disposition Plan: Home likely 1 to 2 days  Consultants:    Procedures:   Antimicrobials:    Objective: Vitals:   04/13/23 0543 04/13/23 0848 04/13/23 0947 04/13/23 1240  BP: 122/88  114/78 109/84  Pulse: (!) 117 98 98   Resp: 20 20 18 18   Temp: 98.2 F (36.8 C)  98.9 F (37.2 C) 98.5 F (36.9 C)  TempSrc: Oral  Oral Oral  SpO2: 100% 99% 97% 98%  Weight: 99.4 kg     Height:       No intake or output data in the 24 hours ending 04/13/23 1241  Filed Weights   04/10/23 0500 04/12/23 0500 04/13/23 0543  Weight: 99 kg 100 kg 99.4 kg    Examination:  General exam: Obese chronically ill male sitting up in bed, AAOx3 HEENT: No JVD CVS: S1-S2, regular rhythm Lungs: Clear bilaterally Abdomen: Firm, mildly distended, minimal tenderness, bowel sounds present Extremities: No edema  Skin: No rashes Psychiatry:  Mood & affect appropriate.     Data Reviewed:  CBC: Recent Labs  Lab 04/09/23 0248 04/10/23 0238 04/11/23 0314 04/12/23 0238 04/13/23 0227  WBC 10.6* 9.6 8.9 9.7 8.3  HGB 7.8* 7.6* 7.5* 8.3* 7.7*  HCT 23.9* 23.3* 22.4* 25.4* 23.5*  MCV 94.8 94.3 92.9 91.0 90.4  PLT 226  258 278 385 346   Basic Metabolic Panel: Recent Labs  Lab 04/07/23 1107 04/08/23 0243 04/09/23 0248 04/10/23 0238 04/11/23 0314 04/12/23 0238 04/13/23 0227  NA 133* 133* 130* 128* 129* 129* 132*  K 3.8 4.8 3.8 3.8 3.9 4.1 4.2  CL 107 104 103 101 100 98 98  CO2 19* 21* 19* 20* 22 23 25   GLUCOSE 86 92 66* 115* 100* 89 110*  BUN 25* 18 11 11 8 8 9   CREATININE 1.16 1.07 0.85 0.95 0.85 0.84 0.96  CALCIUM 6.9* 7.7* 6.6* 7.2* 7.7* 8.0* 8.0*  MG 1.5* 2.0 1.7  --   --   --   --    GFR: Estimated Creatinine Clearance: 96.4 mL/min (by C-G formula based on SCr of 0.96 mg/dL). Liver Function Tests: Recent Labs  Lab 04/07/23 1107 04/08/23 0243 04/09/23 0248 04/11/23 0314  AST 28 32 21 29  ALT 18 18 14 20   ALKPHOS 124 115 108 126  BILITOT 0.9 1.1 0.7 0.9  PROT 5.6* 5.4* 4.6* 4.7*  ALBUMIN 2.1* 2.1* 1.6* 1.7*   Recent Labs  Lab 04/07/23 1107  LIPASE 301*   No results for input(s): "AMMONIA" in the last 168 hours. Coagulation Profile: No results for input(s): "INR", "PROTIME" in the last 168 hours. Cardiac Enzymes: No results for input(s): "CKTOTAL", "CKMB", "CKMBINDEX", "TROPONINI" in the last 168 hours. BNP (last 3 results) No results for input(s): "PROBNP" in the last 8760 hours. HbA1C: No results for input(s): "HGBA1C" in the last 72 hours. CBG: Recent Labs  Lab 04/10/23 2111  GLUCAP 129*   Lipid Profile: No results for input(s): "CHOL", "HDL", "LDLCALC", "TRIG", "CHOLHDL", "LDLDIRECT" in the last 72 hours. Thyroid Function Tests: No results for input(s): "TSH", "T4TOTAL", "FREET4", "T3FREE", "THYROIDAB" in the last 72 hours. Anemia Panel: Recent Labs    04/13/23 0841  VITAMINB12 211  FOLATE 20.7  FERRITIN 433*  TIBC 102*  IRON 12*  RETICCTPCT 3.7*   Urine analysis:    Component Value Date/Time   COLORURINE YELLOW 04/07/2023 0512   APPEARANCEUR CLEAR 04/07/2023 0512   LABSPEC 1.034 (H) 04/07/2023 0512   PHURINE 5.0 04/07/2023 0512   GLUCOSEU NEGATIVE  04/07/2023 0512   HGBUR SMALL (A) 04/07/2023 0512   BILIRUBINUR NEGATIVE 04/07/2023 0512   KETONESUR NEGATIVE 04/07/2023 0512   PROTEINUR NEGATIVE 04/07/2023 0512   NITRITE NEGATIVE 04/07/2023 0512   LEUKOCYTESUR NEGATIVE 04/07/2023 0512   Sepsis Labs: @LABRCNTIP (procalcitonin:4,lacticidven:4)  )No results found for this or any previous visit (from the past 240 hour(s)).   Radiology Studies: No results found.   Scheduled Meds:  apixaban  5 mg Oral BID   lactulose  20 g Oral BID   mometasone-formoterol  2 puff Inhalation BID   multivitamin with minerals   Oral Daily   pantoprazole (PROTONIX) IV  40 mg Intravenous Q12H   polyethylene glycol  17 g Oral BID   Continuous Infusions:   LOS: 7 days    Time spent:    Zannie Cove, MD [facility]   04/13/2023, 12:41 PM

## 2023-04-13 NOTE — Progress Notes (Signed)
   04/13/23 0000  Assess: MEWS Score  Temp 98.1 F (36.7 C)  BP 115/78  MAP (mmHg) 89  Pulse Rate (!) 111  Resp 18  Level of Consciousness Alert  SpO2 99 %  O2 Device Room Air  Assess: MEWS Score  MEWS Temp 0  MEWS Systolic 0  MEWS Pulse 2  MEWS RR 0  MEWS LOC 0  MEWS Score 2  MEWS Score Color Yellow  Assess: if the MEWS score is Yellow or Red  Were vital signs accurate and taken at a resting state? Yes  Does the patient meet 2 or more of the SIRS criteria? Yes  Does the patient have a confirmed or suspected source of infection? Yes  MEWS guidelines implemented  Yes, yellow  Treat  MEWS Interventions Considered administering scheduled or prn medications/treatments as ordered  Take Vital Signs  Increase Vital Sign Frequency  Yellow: Q2hr x1, continue Q4hrs until patient remains green for 12hrs  Escalate  MEWS: Escalate Yellow: Discuss with charge nurse and consider notifying provider and/or RRT  Notify: Charge Nurse/RN  Name of Charge Nurse/RN Notified Ruth,RN  Provider Notification  Provider Name/Title Virgel Manifold, NP  Date Provider Notified 04/13/23  Time Provider Notified 0031  Method of Notification Page  Notification Reason Change in status  Provider response See new orders  Date of Provider Response 04/13/23  Time of Provider Response 0033  Assess: SIRS CRITERIA  SIRS Temperature  0  SIRS Pulse 1  SIRS Respirations  0  SIRS WBC 0  SIRS Score Sum  1

## 2023-04-13 NOTE — Progress Notes (Signed)
   04/13/23 0200  Assess: MEWS Score  Temp 98.1 F (36.7 C)  BP 107/82  MAP (mmHg) 91  Pulse Rate (!) 105  Resp 18  Level of Consciousness Alert  SpO2 95 %  O2 Device Room Air  Assess: MEWS Score  MEWS Temp 0  MEWS Systolic 0  MEWS Pulse 1  MEWS RR 0  MEWS LOC 0  MEWS Score 1  MEWS Score Color Green  Assess: if the MEWS score is Yellow or Red  MEWS guidelines implemented  No, previously yellow, continue vital signs every 4 hours  Assess: SIRS CRITERIA  SIRS Temperature  0  SIRS Pulse 1  SIRS Respirations  0  SIRS WBC 0  SIRS Score Sum  1

## 2023-04-13 NOTE — Plan of Care (Signed)

## 2023-04-13 NOTE — Progress Notes (Signed)
  Echocardiogram 2D Echocardiogram has been performed.  Leda Roys RDCS 04/13/2023, 2:24 PM

## 2023-04-13 NOTE — Plan of Care (Signed)
  Problem: Pain Managment: Goal: General experience of comfort will improve Outcome: Progressing   Problem: Safety: Goal: Ability to remain free from injury will improve Outcome: Progressing   

## 2023-04-14 DIAGNOSIS — K859 Acute pancreatitis without necrosis or infection, unspecified: Secondary | ICD-10-CM | POA: Diagnosis not present

## 2023-04-14 LAB — COMPREHENSIVE METABOLIC PANEL
ALT: 19 U/L (ref 0–44)
AST: 26 U/L (ref 15–41)
Albumin: 2 g/dL — ABNORMAL LOW (ref 3.5–5.0)
Alkaline Phosphatase: 117 U/L (ref 38–126)
Anion gap: 7 (ref 5–15)
BUN: 9 mg/dL (ref 8–23)
CO2: 26 mmol/L (ref 22–32)
Calcium: 8.1 mg/dL — ABNORMAL LOW (ref 8.9–10.3)
Chloride: 102 mmol/L (ref 98–111)
Creatinine, Ser: 0.92 mg/dL (ref 0.61–1.24)
GFR, Estimated: >60 mL/min (ref >60)
Glucose, Bld: 106 mg/dL — ABNORMAL HIGH (ref 70–99)
Potassium: 3.9 mmol/L (ref 3.5–5.1)
Sodium: 135 mmol/L (ref 135–145)
Total Bilirubin: 0.7 mg/dL (ref 0.3–1.2)
Total Protein: 5.4 g/dL — ABNORMAL LOW (ref 6.5–8.1)

## 2023-04-14 LAB — CBC
HCT: 23.6 % — ABNORMAL LOW (ref 39.0–52.0)
HCT: 23.6 % — ABNORMAL LOW (ref 39.0–52.0)
Hemoglobin: 7.6 g/dL — ABNORMAL LOW (ref 13.0–17.0)
Hemoglobin: 7.8 g/dL — ABNORMAL LOW (ref 13.0–17.0)
MCH: 29.5 pg (ref 26.0–34.0)
MCH: 31.1 pg (ref 26.0–34.0)
MCHC: 32.2 g/dL (ref 30.0–36.0)
MCHC: 33.1 g/dL (ref 30.0–36.0)
MCV: 91.5 fL (ref 80.0–100.0)
MCV: 94 fL (ref 80.0–100.0)
Platelets: 400 10*3/uL (ref 150–400)
Platelets: 368 10*3/uL (ref 150–400)
RBC: 2.58 MIL/uL — ABNORMAL LOW (ref 4.22–5.81)
RBC: 2.51 MIL/uL — ABNORMAL LOW (ref 4.22–5.81)
RDW: 13.7 % (ref 11.5–15.5)
RDW: 13.9 % (ref 11.5–15.5)
WBC: 7.3 10*3/uL (ref 4.0–10.5)
WBC: 7.8 10*3/uL (ref 4.0–10.5)
nRBC: 0 % (ref 0.0–0.2)
nRBC: 0 % (ref 0.0–0.2)

## 2023-04-14 LAB — LIPASE, BLOOD: Lipase: 118 U/L — ABNORMAL HIGH (ref 11–51)

## 2023-04-14 MED ORDER — SODIUM CHLORIDE 0.9 % IV SOLN
200.0000 mg | Freq: Once | INTRAVENOUS | Status: AC
  Administered 2023-04-14: 200 mg via INTRAVENOUS
  Filled 2023-04-14: qty 10

## 2023-04-14 MED ORDER — LACTULOSE 10 GM/15ML PO SOLN: 20.0000 g | Freq: Two times a day (BID) | ORAL | Status: DC | PRN

## 2023-04-14 NOTE — Progress Notes (Signed)
PROGRESS NOTE    Luis Marquez  AVW:[id number] DOB: 03/23/1961 DOA: 04/06/2023 PCP: Claiborne Rigg, NP  Luis Marquez is a 62 y.o. M with HFrEF EF 20-25%, pAF on Eliquis, COPD, alcohol use and hx DTs, and CAD who presented with epigastric pain. History of intermittent hematochezia for months, had a colonoscopy 5/24, multiple polyps removed In the ER, CT showed pancreatitis, possible pseudoaneurysm,  abdominal aortic aneurysm now measuring up to 3.7 cm. There is also increasing changes of a penetrating enhancing ulcer along the mural plaque and thrombus along the lower abdominal Aorta.  Lipase >1000.   Subjective: -Feels better today, no abdominal discomfort, had a BM with scant blood  Assessment and Plan:   Acute pancreatitis ?  Developing pseudoaneurysm -Prior heavy alcoholism, quit few weeks ago, ultrasound negative for cholelithiasis -CT 9/19  noted focal 8 mm area of hyperenhancement posterior to the pancreatic head and uncinate worrisome for developing pseudoaneurysm. -Vascular recommended IR eval, seen by interventional radiology, felt to be hemodynamically stable initially, recommended CTA if he shows signs of bleeding or becomes hemodynamically unstable -Continued improvement, tolerating diet, continue laxatives today  Anemia Hematochezia Baseline normal 3 months ago ~13 g/dL.   -Hemoglobin has trended down in the setting of likely hemodilution, history of hematochezia 10 days prior to admission, recent colonoscopy 6/24 noted multiple polyps which were removed -Eliquis resumed by my partner 4 days ago, 1 episode of scant bleeding earlier this morning a.m. labs are still pending at 12 PM will hold Eliquis today, given IV iron, monitor hemoglobin  AAA with ulcer and mural plaque, thrombus - Discussed with vascular surgery Dr. Myra Gianotti, recommended blood pressure control, outpatient follow-up in few months -Control BP, fortunately has been in 100-110 range -Continue apixaban,  add statin  Hyponatremia Mild, asymptomatic - Stop fluids  Hypomagnesemia Supplemented  Paroxysmal atrial fibrillation (HCC) HR controlled - Continue Eliquis - Hold metoprolol given soft BP  Chronic systolic CHF (congestive heart failure) (HCC) -Will repeat echo, IV Lasix X1  AKI (acute kidney injury) (HCC) Had AKI with Cr up to 3 mg/dL 2 weeks PTA in the setting of ARB use and diarrhea and hypotension.  Essential hypertension BP normal off meds - Hold losartan  Emphysema lung (HCC) - Continue ICS/LABA  Hypocalcemia Supplemented, now corrects with albumin  Severe hypoalbuminemia -Suspect chronic liver disease from EtOH abuse  DVT prophylaxis: Eliquis Code Status: Full code Family Communication: None present Disposition Plan: Home likely 1 to 2 days  Consultants:    Procedures:   Antimicrobials:    Objective: Vitals:   04/14/23 0412 04/14/23 0412 04/14/23 0809 04/14/23 1027  BP:  92/65  110/85  Pulse:  93 (!) 106   Resp:  18 18 18   Temp:  98.3 F (36.8 C)  98.8 F (37.1 C)  TempSrc:  Oral  Oral  SpO2:  94% 96% 98%  Weight: 99.2 kg     Height:       No intake or output data in the 24 hours ending 04/14/23 1246  Filed Weights   04/12/23 0500 04/13/23 0543 04/14/23 0412  Weight: 100 kg 99.4 kg 99.2 kg    Examination:  General exam: Obese chronically ill male sitting up in bed, AAOx3 HEENT: No JVD CVS: S1-S2, regular rhythm Lungs: Clear bilaterally Abdomen: Firm, mildly distended, minimal tenderness, bowel sounds present Extremities: No edema  Skin: No rashes Psychiatry:  Mood & affect appropriate.     Data Reviewed:   CBC: Recent Labs  Lab 04/09/23 0248 04/10/23  1610 04/11/23 0314 04/12/23 0238 04/13/23 0227  WBC 10.6* 9.6 8.9 9.7 8.3  HGB 7.8* 7.6* 7.5* 8.3* 7.7*  HCT 23.9* 23.3* 22.4* 25.4* 23.5*  MCV 94.8 94.3 92.9 91.0 90.4  PLT 226 258 278 385 346   Basic Metabolic Panel: Recent Labs  Lab 04/08/23 0243 04/09/23 0248  04/10/23 0238 04/11/23 0314 04/12/23 0238 04/13/23 0227  NA 133* 130* 128* 129* 129* 132*  K 4.8 3.8 3.8 3.9 4.1 4.2  CL 104 103 101 100 98 98  CO2 21* 19* 20* 22 23 25   GLUCOSE 92 66* 115* 100* 89 110*  BUN 18 11 11 8 8 9   CREATININE 1.07 0.85 0.95 0.85 0.84 0.96  CALCIUM 7.7* 6.6* 7.2* 7.7* 8.0* 8.0*  MG 2.0 1.7  --   --   --   --    GFR: Estimated Creatinine Clearance: 96.3 mL/min (by C-G formula based on SCr of 0.96 mg/dL). Liver Function Tests: Recent Labs  Lab 04/08/23 0243 04/09/23 0248 04/11/23 0314  AST 32 21 29  ALT 18 14 20   ALKPHOS 115 108 126  BILITOT 1.1 0.7 0.9  PROT 5.4* 4.6* 4.7*  ALBUMIN 2.1* 1.6* 1.7*   No results for input(s): "LIPASE", "AMYLASE" in the last 168 hours.  No results for input(s): "AMMONIA" in the last 168 hours. Coagulation Profile: No results for input(s): "INR", "PROTIME" in the last 168 hours. Cardiac Enzymes: No results for input(s): "CKTOTAL", "CKMB", "CKMBINDEX", "TROPONINI" in the last 168 hours. BNP (last 3 results) No results for input(s): "PROBNP" in the last 8760 hours. HbA1C: No results for input(s): "HGBA1C" in the last 72 hours. CBG: Recent Labs  Lab 04/10/23 2111  GLUCAP 129*   Lipid Profile: No results for input(s): "CHOL", "HDL", "LDLCALC", "TRIG", "CHOLHDL", "LDLDIRECT" in the last 72 hours. Thyroid Function Tests: No results for input(s): "TSH", "T4TOTAL", "FREET4", "T3FREE", "THYROIDAB" in the last 72 hours. Anemia Panel: Recent Labs    04/13/23 0841  VITAMINB12 211  FOLATE 20.7  FERRITIN 433*  TIBC 102*  IRON 12*  RETICCTPCT 3.7*   Urine analysis:    Component Value Date/Time   COLORURINE YELLOW 04/07/2023 0512   APPEARANCEUR CLEAR 04/07/2023 0512   LABSPEC 1.034 (H) 04/07/2023 0512   PHURINE 5.0 04/07/2023 0512   GLUCOSEU NEGATIVE 04/07/2023 0512   HGBUR SMALL (A) 04/07/2023 0512   BILIRUBINUR NEGATIVE 04/07/2023 0512   KETONESUR NEGATIVE 04/07/2023 0512   PROTEINUR NEGATIVE 04/07/2023  0512   NITRITE NEGATIVE 04/07/2023 0512   LEUKOCYTESUR NEGATIVE 04/07/2023 0512   Sepsis Labs: @LABRCNTIP (procalcitonin:4,lacticidven:4)  )No results found for this or any previous visit (from the past 240 hour(s)).   Radiology Studies: ECHOCARDIOGRAM COMPLETE  Result Date: 04/13/2023    ECHOCARDIOGRAM REPORT   Patient Name:   Luis Marquez Date of Exam: 04/13/2023 Medical Rec #:  [id number]    Height:       73.0 in Accession #:    [id number]   Weight:       219.1 lb Date of Birth:  07-12-1961    BSA:          2.237 m Patient Age:    62 years     BP:           122/88 mmHg Patient Gender: M            HR:           97 bpm. Exam Location:  Inpatient Procedure: 2D Echo, Cardiac Doppler and Color Doppler Indications:  CHF-Acute Systolic I50.21  History:        Patient has prior history of Echocardiogram examinations, most                 recent 11/04/2021.  Sonographer:    Harriette Bouillon RDCS Referring Phys: [phone] Sharetha Newson IMPRESSIONS  1. Left ventricular ejection fraction, by estimation, is 60 to 65%. The left ventricle has normal function. The left ventricle has no regional wall motion abnormalities. Left ventricular diastolic parameters are consistent with Grade I diastolic dysfunction (impaired relaxation).  2. Right ventricular systolic function is normal. The right ventricular size is normal. Tricuspid regurgitation signal is inadequate for assessing PA pressure.  3. A small pericardial effusion is present. The pericardial effusion is circumferential.  4. The mitral valve is normal in structure. No evidence of mitral valve regurgitation. No evidence of mitral stenosis.  5. The aortic valve is normal in structure. Aortic valve regurgitation is not visualized. No aortic stenosis is present.  6. The inferior vena cava is normal in size with greater than 50% respiratory variability, suggesting right atrial pressure of 3 mmHg. FINDINGS  Left Ventricle: Left ventricular ejection fraction, by estimation, is  60 to 65%. The left ventricle has normal function. The left ventricle has no regional wall motion abnormalities. The left ventricular internal cavity size was normal in size. There is  no left ventricular hypertrophy. Left ventricular diastolic parameters are consistent with Grade I diastolic dysfunction (impaired relaxation). Right Ventricle: The right ventricular size is normal. No increase in right ventricular wall thickness. Right ventricular systolic function is normal. Tricuspid regurgitation signal is inadequate for assessing PA pressure. Left Atrium: Left atrial size was normal in size. Right Atrium: Right atrial size was normal in size. Pericardium: A small pericardial effusion is present. The pericardial effusion is circumferential. Presence of epicardial fat layer. Mitral Valve: The mitral valve is normal in structure. No evidence of mitral valve regurgitation. No evidence of mitral valve stenosis. Tricuspid Valve: The tricuspid valve is normal in structure. Tricuspid valve regurgitation is not demonstrated. No evidence of tricuspid stenosis. Aortic Valve: The aortic valve is normal in structure. Aortic valve regurgitation is not visualized. No aortic stenosis is present. Pulmonic Valve: The pulmonic valve was normal in structure. Pulmonic valve regurgitation is not visualized. No evidence of pulmonic stenosis. Aorta: The aortic root is normal in size and structure. Venous: The inferior vena cava is normal in size with greater than 50% respiratory variability, suggesting right atrial pressure of 3 mmHg. IAS/Shunts: No atrial level shunt detected by color flow Doppler.  LEFT VENTRICLE PLAX 2D LVIDd:         4.20 cm Diastology LVIDs:         3.20 cm LV e' medial:    8.38 cm/s LV PW:         0.90 cm LV E/e' medial:  8.7 LV IVS:        1.00 cm LV e' lateral:   7.40 cm/s                        LV E/e' lateral: 9.8  IVC IVC diam: 2.00 cm LEFT ATRIUM             Index LA diam:        3.00 cm 1.34 cm/m LA Vol  (A2C):   49.7 ml 22.22 ml/m LA Vol (A4C):   26.2 ml 11.71 ml/m LA Biplane Vol: 36.3 ml 16.23 ml/m  AORTIC VALVE  LVOT Vmax:   115.00 cm/s LVOT Vmean:  65.800 cm/s LVOT VTI:    0.181 m MITRAL VALVE MV Area (PHT): 3.83 cm    SHUNTS MV Decel Time: 198 msec    Systemic VTI: 0.18 m MV E velocity: 72.80 cm/s MV A velocity: 92.50 cm/s MV E/A ratio:  0.79 Kardie Tobb DO Electronically signed by Thomasene Ripple DO Signature Date/Time: 04/13/2023/4:19:49 PM    Final      Scheduled Meds:  apixaban  5 mg Oral BID   lactulose  20 g Oral BID   mometasone-formoterol  2 puff Inhalation BID   multivitamin with minerals   Oral Daily   pantoprazole (PROTONIX) IV  40 mg Intravenous Q12H   polyethylene glycol  17 g Oral BID   Continuous Infusions:   LOS: 8 days    Time spent:    Zannie Cove, MD [facility]   04/14/2023, 12:46 PM

## 2023-04-14 NOTE — Plan of Care (Signed)

## 2023-04-15 ENCOUNTER — Inpatient Hospital Stay (HOSPITAL_COMMUNITY): Payer: 59

## 2023-04-15 DIAGNOSIS — K859 Acute pancreatitis without necrosis or infection, unspecified: Secondary | ICD-10-CM | POA: Diagnosis not present

## 2023-04-15 LAB — CBC
HCT: 20 % — ABNORMAL LOW (ref 39.0–52.0)
HCT: 22.4 % — ABNORMAL LOW (ref 39.0–52.0)
Hemoglobin: 6.6 g/dL — CL (ref 13.0–17.0)
Hemoglobin: 7.4 g/dL — ABNORMAL LOW (ref 13.0–17.0)
MCH: 30.1 pg (ref 26.0–34.0)
MCH: 29.5 pg (ref 26.0–34.0)
MCHC: 33 g/dL (ref 30.0–36.0)
MCHC: 33 g/dL (ref 30.0–36.0)
MCV: 91.3 fL (ref 80.0–100.0)
MCV: 89.2 fL (ref 80.0–100.0)
Platelets: 337 10*3/uL (ref 150–400)
Platelets: 340 10*3/uL (ref 150–400)
RBC: 2.19 MIL/uL — ABNORMAL LOW (ref 4.22–5.81)
RBC: 2.51 MIL/uL — ABNORMAL LOW (ref 4.22–5.81)
RDW: 13.9 % (ref 11.5–15.5)
RDW: 14.1 % (ref 11.5–15.5)
WBC: 7.3 10*3/uL (ref 4.0–10.5)
WBC: 7.3 10*3/uL (ref 4.0–10.5)
nRBC: 0 % (ref 0.0–0.2)
nRBC: 0 % (ref 0.0–0.2)

## 2023-04-15 LAB — BASIC METABOLIC PANEL
Anion gap: 12 (ref 5–15)
BUN: 8 mg/dL (ref 8–23)
CO2: 23 mmol/L (ref 22–32)
Calcium: 7.6 mg/dL — ABNORMAL LOW (ref 8.9–10.3)
Chloride: 99 mmol/L (ref 98–111)
Creatinine, Ser: 0.86 mg/dL (ref 0.61–1.24)
GFR, Estimated: >60 mL/min (ref >60)
Glucose, Bld: 121 mg/dL — ABNORMAL HIGH (ref 70–99)
Potassium: 3.1 mmol/L — ABNORMAL LOW (ref 3.5–5.1)
Sodium: 134 mmol/L — ABNORMAL LOW (ref 135–145)

## 2023-04-15 LAB — PREPARE RBC (CROSSMATCH)

## 2023-04-15 MED ORDER — POTASSIUM CHLORIDE CRYS ER 20 MEQ PO TBCR
40.0000 meq | EXTENDED_RELEASE_TABLET | Freq: Once | ORAL | Status: AC
  Administered 2023-04-15: 40 meq via ORAL
  Filled 2023-04-15: qty 2

## 2023-04-15 MED ORDER — SODIUM CHLORIDE 0.9% IV SOLUTION: Freq: Once | INTRAVENOUS | Status: AC

## 2023-04-15 MED ORDER — IOHEXOL 350 MG/ML SOLN
100.0000 mL | Freq: Once | INTRAVENOUS | Status: AC | PRN
  Administered 2023-04-15: 100 mL via INTRAVENOUS

## 2023-04-15 NOTE — Consult Note (Signed)
Reason for Consult: Hematochezia and ? Pancreatic pseudoaneurysm Referring Physician: [facility]  Vikki Ports HPI: This is a 62 year old male with a PMH of ETOH pancreatitis, ETOH abuse, LA Grade D esophagitis (11/2021) HTN, CHF, and paroxysmal afib admitted for nausea, vomiting, and abdominal pain.  Work up at the time of admission on 04/06/2023 was positive for pancreatitis as his lipase was at 1500.  He was progressing until yesterday when he had two blood tinged bowel movements.  The patient reports having bloody bowel movements since June this year, but he had a colonoscopy with Dr. Rhea Belton for complaints of hematochezia.  Left sided diverticula were found as well as several small adenomas.  During this admission, after IV hydration, his HGB ranged from 7-9, but he had a drop in his HGB today down to 6.6 g/dL.  At the time of admission his CTA was positive for a possible pancreatic pseudoaneurysm.  IR was consulted at the time of admission as well as vascular surgery.  At that time it was not felt to be bleeding.  The recommendation was to repeat the CTA if bleeding recurred.  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   Emphysema, unspecified (HCC)    ETOH abuse    Fracture, intertrochanteric, right femur (HCC) 11/11/2014   Hypertension    Hypomagnesemia    Laceration of head 11/03/2021   Paroxysmal A-fib (HCC)    Syncope 12/07/2021   Tobacco abuse     Past Surgical History:  Procedure Laterality Date   BIOPSY  12/10/2021   Procedure: BIOPSY;  Surgeon: Napoleon Form, MD;  Location[facility] ENDOSCOPY;  Service: Gastroenterology;;   BIOPSY  11/24/2022   Procedure: BIOPSY;  Surgeon: Beverley Fiedler, MD;  Location: [facility] ENDOSCOPY;  Service: Gastroenterology;;   COLONOSCOPY WITH PROPOFOL N/A 11/24/2022   Procedure: COLONOSCOPY WITH PROPOFOL;  Surgeon: Beverley Fiedler, MD;  Location: [facility] ENDOSCOPY;  Service:  Gastroenterology;  Laterality: N/A;   ESOPHAGOGASTRODUODENOSCOPY (EGD) WITH PROPOFOL N/A 12/10/2021   Procedure: ESOPHAGOGASTRODUODENOSCOPY (EGD) WITH PROPOFOL;  Surgeon: Napoleon Form, MD;  Location: [facility] ENDOSCOPY;  Service: Gastroenterology;  Laterality: N/A;   ESOPHAGOGASTRODUODENOSCOPY (EGD) WITH PROPOFOL N/A 11/24/2022   Procedure: ESOPHAGOGASTRODUODENOSCOPY (EGD) WITH PROPOFOL;  Surgeon: Beverley Fiedler, MD;  Location: [facility] ENDOSCOPY;  Service: Gastroenterology;  Laterality: N/A;   HEMOSTASIS CLIP PLACEMENT  12/10/2021   Procedure: HEMOSTASIS CLIP PLACEMENT;  Surgeon: Napoleon Form, MD;  Location: [facility] ENDOSCOPY;  Service: Gastroenterology;;   HERNIA REPAIR     INTRAMEDULLARY (IM) NAIL INTERTROCHANTERIC Right 11/11/2014   Procedure: INTRAMEDULLARY (IM) NAIL INTERTROCHANTRIC;  Surgeon: Durene Romans, MD;  Location: [facility] ORS;  Service: Orthopedics;  Laterality: Right;   POLYPECTOMY  11/24/2022   Procedure: POLYPECTOMY;  Surgeon: Beverley Fiedler, MD;  Location: [facility] ENDOSCOPY;  Service: Gastroenterology;;   RIGHT/LEFT HEART CATH AND CORONARY ANGIOGRAPHY N/A 11/11/2021   Procedure: RIGHT/LEFT HEART CATH AND CORONARY ANGIOGRAPHY;  Surgeon: Dolores Patty, MD;  Location: [facility] INVASIVE CV LAB;  Service: Cardiovascular;  Laterality: N/A;   Rod right leg      Family History  Problem Relation Age of Onset   Diabetes Mother    Colon cancer Neg Hx    Stomach cancer Neg Hx    Esophageal cancer Neg Hx     Social History:  reports that he has been smoking cigarettes. He has a 12.5 pack-year smoking history. He has never been exposed to  tobacco smoke. He has never used smokeless tobacco. He reports current alcohol use. He reports that he does not currently use drugs.  Allergies: No Known Allergies  Medications: Scheduled:  mometasone-formoterol  2 puff Inhalation BID   multivitamin with minerals   Oral Daily   pantoprazole (PROTONIX) IV  40 mg Intravenous Q12H   Continuous:  Results for orders  placed or performed during the hospital encounter of 04/06/23 (from the past 24 hour(s))  CBC     Status: Abnormal   Collection Time: 04/14/23  8:55 PM  Result Value Ref Range   WBC 7.8 4.0 - 10.5 K/uL   RBC 2.51 (L) 4.22 - 5.81 MIL/uL   Hemoglobin 7.8 (L) 13.0 - 17.0 g/dL   HCT 95.2 (L) 84.1 - 32.4 %   MCV 94.0 80.0 - 100.0 fL   MCH 31.1 26.0 - 34.0 pg   MCHC 33.1 30.0 - 36.0 g/dL   RDW 40.1 02.7 - 25.3 %   Platelets 368 150 - 400 K/uL   nRBC 0.0 0.0 - 0.2 %  CBC     Status: Abnormal   Collection Time: 04/15/23  2:24 AM  Result Value Ref Range   WBC 7.3 4.0 - 10.5 K/uL   RBC 2.19 (L) 4.22 - 5.81 MIL/uL   Hemoglobin 6.6 (LL) 13.0 - 17.0 g/dL   HCT 66.4 (L) 40.3 - 47.4 %   MCV 91.3 80.0 - 100.0 fL   MCH 30.1 26.0 - 34.0 pg   MCHC 33.0 30.0 - 36.0 g/dL   RDW 25.9 56.3 - 87.5 %   Platelets 337 150 - 400 K/uL   nRBC 0.0 0.0 - 0.2 %  Basic metabolic panel     Status: Abnormal   Collection Time: 04/15/23  2:24 AM  Result Value Ref Range   Sodium 134 (L) 135 - 145 mmol/L   Potassium 3.1 (L) 3.5 - 5.1 mmol/L   Chloride 99 98 - 111 mmol/L   CO2 23 22 - 32 mmol/L   Glucose, Bld 121 (H) 70 - 99 mg/dL   BUN 8 8 - 23 mg/dL   Creatinine, Ser 6.43 0.61 - 1.24 mg/dL   Calcium 7.6 (L) 8.9 - 10.3 mg/dL   GFR, Estimated >32 >95 mL/min   Anion gap 12 5 - 15  Type and screen [facility]     Status: None (Preliminary result)   Collection Time: 04/15/23  4:54 AM  Result Value Ref Range   ABO/RH(D) O POS    Antibody Screen NEG    Sample Expiration 04/18/2023,2359    Unit Number [id number]    Blood Component Type RBC LR PHER2    Unit division 00    Status of Unit ISSUED    Transfusion Status OK TO TRANSFUSE    Crossmatch Result      Compatible Performed at [facility], [address], [place], [place] [address]   Prepare RBC (crossmatch)     Status: None   Collection Time: 04/15/23  4:54 AM  Result Value Ref Range   Order Confirmation      ORDER PROCESSED  BY BLOOD BANK Performed at [facility], [address] [place], [place] [address]   CBC     Status: Abnormal   Collection Time: 04/15/23 11:51 AM  Result Value Ref Range   WBC 7.3 4.0 - 10.5 K/uL   RBC 2.51 (L) 4.22 - 5.81 MIL/uL   Hemoglobin 7.4 (L) 13.0 - 17.0 g/dL  HCT 22.4 (L) 39.0 - 52.0 %   MCV 89.2 80.0 - 100.0 fL   MCH 29.5 26.0 - 34.0 pg   MCHC 33.0 30.0 - 36.0 g/dL   RDW 09.8 11.9 - 14.7 %   Platelets 340 150 - 400 K/uL   nRBC 0.0 0.0 - 0.2 %     No results found.  ROS:  As stated above in the HPI otherwise negative.  Blood pressure 111/78, pulse 76, temperature 98.7 F (37.1 C), temperature source Oral, resp. rate 17, height 6\' 1"  (1.854 m), weight 97.8 kg, SpO2 93%.    PE: Gen: NAD, Alert and Oriented HEENT:  [place]/AT, EOMI Neck: Supple, no LAD Lungs: CTA Bilaterally CV: RRR without M/G/R ABD: Soft, NTND, +BS Ext: No C/C/E  Assessment/Plan: 1) ? Pancreatic pseudoaneurysm. 2) Hematochezia. 3) Anemia. 4) ETOH pancreatitis. 5) ETOH abuse. 6) Abdominal aortic aneurysm.   The patient is stable.  He had images of the bloody bowel movements, but it was not as severe as initially thought.  The greatest concern is bleeding from the pseudoaneurysm.  The volume of blood currently does not appear concerning, but it is best to pursue a CTA.  This was ordered earlier in the AM and it is still pending.  If the CTA is negative, his bleeding may be from a diverticular source or hemorrhoidal source.  This was exacerbated by the recent use of anticoagulation.  Plan: 1) Await CTA. 2) Follow HGB and transfuse as necessary. Gadge Hermiz D 04/15/2023, 6:15 PM

## 2023-04-15 NOTE — Progress Notes (Signed)
PROGRESS NOTE    [name]  UEA:[id number] DOB: [birth year] DOA: 04/06/2023 PCP: Claiborne Rigg, NP  Luis Marquez is a 62 y.o. M with HFrEF EF 20-25%, pAF on Eliquis, COPD, alcohol use and hx DTs, and CAD who presented with epigastric pain. History of intermittent hematochezia for months, had a colonoscopy 5/24, multiple polyps removed In the ER, CT showed pancreatitis, possible pseudoaneurysm,  abdominal aortic aneurysm now measuring up to 3.7 cm. There is also increasing changes of a penetrating enhancing ulcer along the mural plaque and thrombus along the lower abdominal Aorta.  Lipase >1000.  -Seen by IR, recommended supportive care on admission -Pancreatitis slowly improving, diet advanced -9/27 with multiple episodes of hematochezia -9/28, hemoglobin down to 6.6, transfusing, obtain CTA, GI input requested  Subjective: -Has some abdominal discomfort, at least 3 episodes of hematochezia yesterday, no nausea or vomiting  Assessment and Plan:   Acute pancreatitis ?  pseudoaneurysm -Prior heavy alcoholism, quit few weeks ago, ultrasound negative for cholelithiasis -CT 9/19  noted focal 8 mm area of hyperenhancement posterior to the pancreatic head and uncinate worrisome for developing pseudoaneurysm. -Vascular recommended IR eval on admission, seen by IR felt to be hemodynamically stable without active bleeding initially -Treated with supportive care, was improving, tolerating soft diet, now with hematochezia, see below -Follow-up CTA  Anemia Hematochezia Baseline normal 3 months ago ~13 g/dL.   history of ongoing hematochezia intermittently for months, last colonoscopy 6/24 multiple polyps were removed, diverticulosis -Eliquis resumed by my partner 4 days ago, held yesterday with recurrent hematochezia -3-4 episodes yesterday afternoon and evening, hemoglobin has dropped drifted down to 6.6 today, transfusing PRBC, will obtain CTA abdomen pelvis, change diet to clears, GI  consulted  AAA with ulcer and mural plaque, thrombus - Discussed with vascular surgery Dr. Myra Gianotti, recommended blood pressure control, outpatient follow-up in few months -BP stable  Hyponatremia Mild, asymptomatic  Hypomagnesemia Supplemented  Paroxysmal atrial fibrillation (HCC) HR controlled - Continue Eliquis - Metoprolol on hold  Chronic systolic CHF (congestive heart failure) (HCC) -Repeat echo with normalization of EF, remains euvolemic  AKI (acute kidney injury) (HCC) Had AKI with Cr up to 3 mg/dL 2 weeks PTA in the setting of ARB use and diarrhea and hypotension.  Essential hypertension BP normal off meds - Hold losartan  Emphysema lung (HCC) - Continue ICS/LABA  Hypocalcemia Supplemented, now corrects with albumin  Severe hypoalbuminemia -Suspect chronic liver disease from EtOH abuse  DVT prophylaxis: Eliquis Code Status: Full code Family Communication: None present Disposition Plan: To be determined  Consultants:    Procedures:   Antimicrobials:    Objective: Vitals:   04/15/23 0637 04/15/23 0744 04/15/23 0835 04/15/23 0858  BP: 113/77 (!) 104/57  121/68  Pulse: 86 87  79  Resp: 18 17    Temp: 98.4 F (36.9 C) 98.5 F (36.9 C)  98.8 F (37.1 C)  TempSrc: Oral Oral  Oral  SpO2: 99% 98% 98% 98%  Weight:      Height:        Intake/Output Summary (Last 24 hours) at 04/15/2023 1004 Last data filed at 04/15/2023 0858 Gross per 24 hour  Intake 552 ml  Output --  Net 552 ml    Filed Weights   04/13/23 0543 04/14/23 0412 04/15/23 0413  Weight: 99.4 kg 99.2 kg 97.8 kg    Examination:  General exam: Obese chronically ill male sitting up in bed, AAOx3 HEENT: No JVD CVS: S1-S2, regular rhythm Lungs: Clear bilaterally Abdomen: Firm, mildly distended,  minimal tenderness, bowel sounds present Extremities: No edema  Skin: No rashes Psychiatry:  Mood & affect appropriate.     Data Reviewed:   CBC: Recent Labs  Lab 04/12/23 0238  04/13/23 0227 04/14/23 1250 04/14/23 2055 04/15/23 0224  WBC 9.7 8.3 7.3 7.8 7.3  HGB 8.3* 7.7* 7.6* 7.8* 6.6*  HCT 25.4* 23.5* 23.6* 23.6* 20.0*  MCV 91.0 90.4 91.5 94.0 91.3  PLT 385 346 400 368 337   Basic Metabolic Panel: Recent Labs  Lab 04/09/23 0248 04/10/23 0238 04/11/23 0314 04/12/23 0238 04/13/23 0227 04/14/23 1250 04/15/23 0224  NA 130*   < > 129* 129* 132* 135 134*  K 3.8   < > 3.9 4.1 4.2 3.9 3.1*  CL 103   < > 100 98 98 102 99  CO2 19*   < > 22 23 25 26 23   GLUCOSE 66*   < > 100* 89 110* 106* 121*  BUN 11   < > 8 8 9 9 8   CREATININE 0.85   < > 0.85 0.84 0.96 0.92 0.86  CALCIUM 6.6*   < > 7.7* 8.0* 8.0* 8.1* 7.6*  MG 1.7  --   --   --   --   --   --    < > = values in this interval not displayed.   GFR: Estimated Creatinine Clearance: 106.9 mL/min (by C-G formula based on SCr of 0.86 mg/dL). Liver Function Tests: Recent Labs  Lab 04/09/23 0248 04/11/23 0314 04/14/23 1250  AST 21 29 26   ALT 14 20 19   ALKPHOS 108 126 117  BILITOT 0.7 0.9 0.7  PROT 4.6* 4.7* 5.4*  ALBUMIN 1.6* 1.7* 2.0*   Recent Labs  Lab 04/14/23 1250  LIPASE 118*    No results for input(s): "AMMONIA" in the last 168 hours. Coagulation Profile: No results for input(s): "INR", "PROTIME" in the last 168 hours. Cardiac Enzymes: No results for input(s): "CKTOTAL", "CKMB", "CKMBINDEX", "TROPONINI" in the last 168 hours. BNP (last 3 results) No results for input(s): "PROBNP" in the last 8760 hours. HbA1C: No results for input(s): "HGBA1C" in the last 72 hours. CBG: Recent Labs  Lab 04/10/23 2111  GLUCAP 129*   Lipid Profile: No results for input(s): "CHOL", "HDL", "LDLCALC", "TRIG", "CHOLHDL", "LDLDIRECT" in the last 72 hours. Thyroid Function Tests: No results for input(s): "TSH", "T4TOTAL", "FREET4", "T3FREE", "THYROIDAB" in the last 72 hours. Anemia Panel: Recent Labs    04/13/23 0841  VITAMINB12 211  FOLATE 20.7  FERRITIN 433*  TIBC 102*  IRON 12*  RETICCTPCT  3.7*   Urine analysis:    Component Value Date/Time   COLORURINE YELLOW 04/07/2023 0512   APPEARANCEUR CLEAR 04/07/2023 0512   LABSPEC 1.034 (H) 04/07/2023 0512   PHURINE 5.0 04/07/2023 0512   GLUCOSEU NEGATIVE 04/07/2023 0512   HGBUR SMALL (A) 04/07/2023 0512   BILIRUBINUR NEGATIVE 04/07/2023 0512   KETONESUR NEGATIVE 04/07/2023 0512   PROTEINUR NEGATIVE 04/07/2023 0512   NITRITE NEGATIVE 04/07/2023 0512   LEUKOCYTESUR NEGATIVE 04/07/2023 0512   Sepsis Labs: @LABRCNTIP (procalcitonin:4,lacticidven:4)  )No results found for this or any previous visit (from the past 240 hour(s)).   Radiology Studies: ECHOCARDIOGRAM COMPLETE  Result Date: 04/13/2023    ECHOCARDIOGRAM REPORT   Patient Name:   Luis Marquez Date of Exam: 04/13/2023 Medical Rec #:  [id number]    Height:       73.0 in Accession #:    [id number]   Weight:       219.1 lb Date of  Birth:  Dec 29, 1960    BSA:          2.237 m Patient Age:    62 years     BP:           122/88 mmHg Patient Gender: M            HR:           97 bpm. Exam Location:  Inpatient Procedure: 2D Echo, Cardiac Doppler and Color Doppler Indications:    CHF-Acute Systolic I50.21  History:        Patient has prior history of Echocardiogram examinations, most                 recent 11/04/2021.  Sonographer:    Harriette Bouillon RDCS Referring Phys: [phone] Madoc Holquin IMPRESSIONS  1. Left ventricular ejection fraction, by estimation, is 60 to 65%. The left ventricle has normal function. The left ventricle has no regional wall motion abnormalities. Left ventricular diastolic parameters are consistent with Grade I diastolic dysfunction (impaired relaxation).  2. Right ventricular systolic function is normal. The right ventricular size is normal. Tricuspid regurgitation signal is inadequate for assessing PA pressure.  3. A small pericardial effusion is present. The pericardial effusion is circumferential.  4. The mitral valve is normal in structure. No evidence of mitral valve  regurgitation. No evidence of mitral stenosis.  5. The aortic valve is normal in structure. Aortic valve regurgitation is not visualized. No aortic stenosis is present.  6. The inferior vena cava is normal in size with greater than 50% respiratory variability, suggesting right atrial pressure of 3 mmHg. FINDINGS  Left Ventricle: Left ventricular ejection fraction, by estimation, is 60 to 65%. The left ventricle has normal function. The left ventricle has no regional wall motion abnormalities. The left ventricular internal cavity size was normal in size. There is  no left ventricular hypertrophy. Left ventricular diastolic parameters are consistent with Grade I diastolic dysfunction (impaired relaxation). Right Ventricle: The right ventricular size is normal. No increase in right ventricular wall thickness. Right ventricular systolic function is normal. Tricuspid regurgitation signal is inadequate for assessing PA pressure. Left Atrium: Left atrial size was normal in size. Right Atrium: Right atrial size was normal in size. Pericardium: A small pericardial effusion is present. The pericardial effusion is circumferential. Presence of epicardial fat layer. Mitral Valve: The mitral valve is normal in structure. No evidence of mitral valve regurgitation. No evidence of mitral valve stenosis. Tricuspid Valve: The tricuspid valve is normal in structure. Tricuspid valve regurgitation is not demonstrated. No evidence of tricuspid stenosis. Aortic Valve: The aortic valve is normal in structure. Aortic valve regurgitation is not visualized. No aortic stenosis is present. Pulmonic Valve: The pulmonic valve was normal in structure. Pulmonic valve regurgitation is not visualized. No evidence of pulmonic stenosis. Aorta: The aortic root is normal in size and structure. Venous: The inferior vena cava is normal in size with greater than 50% respiratory variability, suggesting right atrial pressure of 3 mmHg. IAS/Shunts: No atrial  level shunt detected by color flow Doppler.  LEFT VENTRICLE PLAX 2D LVIDd:         4.20 cm Diastology LVIDs:         3.20 cm LV e' medial:    8.38 cm/s LV PW:         0.90 cm LV E/e' medial:  8.7 LV IVS:        1.00 cm LV e' lateral:   7.40 cm/s  LV E/e' lateral: 9.8  IVC IVC diam: 2.00 cm LEFT ATRIUM             Index LA diam:        3.00 cm 1.34 cm/m LA Vol (A2C):   49.7 ml 22.22 ml/m LA Vol (A4C):   26.2 ml 11.71 ml/m LA Biplane Vol: 36.3 ml 16.23 ml/m  AORTIC VALVE LVOT Vmax:   115.00 cm/s LVOT Vmean:  65.800 cm/s LVOT VTI:    0.181 m MITRAL VALVE MV Area (PHT): 3.83 cm    SHUNTS MV Decel Time: 198 msec    Systemic VTI: 0.18 m MV E velocity: 72.80 cm/s MV A velocity: 92.50 cm/s MV E/A ratio:  0.79 Kardie Tobb DO Electronically signed by Thomasene Ripple DO Signature Date/Time: 04/13/2023/4:19:49 PM    Final      Scheduled Meds:  mometasone-formoterol  2 puff Inhalation BID   multivitamin with minerals   Oral Daily   pantoprazole (PROTONIX) IV  40 mg Intravenous Q12H   Continuous Infusions:   LOS: 9 days    Time spent:    Zannie Cove, MD [facility]   04/15/2023, 10:04 AM

## 2023-04-15 NOTE — Progress Notes (Signed)
Critical result:    04/15/23 0409  Provider Notification  Provider Name/Title A. Virgel Manifold, NP  Date Provider Notified 04/15/23  Time Provider Notified [phone]  Method of Notification  (secure chat)  Notification Reason Critical Result  Test performed and critical result HGB 6.6  Date Critical Result Received 04/15/23  Time Critical Result Received 0357  Provider response See new orders (To transfuse 1 unit of RBC)  Date of Provider Response 04/15/23  Time of Provider Response [phone]

## 2023-04-15 NOTE — Progress Notes (Signed)
    Patient Name: Luis Marquez           DOB: 1960/09/02  MRN: [id number]      Admission Date: 04/06/2023  Attending Provider: Zannie Cove, MD  Primary Diagnosis: Acute pancreatitis   Level of care: Med-Surg    CROSS COVER NOTE   Date of Service   04/15/2023   Luis Marquez, 62 y.o. male, was admitted on 04/06/2023 for Acute pancreatitis.    HPI/Events of Note   Acute Blood Loss Anemia, hematochezia  Hemoglobin 7.8 -->  6.6.  No acute changes reported.  Hemodynamically stable. Hematochezia, He has 1 BM tonight with small amount of blood. No other bleeding reported tonight.    Interventions/ Plan   Blood transfusion, 1 unit PRBC        Luis Harada, DNP, [employer] [facility] [place]

## 2023-04-16 DIAGNOSIS — K859 Acute pancreatitis without necrosis or infection, unspecified: Secondary | ICD-10-CM | POA: Diagnosis not present

## 2023-04-16 LAB — CBC
HCT: 22.7 % — ABNORMAL LOW (ref 39.0–52.0)
Hemoglobin: 7.4 g/dL — ABNORMAL LOW (ref 13.0–17.0)
MCH: 29.7 pg (ref 26.0–34.0)
MCHC: 32.6 g/dL (ref 30.0–36.0)
MCV: 91.2 fL (ref 80.0–100.0)
Platelets: 318 10*3/uL (ref 150–400)
RBC: 2.49 MIL/uL — ABNORMAL LOW (ref 4.22–5.81)
RDW: 14.4 % (ref 11.5–15.5)
WBC: 8.5 10*3/uL (ref 4.0–10.5)
nRBC: 0 % (ref 0.0–0.2)

## 2023-04-16 LAB — TYPE AND SCREEN
ABO/RH(D): O POS
Antibody Screen: NEGATIVE
Unit division: 0

## 2023-04-16 LAB — COMPREHENSIVE METABOLIC PANEL
ALT: 17 U/L (ref 0–44)
AST: 22 U/L (ref 15–41)
Albumin: 1.8 g/dL — ABNORMAL LOW (ref 3.5–5.0)
Alkaline Phosphatase: 96 U/L (ref 38–126)
Anion gap: 5 (ref 5–15)
BUN: 5 mg/dL — ABNORMAL LOW (ref 8–23)
CO2: 24 mmol/L (ref 22–32)
Calcium: 7.4 mg/dL — ABNORMAL LOW (ref 8.9–10.3)
Chloride: 103 mmol/L (ref 98–111)
Creatinine, Ser: 0.73 mg/dL (ref 0.61–1.24)
GFR, Estimated: >60 mL/min (ref >60)
Glucose, Bld: 88 mg/dL (ref 70–99)
Potassium: 3.6 mmol/L (ref 3.5–5.1)
Sodium: 132 mmol/L — ABNORMAL LOW (ref 135–145)
Total Bilirubin: 0.7 mg/dL (ref 0.3–1.2)
Total Protein: 4.3 g/dL — ABNORMAL LOW (ref 6.5–8.1)

## 2023-04-16 LAB — BPAM RBC
Blood Product Expiration Date: 202410262359
ISSUE DATE / TIME: 202409280606
Unit Type and Rh: 5100

## 2023-04-16 MED ORDER — SODIUM CHLORIDE 0.9 % IV SOLN: INTRAVENOUS | Status: DC

## 2023-04-16 NOTE — Progress Notes (Signed)
Subjective: No acute events.  Feeling well.  No reports of any hematochezia.  Objective: Vital signs in last 24 hours: Temp:  [98.2 F (36.8 C)-98.7 F (37.1 C)] 98.5 F (36.9 C) (09/29 0922) Pulse Rate:  [76-94] 86 (09/29 0922) Resp:  [17-19] 18 (09/29 0922) BP: (98-114)/(65-78) 114/73 (09/29 0922) SpO2:  [93 %-100 %] 98 % (09/29 0922) Weight:  [99.5 kg] 99.5 kg (09/29 0500) Last BM Date : 04/14/23  Intake/Output from previous day: 09/28 0701 - 09/29 0700 In: 1632 [P.O.:1320; Blood:312] Out: 400 [Urine:400] Intake/Output this shift: No intake/output data recorded.  General appearance: alert and no distress GI: soft, non-tender; bowel sounds normal; no masses,  no organomegaly  Lab Results: Recent Labs    04/15/23 0224 04/15/23 1151 04/16/23 0238  WBC 7.3 7.3 8.5  HGB 6.6* 7.4* 7.4*  HCT 20.0* 22.4* 22.7*  PLT 337 340 318   BMET Recent Labs    04/14/23 1250 04/15/23 0224 04/16/23 0238  NA 135 134* 132*  K 3.9 3.1* 3.6  CL 102 99 103  CO2 26 23 24   GLUCOSE 106* 121* 88  BUN 9 8 5*  CREATININE 0.92 0.86 0.73  CALCIUM 8.1* 7.6* 7.4*   LFT Recent Labs    04/16/23 0238  PROT 4.3*  ALBUMIN 1.8*  AST 22  ALT 17  ALKPHOS 96  BILITOT 0.7   PT/INR No results for input(s): "LABPROT", "INR" in the last 72 hours. Hepatitis Panel No results for input(s): "HEPBSAG", "HCVAB", "HEPAIGM", "HEPBIGM" in the last 72 hours. C-Diff No results for input(s): "CDIFFTOX" in the last 72 hours. Fecal Lactopherrin No results for input(s): "FECLLACTOFRN" in the last 72 hours.  Studies/Results: CT Angio Abd/Pel w/ and/or w/o  Result Date: 04/16/2023 CLINICAL DATA:  Lower GI bleeding.  Acute pancreatitis. EXAM: CTA ABDOMEN AND PELVIS WITHOUT AND WITH CONTRAST TECHNIQUE: Multidetector CT imaging of the abdomen and pelvis was performed using the standard protocol during bolus administration of intravenous contrast. Multiplanar reconstructed images and MIPs were obtained and  reviewed to evaluate the vascular anatomy. RADIATION DOSE REDUCTION: This exam was performed according to the departmental dose-optimization program which includes automated exposure control, adjustment of the mA and/or kV according to patient size and/or use of iterative reconstruction technique. CONTRAST:  OMNIPAQUE IOHEXOL 350 MG/ML SOLN COMPARISON:  Prior CT scan of the abdomen and pelvis 04/06/2023 FINDINGS: VASCULAR Aorta: Irregular aneurysmal dilation of the infrarenal abdominal aortic with significant multifocal penetrating atherosclerotic ulcerations. The aorta is tortuous. Maximal aortic diameter is 3.6 cm (see coronal reformatted images). Celiac: Patent without evidence of aneurysm, dissection, vasculitis or significant stenosis. SMA: Patent without evidence of aneurysm, dissection, vasculitis or significant stenosis. Renals: Both renal arteries are patent without evidence of aneurysm, dissection, vasculitis, fibromuscular dysplasia or significant stenosis. Small accessory artery to the upper pole of the left kidney. IMA: Patent without evidence of aneurysm, dissection, vasculitis or significant stenosis. Inflow: Patent without evidence of aneurysm, dissection, vasculitis or significant stenosis. Proximal Outflow: Bilateral common femoral and visualized portions of the superficial and profunda femoral arteries are patent without evidence of aneurysm, dissection, vasculitis or significant stenosis. Veins: No focal venous abnormality. Review of the MIP images confirms the above findings. NON-VASCULAR Lower chest: No acute abnormality. Mild centrilobular pulmonary emphysema. Hepatobiliary: Low attenuation of the hepatic parenchyma with sparing adjacent to the gallbladder fossa consistent with steatosis. Normal hepatic contour and morphology. No discrete hepatic lesions. Normal appearance of the gallbladder. No intra or extrahepatic biliary ductal dilatation. Pancreas: Progressive acute severe  pancreatitis with increased edema and stranding along the pancreatic tail and head compared to the prior imaging. Rounded focus of higher attenuation posterior to the pancreatic head measures 2.1 x 1.9 cm and likely represents a focus of hematoma in the setting of hemorrhagic pancreatitis. No visible pseudoaneurysm or active bleeding. Spleen: Normal in size without focal abnormality. Adrenals/Urinary Tract: Adrenal glands are unremarkable. Kidneys are normal, without renal calculi, focal solid lesion, or hydronephrosis. Bladder is unremarkable. Benign cyst exophytic from the medial lower pole of the right kidney. No imaging follow-up is recommended. Stomach/Bowel: Colonic diverticular disease without CT evidence of active inflammation. No evidence of active lower GI bleeding. The appendix is normal. Secondary reactive thickening is present of the duodenum in the second and third portions secondary to the underlying pancreatitis. Lymphatic: No suspicious lymphadenopathy. Reproductive: Prostate is unremarkable. Other: No abdominal wall hernia or abnormality. No abdominopelvic ascites. Musculoskeletal: No acute or significant osseous findings. Stable chronic T12 compression fracture. IMPRESSION: VASCULAR 1. No evidence of active GI bleeding at the time of this scan. 2. No evidence of arterial pseudoaneurysm or venous thrombosis as a complication from acute pancreatitis. 3. Irregular aneurysmal dilation of the infrarenal abdominal aorta with highly irregular/ulcerated atherosclerotic plaque and multifocal penetrating atherosclerotic ulcerations. Maximal aortic diameter is 3.6 cm. Recommend follow-up ultrasound every 3 years. (Ref.: J Vasc Surg. 2018; 67:2-77 and J Am Coll Radiol 2013;10(10):789-794.) NON-VASCULAR 1. Progressive acute pancreatitis with increased inflammatory changes about the pancreatic head and new involvement of the pancreatic tail. Additionally, findings suggest hemorrhagic pancreatitis with  development of a rounded 2.1 x 1.9 cm high attenuation focus posterior and inferior to the pancreatic head likely representing a region of hematoma. A smaller focus of probable bleeding was present on the prior study from 04/06/2023. Importantly, no definite pseudoaneurysm or active bleeding was visible on the arterial phase images. The close proximity of this focus of hemorrhagic pancreatitis to the immediately adjacent duodenum could result in some manifestation of GI bleeding. 2. Additional ancillary findings as above including mild centrilobular pulmonary emphysema, hepatic steatosis, and extensive colonic diverticulosis. Aortic aneurysm NOS (ICD10-I71.9); Aortic Atherosclerosis (ICD10-I70.0) and Emphysema (ICD10-J43.9). Signed, Sterling Big, MD, RPVI [facility]iology Electronically Signed   By: Malachy Moan M.D.   On: 04/16/2023 08:00    Medications: Scheduled:  mometasone-formoterol  2 puff Inhalation BID   multivitamin with minerals   Oral Daily   pantoprazole (PROTONIX) IV  40 mg Intravenous Q12H   Continuous:  Assessment/Plan: 1) Hemorrhagic acute ETOH pancreatitis. 2) Anemia - stable. 3) Hematochezia.   The patient remains stable.  The CTA from last evening does show progression of his pancreatitis, but there was no evidence of a pseudoaneurysm in the pancreas or an arterial bleeding in the pancreas.  Discussion with Dr. Archer Asa, IR, suggests some slow venous bleeding or oozing.  This was most likely compounded with the reinitiation of his anticoagulation.  The prior hematochezia that he complained about before his admission, starting in June, was not from the pancreas.  It was probably his hemorrhoids or possibly minor diverticular bleeding.  Plan: 1) Continue to hold anticoagulation. 2) Follow HGB and transfuse as necessary. 3) Supportive care. 4) [facility] will resume care in the AM.  LOS: 10 days   Clearence Vitug  D 04/16/2023, 11:35 AM

## 2023-04-16 NOTE — Plan of Care (Signed)
  Problem: Safety: Goal: Ability to remain free from injury will improve Outcome: Not Progressing   Problem: Elimination: Goal: Will not experience complications related to bowel motility Outcome: Not Progressing   Problem: Clinical Measurements: Goal: Diagnostic test results will improve Outcome: Not Progressing

## 2023-04-16 NOTE — Progress Notes (Signed)
PROGRESS NOTE    Luis Marquez  GNF:[id number] DOB: [birth year] DOA: 04/06/2023 PCP: Claiborne Rigg, NP  Luis Marquez is a 62 y.o. M with HFrEF EF 20-25%, pAF on Eliquis, COPD, alcohol use and hx DTs, and CAD who presented with epigastric pain. History of intermittent hematochezia for months, had a colonoscopy 5/24, multiple polyps removed In the ER, CT showed pancreatitis, possible pseudoaneurysm,  abdominal aortic aneurysm now measuring up to 3.7 cm. There is also increasing changes of a penetrating enhancing ulcer along the mural plaque and thrombus along the lower abdominal Aorta.  Lipase >1000.  -Seen by IR, recommended supportive care on admission -Pancreatitis slowly improving, diet advanced -9/27 with multiple episodes of hematochezia -9/28, hemoglobin down to 6.6, transfusing, obtain CTA, GI input requested  Subjective: -Feels okay, no further bleeding, mild abdominal discomfort  Assessment and Plan:   Acute pancreatitis ?  pseudoaneurysm -Prior heavy alcoholism, quit few weeks ago, ultrasound negative for cholelithiasis -CT 9/19  noted focal 8 mm area of hyperenhancement posterior to the pancreatic head and uncinate worrisome for developing pseudoaneurysm. -Vascular recommended IR eval on admission, seen by IR felt to be hemodynamically stable without active bleeding initially -Treated with supportive care, was improving, tolerating soft diet, then developed multiple episodes of hematochezia 9/27, hemoglobin down to 6.6, repeat CT with progression of pancreatitis with 2 cm area of focus in head of pancreas, likely hematoma -Clears, supportive care restart IV fluids, caution with volume overload given known CHF and cirrhosis  Anemia Hematochezia Baseline normal 3 months ago ~13 g/dL.   history of ongoing hematochezia intermittently for months, last colonoscopy 6/24 multiple polyps were removed, diverticulosis -Eliquis resumed by my partner 4 days ago, held 9/27 with  recurrent hematochezia -3-4 episodes of hematochezia 9/27, none since then, transfuse PRBC yesterday, hemoglobin stabilizing, continue to trend  AAA with ulcer and mural plaque, thrombus - Discussed with vascular surgery Dr. Myra Gianotti, recommended blood pressure control, outpatient follow-up in few months -BP stable  Hyponatremia Mild, asymptomatic  Hypomagnesemia Supplemented  Paroxysmal atrial fibrillation (HCC) HR controlled - Continue Eliquis - Metoprolol on hold  Chronic systolic CHF (congestive heart failure) (HCC) -Repeat echo with normalization of EF, remains euvolemic  AKI (acute kidney injury) (HCC) Had AKI with Cr up to 3 mg/dL 2 weeks PTA in the setting of ARB use and diarrhea and hypotension.  Essential hypertension BP normal off meds - Hold losartan  Emphysema lung (HCC) - Continue ICS/LABA  Hypocalcemia Supplemented, now corrects with albumin  Severe hypoalbuminemia -Suspect chronic liver disease from EtOH abuse  DVT prophylaxis: SCDs Code Status: Full code Family Communication: None present Disposition Plan: To be determined  Consultants:    Procedures:   Antimicrobials:    Objective: Vitals:   04/15/23 2000 04/16/23 0441 04/16/23 0500 04/16/23 0922  BP: 110/72 98/65  114/73  Pulse: 94 86  86  Resp: 19 18  18   Temp: 98.4 F (36.9 C) 98.2 F (36.8 C)  98.5 F (36.9 C)  TempSrc: Oral Oral  Oral  SpO2: 95% 100%  98%  Weight:   99.5 kg   Height:        Intake/Output Summary (Last 24 hours) at 04/16/2023 1200 Last data filed at 04/16/2023 0500 Gross per 24 hour  Intake 840 ml  Output 400 ml  Net 440 ml    Filed Weights   04/14/23 0412 04/15/23 0413 04/16/23 0500  Weight: 99.2 kg 97.8 kg 99.5 kg    Examination:  General exam: Obese chronically ill  male sitting up in bed, AAOx3 HEENT: No JVD CVS: S1-S2, regular rhythm Lungs: Clear bilaterally Abdomen: Firm, less distended, mild tenderness in epigastrium, bowel sounds present   Extremities: No edema  Skin: No rashes Psychiatry:  Mood & affect appropriate.     Data Reviewed:   CBC: Recent Labs  Lab 04/14/23 1250 04/14/23 2055 04/15/23 0224 04/15/23 1151 04/16/23 0238  WBC 7.3 7.8 7.3 7.3 8.5  HGB 7.6* 7.8* 6.6* 7.4* 7.4*  HCT 23.6* 23.6* 20.0* 22.4* 22.7*  MCV 91.5 94.0 91.3 89.2 91.2  PLT 400 368 337 340 318   Basic Metabolic Panel: Recent Labs  Lab 04/12/23 0238 04/13/23 0227 04/14/23 1250 04/15/23 0224 04/16/23 0238  NA 129* 132* 135 134* 132*  K 4.1 4.2 3.9 3.1* 3.6  CL 98 98 102 99 103  CO2 23 25 26 23 24   GLUCOSE 89 110* 106* 121* 88  BUN 8 9 9 8  5*  CREATININE 0.84 0.96 0.92 0.86 0.73  CALCIUM 8.0* 8.0* 8.1* 7.6* 7.4*   GFR: Estimated Creatinine Clearance: 115.7 mL/min (by C-G formula based on SCr of 0.73 mg/dL). Liver Function Tests: Recent Labs  Lab 04/11/23 0314 04/14/23 1250 04/16/23 0238  AST 29 26 22   ALT 20 19 17   ALKPHOS 126 117 96  BILITOT 0.9 0.7 0.7  PROT 4.7* 5.4* 4.3*  ALBUMIN 1.7* 2.0* 1.8*   Recent Labs  Lab 04/14/23 1250  LIPASE 118*    No results for input(s): "AMMONIA" in the last 168 hours. Coagulation Profile: No results for input(s): "INR", "PROTIME" in the last 168 hours. Cardiac Enzymes: No results for input(s): "CKTOTAL", "CKMB", "CKMBINDEX", "TROPONINI" in the last 168 hours. BNP (last 3 results) No results for input(s): "PROBNP" in the last 8760 hours. HbA1C: No results for input(s): "HGBA1C" in the last 72 hours. CBG: Recent Labs  Lab 04/10/23 2111  GLUCAP 129*   Lipid Profile: No results for input(s): "CHOL", "HDL", "LDLCALC", "TRIG", "CHOLHDL", "LDLDIRECT" in the last 72 hours. Thyroid Function Tests: No results for input(s): "TSH", "T4TOTAL", "FREET4", "T3FREE", "THYROIDAB" in the last 72 hours. Anemia Panel: No results for input(s): "VITAMINB12", "FOLATE", "FERRITIN", "TIBC", "IRON", "RETICCTPCT" in the last 72 hours.  Urine analysis:    Component Value Date/Time    COLORURINE YELLOW 04/07/2023 0512   APPEARANCEUR CLEAR 04/07/2023 0512   LABSPEC 1.034 (H) 04/07/2023 0512   PHURINE 5.0 04/07/2023 0512   GLUCOSEU NEGATIVE 04/07/2023 0512   HGBUR SMALL (A) 04/07/2023 0512   BILIRUBINUR NEGATIVE 04/07/2023 0512   KETONESUR NEGATIVE 04/07/2023 0512   PROTEINUR NEGATIVE 04/07/2023 0512   NITRITE NEGATIVE 04/07/2023 0512   LEUKOCYTESUR NEGATIVE 04/07/2023 0512   Sepsis Labs: @LABRCNTIP (procalcitonin:4,lacticidven:4)  )No results found for this or any previous visit (from the past 240 hour(s)).   Radiology Studies: CT Angio Abd/Pel w/ and/or w/o  Result Date: 04/16/2023 CLINICAL DATA:  Lower GI bleeding.  Acute pancreatitis. EXAM: CTA ABDOMEN AND PELVIS WITHOUT AND WITH CONTRAST TECHNIQUE: Multidetector CT imaging of the abdomen and pelvis was performed using the standard protocol during bolus administration of intravenous contrast. Multiplanar reconstructed images and MIPs were obtained and reviewed to evaluate the vascular anatomy. RADIATION DOSE REDUCTION: This exam was performed according to the departmental dose-optimization program which includes automated exposure control, adjustment of the mA and/or kV according to patient size and/or use of iterative reconstruction technique. CONTRAST:  OMNIPAQUE IOHEXOL 350 MG/ML SOLN COMPARISON:  Prior CT scan of the abdomen and pelvis 04/06/2023 FINDINGS: VASCULAR Aorta: Irregular aneurysmal dilation of  the infrarenal abdominal aortic with significant multifocal penetrating atherosclerotic ulcerations. The aorta is tortuous. Maximal aortic diameter is 3.6 cm (see coronal reformatted images). Celiac: Patent without evidence of aneurysm, dissection, vasculitis or significant stenosis. SMA: Patent without evidence of aneurysm, dissection, vasculitis or significant stenosis. Renals: Both renal arteries are patent without evidence of aneurysm, dissection, vasculitis, fibromuscular dysplasia or significant stenosis.  Small accessory artery to the upper pole of the left kidney. IMA: Patent without evidence of aneurysm, dissection, vasculitis or significant stenosis. Inflow: Patent without evidence of aneurysm, dissection, vasculitis or significant stenosis. Proximal Outflow: Bilateral common femoral and visualized portions of the superficial and profunda femoral arteries are patent without evidence of aneurysm, dissection, vasculitis or significant stenosis. Veins: No focal venous abnormality. Review of the MIP images confirms the above findings. NON-VASCULAR Lower chest: No acute abnormality. Mild centrilobular pulmonary emphysema. Hepatobiliary: Low attenuation of the hepatic parenchyma with sparing adjacent to the gallbladder fossa consistent with steatosis. Normal hepatic contour and morphology. No discrete hepatic lesions. Normal appearance of the gallbladder. No intra or extrahepatic biliary ductal dilatation. Pancreas: Progressive acute severe pancreatitis with increased edema and stranding along the pancreatic tail and head compared to the prior imaging. Rounded focus of higher attenuation posterior to the pancreatic head measures 2.1 x 1.9 cm and likely represents a focus of hematoma in the setting of hemorrhagic pancreatitis. No visible pseudoaneurysm or active bleeding. Spleen: Normal in size without focal abnormality. Adrenals/Urinary Tract: Adrenal glands are unremarkable. Kidneys are normal, without renal calculi, focal solid lesion, or hydronephrosis. Bladder is unremarkable. Benign cyst exophytic from the medial lower pole of the right kidney. No imaging follow-up is recommended. Stomach/Bowel: Colonic diverticular disease without CT evidence of active inflammation. No evidence of active lower GI bleeding. The appendix is normal. Secondary reactive thickening is present of the duodenum in the second and third portions secondary to the underlying pancreatitis. Lymphatic: No suspicious lymphadenopathy. Reproductive:  Prostate is unremarkable. Other: No abdominal wall hernia or abnormality. No abdominopelvic ascites. Musculoskeletal: No acute or significant osseous findings. Stable chronic T12 compression fracture. IMPRESSION: VASCULAR 1. No evidence of active GI bleeding at the time of this scan. 2. No evidence of arterial pseudoaneurysm or venous thrombosis as a complication from acute pancreatitis. 3. Irregular aneurysmal dilation of the infrarenal abdominal aorta with highly irregular/ulcerated atherosclerotic plaque and multifocal penetrating atherosclerotic ulcerations. Maximal aortic diameter is 3.6 cm. Recommend follow-up ultrasound every 3 years. (Ref.: J Vasc Surg. 2018; 67:2-77 and J Am Coll Radiol 2013;10(10):789-794.) NON-VASCULAR 1. Progressive acute pancreatitis with increased inflammatory changes about the pancreatic head and new involvement of the pancreatic tail. Additionally, findings suggest hemorrhagic pancreatitis with development of a rounded 2.1 x 1.9 cm high attenuation focus posterior and inferior to the pancreatic head likely representing a region of hematoma. A smaller focus of probable bleeding was present on the prior study from 04/06/2023. Importantly, no definite pseudoaneurysm or active bleeding was visible on the arterial phase images. The close proximity of this focus of hemorrhagic pancreatitis to the immediately adjacent duodenum could result in some manifestation of GI bleeding. 2. Additional ancillary findings as above including mild centrilobular pulmonary emphysema, hepatic steatosis, and extensive colonic diverticulosis. Aortic aneurysm NOS (ICD10-I71.9); Aortic Atherosclerosis (ICD10-I70.0) and Emphysema (ICD10-J43.9). Signed, Sterling Big, MD, RPVI [facility] Electronically Signed   By: Malachy Moan M.D.   On: 04/16/2023 08:00     Scheduled Meds:  mometasone-formoterol  2 puff Inhalation BID   multivitamin with  minerals   Oral Daily   pantoprazole (PROTONIX) IV  40 mg Intravenous Q12H   Continuous Infusions:   LOS: 10 days    Time spent:    Zannie Cove, MD [facility]   04/16/2023, 12:00 PM

## 2023-04-16 NOTE — Plan of Care (Signed)

## 2023-04-17 DIAGNOSIS — K921 Melena: Secondary | ICD-10-CM

## 2023-04-17 DIAGNOSIS — K852 Alcohol induced acute pancreatitis without necrosis or infection: Secondary | ICD-10-CM | POA: Diagnosis not present

## 2023-04-17 DIAGNOSIS — K859 Acute pancreatitis without necrosis or infection, unspecified: Secondary | ICD-10-CM | POA: Diagnosis not present

## 2023-04-17 LAB — COMPREHENSIVE METABOLIC PANEL
ALT: 18 U/L (ref 0–44)
AST: 22 U/L (ref 15–41)
Albumin: 1.8 g/dL — ABNORMAL LOW (ref 3.5–5.0)
Alkaline Phosphatase: 93 U/L (ref 38–126)
Anion gap: 7 (ref 5–15)
BUN: 5 mg/dL — ABNORMAL LOW (ref 8–23)
CO2: 21 mmol/L — ABNORMAL LOW (ref 22–32)
Calcium: 7.5 mg/dL — ABNORMAL LOW (ref 8.9–10.3)
Chloride: 107 mmol/L (ref 98–111)
Creatinine, Ser: 0.72 mg/dL (ref 0.61–1.24)
GFR, Estimated: >60 mL/min (ref >60)
Glucose, Bld: 95 mg/dL (ref 70–99)
Potassium: 3.4 mmol/L — ABNORMAL LOW (ref 3.5–5.1)
Sodium: 135 mmol/L (ref 135–145)
Total Bilirubin: 0.7 mg/dL (ref 0.3–1.2)
Total Protein: 4.3 g/dL — ABNORMAL LOW (ref 6.5–8.1)

## 2023-04-17 LAB — CBC
HCT: 24.2 % — ABNORMAL LOW (ref 39.0–52.0)
Hemoglobin: 8 g/dL — ABNORMAL LOW (ref 13.0–17.0)
MCH: 30.8 pg (ref 26.0–34.0)
MCHC: 33.1 g/dL (ref 30.0–36.0)
MCV: 93.1 fL (ref 80.0–100.0)
Platelets: 320 10*3/uL (ref 150–400)
RBC: 2.6 MIL/uL — ABNORMAL LOW (ref 4.22–5.81)
RDW: 14.2 % (ref 11.5–15.5)
WBC: 8.4 10*3/uL (ref 4.0–10.5)
nRBC: 0 % (ref 0.0–0.2)

## 2023-04-17 LAB — LIPASE, BLOOD: Lipase: 82 U/L — ABNORMAL HIGH (ref 11–51)

## 2023-04-17 NOTE — Plan of Care (Signed)

## 2023-04-17 NOTE — Progress Notes (Addendum)
PROGRESS NOTE    Luis Marquez  IEP:[id number] DOB: Jul 29, 1960 DOA: 04/06/2023 PCP: Claiborne Rigg, NP  Luis Marquez is a 62 y.o. M with HFrEF EF 20-25%, pAF on Eliquis, COPD, alcohol use and hx DTs, and CAD who presented with epigastric pain. History of intermittent hematochezia for months, had a colonoscopy 5/24, multiple polyps removed In the ER, CT showed pancreatitis, possible pseudoaneurysm,  abdominal aortic aneurysm now measuring up to 3.7 cm. There is also increasing changes of a penetrating enhancing ulcer along the mural plaque and thrombus along the lower abdominal Aorta.  Lipase >1000.  -Seen by IR, recommended supportive care on admission -Pancreatitis slowly improving, diet advanced -9/27 with multiple episodes of hematochezia -9/28, hemoglobin down to 6.6, transfusing, obtain CTA, GI consulted  Subjective: -No hematochezia yesterday, continues to have some abdominal discomfort  Assessment and Plan:   Acute pancreatitis ?  pseudoaneurysm -Prior heavy alcoholism, quit few weeks ago, ultrasound negative for cholelithiasis -CT 9/19  noted focal 8 mm area of hyperenhancement posterior to the pancreatic head and uncinate worrisome for developing pseudoaneurysm. -Vascular recommended IR eval on admission, seen by IR  -Treated with supportive care, was improving, tolerating soft diet, then developed multiple episodes of hematochezia 9/27, hemoglobin down to 6.6, repeat CT with progression of pancreatitis with 2 cm area of focus in head of pancreas, likely hematoma -Continue clears, supportive care, gentle IV fluids, advance diet tomorrow if stable  Anemia Hematochezia Baseline normal 3 months ago ~13 g/dL.   history of ongoing hematochezia intermittently for months, last colonoscopy 6/24 multiple polyps were removed, diverticulosis -Eliquis resumed by my partner 4 days ago, held 9/27 with recurrent hematochezia -3-4 episodes of hematochezia 9/27, none since then,  transfused PRBC 9/28, hemoglobin stabilizing, continue to trend  AAA with ulcer and mural plaque, thrombus - Discussed with vascular surgery Dr. Myra Gianotti, imaging reviewed, recommended blood pressure control, outpatient follow-up in few months -BP stable  Hyponatremia Mild, asymptomatic  Hypomagnesemia Supplemented  Paroxysmal atrial fibrillation (HCC) HR controlled - Continue Eliquis - Metoprolol on hold  Chronic systolic CHF (congestive heart failure) (HCC) -Repeat echo with normalization of EF, remains euvolemic  AKI (acute kidney injury) (HCC) Had AKI with Cr up to 3 mg/dL 2 weeks PTA in the setting of ARB use and diarrhea and hypotension.  Essential hypertension BP normal off meds - Hold losartan  Emphysema lung (HCC) - Continue ICS/LABA  Hypocalcemia Supplemented, now corrects with albumin  Severe hypoalbuminemia -Suspect chronic liver disease from EtOH abuse  DVT prophylaxis: SCDs Code Status: Full code Family Communication: None present Disposition Plan: To be determined  Consultants:    Procedures:   Antimicrobials:    Objective: Vitals:   04/17/23 0402 04/17/23 0500 04/17/23 0830 04/17/23 0843  BP: 92/70  112/78   Pulse: 86  83 82  Resp: 16  17 18   Temp: 98.5 F (36.9 C)  98.3 F (36.8 C)   TempSrc: Oral  Oral   SpO2: 96%  98% 99%  Weight:  100.2 kg    Height:        Intake/Output Summary (Last 24 hours) at 04/17/2023 1446 Last data filed at 04/17/2023 0939 Gross per 24 hour  Intake 1631.9 ml  Output 875 ml  Net 756.9 ml    Filed Weights   04/15/23 0413 04/16/23 0500 04/17/23 0500  Weight: 97.8 kg 99.5 kg 100.2 kg    Examination:  General exam: Obese chronically ill male sitting up in bed, AAOx3 HEENT: No JVD CVS: S1-S2, regular  rhythm Lungs: Clear bilaterally Abdomen: Firm, mildly distended, mild epigastric tenderness, bowel sounds present   extremities: No edema Skin: No rashes Psychiatry:  Mood & affect appropriate.      Data Reviewed:   CBC: Recent Labs  Lab 04/14/23 2055 04/15/23 0224 04/15/23 1151 04/16/23 0238 04/17/23 0629  WBC 7.8 7.3 7.3 8.5 8.4  HGB 7.8* 6.6* 7.4* 7.4* 8.0*  HCT 23.6* 20.0* 22.4* 22.7* 24.2*  MCV 94.0 91.3 89.2 91.2 93.1  PLT 368 337 340 318 320   Basic Metabolic Panel: Recent Labs  Lab 04/13/23 0227 04/14/23 1250 04/15/23 0224 04/16/23 0238 04/17/23 0629  NA 132* 135 134* 132* 135  K 4.2 3.9 3.1* 3.6 3.4*  CL 98 102 99 103 107  CO2 25 26 23 24  21*  GLUCOSE 110* 106* 121* 88 95  BUN 9 9 8  5* 5*  CREATININE 0.96 0.92 0.86 0.73 0.72  CALCIUM 8.0* 8.1* 7.6* 7.4* 7.5*   GFR: Estimated Creatinine Clearance: 116.1 mL/min (by C-G formula based on SCr of 0.72 mg/dL). Liver Function Tests: Recent Labs  Lab 04/11/23 0314 04/14/23 1250 04/16/23 0238 04/17/23 0629  AST 29 26 22 22   ALT 20 19 17 18   ALKPHOS 126 117 96 93  BILITOT 0.9 0.7 0.7 0.7  PROT 4.7* 5.4* 4.3* 4.3*  ALBUMIN 1.7* 2.0* 1.8* 1.8*   Recent Labs  Lab 04/14/23 1250 04/17/23 0629  LIPASE 118* 82*    No results for input(s): "AMMONIA" in the last 168 hours. Coagulation Profile: No results for input(s): "INR", "PROTIME" in the last 168 hours. Cardiac Enzymes: No results for input(s): "CKTOTAL", "CKMB", "CKMBINDEX", "TROPONINI" in the last 168 hours. BNP (last 3 results) No results for input(s): "PROBNP" in the last 8760 hours. HbA1C: No results for input(s): "HGBA1C" in the last 72 hours. CBG: Recent Labs  Lab 04/10/23 2111  GLUCAP 129*   Lipid Profile: No results for input(s): "CHOL", "HDL", "LDLCALC", "TRIG", "CHOLHDL", "LDLDIRECT" in the last 72 hours. Thyroid Function Tests: No results for input(s): "TSH", "T4TOTAL", "FREET4", "T3FREE", "THYROIDAB" in the last 72 hours. Anemia Panel: No results for input(s): "VITAMINB12", "FOLATE", "FERRITIN", "TIBC", "IRON", "RETICCTPCT" in the last 72 hours.  Urine analysis:    Component Value Date/Time   COLORURINE YELLOW  04/07/2023 0512   APPEARANCEUR CLEAR 04/07/2023 0512   LABSPEC 1.034 (H) 04/07/2023 0512   PHURINE 5.0 04/07/2023 0512   GLUCOSEU NEGATIVE 04/07/2023 0512   HGBUR SMALL (A) 04/07/2023 0512   BILIRUBINUR NEGATIVE 04/07/2023 0512   KETONESUR NEGATIVE 04/07/2023 0512   PROTEINUR NEGATIVE 04/07/2023 0512   NITRITE NEGATIVE 04/07/2023 0512   LEUKOCYTESUR NEGATIVE 04/07/2023 0512   Sepsis Labs: @LABRCNTIP (procalcitonin:4,lacticidven:4)  )No results found for this or any previous visit (from the past 240 hour(s)).   Radiology Studies: CT Angio Abd/Pel w/ and/or w/o  Result Date: 04/16/2023 CLINICAL DATA:  Lower GI bleeding.  Acute pancreatitis. EXAM: CTA ABDOMEN AND PELVIS WITHOUT AND WITH CONTRAST TECHNIQUE: Multidetector CT imaging of the abdomen and pelvis was performed using the standard protocol during bolus administration of intravenous contrast. Multiplanar reconstructed images and MIPs were obtained and reviewed to evaluate the vascular anatomy. RADIATION DOSE REDUCTION: This exam was performed according to the departmental dose-optimization program which includes automated exposure control, adjustment of the mA and/or kV according to patient size and/or use of iterative reconstruction technique. CONTRAST:  OMNIPAQUE IOHEXOL 350 MG/ML SOLN COMPARISON:  Prior CT scan of the abdomen and pelvis 04/06/2023 FINDINGS: VASCULAR Aorta: Irregular aneurysmal dilation of the infrarenal  abdominal aortic with significant multifocal penetrating atherosclerotic ulcerations. The aorta is tortuous. Maximal aortic diameter is 3.6 cm (see coronal reformatted images). Celiac: Patent without evidence of aneurysm, dissection, vasculitis or significant stenosis. SMA: Patent without evidence of aneurysm, dissection, vasculitis or significant stenosis. Renals: Both renal arteries are patent without evidence of aneurysm, dissection, vasculitis, fibromuscular dysplasia or significant stenosis. Small accessory artery  to the upper pole of the left kidney. IMA: Patent without evidence of aneurysm, dissection, vasculitis or significant stenosis. Inflow: Patent without evidence of aneurysm, dissection, vasculitis or significant stenosis. Proximal Outflow: Bilateral common femoral and visualized portions of the superficial and profunda femoral arteries are patent without evidence of aneurysm, dissection, vasculitis or significant stenosis. Veins: No focal venous abnormality. Review of the MIP images confirms the above findings. NON-VASCULAR Lower chest: No acute abnormality. Mild centrilobular pulmonary emphysema. Hepatobiliary: Low attenuation of the hepatic parenchyma with sparing adjacent to the gallbladder fossa consistent with steatosis. Normal hepatic contour and morphology. No discrete hepatic lesions. Normal appearance of the gallbladder. No intra or extrahepatic biliary ductal dilatation. Pancreas: Progressive acute severe pancreatitis with increased edema and stranding along the pancreatic tail and head compared to the prior imaging. Rounded focus of higher attenuation posterior to the pancreatic head measures 2.1 x 1.9 cm and likely represents a focus of hematoma in the setting of hemorrhagic pancreatitis. No visible pseudoaneurysm or active bleeding. Spleen: Normal in size without focal abnormality. Adrenals/Urinary Tract: Adrenal glands are unremarkable. Kidneys are normal, without renal calculi, focal solid lesion, or hydronephrosis. Bladder is unremarkable. Benign cyst exophytic from the medial lower pole of the right kidney. No imaging follow-up is recommended. Stomach/Bowel: Colonic diverticular disease without CT evidence of active inflammation. No evidence of active lower GI bleeding. The appendix is normal. Secondary reactive thickening is present of the duodenum in the second and third portions secondary to the underlying pancreatitis. Lymphatic: No suspicious lymphadenopathy. Reproductive: Prostate is  unremarkable. Other: No abdominal wall hernia or abnormality. No abdominopelvic ascites. Musculoskeletal: No acute or significant osseous findings. Stable chronic T12 compression fracture. IMPRESSION: VASCULAR 1. No evidence of active GI bleeding at the time of this scan. 2. No evidence of arterial pseudoaneurysm or venous thrombosis as a complication from acute pancreatitis. 3. Irregular aneurysmal dilation of the infrarenal abdominal aorta with highly irregular/ulcerated atherosclerotic plaque and multifocal penetrating atherosclerotic ulcerations. Maximal aortic diameter is 3.6 cm. Recommend follow-up ultrasound every 3 years. (Ref.: J Vasc Surg. 2018; 67:2-77 and J Am Coll Radiol 2013;10(10):789-794.) NON-VASCULAR 1. Progressive acute pancreatitis with increased inflammatory changes about the pancreatic head and new involvement of the pancreatic tail. Additionally, findings suggest hemorrhagic pancreatitis with development of a rounded 2.1 x 1.9 cm high attenuation focus posterior and inferior to the pancreatic head likely representing a region of hematoma. A smaller focus of probable bleeding was present on the prior study from 04/06/2023. Importantly, no definite pseudoaneurysm or active bleeding was visible on the arterial phase images. The close proximity of this focus of hemorrhagic pancreatitis to the immediately adjacent duodenum could result in some manifestation of GI bleeding. 2. Additional ancillary findings as above including mild centrilobular pulmonary emphysema, hepatic steatosis, and extensive colonic diverticulosis. Aortic aneurysm NOS (ICD10-I71.9); Aortic Atherosclerosis (ICD10-I70.0) and Emphysema (ICD10-J43.9). Signed, Sterling Big, MD, RPVI [facility] Electronically Signed   By: Malachy Moan M.D.   On: 04/16/2023 08:00     Scheduled Meds:  mometasone-formoterol  2 puff Inhalation BID   multivitamin with minerals  Oral Daily   pantoprazole (PROTONIX) IV  40 mg Intravenous Q12H   Continuous Infusions:  sodium chloride 75 mL/hr at 04/17/23 1414     LOS: 11 days    Time spent:    Zannie Cove, MD [facility]   04/17/2023, 2:46 PM

## 2023-04-17 NOTE — Plan of Care (Signed)
  Problem: Clinical Measurements: Goal: Diagnostic test results will improve Outcome: Not Progressing   Problem: Nutrition: Goal: Adequate nutrition will be maintained Outcome: Not Progressing   Problem: Elimination: Goal: Will not experience complications related to bowel motility Outcome: Not Progressing   Problem: Pain Managment: Goal: General experience of comfort will improve Outcome: Not Progressing

## 2023-04-17 NOTE — Progress Notes (Signed)
Progress Note  Primary GI: Dr. Lavon Paganini DOA: 04/06/2023         Hospital Day: 12   Subjective  Chief Complaint: Hematochezia and ? Pancreatic pseudoaneurysm   No family was present at the time of my evaluation. Last bowel movement was Friday loose, black with blood. Continues to have abdominal discomfort worse with cold liquids.  He is passing gas and has had an increased amount of gas.  Denies nausea or vomiting.    Objective   Vital signs in last 24 hours: Temp:  [98.2 F (36.8 C)-98.5 F (36.9 C)] 98.3 F (36.8 C) (09/30 0830) Pulse Rate:  [73-92] 82 (09/30 0843) Resp:  [16-18] 18 (09/30 0843) BP: (92-112)/(70-82) 112/78 (09/30 0830) SpO2:  [96 %-100 %] 99 % (09/30 0843) Weight:  [100.2 kg] 100.2 kg (09/30 0500) Last BM Date : 04/14/23 Last BM recorded by nurses in past 5 days Stool Type: Type 6 (Mushy consistency with ragged edges) (04/14/2023  3:56 PM)  General:  obese, chronically ill male in no acute distress  Heart:  Regular rate and rhythm; no murmurs Pulm: Clear anteriorly; no wheezing Abdomen:   firm , Obese AB, Sluggish bowel sounds. mild tenderness in the epigastrium. , No organomegaly appreciated. Extremities:  with 1-2+ edema. Neurologic:  Alert and  oriented x4;  No focal deficits.  Psych:  Cooperative. Normal mood and affect.  Intake/Output from previous day: 09/29 0701 - 09/30 0700 In: 1271.9 [I.V.:1271.9] Out: 775 [Urine:775] Intake/Output this shift: Total I/O In: 360 [P.O.:360] Out: 100 [Urine:100]  Studies/Results: CT Angio Abd/Pel w/ and/or w/o  Result Date: 04/16/2023 CLINICAL DATA:  Lower GI bleeding.  Acute pancreatitis. EXAM: CTA ABDOMEN AND PELVIS WITHOUT AND WITH CONTRAST TECHNIQUE: Multidetector CT imaging of the abdomen and pelvis was performed using the standard protocol during bolus administration of intravenous contrast. Multiplanar reconstructed images and MIPs were obtained and reviewed to evaluate the vascular anatomy. RADIATION  DOSE REDUCTION: This exam was performed according to the departmental dose-optimization program which includes automated exposure control, adjustment of the mA and/or kV according to patient size and/or use of iterative reconstruction technique. CONTRAST:  OMNIPAQUE IOHEXOL 350 MG/ML SOLN COMPARISON:  Prior CT scan of the abdomen and pelvis 04/06/2023 FINDINGS: VASCULAR Aorta: Irregular aneurysmal dilation of the infrarenal abdominal aortic with significant multifocal penetrating atherosclerotic ulcerations. The aorta is tortuous. Maximal aortic diameter is 3.6 cm (see coronal reformatted images). Celiac: Patent without evidence of aneurysm, dissection, vasculitis or significant stenosis. SMA: Patent without evidence of aneurysm, dissection, vasculitis or significant stenosis. Renals: Both renal arteries are patent without evidence of aneurysm, dissection, vasculitis, fibromuscular dysplasia or significant stenosis. Small accessory artery to the upper pole of the left kidney. IMA: Patent without evidence of aneurysm, dissection, vasculitis or significant stenosis. Inflow: Patent without evidence of aneurysm, dissection, vasculitis or significant stenosis. Proximal Outflow: Bilateral common femoral and visualized portions of the superficial and profunda femoral arteries are patent without evidence of aneurysm, dissection, vasculitis or significant stenosis. Veins: No focal venous abnormality. Review of the MIP images confirms the above findings. NON-VASCULAR Lower chest: No acute abnormality. Mild centrilobular pulmonary emphysema. Hepatobiliary: Low attenuation of the hepatic parenchyma with sparing adjacent to the gallbladder fossa consistent with steatosis. Normal hepatic contour and morphology. No discrete hepatic lesions. Normal appearance of the gallbladder. No intra or extrahepatic biliary ductal dilatation. Pancreas: Progressive acute severe pancreatitis with increased edema and stranding along the  pancreatic tail and head compared to the prior imaging. Rounded focus of  higher attenuation posterior to the pancreatic head measures 2.1 x 1.9 cm and likely represents a focus of hematoma in the setting of hemorrhagic pancreatitis. No visible pseudoaneurysm or active bleeding. Spleen: Normal in size without focal abnormality. Adrenals/Urinary Tract: Adrenal glands are unremarkable. Kidneys are normal, without renal calculi, focal solid lesion, or hydronephrosis. Bladder is unremarkable. Benign cyst exophytic from the medial lower pole of the right kidney. No imaging follow-up is recommended. Stomach/Bowel: Colonic diverticular disease without CT evidence of active inflammation. No evidence of active lower GI bleeding. The appendix is normal. Secondary reactive thickening is present of the duodenum in the second and third portions secondary to the underlying pancreatitis. Lymphatic: No suspicious lymphadenopathy. Reproductive: Prostate is unremarkable. Other: No abdominal wall hernia or abnormality. No abdominopelvic ascites. Musculoskeletal: No acute or significant osseous findings. Stable chronic T12 compression fracture. IMPRESSION: VASCULAR 1. No evidence of active GI bleeding at the time of this scan. 2. No evidence of arterial pseudoaneurysm or venous thrombosis as a complication from acute pancreatitis. 3. Irregular aneurysmal dilation of the infrarenal abdominal aorta with highly irregular/ulcerated atherosclerotic plaque and multifocal penetrating atherosclerotic ulcerations. Maximal aortic diameter is 3.6 cm. Recommend follow-up ultrasound every 3 years. (Ref.: J Vasc Surg. 2018; 67:2-77 and J Am Coll Radiol 2013;10(10):789-794.) NON-VASCULAR 1. Progressive acute pancreatitis with increased inflammatory changes about the pancreatic head and new involvement of the pancreatic tail. Additionally, findings suggest hemorrhagic pancreatitis with development of a rounded 2.1 x 1.9 cm high attenuation focus  posterior and inferior to the pancreatic head likely representing a region of hematoma. A smaller focus of probable bleeding was present on the prior study from 04/06/2023. Importantly, no definite pseudoaneurysm or active bleeding was visible on the arterial phase images. The close proximity of this focus of hemorrhagic pancreatitis to the immediately adjacent duodenum could result in some manifestation of GI bleeding. 2. Additional ancillary findings as above including mild centrilobular pulmonary emphysema, hepatic steatosis, and extensive colonic diverticulosis. Aortic aneurysm NOS (ICD10-I71.9); Aortic Atherosclerosis (ICD10-I70.0) and Emphysema (ICD10-J43.9). Signed, Sterling Big, MD, RPVI [facility] Radiology Electronically Signed   By: Malachy Moan M.D.   On: 04/16/2023 08:00    Lab Results: Recent Labs    04/15/23 1151 04/16/23 0238 04/17/23 0629  WBC 7.3 8.5 8.4  HGB 7.4* 7.4* 8.0*  HCT 22.4* 22.7* 24.2*  PLT 340 318 320   BMET Recent Labs    04/15/23 0224 04/16/23 0238 04/17/23 0629  NA 134* 132* 135  K 3.1* 3.6 3.4*  CL 99 103 107  CO2 23 24 21*  GLUCOSE 121* 88 95  BUN 8 5* 5*  CREATININE 0.86 0.73 0.72  CALCIUM 7.6* 7.4* 7.5*   LFT Recent Labs    04/17/23 0629  PROT 4.3*  ALBUMIN 1.8*  AST 22  ALT 18  ALKPHOS 93  BILITOT 0.7   PT/INR No results for input(s): "LABPROT", "INR" in the last 72 hours.   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 Worsneing acute pancreatitis, development of rounded 2.1 x 1.9 cm attenuation focus pancreatic head, likely hematoma. The close proximity of this focus of hemorrhagic pancreatitis to the immediately adjacent duodenum could result in some manifestation of GI bleeding.  EGD 11/2022 normal doudenum  -Smoking and alcohol cessation counseling. -Continue supportive care with pain control, fluid replacement with LR, early ambulation.  -Advance diet as tolerated.  -Trend CBC, CMP -VTE prophylaxis. -Will follow along.  Hematochezia with anemia iron and B12 def June 2024 Colon Dr. Rhea Belton for hematochezia, small adenomas polyps, diverticula left side HGB 6.6 s/p 1 PRBC 09/28 currently stable 7.4-->8.0 04/13/2023 Iron 12 Ferritin 433 B12 211 CTA 09/30 no GI bleeding EGD 11/2022 normal duodenum -Continue pantoprazole 40 mg IV twice daily -Can consider EGD for hemorrhagic focus around pancreas adjacent to duodenum, but I do believe more likely this is from pancreas, will discuss further with Dr. Rhea Belton  Anemia Iron and B12 def HGB 6.6 s/p 1 PRBC now stable at 8 Suggest B12 replacement and iron replacement IV versus oral per primary  ETOH abuse -Empiric treatment with folic acid, multivitamin and thiamine.  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

## 2023-04-17 NOTE — Plan of Care (Signed)
  Problem: Education: Goal: Knowledge of General Education information will improve Description Including pain rating scale, medication(s)/side effects and non-pharmacologic comfort measures Outcome: Progressing   Problem: Clinical Measurements: Goal: Cardiovascular complication will be avoided Outcome: Progressing   Problem: Activity: Goal: Risk for activity intolerance will decrease Outcome: Progressing   Problem: Nutrition: Goal: Adequate nutrition will be maintained Outcome: Progressing   Problem: Elimination: Goal: Will not experience complications related to bowel motility Outcome: Progressing Goal: Will not experience complications related to urinary retention Outcome: Progressing   Problem: Pain Managment: Goal: General experience of comfort will improve Outcome: Progressing   Problem: Safety: Goal: Ability to remain free from injury will improve Outcome: Progressing   Problem: Skin Integrity: Goal: Risk for impaired skin integrity will decrease Outcome: Progressing

## 2023-04-18 ENCOUNTER — Encounter (HOSPITAL_COMMUNITY): Payer: Self-pay | Admitting: Internal Medicine

## 2023-04-18 DIAGNOSIS — K859 Acute pancreatitis without necrosis or infection, unspecified: Secondary | ICD-10-CM | POA: Diagnosis not present

## 2023-04-18 LAB — COMPREHENSIVE METABOLIC PANEL
ALT: 19 U/L (ref 0–44)
AST: 23 U/L (ref 15–41)
Albumin: 1.8 g/dL — ABNORMAL LOW (ref 3.5–5.0)
Alkaline Phosphatase: 97 U/L (ref 38–126)
Anion gap: 8 (ref 5–15)
BUN: 6 mg/dL — ABNORMAL LOW (ref 8–23)
CO2: 22 mmol/L (ref 22–32)
Calcium: 7.4 mg/dL — ABNORMAL LOW (ref 8.9–10.3)
Chloride: 107 mmol/L (ref 98–111)
Creatinine, Ser: 0.82 mg/dL (ref 0.61–1.24)
GFR, Estimated: >60 mL/min (ref >60)
Glucose, Bld: 113 mg/dL — ABNORMAL HIGH (ref 70–99)
Potassium: 3.2 mmol/L — ABNORMAL LOW (ref 3.5–5.1)
Sodium: 137 mmol/L (ref 135–145)
Total Bilirubin: 0.4 mg/dL (ref 0.3–1.2)
Total Protein: 4.5 g/dL — ABNORMAL LOW (ref 6.5–8.1)

## 2023-04-18 LAB — CBC
HCT: 23.6 % — ABNORMAL LOW (ref 39.0–52.0)
Hemoglobin: 7.7 g/dL — ABNORMAL LOW (ref 13.0–17.0)
MCH: 30.3 pg (ref 26.0–34.0)
MCHC: 32.6 g/dL (ref 30.0–36.0)
MCV: 92.9 fL (ref 80.0–100.0)
Platelets: 323 10*3/uL (ref 150–400)
RBC: 2.54 MIL/uL — ABNORMAL LOW (ref 4.22–5.81)
RDW: 14.3 % (ref 11.5–15.5)
WBC: 8.2 10*3/uL (ref 4.0–10.5)
nRBC: 0 % (ref 0.0–0.2)

## 2023-04-18 MED ORDER — POTASSIUM CHLORIDE CRYS ER 20 MEQ PO TBCR
40.0000 meq | EXTENDED_RELEASE_TABLET | ORAL | Status: AC
  Administered 2023-04-18 (×2): 40 meq via ORAL
  Filled 2023-04-18 (×2): qty 2

## 2023-04-18 NOTE — Progress Notes (Signed)
PROGRESS NOTE    Luis Marquez  WUJ:[id number] DOB: Sep 14, 1960 DOA: 04/06/2023 PCP: Claiborne Rigg, NP  Luis Marquez is a 62 y.o. M with HFrEF EF 20-25%, pAF on Eliquis, COPD, alcohol use and hx DTs, and CAD who presented with epigastric pain. History of intermittent hematochezia for months, had a colonoscopy 5/24, multiple polyps removed In the ER, CT showed pancreatitis, possible pseudoaneurysm,  abdominal aortic aneurysm now measuring up to 3.7 cm. There is also increasing changes of a penetrating enhancing ulcer along the mural plaque and thrombus along the lower abdominal Aorta.  Lipase >1000.  -Seen by IR, recommended supportive care on admission -Pancreatitis slowly improving, diet advanced -9/27 with multiple episodes of hematochezia -9/28, hemoglobin down to 6.6, transfusing, obtain CTA, GI consulted  Subjective: -No hematochezia yesterday, continues to have some abdominal discomfort  Assessment and Plan:   Acute pancreatitis Pseudoaneurysm -Prior heavy alcoholism, quit few weeks ago, ultrasound negative for cholelithiasis -Initial CT with concern for pseudoaneurysm, not seen on repeat imaging 9/27 -Continue IV fluids, pain control, full liquids, PPI -Appreciate GI input  Anemia Hematochezia Baseline normal 3 months ago ~13 g/dL.   history of ongoing hematochezia intermittently for months, last colonoscopy 6/24 multiple polyps were removed, diverticulosis -Eliquis resumed by my partner 4 days ago, held 9/27 with recurrent hematochezia -3-4 episodes of hematochezia 9/27, none since then, transfused PRBC 9/28, hemoglobin stabilizing, continue to trend, Eliquis on hold -Per GI possible capsule endoscopy as outpatient  AAA with ulcer and mural plaque, thrombus - Discussed with vascular surgery Dr. Myra Gianotti, imaging reviewed, recommended blood pressure control, outpatient follow-up in few months -BP stable  Hyponatremia Mild,  asymptomatic  Hypomagnesemia Supplemented  Paroxysmal atrial fibrillation (HCC) HR controlled - Holding Eliquis - Metoprolol on hold  Chronic systolic CHF (congestive heart failure) (HCC) -Repeat echo with normalization of EF, remains euvolemic -High risk of third spacing with low albumin, monitor volume status closely while on fluids  AKI (acute kidney injury) (HCC) Had AKI with Cr up to 3 mg/dL 2 weeks PTA in the setting of ARB use and diarrhea and hypotension.  Essential hypertension BP normal off meds - Hold losartan  Emphysema lung (HCC) - Continue ICS/LABA  Hypocalcemia Supplemented, now corrects with albumin  Severe hypoalbuminemia -Suspect chronic liver disease from EtOH abuse  DVT prophylaxis: SCDs Code Status: Full code Family Communication: None present Disposition Plan: To be determined  Consultants:    Procedures:   Antimicrobials:    Objective: Vitals:   04/17/23 2053 04/18/23 0500 04/18/23 0534 04/18/23 0742  BP:   120/80 111/74  Pulse:   87 83  Resp:   18 17  Temp:   98.3 F (36.8 C) 98.5 F (36.9 C)  TempSrc:   Oral Oral  SpO2: 100%  99% 100%  Weight:  99.6 kg    Height:        Intake/Output Summary (Last 24 hours) at 04/18/2023 1239 Last data filed at 04/18/2023 1019 Gross per 24 hour  Intake 2190.26 ml  Output 550 ml  Net 1640.26 ml    Filed Weights   04/16/23 0500 04/17/23 0500 04/18/23 0500  Weight: 99.5 kg 100.2 kg 99.6 kg    Examination:  General exam: Obese chronically ill male sitting up in bed, AAOx3 HEENT: No JVD CVS: S1-S2, regular rhythm Lungs: Clear bilaterally Abdomen: Firm, mildly distended, mild epigastric tenderness, bowel sounds present   extremities: No edema Skin: No rashes Psychiatry:  Mood & affect appropriate.     Data Reviewed:  CBC: Recent Labs  Lab 04/15/23 0224 04/15/23 1151 04/16/23 0238 04/17/23 0629 04/18/23 0305  WBC 7.3 7.3 8.5 8.4 8.2  HGB 6.6* 7.4* 7.4* 8.0* 7.7*  HCT  20.0* 22.4* 22.7* 24.2* 23.6*  MCV 91.3 89.2 91.2 93.1 92.9  PLT 337 340 318 320 323   Basic Metabolic Panel: Recent Labs  Lab 04/14/23 1250 04/15/23 0224 04/16/23 0238 04/17/23 0629 04/18/23 0305  NA 135 134* 132* 135 137  K 3.9 3.1* 3.6 3.4* 3.2*  CL 102 99 103 107 107  CO2 26 23 24  21* 22  GLUCOSE 106* 121* 88 95 113*  BUN 9 8 5* 5* 6*  CREATININE 0.92 0.86 0.73 0.72 0.82  CALCIUM 8.1* 7.6* 7.4* 7.5* 7.4*   GFR: Estimated Creatinine Clearance: 113 mL/min (by C-G formula based on SCr of 0.82 mg/dL). Liver Function Tests: Recent Labs  Lab 04/14/23 1250 04/16/23 0238 04/17/23 0629 04/18/23 0305  AST 26 22 22 23   ALT 19 17 18 19   ALKPHOS 117 96 93 97  BILITOT 0.7 0.7 0.7 0.4  PROT 5.4* 4.3* 4.3* 4.5*  ALBUMIN 2.0* 1.8* 1.8* 1.8*   Recent Labs  Lab 04/14/23 1250 04/17/23 0629  LIPASE 118* 82*    No results for input(s): "AMMONIA" in the last 168 hours. Coagulation Profile: No results for input(s): "INR", "PROTIME" in the last 168 hours. Cardiac Enzymes: No results for input(s): "CKTOTAL", "CKMB", "CKMBINDEX", "TROPONINI" in the last 168 hours. BNP (last 3 results) No results for input(s): "PROBNP" in the last 8760 hours. HbA1C: No results for input(s): "HGBA1C" in the last 72 hours. CBG: No results for input(s): "GLUCAP" in the last 168 hours.  Lipid Profile: No results for input(s): "CHOL", "HDL", "LDLCALC", "TRIG", "CHOLHDL", "LDLDIRECT" in the last 72 hours. Thyroid Function Tests: No results for input(s): "TSH", "T4TOTAL", "FREET4", "T3FREE", "THYROIDAB" in the last 72 hours. Anemia Panel: No results for input(s): "VITAMINB12", "FOLATE", "FERRITIN", "TIBC", "IRON", "RETICCTPCT" in the last 72 hours.  Urine analysis:    Component Value Date/Time   COLORURINE YELLOW 04/07/2023 0512   APPEARANCEUR CLEAR 04/07/2023 0512   LABSPEC 1.034 (H) 04/07/2023 0512   PHURINE 5.0 04/07/2023 0512   GLUCOSEU NEGATIVE 04/07/2023 0512   HGBUR SMALL (A) 04/07/2023  0512   BILIRUBINUR NEGATIVE 04/07/2023 0512   KETONESUR NEGATIVE 04/07/2023 0512   PROTEINUR NEGATIVE 04/07/2023 0512   NITRITE NEGATIVE 04/07/2023 0512   LEUKOCYTESUR NEGATIVE 04/07/2023 0512   Sepsis Labs: @LABRCNTIP (procalcitonin:4,lacticidven:4)  )No results found for this or any previous visit (from the past 240 hour(s)).   Radiology Studies: No results found.   Scheduled Meds:  mometasone-formoterol  2 puff Inhalation BID   multivitamin with minerals   Oral Daily   pantoprazole (PROTONIX) IV  40 mg Intravenous Q12H   Continuous Infusions:  sodium chloride 75 mL/hr at 04/18/23 0156     LOS: 12 days    Time spent:    Zannie Cove, MD [facility]   04/18/2023, 12:39 PM

## 2023-04-18 NOTE — TOC Initial Note (Signed)
Transition of Care (TOC) - Initial/Assessment Note   Spoke to patient at bedside   PCP Is Meredeth Ide   Address is his mailing address. Patient is staying at [facility] in [facility] and plans to return there at discharge. He has no family close but has a couple of friends. At discharge a friend can provide transportation back to [facility]   Patient has insurance.   TOC will continue to follow for discharge needs  Patient Details  Name: Luis Marquez MRN: [id number] Date of Birth: 1960/12/15  Transition of Care [facility]) CM/SW Contact:    Kingsley Plan, RN Phone Number: 04/18/2023, 12:24 PM  Clinical Narrative:                   Expected Discharge Plan: Home/Self Care Barriers to Discharge: Continued Medical Work up   Patient Goals and CMS Choice Patient states their goals for this hospitalization and ongoing recovery are:: to return to [facility]   Choice offered to / list presented to : NA      Expected Discharge Plan and Services   Discharge Planning Services: CM Consult Post Acute Care Choice: NA Living arrangements for the past 2 months: Hotel/Motel                 DME Arranged: N/A DME Agency: NA       HH Arranged: NA          Prior Living Arrangements/Services Living arrangements for the past 2 months: Hotel/Motel Lives with:: Self Patient language and need for interpreter reviewed:: Yes Do you feel safe going back to the place where you live?: Yes      Need for Family Participation in Patient Care: No (Comment) Care giver support system in place?:  (has friends)   Criminal Activity/Legal Involvement Pertinent to Current Situation/Hospitalization: No - Comment as needed  Activities of Daily Living      Permission Sought/Granted   Permission granted to share information with : No              Emotional Assessment Appearance:: Appears stated age Attitude/Demeanor/Rapport: Engaged Affect (typically observed):  Accepting Orientation: : Oriented to Self, Oriented to Place, Oriented to  Time, Oriented to Situation Alcohol / Substance Use: Not Applicable Psych Involvement: No (comment)  Admission diagnosis:  Acute pancreatitis [K85.90] Acute pancreatitis, unspecified complication status, unspecified pancreatitis type [K85.90] Patient Active Problem List   Diagnosis Date Noted   Hematochezia 04/17/2023   Aortic atherosclerosis (HCC) 04/09/2023   Hypomagnesemia 04/09/2023   Hyponatremia 04/09/2023   Acute pancreatitis 04/06/2023   Gastroesophageal reflux disease without esophagitis 11/24/2022   Irregular Z line of esophagus 11/24/2022   Benign neoplasm of ascending colon 11/24/2022   Benign neoplasm of descending colon 11/24/2022   Benign neoplasm of sigmoid colon 11/24/2022   Benign neoplasm of rectum 11/24/2022   Diverticulosis of intestine with bleeding 11/22/2022   GI bleed 11/22/2022   Colon cancer screening 09/20/2022   Esophagitis 09/20/2022   Gastroesophageal reflux disease with esophagitis 09/20/2022   Chronic anticoagulation 09/20/2022   Osteoarthritis of right hip 08/19/2022   Tobacco use 12/20/2021   Esophageal dysphagia    Acute esophagitis    Alcohol abuse 12/08/2021   Paroxysmal atrial fibrillation (HCC) 12/08/2021   Chronic systolic CHF (congestive heart failure) (HCC) 11/22/2021   Acute HFrEF (heart failure with reduced ejection fraction) (HCC) 11/04/2021   Hypokalemia 11/03/2021   Solitary pulmonary nodule 11/03/2021   Emphysema lung (HCC) 11/03/2021  Essential hypertension 11/03/2021   AKI (acute kidney injury) (HCC)    Hypocalcemia 11/02/2021   Anemia 11/11/2014   PCP:  Claiborne Rigg, NP Pharmacy:   [facility] [address] [place] [address] Phone: [phone] Fax: [phone]     Social Determinants of Health (SDOH) Social History: SDOH Screenings   Food Insecurity: No Food  Insecurity (11/22/2022)  Housing: Low Risk  (04/18/2023)  Transportation Needs: No Transportation Needs (11/22/2022)  Utilities: Not At Risk (11/22/2022)  Depression (PHQ2-9): Low Risk  (04/18/2023)  Tobacco Use: High Risk (04/06/2023)   SDOH Interventions:     Readmission Risk Interventions    11/23/2022   12:48 PM  Readmission Risk Prevention Plan  Post Dischage Appt Complete  Medication Screening Complete  Transportation Screening Complete

## 2023-04-18 NOTE — TOC CAGE-AID Note (Signed)
Transition of Care [facility]) - CAGE-AID Screening   Patient Details  Name: Tyquez Hollibaugh MRN: [id number] Date of Birth: July 15, 1961  Transition [facility]) CM/SW Contact:    Kingsley Plan, RN Phone Number: 04/18/2023, 12:20 PM   Clinical Narrative:    CAGE-AID Screening:    Have You Ever Felt You Ought to Cut Down on Your Drinking or Drug Use?: Yes Have People Annoyed You By Critizing Your Drinking Or Drug Use?: No Have You Felt Bad Or Guilty About Your Drinking Or Drug Use?: No Have You Ever Had a Drink or Used Drugs First Thing In The Morning to Steady Your Nerves or to Get Rid of a Hangover?: No CAGE-AID Score: 1  Substance Abuse Education Offered: Yes  Substance abuse interventions: Patient Counseling

## 2023-04-18 NOTE — Progress Notes (Signed)
Progress Note  Primary GI: Dr. Lavon Paganini DOA: 04/06/2023         Hospital Day: 13   Subjective  Chief Complaint: Hematochezia and ? Pancreatic pseudoaneurysm   No family was present at the time of my evaluation. Last bowel movement was Friday loose, black with blood.  Tolerating full liquid diet but having some mild increased abdominal discomfort, epigastric.  He is passing gas and has had an increased amount of gas. Denies nausea or vomiting, fevers or chills.    Objective   Vital signs in last 24 hours: Temp:  [97.8 F (36.6 C)-98.5 F (36.9 C)] 98.5 F (36.9 C) (10/01 0742) Pulse Rate:  [83-105] 83 (10/01 0742) Resp:  [17-18] 17 (10/01 0742) BP: (104-120)/(74-80) 111/74 (10/01 0742) SpO2:  [98 %-100 %] 100 % (10/01 0742) Weight:  [99.6 kg] 99.6 kg (10/01 0500) Last BM Date : 04/14/23 (per pt) Last BM recorded by nurses in past 5 days Stool Type: Type 6 (Mushy consistency with ragged edges) (04/14/2023  3:56 PM)  General:  obese, chronically ill male in no acute distress  Heart:  Regular rate and rhythm; no murmurs Pulm: Clear anteriorly; no wheezing Abdomen:   firm , Obese AB, Sluggish bowel sounds. mild tenderness in the epigastrium. , No organomegaly appreciated. Extremities:  with 1+ edema. Neurologic:  Alert and  oriented x4;  No focal deficits.  Psych:  Cooperative. Normal mood and affect.  Intake/Output from previous day: 09/30 0701 - 10/01 0700 In: 2190.3 [P.O.:940; I.V.:1250.3] Out: 300 [Urine:300] Intake/Output this shift: Total I/O In: 360 [P.O.:360] Out: 350 [Urine:350]  Studies/Results: No results found.  Lab Results: Recent Labs    04/16/23 0238 04/17/23 0629 04/18/23 0305  WBC 8.5 8.4 8.2  HGB 7.4* 8.0* 7.7*  HCT 22.7* 24.2* 23.6*  PLT 318 320 323   BMET Recent Labs    04/16/23 0238 04/17/23 0629 04/18/23 0305  NA 132* 135 137  K 3.6 3.4* 3.2*  CL 103 107 107  CO2 24 21* 22  GLUCOSE 88 95 113*  BUN 5* 5* 6*  CREATININE 0.73  0.72 0.82  CALCIUM 7.4* 7.5* 7.4*   LFT Recent Labs    04/18/23 0305  PROT 4.5*  ALBUMIN 1.8*  AST 23  ALT 19  ALKPHOS 97  BILITOT 0.4   PT/INR No results for input(s): "LABPROT", "INR" in the last 72 hours.   Scheduled Meds:  mometasone-formoterol  2 puff Inhalation BID   multivitamin with minerals   Oral Daily   pantoprazole (PROTONIX) IV  40 mg Intravenous Q12H   Continuous Infusions:  sodium chloride 75 mL/hr at 04/18/23 0156      Impression/Plan:   Hemorrhagic acute pancreatitis secondary to ETOH pancreatitis No pseudoaneurysm seen on recent imaging 04/18/2023 WBC 8.2  04/18/2023 BUN 6 Cr 0.82 04/18/2023 AST 23 ALT 19 Alkphos 97 TBili 0.4  04/17/2023 LIPASE 82 WBC 8.4 04/17/2023 BUN 5 Cr 0.72  04/17/2023 AST 22 ALT 18 Alkphos 93 TBili 0.7 CTA positive on admission, IR and vascular consulted, HD stable and no active bleeding 09/27 with hemotochezia and HGB 6.6 09/30 CTA for hematochezia showed no active GI bleeding, no evidence of arterial pseudoaneurysm or venous thrombosis as a complication from acute pancreatitis. Worsneing acute pancreatitis, development of rounded 2.1 x 1.9 cm attenuation focus pancreatic head, likely hematoma. The close proximity of this focus of hemorrhagic pancreatitis to the immediately adjacent duodenum could result in some manifestation of GI bleeding.  EGD 11/2022 normal doudenum  -Smoking  and alcohol cessation counseling. -Continue supportive care with pain control, fluid replacement with LR, early ambulation.  -Advance diet as tolerated.  -Trend CBC, CMP -VTE prophylaxis. -Will follow along.  Hematochezia with anemia iron and B12 def June 2024 Colon Dr. Rhea Belton for hematochezia, small adenomas polyps, diverticula left side CTA 09/30 no GI bleeding EGD 11/2022 normal duodenum HGB 6.6 s/p 1 PRBC 09/28 currently stable 7.4-->8.0-->7.7 Stable H/H, no GI bleeding at this time -Continue pantoprazole 40 mg IV twice daily - monitor H/H,  replace if below 7 Suggest B12 replacement and iron replacement IV versus oral per primary - consider VCE outpatient  ETOH abuse -Empiric treatment with folic acid, multivitamin and thiamine.  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

## 2023-04-19 DIAGNOSIS — K859 Acute pancreatitis without necrosis or infection, unspecified: Secondary | ICD-10-CM | POA: Diagnosis not present

## 2023-04-19 DIAGNOSIS — D649 Anemia, unspecified: Secondary | ICD-10-CM

## 2023-04-19 LAB — BASIC METABOLIC PANEL
Anion gap: 7 (ref 5–15)
BUN: 5 mg/dL — ABNORMAL LOW (ref 8–23)
CO2: 20 mmol/L — ABNORMAL LOW (ref 22–32)
Calcium: 7.3 mg/dL — ABNORMAL LOW (ref 8.9–10.3)
Chloride: 108 mmol/L (ref 98–111)
Creatinine, Ser: 0.7 mg/dL (ref 0.61–1.24)
GFR, Estimated: >60 mL/min (ref >60)
Glucose, Bld: 105 mg/dL — ABNORMAL HIGH (ref 70–99)
Potassium: 3.9 mmol/L (ref 3.5–5.1)
Sodium: 135 mmol/L (ref 135–145)

## 2023-04-19 LAB — CBC
HCT: 21.3 % — ABNORMAL LOW (ref 39.0–52.0)
Hemoglobin: 7 g/dL — ABNORMAL LOW (ref 13.0–17.0)
MCH: 30.2 pg (ref 26.0–34.0)
MCHC: 32.9 g/dL (ref 30.0–36.0)
MCV: 91.8 fL (ref 80.0–100.0)
Platelets: 303 10*3/uL (ref 150–400)
RBC: 2.32 MIL/uL — ABNORMAL LOW (ref 4.22–5.81)
RDW: 14.6 % (ref 11.5–15.5)
WBC: 6.6 10*3/uL (ref 4.0–10.5)
nRBC: 0 % (ref 0.0–0.2)

## 2023-04-19 LAB — PREPARE RBC (CROSSMATCH)

## 2023-04-19 LAB — HEMOGLOBIN AND HEMATOCRIT, BLOOD
HCT: 28 % — ABNORMAL LOW (ref 39.0–52.0)
Hemoglobin: 9.3 g/dL — ABNORMAL LOW (ref 13.0–17.0)

## 2023-04-19 MED ORDER — SODIUM CHLORIDE 0.9% IV SOLUTION: Freq: Once | INTRAVENOUS | Status: AC

## 2023-04-19 MED ORDER — PANTOPRAZOLE SODIUM 40 MG PO TBEC
40.0000 mg | DELAYED_RELEASE_TABLET | Freq: Two times a day (BID) | ORAL | Status: DC
  Administered 2023-04-19 – 2023-04-24 (×10): 40 mg via ORAL
  Filled 2023-04-19 (×10): qty 1

## 2023-04-19 NOTE — Plan of Care (Signed)

## 2023-04-19 NOTE — Plan of Care (Signed)
  Problem: Pain Managment: Goal: General experience of comfort will improve Outcome: Progressing   Problem: Safety: Goal: Ability to remain free from injury will improve Outcome: Progressing   

## 2023-04-19 NOTE — Progress Notes (Signed)
    Progress Note   Assessment    62 year old male with hx of HFrEF (EF now normalized), PAF on Eliquis (currently held), COPD, CAD, alcohol abuse admitted with hemorrhagic pancreatitis, intermittent hematochezia, found to have a AAA with ulcerative mural plaque/thrombus -- EGD colonoscopy May 2024 for GI bleeding while hospitalized; small hiatal hernia; colonic polyps removed; diverticulosis felt to explain GI bleeding at that time  Recommendations   Acute pancreatitis with hemorrhage --alcohol related; negative for gallstones.  Slowly improving.  Hemoglobin trending down without further bleeding -- Continue supportive care; fluids and pain control -- I have advance to soft diet this evening -- Consider repeat imaging this weekend; 1 week after last CT -- Would hold Eliquis for now -- I have continued to stress the need for alcohol abstinence permanently  2.  Hematochezia --resolved for now; though hemoglobin has continued to drift down -- Some component of hemorrhagic pancreatitis -- Transfusion threshold 7.0 -- Hold Eliquis for now    Chief Complaint   Intermittent abd pain  Wants to try soft foods, currently on full liquids No blood in stools No fevers  Vital signs in last 24 hours: Temp:  [98.3 F (36.8 C)-98.7 F (37.1 C)] 98.3 F (36.8 C) (10/02 1512) Pulse Rate:  [78-94] 79 (10/02 1512) Resp:  [16-20] 16 (10/02 1512) BP: (101-119)/(66-79) 111/77 (10/02 1512) SpO2:  [97 %-99 %] 98 % (10/02 1512) Weight:  [99.7 kg] 99.7 kg (10/02 0500) Last BM Date : 04/14/23 Gen: awake, alert, NAD HEENT: anicteric  CV: RRR, no mrg Pulm: CTA b/l Abd: soft, tender epigastric, mildly distended, +BS throughout Ext: no c/c/e Neuro: nonfocal  Intake/Output from previous day: 10/01 0701 - 10/02 0700 In: 1080 [P.O.:1080] Out: 850 [Urine:850] Intake/Output this shift: No intake/output data recorded.  Lab Results: Recent Labs    04/17/23 0629 04/18/23 0305 04/19/23 0601   WBC 8.4 8.2 6.6  HGB 8.0* 7.7* 7.0*  HCT 24.2* 23.6* 21.3*  PLT 320 323 303   BMET Recent Labs    04/17/23 0629 04/18/23 0305 04/19/23 0601  NA 135 137 135  K 3.4* 3.2* 3.9  CL 107 107 108  CO2 21* 22 20*  GLUCOSE 95 113* 105*  BUN 5* 6* 5*  CREATININE 0.72 0.82 0.70  CALCIUM 7.5* 7.4* 7.3*   LFT Recent Labs    04/18/23 0305  PROT 4.5*  ALBUMIN 1.8*  AST 23  ALT 19  ALKPHOS 97  BILITOT 0.4      LOS: 13 days   Beverley Fiedler, MD 04/19/2023, 7:54 PM See Loretha Stapler, [facility], to contact our on call provider

## 2023-04-19 NOTE — Progress Notes (Signed)
PROGRESS NOTE    Luis Marquez  JWJ:[id number] DOB: 1960/10/04 DOA: 04/06/2023 PCP: Claiborne Rigg, NP  Mr. Luis Marquez is a 62 y.o. M with HFrEF EF 20-25%, pAF on Eliquis, COPD, alcohol use and hx DTs, and CAD who presented with epigastric pain.History of intermittent hematochezia for months, had a colonoscopy 5/24, multiple polyps removed In the ER, CT showed pancreatitis, possible pseudoaneurysm,   and abdominal aortic aneurysm now 3.7cm  w/enhancing ulcer along the mural plaque and thrombus along the lower abdominal Aorta.  Lipase >1000.  -Seen by IR, recommended supportive care on admission -Pancreatitis slowly improving, diet advanced -9/27 with multiple episodes of hematochezia and worsening abdominal pain -9/28, hemoglobin down to 6.6, transfusing, obtain CTA, GI consulted -CT with some worsening pancreatitis, pseudoaneurysm not visualized, pancreatic head with?  Hematoma  Subjective: -Feels better, wants to know if he can have solids today, no bleeding yesterday, moderate abdominal pain persists  Assessment and Plan:  Acute pancreatitis, alcoholic Pseudoaneurysm -Prior heavy alcoholism, quit few weeks ago, ultrasound negative for cholelithiasis -Initial CT with concern for pseudoaneurysm, not seen on repeat imaging 9/27, concern for hematoma in the pancreatic head -Worsened after initial improvement, now improving again, continue full liquid diet, pain control, PPI, stop fluids today -Appreciate GI input  Anemia Hematochezia -History of ongoing hematochezia for months, last colonoscopy 6/24 multiple polyps were removed, diverticulosis -Recurrent bleeding 9/27, transfused PRBC, repeat CTA abdomen pelvis with no GI bleeding -Hemoglobin in the 7-8 range, now 7.0, will transfuse another unit of PRBC, given IV iron -Eliquis on hold, no active bleeding in last 48 hours -Gastroenterology following  AAA with ulcer and mural plaque, thrombus - Discussed with vascular surgery Dr.  Myra Gianotti last week, imaging reviewed, recommended blood pressure control, outpatient follow-up in few months -BP stable  Hypomagnesemia Supplemented  Paroxysmal atrial fibrillation (HCC) HR controlled -Eliquis and metoprolol on hold  Chronic systolic CHF (congestive heart failure) (HCC) -Repeat echo with normalization of EF, remains euvolemic -High risk of third spacing with low albumin, monitor volume status closely while on fluids, stopping fluids today  AKI (acute kidney injury) (HCC) Had AKI with Cr up to 3 mg/dL 2 weeks PTA in the setting of ARB use and diarrhea and hypotension. -Resolved, losartan on hold  Essential hypertension BP normal off meds - Hold losartan  Emphysema lung (HCC) - Continue ICS/LABA  Severe hypoalbuminemia -Suspect chronic liver disease from EtOH abuse  DVT prophylaxis: SCDs Code Status: Full code Family Communication: None present Disposition Plan: To be determined  Consultants:    Procedures:   Antimicrobials:    Objective: Vitals:   04/19/23 0500 04/19/23 0517 04/19/23 0728 04/19/23 0802  BP:  101/66 109/79   Pulse:  78 90 83  Resp:   16 16  Temp:  98.3 F (36.8 C) 98.7 F (37.1 C)   TempSrc:  Oral Oral   SpO2:  97% 98% 98%  Weight: 99.7 kg     Height:        Intake/Output Summary (Last 24 hours) at 04/19/2023 1119 Last data filed at 04/18/2023 1700 Gross per 24 hour  Intake 720 ml  Output 500 ml  Net 220 ml    Filed Weights   04/17/23 0500 04/18/23 0500 04/19/23 0500  Weight: 100.2 kg 99.6 kg 99.7 kg    Examination:  General exam: Obese chronically ill male laying in bed, AAOx3 HEENT: Mildly elevated JVD CVS: S1-S2, regular rhythm Lungs: Decreased breath sounds at the bases Abdomen: Firm, mildly distended, nontender today, bowel  sounds present   extremities: Trace edema Skin: No rashes Psychiatry:  Mood & affect appropriate.     Data Reviewed:   CBC: Recent Labs  Lab 04/15/23 1151 04/16/23 0238  04/17/23 0629 04/18/23 0305 04/19/23 0601  WBC 7.3 8.5 8.4 8.2 6.6  HGB 7.4* 7.4* 8.0* 7.7* 7.0*  HCT 22.4* 22.7* 24.2* 23.6* 21.3*  MCV 89.2 91.2 93.1 92.9 91.8  PLT 340 318 320 323 303   Basic Metabolic Panel: Recent Labs  Lab 04/15/23 0224 04/16/23 0238 04/17/23 0629 04/18/23 0305 04/19/23 0601  NA 134* 132* 135 137 135  K 3.1* 3.6 3.4* 3.2* 3.9  CL 99 103 107 107 108  CO2 23 24 21* 22 20*  GLUCOSE 121* 88 95 113* 105*  BUN 8 5* 5* 6* 5*  CREATININE 0.86 0.73 0.72 0.82 0.70  CALCIUM 7.6* 7.4* 7.5* 7.4* 7.3*   GFR: Estimated Creatinine Clearance: 115.8 mL/min (by C-G formula based on SCr of 0.7 mg/dL). Liver Function Tests: Recent Labs  Lab 04/14/23 1250 04/16/23 0238 04/17/23 0629 04/18/23 0305  AST 26 22 22 23   ALT 19 17 18 19   ALKPHOS 117 96 93 97  BILITOT 0.7 0.7 0.7 0.4  PROT 5.4* 4.3* 4.3* 4.5*  ALBUMIN 2.0* 1.8* 1.8* 1.8*   Recent Labs  Lab 04/14/23 1250 04/17/23 0629  LIPASE 118* 82*    No results for input(s): "AMMONIA" in the last 168 hours. Coagulation Profile: No results for input(s): "INR", "PROTIME" in the last 168 hours. Cardiac Enzymes: No results for input(s): "CKTOTAL", "CKMB", "CKMBINDEX", "TROPONINI" in the last 168 hours. BNP (last 3 results) No results for input(s): "PROBNP" in the last 8760 hours. HbA1C: No results for input(s): "HGBA1C" in the last 72 hours. CBG: No results for input(s): "GLUCAP" in the last 168 hours.  Lipid Profile: No results for input(s): "CHOL", "HDL", "LDLCALC", "TRIG", "CHOLHDL", "LDLDIRECT" in the last 72 hours. Thyroid Function Tests: No results for input(s): "TSH", "T4TOTAL", "FREET4", "T3FREE", "THYROIDAB" in the last 72 hours. Anemia Panel: No results for input(s): "VITAMINB12", "FOLATE", "FERRITIN", "TIBC", "IRON", "RETICCTPCT" in the last 72 hours.  Urine analysis:    Component Value Date/Time   COLORURINE YELLOW 04/07/2023 0512   APPEARANCEUR CLEAR 04/07/2023 0512   LABSPEC 1.034 (H)  04/07/2023 0512   PHURINE 5.0 04/07/2023 0512   GLUCOSEU NEGATIVE 04/07/2023 0512   HGBUR SMALL (A) 04/07/2023 0512   BILIRUBINUR NEGATIVE 04/07/2023 0512   KETONESUR NEGATIVE 04/07/2023 0512   PROTEINUR NEGATIVE 04/07/2023 0512   NITRITE NEGATIVE 04/07/2023 0512   LEUKOCYTESUR NEGATIVE 04/07/2023 0512   Sepsis Labs: @LABRCNTIP (procalcitonin:4,lacticidven:4)  )No results found for this or any previous visit (from the past 240 hour(s)).   Radiology Studies: No results found.   Scheduled Meds:  mometasone-formoterol  2 puff Inhalation BID   multivitamin with minerals   Oral Daily   pantoprazole  40 mg Oral BID   Continuous Infusions:     LOS: 13 days    Time spent:    Zannie Cove, MD [facility]   04/19/2023, 11:19 AM

## 2023-04-20 DIAGNOSIS — K852 Alcohol induced acute pancreatitis without necrosis or infection: Secondary | ICD-10-CM | POA: Diagnosis not present

## 2023-04-20 DIAGNOSIS — D649 Anemia, unspecified: Secondary | ICD-10-CM | POA: Diagnosis not present

## 2023-04-20 LAB — TYPE AND SCREEN
ABO/RH(D): O POS
Antibody Screen: NEGATIVE
Unit division: 0

## 2023-04-20 LAB — BPAM RBC
Blood Product Expiration Date: 202410082359
ISSUE DATE / TIME: 202410021258
Unit Type and Rh: 5100

## 2023-04-20 MED ORDER — METOPROLOL TARTRATE 12.5 MG HALF TABLET
12.5000 mg | ORAL_TABLET | Freq: Two times a day (BID) | ORAL | Status: DC
  Administered 2023-04-20 – 2023-04-23 (×8): 12.5 mg via ORAL
  Filled 2023-04-20 (×8): qty 1

## 2023-04-20 MED ORDER — ENSURE ENLIVE PO LIQD
237.0000 mL | Freq: Three times a day (TID) | ORAL | Status: DC
  Administered 2023-04-20 – 2023-04-23 (×9): 237 mL via ORAL

## 2023-04-20 MED ORDER — THIAMINE MONONITRATE 100 MG PO TABS
100.0000 mg | ORAL_TABLET | Freq: Every day | ORAL | Status: DC
  Administered 2023-04-21 – 2023-04-24 (×4): 100 mg via ORAL
  Filled 2023-04-20 (×4): qty 1

## 2023-04-20 MED ORDER — FOLIC ACID 1 MG PO TABS
1.0000 mg | ORAL_TABLET | Freq: Every day | ORAL | Status: DC
  Administered 2023-04-21 – 2023-04-24 (×4): 1 mg via ORAL
  Filled 2023-04-20 (×4): qty 1

## 2023-04-20 NOTE — Progress Notes (Addendum)
Attending physician's note   I have taken a history, reviewed the chart, and examined the patient. I performed a substantive portion of this encounter, including complete performance of at least one of the key components, in conjunction with the APP. I agree with the APP's note, impression, and recommendations with my edits.  Tolerated advancing diet yesterday including solid dinner.  Awoke around 2 AM with nausea/vomiting.  No emesis since then.  Tolerated breakfast without issue.  Still with abdominal pain, but states this is overall improving.  No overt bleeding.  Robust response to 1 unit PRBCs.  - nCBC check in the morning - Tentative plan for repeat CT on Saturday for serial imaging.  If change in exam, will get done tomorrow instead [address]  [place], [place], [place] [phone] office         Daily Progress Note  DOA: 04/06/2023 Hospital Day: 15  Chief Complaint: hematochezia and pancreatitis.   ASSESSMENT    Brief Narrative:  Luis Marquez is a 62 y.o. year old male with a history of  colon polyps, systolic CHF (EF now normalized), PAF on Eliquis, CAD, COPD, alcohol abuse with h/o DTs, AAA with ulcerative mural plaque/thrombus. Admitted 9/19 with abdominal pain, blood in stool and pancreatitis. GI saw in consult 9/28   Hemorrhagic acute Etoh pancreatitis (initially there was concern for pseudoaneurysm but not seen on follow up imaging).  Diet advanced to solids yesterday. Vomited around 2 am and having abdominal pain today but doesn't think eating caused it. Still requiring frequent opioids  Hematochezia, intermittent.  Had EGD and colonoscopy in May 2024 for hematochezia but no cause found and presumed to be diverticular bleed.  CTA this admission on 04/17/23 negative for active bleeding  *No bleeding in last few days. Reports normal BM without blood yesterday  Anemia in setting of intermittent hematochezia and also ? Some blood loss from hemorrhagic  pancreatitis Has received 2 u pRBCs this admission, Last unit was yesterday afternoon for hgb of 7. Today hgb is 9.3 ( higher than expected after just one unit so expect some decline with equilibration)  Afib, Eliquis on hold   PLAN   -Changing to low fat diet -Continue BID Pantoprazole -Continue prn bowel regimen given significant opiod use   Subjective   Mid upper abdominal pain since vomiting around 2 am. Vomited dark liquid. Had first solid meal yesterday but doesn't think it caused the vomiting. Had solids for breakfast and so far no N/V.    Objective     Recent Labs    04/18/23 0305 04/19/23 0601 04/19/23 2012  WBC 8.2 6.6  --   HGB 7.7* 7.0* 9.3*  HCT 23.6* 21.3* 28.0*  PLT 323 303  --    BMET Recent Labs    04/18/23 0305 04/19/23 0601  NA 137 135  K 3.2* 3.9  CL 107 108  CO2 22 20*  GLUCOSE 113* 105*  BUN 6* 5*  CREATININE 0.82 0.70  CALCIUM 7.4* 7.3*   LFT Recent Labs    04/18/23 0305  PROT 4.5*  ALBUMIN 1.8*  AST 23  ALT 19  ALKPHOS 97  BILITOT 0.4   PT/INR No results for input(s): "LABPROT", "INR" in the last 72 hours.   Imaging:  CT Angio Abd/Pel w/ and/or w/o CLINICAL DATA:  Lower GI bleeding.  Acute pancreatitis.  EXAM: CTA ABDOMEN AND PELVIS WITHOUT AND WITH CONTRAST  TECHNIQUE: Multidetector CT imaging of the abdomen and pelvis was performed  using the standard protocol during bolus administration of intravenous contrast. Multiplanar reconstructed images and MIPs were obtained and reviewed to evaluate the vascular anatomy.  RADIATION DOSE REDUCTION: This exam was performed according to the departmental dose-optimization program which includes automated exposure control, adjustment of the mA and/or kV according to patient size and/or use of iterative reconstruction technique.  CONTRAST:  OMNIPAQUE IOHEXOL 350 MG/ML SOLN  COMPARISON:  Prior CT scan of the abdomen and pelvis 04/06/2023  FINDINGS: VASCULAR  Aorta:  Irregular aneurysmal dilation of the infrarenal abdominal aortic with significant multifocal penetrating atherosclerotic ulcerations. The aorta is tortuous. Maximal aortic diameter is 3.6 cm (see coronal reformatted images).  Celiac: Patent without evidence of aneurysm, dissection, vasculitis or significant stenosis.  SMA: Patent without evidence of aneurysm, dissection, vasculitis or significant stenosis.  Renals: Both renal arteries are patent without evidence of aneurysm, dissection, vasculitis, fibromuscular dysplasia or significant stenosis. Small accessory artery to the upper pole of the left kidney.  IMA: Patent without evidence of aneurysm, dissection, vasculitis or significant stenosis.  Inflow: Patent without evidence of aneurysm, dissection, vasculitis or significant stenosis.  Proximal Outflow: Bilateral common femoral and visualized portions of the superficial and profunda femoral arteries are patent without evidence of aneurysm, dissection, vasculitis or significant stenosis.  Veins: No focal venous abnormality.  Review of the MIP images confirms the above findings.  NON-VASCULAR  Lower chest: No acute abnormality. Mild centrilobular pulmonary emphysema.  Hepatobiliary: Low attenuation of the hepatic parenchyma with sparing adjacent to the gallbladder fossa consistent with steatosis. Normal hepatic contour and morphology. No discrete hepatic lesions. Normal appearance of the gallbladder. No intra or extrahepatic biliary ductal dilatation.  Pancreas: Progressive acute severe pancreatitis with increased edema and stranding along the pancreatic tail and head compared to the prior imaging. Rounded focus of higher attenuation posterior to the pancreatic head measures 2.1 x 1.9 cm and likely represents a focus of hematoma in the setting of hemorrhagic pancreatitis. No visible pseudoaneurysm or active bleeding.  Spleen: Normal in size without focal  abnormality.  Adrenals/Urinary Tract: Adrenal glands are unremarkable. Kidneys are normal, without renal calculi, focal solid lesion, or hydronephrosis. Bladder is unremarkable. Benign cyst exophytic from the medial lower pole of the right kidney. No imaging follow-up is recommended.  Stomach/Bowel: Colonic diverticular disease without CT evidence of active inflammation. No evidence of active lower GI bleeding. The appendix is normal. Secondary reactive thickening is present of the duodenum in the second and third portions secondary to the underlying pancreatitis.  Lymphatic: No suspicious lymphadenopathy.  Reproductive: Prostate is unremarkable.  Other: No abdominal wall hernia or abnormality. No abdominopelvic ascites.  Musculoskeletal: No acute or significant osseous findings. Stable chronic T12 compression fracture.  IMPRESSION: VASCULAR  1. No evidence of active GI bleeding at the time of this scan. 2. No evidence of arterial pseudoaneurysm or venous thrombosis as a complication from acute pancreatitis. 3. Irregular aneurysmal dilation of the infrarenal abdominal aorta with highly irregular/ulcerated atherosclerotic plaque and multifocal penetrating atherosclerotic ulcerations. Maximal aortic diameter is 3.6 cm. Recommend follow-up ultrasound every 3 years. (Ref.: J Vasc Surg. 2018; 67:2-77 and J Am Coll Radiol 2013;10(10):789-794.)  NON-VASCULAR  1. Progressive acute pancreatitis with increased inflammatory changes about the pancreatic head and new involvement of the pancreatic tail. Additionally, findings suggest hemorrhagic pancreatitis with development of a rounded 2.1 x 1.9 cm high attenuation focus posterior and inferior to the pancreatic head likely representing a region of hematoma. A smaller focus of probable bleeding was present on the  prior study from 04/06/2023. Importantly, no definite pseudoaneurysm or active bleeding was visible on the arterial phase  images. The close proximity of this focus of hemorrhagic pancreatitis to the immediately adjacent duodenum could result in some manifestation of GI bleeding. 2. Additional ancillary findings as above including mild centrilobular pulmonary emphysema, hepatic steatosis, and extensive colonic diverticulosis.  Aortic aneurysm NOS (ICD10-I71.9); Aortic Atherosclerosis (ICD10-I70.0) and Emphysema (ICD10-J43.9).  Signed,  Sterling Big, MD, RPVI  [facility]  [facility]  Electronically Signed   By: Malachy Moan M.D.   On: 04/16/2023 08:00     Scheduled inpatient medications:   metoprolol tartrate  12.5 mg Oral BID   mometasone-formoterol  2 puff Inhalation BID   multivitamin with minerals   Oral Daily   pantoprazole  40 mg Oral BID   Continuous inpatient infusions:  PRN inpatient medications: bisacodyl, hydrALAZINE, ipratropium-albuterol, lactulose, morphine injection, naphazoline-glycerin, ondansetron **OR** ondansetron (ZOFRAN) IV, senna-docusate  Vital signs in last 24 hours: Temp:  [97.9 F (36.6 C)-98.6 F (37 C)] 97.9 F (36.6 C) (10/03 0948) Pulse Rate:  [79-102] 84 (10/03 0948) Resp:  [16-18] 17 (10/03 0948) BP: (106-130)/(75-87) 127/87 (10/03 0948) SpO2:  [93 %-99 %] 93 % (10/03 0948) Weight:  [104.7 kg] 104.7 kg (10/03 0500) Last BM Date : 04/19/23  Intake/Output Summary (Last 24 hours) at 04/20/2023 1143 Last data filed at 04/20/2023 0516 Gross per 24 hour  Intake 799 ml  Output 850 ml  Net -51 ml    Intake/Output from previous day: 10/02 0701 - 10/03 0700 In: 799 [P.O.:480; Blood:319] Out: 850 [Urine:850] Intake/Output this shift: No intake/output data recorded.   Physical Exam:  General: Alert male in NAD Heart:  Regular rate and rhythm.  Pulmonary: Normal respiratory effort Abdomen: Soft, nondistended, mild-mod upper abdominal tenderness. . Normal bowel sounds. Extremities: No lower  extremity edema  Neurologic: Alert and oriented Psych: Pleasant. Cooperative. Insight appears normal.    Principal Problem:   Acute pancreatitis Active Problems:   Anemia   Hypocalcemia   Emphysema lung (HCC)   Essential hypertension   AKI (acute kidney injury) (HCC)   Chronic systolic CHF (congestive heart failure) (HCC)   Paroxysmal atrial fibrillation (HCC)   Aortic atherosclerosis (HCC)   Hypomagnesemia   Hyponatremia   Hematochezia     LOS: 14 days   Willette Cluster ,NP 04/20/2023, 11:43 AM

## 2023-04-20 NOTE — Progress Notes (Signed)
Initial Nutrition Assessment  DOCUMENTATION CODES:   Not applicable  INTERVENTION:  Encouraged adequate intake of calories and protein at meal. Encouraged pt to choose a good source of protein and reviewed which foods contain protein.  Provide Ensure Enlive po TID, each supplement provides 350 kcal and 20 grams of protein. Pt prefers vanilla flavor.  Continue multivitamin with minerals po daily. Also provide thiamine 100 mg daily and folic acid 1 mg daily.  RD provided "Pancreatitis Nutrition Therapy" handout from the Academy of [employer]. Discussed role of the pancreas and how it plays a role in digestion of food. Reviewed that foods high in fat are especially hard to digest and can cause pain. Discussed recommendation for low fat diet. Encouraged intake of small, frequent meals throughout the day to help ease pain and encouraged patient to drink adequate fluids. Provided a list of foods not recommended on a low-fat diet and discussed alternatives patient will enjoy.   NUTRITION DIAGNOSIS:   Increased nutrient needs related to catabolic illness (pancreatitis, CHF, COPD) as evidenced by estimated needs.  GOAL:   Patient will meet greater than or equal to 90% of their needs  MONITOR:   PO intake, Supplement acceptance, Labs, Weight trends, I & O's  REASON FOR ASSESSMENT:   Consult Assessment of nutrition requirement/status  ASSESSMENT:   62 year old male with PMHx of systolic CHF (EF now normalized), PAF on Eliquis, CAD, COPD, colon polyps, EtOH abuse, AAA with ulcerative mural plaque/thrombus who is admitted with acute EtOH-related hemorrhagic pancreatitis, acute GI bleeding.  Met with pt at bedside. He reports he has had a decreased appetite for the past 5 months (since May 2024). He reports prior to admission he would eat 1-2 meals per day. He reports he was staying in a room that had a microwave and small stove with two burners but he didn't have any pots or pans  because he wasn't sure how long he would be there. He reports he was mainly eating frozen meals or cereal. He was choosing options that were lower in sodium. He reports his appetite is starting to pick up now and he is eating better at meals. Pt was ordered for heart healthy diet today as we do not currently have a "low fat" diet option. Per chart pt has been eating 90-100% of meals here. Discussed importance of adequate intake. Encouraged adequate protein at meals. Pt also amenable to drinking Ensure to help meet protein needs. Also answered questions about low fat diet and provided handout for pt. Pt is at risk for development of malnutrition.  Pt reports his UBW was 207 lbs and that his weight has been trending up. Noted on admission pt was 94.1 kg (207.45 lbs). Weights have trended up this admission to around 99-100 kg. Suspect possibly related to fluid per review of chart. Unsure of accuracy of wt today of 104.7 kg as it is 5 kg higher than weight yesterday.  Medications reviewed and include: MVI daily, pantoprazole  Labs reviewed: CO2 20, BUN 5  UOP: 850 mL (0.3 mL/kg/hr) + 1 occurrence unmeasured UOP in previous 24 hrs  I/O: +3355.2 mL since admission  NUTRITION - FOCUSED PHYSICAL EXAM:  Flowsheet Row Most Recent Value  Orbital Region No depletion  Upper Arm Region Mild depletion  Thoracic and Lumbar Region No depletion  Buccal Region No depletion  Temple Region No depletion  Clavicle Bone Region Mild depletion  Clavicle and Acromion Bone Region Mild depletion  Scapular Bone Region No  depletion  Dorsal Hand No depletion  Patellar Region Moderate depletion  Anterior Thigh Region Moderate depletion  Posterior Calf Region Moderate depletion  Edema (RD Assessment) Mild  Hair Reviewed  Eyes Reviewed  Mouth Reviewed  Skin Reviewed  Nails Reviewed      Diet Order:   Diet Order             Diet Heart Room service appropriate? Yes; Fluid consistency: Thin  Diet effective now                    EDUCATION NEEDS:   Education needs have been addressed  Skin:  Skin Assessment: Reviewed RN Assessment  Last BM:  04/19/23 - small type 1  Height:   Ht Readings from Last 1 Encounters:  04/06/23 6\' 1"  (1.854 m)   Weight:   Wt Readings from Last 1 Encounters:  04/20/23 104.7 kg   BMI:  Body mass index is 30.45 kg/m.  Estimated Nutritional Needs:   Kcal:  2300-2500  Protein:  115-125 grams  Fluid:  2.3-2.5 L/day  Letta Median, MS, RD, LDN, CNSC Pager number available on Amion

## 2023-04-20 NOTE — Plan of Care (Signed)

## 2023-04-20 NOTE — Progress Notes (Signed)
PROGRESS NOTE  Luis Marquez  DOB: 11-13-1960  PCP: Claiborne Rigg, NP [id number]  DOA: 04/06/2023  LOS: 14 days  Hospital Day: 15  Brief narrative: Luis Marquez is a 62 y.o. male with PMH significant for systolic CHF (EF now normalized), PAF on Eliquis, CAD, COPD, alcohol abuse with h/o DTs 9/9, patient presented to the ED with complaint of epigastric pain, intermittent hematochezia for months.  Initial workup with CT abdomen showed acute pancreatitis with developing pseudoaneurysm and pancreatic head.  It also showed a AAA with enhancing ulcer along with mural plaque and thrombus Lipase level was over 1000 Admitted to [facility]  Over the course of next several days, pancreatitis slowly improved.  Diet was advanced 9/27, patient had  multiple episodes of hematochezia and worsening abdominal pain 9/28, hemoglobin down to 6.6, transfusion given CT angio abdomen/pelvis was obtained which did not show any evidence of active GI bleeding.  It did not show any evidence of pancreatic pseudoaneurysm or venous thrombosis but it showed progressive acute pancreatitis with increased inflammation around the pancreatic head and new involvement of the tail with an additional finding of a 2.1 cm hematoma in the pancreatic head  GI consultation was obtained.   Subjective: Patient was seen and examined this morning.  Pleasant elderly Caucasian male.  Lying down in bed.  Feels okay after soft diet this morning.  Not in distress. No family at bedside. Chart reviewed. Afebrile, hemodynamically stable.  Last set of labs from 10/2 with hemoglobin 9.3  Assessment and plan: Acute hemorrhagic pancreatitis, alcohol-related  Initially admitted for acute pancreatitis with improvement, but has had both worsened again.  Repeat imaging showed large transformation. Imagings negative for cholelithiasis Treated with conservative management with supportive care, fluids, pain control, PPI. Lipase level improving.   Fluid has been stopped.  Diet has been advanced to soft diet Eliquis on hold Tentative plan of repeat CT scan this weekend, 1 week after the last CT. Recent Labs  Lab 04/14/23 1250 04/17/23 0629  LIPASE 118* 82*   Acute GI bleeding  Acute blood loss anemia On admission, patient mentioned intermittent hematochezia for months.  Last colonoscopy 6/24 multiple polyps were removed, diverticulosis During this hospitalization, on 9/27, patient had recurrence of hematochezia with drop in hemoglobin to lowest of 7.  Blood transfusion was given. CT angio without any obvious source.  Eliquis held GI was consulted. Hematochezia seems to have slowed down. No current plan of repeat colonoscopy Recent Labs    12/23/22 1142 03/21/23 1450 04/13/23 0841 04/14/23 1250 04/16/23 0238 04/17/23 0629 04/18/23 0305 04/19/23 0601 04/19/23 2012  HGB 13.7   < >  --    < > 7.4* 8.0* 7.7* 7.0* 9.3*  MCV 98.7   < >  --    < > 91.2 93.1 92.9 91.8  --   VITAMINB12  --   --  211  --   --   --   --   --   --   FOLATE  --   --  20.7  --   --   --   --   --   --   FERRITIN 222.5  --  433*  --   --   --   --   --   --   TIBC 271.6  --  102*  --   --   --   --   --   --   IRON 50  --  12*  --   --   --   --   --   --  RETICCTPCT  --   --  3.7*  --   --   --   --   --   --    < > = values in this interval not displayed.   AAA with ulcer and mural plaque, thrombus Previous hospitalist discussed with vascular surgery Dr. Myra Gianotti last week.  Imaging reviewed, recommended blood pressure control, outpatient follow-up in few months BP remains stable    Paroxysmal atrial fibrillation Metoprolol on hold.  Hemodynamically stable, resume today. Eliquis on hold due to bleeding   Bilateral pedal edema Chronic systolic CHF Essential hypertension Received a lot of IV hydration because of pancreatitis.  Leading to generalized anasarca.  Has 1+ bilateral pedal edema.  Albumin level is low as well. Blood pressure is  stable.  Metoprolol resumed today.  Losartan remains on hold.  If blood pressure remains stable, we can try some Lasix tomorrow.  Encourage ambulation. Previous echo from April 2023 had shown EF of 20 to 25%.  Repeat echo this hospitalization showed significant improvement in EF to normal.     COPD Continue ICS/LABA   Severe hypoalbuminemia Albumin level less than 2 consistently.  Suspect chronic liver disease from EtOH abuse Dietitian consult    Mobility: Encourage ambulation  Goals of care   Code Status: Full Code     DVT prophylaxis: Unable to use anticoagulation because of GI bleeding.  Start SCDsPlace and maintain sequential compression device Start: 04/20/23 0920   Antimicrobials: None Fluid: Held because of edema Consultants: GI Family Communication: None at bedside  Level of care:  Med-Surg   Patient is from: Lives alone, lately in a hotel suite.  Does not have a home Needs to continue in-hospital care: Gradual recovery from pancreatitis.  Plan to repeat CT scan this weekend Anticipated d/c to: Hopefully by Monday if continues to improve      Diet:  Diet Order             DIET SOFT Fluid consistency: Thin  Diet effective now                   Scheduled Meds:  metoprolol tartrate  12.5 mg Oral BID   mometasone-formoterol  2 puff Inhalation BID   multivitamin with minerals   Oral Daily   pantoprazole  40 mg Oral BID    PRN meds: bisacodyl, hydrALAZINE, ipratropium-albuterol, lactulose, morphine injection, naphazoline-glycerin, ondansetron **OR** ondansetron (ZOFRAN) IV, senna-docusate   Infusions:    Antimicrobials: Anti-infectives (From admission, onward)    None       Objective: Vitals:   04/20/23 0821 04/20/23 0948  BP:  127/87  Pulse:  84  Resp:  17  Temp:  97.9 F (36.6 C)  SpO2: 96% 93%    Intake/Output Summary (Last 24 hours) at 04/20/2023 1108 Last data filed at 04/20/2023 0516 Gross per 24 hour  Intake 799 ml  Output 850  ml  Net -51 ml   Filed Weights   04/18/23 0500 04/19/23 0500 04/20/23 0500  Weight: 99.6 kg 99.7 kg 104.7 kg   Weight change: 5 kg Body mass index is 30.45 kg/m.   Physical Exam: General exam: Pleasant, elderly Caucasian male. Skin: No rashes, lesions or ulcers. HEENT: Atraumatic, normocephalic, no obvious bleeding Lungs: Clear to auscultation bilaterally CVS: Regular rate and rhythm, no murmur GI/Abd soft, abdominal tenderness improving, bowel sound present CNS: Alert, awake, oriented x 3 Psychiatry: Mood appropriate Extremities: 1+ bilateral pedal edema, no calf tenderness  Data Review: I have personally  reviewed the laboratory data and studies available.  F/u labs ordered Unresulted Labs (From admission, onward)     Start     Ordered   04/21/23 0500  CBC with Differential/Platelet  Daily,   R     Question:  Specimen collection method  Answer:  Lab=Lab collect   04/20/23 1105   04/21/23 0500  Comprehensive metabolic panel  Daily,   R     Question:  Specimen collection method  Answer:  Lab=Lab collect   04/20/23 1105            Total time spent in review of labs and imaging, patient evaluation, formulation of plan, documentation and communication with family: 55 minutes  Signed, Lorin Glass, MD [facility] 04/20/2023

## 2023-04-21 ENCOUNTER — Ambulatory Visit: Payer: 59 | Admitting: Nurse Practitioner

## 2023-04-21 DIAGNOSIS — K852 Alcohol induced acute pancreatitis without necrosis or infection: Secondary | ICD-10-CM | POA: Diagnosis not present

## 2023-04-21 LAB — CBC WITH DIFFERENTIAL/PLATELET
Abs Immature Granulocytes: 0.05 10*3/uL (ref 0.00–0.07)
Basophils Absolute: 0 10*3/uL (ref 0.0–0.1)
Basophils Relative: 1 %
Eosinophils Absolute: 0.3 10*3/uL (ref 0.0–0.5)
Eosinophils Relative: 5 %
HCT: 24.7 % — ABNORMAL LOW (ref 39.0–52.0)
Hemoglobin: 8.1 g/dL — ABNORMAL LOW (ref 13.0–17.0)
Immature Granulocytes: 1 %
Lymphocytes Relative: 14 %
Lymphs Abs: 0.8 10*3/uL (ref 0.7–4.0)
MCH: 30.3 pg (ref 26.0–34.0)
MCHC: 32.8 g/dL (ref 30.0–36.0)
MCV: 92.5 fL (ref 80.0–100.0)
Monocytes Absolute: 0.5 10*3/uL (ref 0.1–1.0)
Monocytes Relative: 8 %
Neutro Abs: 4.4 10*3/uL (ref 1.7–7.7)
Neutrophils Relative %: 71 %
Platelets: 298 10*3/uL (ref 150–400)
RBC: 2.67 MIL/uL — ABNORMAL LOW (ref 4.22–5.81)
RDW: 15.2 % (ref 11.5–15.5)
WBC: 6.1 10*3/uL (ref 4.0–10.5)
nRBC: 0 % (ref 0.0–0.2)

## 2023-04-21 LAB — COMPREHENSIVE METABOLIC PANEL
ALT: 16 U/L (ref 0–44)
AST: 20 U/L (ref 15–41)
Albumin: 1.6 g/dL — ABNORMAL LOW (ref 3.5–5.0)
Alkaline Phosphatase: 87 U/L (ref 38–126)
Anion gap: 7 (ref 5–15)
BUN: 8 mg/dL (ref 8–23)
CO2: 24 mmol/L (ref 22–32)
Calcium: 7.7 mg/dL — ABNORMAL LOW (ref 8.9–10.3)
Chloride: 103 mmol/L (ref 98–111)
Creatinine, Ser: 0.72 mg/dL (ref 0.61–1.24)
GFR, Estimated: >60 mL/min (ref >60)
Glucose, Bld: 102 mg/dL — ABNORMAL HIGH (ref 70–99)
Potassium: 3.6 mmol/L (ref 3.5–5.1)
Sodium: 134 mmol/L — ABNORMAL LOW (ref 135–145)
Total Bilirubin: 0.4 mg/dL (ref 0.3–1.2)
Total Protein: 4.2 g/dL — ABNORMAL LOW (ref 6.5–8.1)

## 2023-04-21 NOTE — Plan of Care (Signed)

## 2023-04-21 NOTE — Progress Notes (Addendum)
Attending physician's note   I have taken a history, reviewed the chart, and examined the patient. I performed a substantive portion of this encounter, including complete performance of at least one of the key components, in conjunction with the APP. I agree with the APP's note, impression, and recommendations with my edits.  Tolerating p.o. without much issue today.  Still with some "abdominal tightness" but improved from yesterday.  Ambulating around room and halls has helped out.    H/H 8.1/24.7 today after 1 unit RBC transfusion yesterday.  Suspect the Hgb of 9 yesterday was spurious and 8.1 more reflective of expected posttransfusion value. - Continue daily CBC checks - BMP check in the morning - Plan for repeat CT tomorrow for serial imaging  Ann Held, FACG [phone] office         Daily Progress Note  DOA: 04/06/2023 Hospital Day: 16  Chief Complaint:   ASSESSMENT    Brief Narrative:  Luis Marquez is a 62 y.o. year old male with a history of  colon polyps, systolic CHF (EF now normalized), PAF on Eliquis, CAD, COPD, alcohol abuse with h/o DTs, AAA with ulcerative mural plaque/thrombus. Admitted 9/19 with abdominal pain, blood in stool and pancreatitis. GI saw in consult 9/28   Hemorrhagic acute Etoh pancreatitis (initially there was concern for pseudoaneurysm but not seen on follow up imaging).  Still requiring frequent opioids but pain is controlled   Hematochezia, intermittent.  Had EGD and colonoscopy in May 2024 for hematochezia but no cause found and presumed to be diverticular bleed.  CTA this admission on 04/17/23 negative for active bleeding.   Anemia in setting of intermittent hematochezia and also ? Some blood loss from hemorrhagic pancreatitis Hgb up appropriately from 7 to 8.1 after a unit of pRBCs yesterday   Afib, Eliquis on hold   PLAN   Repeat CT scan for serial imaging on Saturday   Subjective   No significant abdominal  pain at present but requiring frequent pain meds.   Objective    Recent Labs    04/19/23 0601 04/19/23 2012 04/21/23 0411  WBC 6.6  --  6.1  HGB 7.0* 9.3* 8.1*  HCT 21.3* 28.0* 24.7*  PLT 303  --  298   BMET Recent Labs    04/19/23 0601 04/21/23 0411  NA 135 134*  K 3.9 3.6  CL 108 103  CO2 20* 24  GLUCOSE 105* 102*  BUN 5* 8  CREATININE 0.70 0.72  CALCIUM 7.3* 7.7*   LFT Recent Labs    04/21/23 0411  PROT 4.2*  ALBUMIN 1.6*  AST 20  ALT 16  ALKPHOS 87  BILITOT 0.4   PT/INR No results for input(s): "LABPROT", "INR" in the last 72 hours.   Imaging:  CT Angio Abd/Pel w/ and/or w/o CLINICAL DATA:  Lower GI bleeding.  Acute pancreatitis.  EXAM: CTA ABDOMEN AND PELVIS WITHOUT AND WITH CONTRAST  TECHNIQUE: Multidetector CT imaging of the abdomen and pelvis was performed using the standard protocol during bolus administration of intravenous contrast. Multiplanar reconstructed images and MIPs were obtained and reviewed to evaluate the vascular anatomy.  RADIATION DOSE REDUCTION: This exam was performed according to the departmental dose-optimization program which includes automated exposure control, adjustment of the mA and/or kV according to patient size and/or use of iterative reconstruction technique.  CONTRAST:  OMNIPAQUE IOHEXOL 350 MG/ML SOLN  COMPARISON:  Prior CT scan of the abdomen and pelvis 04/06/2023  FINDINGS: VASCULAR  Aorta:  Irregular aneurysmal dilation of the infrarenal abdominal aortic with significant multifocal penetrating atherosclerotic ulcerations. The aorta is tortuous. Maximal aortic diameter is 3.6 cm (see coronal reformatted images).  Celiac: Patent without evidence of aneurysm, dissection, vasculitis or significant stenosis.  SMA: Patent without evidence of aneurysm, dissection, vasculitis or significant stenosis.  Renals: Both renal arteries are patent without evidence of aneurysm, dissection, vasculitis,  fibromuscular dysplasia or significant stenosis. Small accessory artery to the upper pole of the left kidney.  IMA: Patent without evidence of aneurysm, dissection, vasculitis or significant stenosis.  Inflow: Patent without evidence of aneurysm, dissection, vasculitis or significant stenosis.  Proximal Outflow: Bilateral common femoral and visualized portions of the superficial and profunda femoral arteries are patent without evidence of aneurysm, dissection, vasculitis or significant stenosis.  Veins: No focal venous abnormality.  Review of the MIP images confirms the above findings.  NON-VASCULAR  Lower chest: No acute abnormality. Mild centrilobular pulmonary emphysema.  Hepatobiliary: Low attenuation of the hepatic parenchyma with sparing adjacent to the gallbladder fossa consistent with steatosis. Normal hepatic contour and morphology. No discrete hepatic lesions. Normal appearance of the gallbladder. No intra or extrahepatic biliary ductal dilatation.  Pancreas: Progressive acute severe pancreatitis with increased edema and stranding along the pancreatic tail and head compared to the prior imaging. Rounded focus of higher attenuation posterior to the pancreatic head measures 2.1 x 1.9 cm and likely represents a focus of hematoma in the setting of hemorrhagic pancreatitis. No visible pseudoaneurysm or active bleeding.  Spleen: Normal in size without focal abnormality.  Adrenals/Urinary Tract: Adrenal glands are unremarkable. Kidneys are normal, without renal calculi, focal solid lesion, or hydronephrosis. Bladder is unremarkable. Benign cyst exophytic from the medial lower pole of the right kidney. No imaging follow-up is recommended.  Stomach/Bowel: Colonic diverticular disease without CT evidence of active inflammation. No evidence of active lower GI bleeding. The appendix is normal. Secondary reactive thickening is present of the duodenum in the second and  third portions secondary to the underlying pancreatitis.  Lymphatic: No suspicious lymphadenopathy.  Reproductive: Prostate is unremarkable.  Other: No abdominal wall hernia or abnormality. No abdominopelvic ascites.  Musculoskeletal: No acute or significant osseous findings. Stable chronic T12 compression fracture.  IMPRESSION: VASCULAR  1. No evidence of active GI bleeding at the time of this scan. 2. No evidence of arterial pseudoaneurysm or venous thrombosis as a complication from acute pancreatitis. 3. Irregular aneurysmal dilation of the infrarenal abdominal aorta with highly irregular/ulcerated atherosclerotic plaque and multifocal penetrating atherosclerotic ulcerations. Maximal aortic diameter is 3.6 cm. Recommend follow-up ultrasound every 3 years. (Ref.: J Vasc Surg. 2018; 67:2-77 and J Am Coll Radiol 2013;10(10):789-794.)  NON-VASCULAR  1. Progressive acute pancreatitis with increased inflammatory changes about the pancreatic head and new involvement of the pancreatic tail. Additionally, findings suggest hemorrhagic pancreatitis with development of a rounded 2.1 x 1.9 cm high attenuation focus posterior and inferior to the pancreatic head likely representing a region of hematoma. A smaller focus of probable bleeding was present on the prior study from 04/06/2023. Importantly, no definite pseudoaneurysm or active bleeding was visible on the arterial phase images. The close proximity of this focus of hemorrhagic pancreatitis to the immediately adjacent duodenum could result in some manifestation of GI bleeding. 2. Additional ancillary findings as above including mild centrilobular pulmonary emphysema, hepatic steatosis, and extensive colonic diverticulosis.  Aortic aneurysm NOS (ICD10-I71.9); Aortic Atherosclerosis (ICD10-I70.0) and Emphysema (ICD10-J43.9).  Signed,  Sterling Big, MD, RPVI  [facility]  [facility]  Radiology  Electronically Signed   By: Malachy Moan M.D.   On: 04/16/2023 08:00     Scheduled inpatient medications:   feeding supplement  237 mL Oral TID BM   folic acid  1 mg Oral Daily   metoprolol tartrate  12.5 mg Oral BID   mometasone-formoterol  2 puff Inhalation BID   multivitamin with minerals   Oral Daily   pantoprazole  40 mg Oral BID   thiamine  100 mg Oral Daily   Continuous inpatient infusions:  PRN inpatient medications: bisacodyl, hydrALAZINE, ipratropium-albuterol, lactulose, morphine injection, naphazoline-glycerin, ondansetron **OR** ondansetron (ZOFRAN) IV, senna-docusate  Vital signs in last 24 hours: Temp:  [98.4 F (36.9 C)-98.6 F (37 C)] 98.4 F (36.9 C) (10/04 0903) Pulse Rate:  [71-87] 71 (10/04 0903) Resp:  [17-20] 17 (10/04 0903) BP: (104-141)/(69-91) 116/85 (10/04 0903) SpO2:  [92 %-98 %] 98 % (10/04 0903) Weight:  [104.4 kg] 104.4 kg (10/04 0522) Last BM Date : 04/19/23  Intake/Output Summary (Last 24 hours) at 04/21/2023 1319 Last data filed at 04/21/2023 0900 Gross per 24 hour  Intake 480 ml  Output 1275 ml  Net -795 ml    Intake/Output from previous day: 10/03 0701 - 10/04 0700 In: 840 [P.O.:840] Out: 1225 [Urine:1225] Intake/Output this shift: Total I/O In: -  Out: 300 [Urine:300]   Physical Exam:  General: Alert male in NAD Heart:  Regular rate and rhythm.  Pulmonary: Normal respiratory effort Abdomen: Soft, nondistended, nontender. Normal bowel sounds. Neurologic: Alert and oriented Psych: Pleasant. Cooperative. Insight appears normal.    Principal Problem:   Acute pancreatitis Active Problems:   Anemia   Hypocalcemia   Emphysema lung (HCC)   Essential hypertension   AKI (acute kidney injury) (HCC)   Chronic systolic CHF (congestive heart failure) (HCC)   Paroxysmal atrial fibrillation (HCC)   Aortic atherosclerosis (HCC)   Hypomagnesemia   Hyponatremia   Hematochezia     LOS:  15 days   Willette Cluster ,NP 04/21/2023, 1:19 PM

## 2023-04-21 NOTE — Progress Notes (Signed)
PROGRESS NOTE  Luis Marquez  DOB: [birth year]  PCP: Claiborne Rigg, NP WJX:[id number]  DOA: 04/06/2023  LOS: 15 days  Hospital Day: 16  Brief narrative: Luis Marquez is a 62 y.o. male with PMH significant for systolic CHF (EF now normalized), PAF on Eliquis, CAD, COPD, alcohol abuse with h/o DTs 9/9, patient presented to the ED with complaint of epigastric pain, intermittent hematochezia for months.  Initial workup with CT abdomen showed acute pancreatitis with developing pseudoaneurysm and pancreatic head.  It also showed a AAA with enhancing ulcer along with mural plaque and thrombus Lipase level was over 1000 Admitted to [facility]  Over the course of next several days, pancreatitis slowly improved.  Diet was advanced 9/27, patient had  multiple episodes of hematochezia and worsening abdominal pain 9/28, hemoglobin down to 6.6, transfusion given CT angio abdomen/pelvis was obtained which did not show any evidence of active GI bleeding.  It did not show any evidence of pancreatic pseudoaneurysm or venous thrombosis but it showed progressive acute pancreatitis with increased inflammation around the pancreatic head and new involvement of the tail with an additional finding of a 2.1 cm hematoma in the pancreatic head  GI consultation was obtained.   Subjective: Patient was seen and examined this morning.   Lying in bed.  Not in distress.  Able to tolerate diet.  Assessment and plan: Acute hemorrhagic pancreatitis, alcohol-related  Initially admitted for acute pancreatitis with improvement, but has had both worsened again.  Repeat imaging showed large transformation. Imagings negative for cholelithiasis Treated with conservative management with supportive care, fluids, pain control, PPI. Lipase level improving.  Fluid has been stopped.  Diet has been advanced to soft diet Eliquis on hold Tentative plan of repeat CT scan this weekend, 1 week after the last CT. Recent Labs  Lab  04/17/23 0629  LIPASE 82*   Acute GI bleeding  Acute blood loss anemia On admission, patient mentioned intermittent hematochezia for months.  Last colonoscopy 6/24 multiple polyps were removed, diverticulosis During this hospitalization, on 9/27, patient had recurrence of hematochezia with drop in hemoglobin to lowest of 7.  Blood transfusion was given. CT angio without any obvious source.  Eliquis held GI was consulted. Hematochezia seems to have slowed down. No current plan of repeat colonoscopy Hemoglobin this morning down from last night but no active bleeding.  Continue monitor Recent Labs    12/23/22 1142 03/21/23 1450 04/13/23 0841 04/14/23 1250 04/17/23 0629 04/18/23 0305 04/19/23 0601 04/19/23 2012 04/21/23 0411  HGB 13.7   < >  --    < > 8.0* 7.7* 7.0* 9.3* 8.1*  MCV 98.7   < >  --    < > 93.1 92.9 91.8  --  92.5  VITAMINB12  --   --  211  --   --   --   --   --   --   FOLATE  --   --  20.7  --   --   --   --   --   --   FERRITIN 222.5  --  433*  --   --   --   --   --   --   TIBC 271.6  --  102*  --   --   --   --   --   --   IRON 50  --  12*  --   --   --   --   --   --   RETICCTPCT  --   --  3.7*  --   --   --   --   --   --    < > = values in this interval not displayed.   AAA with ulcer and mural plaque, thrombus Previous hospitalist discussed with vascular surgery Dr. Myra Gianotti last week.  Imaging reviewed, recommended blood pressure control, outpatient follow-up in few months BP remains stable    Paroxysmal atrial fibrillation Continue metoprolol. Eliquis on hold due to bleeding   Bilateral pedal edema Chronic systolic CHF Essential hypertension Received a lot of IV hydration because of pancreatitis.  Leading to generalized anasarca.  Has 1+ bilateral pedal edema.  Albumin level is low as well. Blood pressure is stable.  Metoprolol resumed today.  Losartan remains on hold.   Blood pressure in low normal range today.  If blood pressure improves, we can try  some Lasix tomorrow.  Encourage ambulation. Previous echo from April 2023 had shown EF of 20 to 25%.  Repeat echo this hospitalization showed significant improvement in EF to normal.     COPD Continue ICS/LABA   Severe hypoalbuminemia Albumin level less than 2 consistently.  Suspect chronic liver disease from EtOH abuse Dietitian consulted.  Ensure started    Mobility: Encourage ambulation  Goals of care   Code Status: Full Code     DVT prophylaxis: Place and maintain sequential compression device Start: 04/20/23 0920   Antimicrobials: None Fluid: Held because of edema Consultants: GI Family Communication: None at bedside  Level of care:  Med-Surg   Patient is from: Lives alone, lately in a hotel suite.  Does not have a home Needs to continue in-hospital care: Gradual recovery from pancreatitis.  Plan to repeat CT scan this weekend Anticipated d/c to: Hopefully by Monday if continues to improve      Diet:  Diet Order             Diet Heart Room service appropriate? Yes; Fluid consistency: Thin  Diet effective now                   Scheduled Meds:  feeding supplement  237 mL Oral TID BM   folic acid  1 mg Oral Daily   metoprolol tartrate  12.5 mg Oral BID   mometasone-formoterol  2 puff Inhalation BID   multivitamin with minerals   Oral Daily   pantoprazole  40 mg Oral BID   thiamine  100 mg Oral Daily    PRN meds: bisacodyl, hydrALAZINE, ipratropium-albuterol, lactulose, morphine injection, naphazoline-glycerin, ondansetron **OR** ondansetron (ZOFRAN) IV, senna-docusate   Infusions:    Antimicrobials: Anti-infectives (From admission, onward)    None       Objective: Vitals:   04/21/23 0903 04/21/23 1607  BP: 116/85 115/76  Pulse: 71 88  Resp: 17 17  Temp: 98.4 F (36.9 C) 98.2 F (36.8 C)  SpO2: 98% 98%    Intake/Output Summary (Last 24 hours) at 04/21/2023 1620 Last data filed at 04/21/2023 1400 Gross per 24 hour  Intake 940 ml   Output 1125 ml  Net -185 ml   Filed Weights   04/19/23 0500 04/20/23 0500 04/21/23 0522  Weight: 99.7 kg 104.7 kg 104.4 kg   Weight change: -0.3 kg Body mass index is 30.37 kg/m.   Physical Exam: General exam: Pleasant, elderly Caucasian male. Skin: No rashes, lesions or ulcers. HEENT: Atraumatic, normocephalic, no obvious bleeding Lungs: Clear to auscultation bilaterally CVS: Regular rate and rhythm, no murmur GI/Abd soft, abdominal tenderness improving, bowel sound present CNS:  Alert, awake, oriented x 3. Psychiatry: Mood appropriate Extremities: Improving bilateral pedal edema, no calf tenderness  Data Review: I have personally reviewed the laboratory data and studies available.  F/u labs ordered Unresulted Labs (From admission, onward)     Start     Ordered   04/21/23 0500  CBC with Differential/Platelet  Daily,   R     Question:  Specimen collection method  Answer:  Lab=Lab collect   04/20/23 1105   04/21/23 0500  Comprehensive metabolic panel  Daily,   R     Question:  Specimen collection method  Answer:  Lab=Lab collect   04/20/23 1105            Total time spent in review of labs and imaging, patient evaluation, formulation of plan, documentation and communication with family: 45 minutes  Signed, Lorin Glass, MD [facility] 04/21/2023

## 2023-04-22 ENCOUNTER — Inpatient Hospital Stay (HOSPITAL_COMMUNITY): Payer: 59

## 2023-04-22 DIAGNOSIS — K921 Melena: Secondary | ICD-10-CM | POA: Diagnosis not present

## 2023-04-22 DIAGNOSIS — K852 Alcohol induced acute pancreatitis without necrosis or infection: Secondary | ICD-10-CM | POA: Diagnosis not present

## 2023-04-22 DIAGNOSIS — D649 Anemia, unspecified: Secondary | ICD-10-CM | POA: Diagnosis not present

## 2023-04-22 DIAGNOSIS — K859 Acute pancreatitis without necrosis or infection, unspecified: Secondary | ICD-10-CM | POA: Diagnosis not present

## 2023-04-22 LAB — CBC WITH DIFFERENTIAL/PLATELET
Abs Immature Granulocytes: 0.04 10*3/uL (ref 0.00–0.07)
Basophils Absolute: 0 10*3/uL (ref 0.0–0.1)
Basophils Relative: 0 %
Eosinophils Absolute: 0.2 10*3/uL (ref 0.0–0.5)
Eosinophils Relative: 4 %
HCT: 24 % — ABNORMAL LOW (ref 39.0–52.0)
Hemoglobin: 7.8 g/dL — ABNORMAL LOW (ref 13.0–17.0)
Immature Granulocytes: 1 %
Lymphocytes Relative: 16 %
Lymphs Abs: 0.9 10*3/uL (ref 0.7–4.0)
MCH: 30.1 pg (ref 26.0–34.0)
MCHC: 32.5 g/dL (ref 30.0–36.0)
MCV: 92.7 fL (ref 80.0–100.0)
Monocytes Absolute: 0.4 10*3/uL (ref 0.1–1.0)
Monocytes Relative: 8 %
Neutro Abs: 4.2 10*3/uL (ref 1.7–7.7)
Neutrophils Relative %: 71 %
Platelets: 265 10*3/uL (ref 150–400)
RBC: 2.59 MIL/uL — ABNORMAL LOW (ref 4.22–5.81)
RDW: 15 % (ref 11.5–15.5)
WBC: 5.7 10*3/uL (ref 4.0–10.5)
nRBC: 0 % (ref 0.0–0.2)

## 2023-04-22 LAB — COMPREHENSIVE METABOLIC PANEL
ALT: 16 U/L (ref 0–44)
AST: 20 U/L (ref 15–41)
Albumin: 1.5 g/dL — ABNORMAL LOW (ref 3.5–5.0)
Alkaline Phosphatase: 89 U/L (ref 38–126)
Anion gap: 8 (ref 5–15)
BUN: 8 mg/dL (ref 8–23)
CO2: 24 mmol/L (ref 22–32)
Calcium: 7.8 mg/dL — ABNORMAL LOW (ref 8.9–10.3)
Chloride: 103 mmol/L (ref 98–111)
Creatinine, Ser: 0.76 mg/dL (ref 0.61–1.24)
GFR, Estimated: >60 mL/min (ref >60)
Glucose, Bld: 113 mg/dL — ABNORMAL HIGH (ref 70–99)
Potassium: 3.8 mmol/L (ref 3.5–5.1)
Sodium: 135 mmol/L (ref 135–145)
Total Bilirubin: <0.1 mg/dL — ABNORMAL LOW (ref 0.3–1.2)
Total Protein: 4.2 g/dL — ABNORMAL LOW (ref 6.5–8.1)

## 2023-04-22 MED ORDER — IOHEXOL 350 MG/ML SOLN
75.0000 mL | Freq: Once | INTRAVENOUS | Status: AC | PRN
  Administered 2023-04-22: 75 mL via INTRAVENOUS

## 2023-04-22 MED ORDER — FUROSEMIDE 10 MG/ML IJ SOLN
40.0000 mg | Freq: Once | INTRAMUSCULAR | Status: AC
  Administered 2023-04-22: 40 mg via INTRAVENOUS
  Filled 2023-04-22: qty 4

## 2023-04-22 NOTE — Progress Notes (Signed)
PROGRESS NOTE  Luis Marquez  DOB: [birth year]  PCP: Claiborne Rigg, NP WNU:[id number]  DOA: 04/06/2023  LOS: 16 days  Hospital Day: 17  Brief narrative: Luis Marquez is a 62 y.o. male with PMH significant for systolic CHF (EF now normalized), PAF on Eliquis, CAD, COPD, alcohol abuse with h/o DTs 9/9, patient presented to the ED with complaint of epigastric pain, intermittent hematochezia for months.  Initial workup with CT abdomen showed acute pancreatitis with developing pseudoaneurysm and pancreatic head.  It also showed a AAA with enhancing ulcer along with mural plaque and thrombus Lipase level was over 1000 Admitted to [facility]  Over the course of next several days, pancreatitis slowly improved.  Diet was advanced. 9/27, patient had  multiple episodes of hematochezia and worsening abdominal pain 9/28, hemoglobin down to 6.6, transfusion given CT angio abdomen/pelvis was obtained which did not show any evidence of active GI bleeding.  It did not show any evidence of pancreatic pseudoaneurysm or venous thrombosis but it showed progressive acute pancreatitis with increased inflammation around the pancreatic head and new involvement of the tail with an additional finding of a 2.1 cm hematoma in the pancreatic head GI consultation was obtained.   Subjective: Patient was seen and examined this morning.   Lying in bed.  Not in distress.  Able to tolerate diet. Pending CT abdomen today.  Assessment and plan: Acute hemorrhagic pancreatitis, alcohol-related  Initially admitted for acute pancreatitis with improvement, but has had both worsened again.  Repeat imaging showed large transformation. Imagings negative for cholelithiasis Treated with conservative management with supportive care, fluids, pain control, PPI. Lipase level improving.  Fluid has been stopped.  Diet has been advanced to soft diet Eliquis on hold Pending repeat CT abdomen today. Recent Labs  Lab 04/17/23 0629  LIPASE  82*   Acute GI bleeding  Acute blood loss anemia On admission, patient mentioned intermittent hematochezia for months.  Last colonoscopy 6/24 multiple polyps were removed, diverticulosis During this hospitalization, on 9/27, patient had recurrence of hematochezia with drop in hemoglobin to lowest of 7.  Blood transfusion was given. CT angio without any obvious source.  Eliquis held GI was consulted. Hematochezia seems to have slowed down. No current plan of repeat colonoscopy Hemoglobin trending down but no active bleeding.  Continue to monitor Recent Labs    12/23/22 1142 03/21/23 1450 04/13/23 0841 04/14/23 1250 04/18/23 0305 04/19/23 0601 04/19/23 2012 04/21/23 0411 04/22/23 0601  HGB 13.7   < >  --    < > 7.7* 7.0* 9.3* 8.1* 7.8*  MCV 98.7   < >  --    < > 92.9 91.8  --  92.5 92.7  VITAMINB12  --   --  211  --   --   --   --   --   --   FOLATE  --   --  20.7  --   --   --   --   --   --   FERRITIN 222.5  --  433*  --   --   --   --   --   --   TIBC 271.6  --  102*  --   --   --   --   --   --   IRON 50  --  12*  --   --   --   --   --   --   RETICCTPCT  --   --  3.7*  --   --   --   --   --   --    < > =  values in this interval not displayed.   AAA with ulcer and mural plaque, thrombus Previous hospitalist discussed with vascular surgery Dr. Myra Gianotti last week.  Imaging reviewed, recommended blood pressure control, outpatient follow-up in few months BP remains stable    Paroxysmal atrial fibrillation Continue metoprolol. Eliquis on hold due to bleeding   Bilateral pedal edema Chronic systolic CHF Essential hypertension Received a lot of IV hydration because of pancreatitis.  Leading to generalized anasarca.  Has 1+ bilateral pedal edema.  Albumin level is low as well. Blood pressure is stable on metoprolol.  Losartan remains on hold.   Given 1+ bilateral edema, I will give him 1 dose of Lasix IV today. Previous echo from April 2023 had shown EF of 20 to 25%.  Repeat  echo this hospitalization showed significant improvement in EF to normal.     COPD Continue ICS/LABA   Severe hypoalbuminemia Albumin level less than 2 consistently.  Suspect chronic liver disease from EtOH abuse Dietitian consulted.  Ensure started    Mobility: Encourage ambulation  Goals of care   Code Status: Full Code     DVT prophylaxis: Place and maintain sequential compression device Start: 04/20/23 0920   Antimicrobials: None Fluid: IV fluid on hold because of edema Consultants: GI Family Communication: None at bedside  Level of care:  Med-Surg   Patient is from: Lives alone, lately in a hotel suite.  Does not have a home Needs to continue in-hospital care: Gradual recovery from pancreatitis.  Pending CT abdomen today.   Anticipated d/c to: Hopefully home tomorrow if CT abdomen is normal and if hemoglobin is stable tomorrow    Diet:  Diet Order             Diet Heart Room service appropriate? Yes; Fluid consistency: Thin  Diet effective now                   Scheduled Meds:  feeding supplement  237 mL Oral TID BM   folic acid  1 mg Oral Daily   metoprolol tartrate  12.5 mg Oral BID   mometasone-formoterol  2 puff Inhalation BID   multivitamin with minerals   Oral Daily   pantoprazole  40 mg Oral BID   thiamine  100 mg Oral Daily    PRN meds: bisacodyl, hydrALAZINE, ipratropium-albuterol, lactulose, morphine injection, naphazoline-glycerin, ondansetron **OR** ondansetron (ZOFRAN) IV, senna-docusate   Infusions:    Antimicrobials: Anti-infectives (From admission, onward)    None       Objective: Vitals:   04/21/23 2016 04/22/23 0903  BP:  111/82  Pulse: 70 77  Resp: 16 20  Temp:  98.4 F (36.9 C)  SpO2: 98% 97%    Intake/Output Summary (Last 24 hours) at 04/22/2023 1148 Last data filed at 04/22/2023 0904 Gross per 24 hour  Intake 480 ml  Output 900 ml  Net -420 ml   Filed Weights   04/19/23 0500 04/20/23 0500 04/21/23 0522   Weight: 99.7 kg 104.7 kg 104.4 kg   Weight change:  Body mass index is 30.37 kg/m.   Physical Exam: General exam: Pleasant, elderly Caucasian male. Skin: No rashes, lesions or ulcers. HEENT: Atraumatic, normocephalic, no obvious bleeding Lungs: Clear to auscultation bilaterally CVS: Regular rate and rhythm, no murmur GI/Abd soft, abdominal tenderness improving, bowel sound present CNS: Alert, awake, oriented x 3. Psychiatry: Mood appropriate Extremities: Improving bilateral pedal edema, no calf tenderness  Data Review: I have personally reviewed the laboratory data and studies available.  F/u labs ordered Unresulted Labs (From admission, onward)     Start     Ordered   04/21/23 0500  CBC with Differential/Platelet  Daily,   R     Question:  Specimen collection method  Answer:  Lab=Lab collect   04/20/23 1105   04/21/23 0500  Comprehensive metabolic panel  Daily,   R     Question:  Specimen collection method  Answer:  Lab=Lab collect   04/20/23 1105            Total time spent in review of labs and imaging, patient evaluation, formulation of plan, documentation and communication with family: 45 minutes  Signed, Lorin Glass, MD [facility] 04/22/2023

## 2023-04-22 NOTE — Final Progress Note (Signed)
[facility] ROUNDING NOTE   Subjective: Patient reports feeling well today.  Tolerating regular diet.  No nausea or vomiting.  Abdominal pain well-controlled.  Had a bowel movement today which was normal in color.  Hemoglobin stable from yesterday (suspect hemoglobin 9.3 inaccurate). Repeat CT today showed stable to improved fluid collection and hematoma.   Objective: Vital signs in last 24 hours: Temp:  [98.1 F (36.7 C)-98.4 F (36.9 C)] 98.1 F (36.7 C) (10/05 1525) Pulse Rate:  [70-78] 78 (10/05 1525) Resp:  [16-20] 20 (10/05 1525) BP: (102-116)/(76-82) 102/76 (10/05 1525) SpO2:  [95 %-98 %] 95 % (10/05 1525) Last BM Date : 04/21/23 General: NAD, pleasant Caucasian male Lungs:  CTA b/l, no w/r/r Heart:  RRR, no m/r/g Abdomen:  Soft, mild tenderness to palpation in the epigastrium and left upper quadrant, ND, +BS Ext: Trace bilateral lower extremity edema    Intake/Output from previous day: 10/04 0701 - 10/05 0700 In: 700 [P.O.:700] Out: 850 [Urine:850] Intake/Output this shift: Total I/O In: -  Out: 1175 [Urine:1175]   Lab Results: Recent Labs    04/19/23 2012 04/21/23 0411 04/22/23 0601  WBC  --  6.1 5.7  HGB 9.3* 8.1* 7.8*  PLT  --  298 265  MCV  --  92.5 92.7   BMET Recent Labs    04/21/23 0411 04/22/23 0601  NA 134* 135  K 3.6 3.8  CL 103 103  CO2 24 24  GLUCOSE 102* 113*  BUN 8 8  CREATININE 0.72 0.76  CALCIUM 7.7* 7.8*   LFT Recent Labs    04/21/23 0411 04/22/23 0601  PROT 4.2* 4.2*  ALBUMIN 1.6* 1.5*  AST 20 20  ALT 16 16  ALKPHOS 87 89  BILITOT 0.4 <0.1*   PT/INR No results for input(s): "INR" in the last 72 hours.    Imaging/Other results: CT ABDOMEN PELVIS W CONTRAST  Result Date: 04/22/2023 CLINICAL DATA:  Follow-up of hemorrhagic pancreatitis. EXAM: CT ABDOMEN AND PELVIS WITH CONTRAST TECHNIQUE: Multidetector CT imaging of the abdomen and pelvis was performed using the standard protocol following bolus  administration of intravenous contrast. RADIATION DOSE REDUCTION: This exam was performed according to the departmental dose-optimization program which includes automated exposure control, adjustment of the mA and/or kV according to patient size and/or use of iterative reconstruction technique. CONTRAST:  75mL OMNIPAQUE IOHEXOL 350 MG/ML SOLN COMPARISON:  04/15/2023 FINDINGS: Lower chest: Clear lung bases. Trace left pleural fluid is new. Normal heart size. Hepatobiliary: Mild hepatic steatosis. Tiny dependent gallstone without acute cholecystitis or biliary duct dilatation. Pancreas: Moderate peripancreatic edema adjacent the body and tail, similar. Moderate to marked peripancreatic edema adjacent the head and descending duodenum, also similar. No pancreatic necrosis or pancreatic duct dilatation. The presumed hematoma positioned posterior and caudal to the uncinate process measures 2.4 x 2.3 cm on 33/3 today. 2.8 x 2.9 cm when remeasured in a similar fashion on 04/15/2023. The central area of significant central hypoattenuation is decreased. Ill-defined fluid lateral to the inferior genu of the duodenum including up to 4.7 cm on 33/3 is similar. Spleen: Normal in size, without focal abnormality. Adrenals/Urinary Tract: Bilateral adrenal thickening. Lower pole right renal 2.0 cm cyst. Interpolar left renal too small to characterize lesion is most likely a cyst . In the absence of clinically indicated signs/symptoms require(s) no independent follow-up. No hydronephrosis. Decompressed urinary bladder. Stomach/Bowel: The proximal stomach is underdistended but appears thick walled. The descending duodenum is mildly thick walled in the area of presumed peripancreatic edema, similar.  Otherwise normal small bowel. Extensive colonic diverticulosis. Normal terminal ileum and appendix. Vascular/Lymphatic: Aortic atherosclerosis. Infrarenal abdominal aortic dilatation at 3.8 x 3.5 cm, similar. Extensive wall thrombus within.  Patent portal and splenic veins. No abdominopelvic adenopathy. Reproductive: The prostate Other: No free intraperitoneal air. No significant free fluid. Small fat containing right inguinal hernia. Musculoskeletal: Right proximal femur fixation. 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 Moderate T12 wedge type compression deformity is unchanged. IMPRESSION: 1. Similar moderate pancreatitis. 2. Complex collection inferior and posterior to the pancreatic uncinate process is minimally decreased in size and decreased in attenuation today, favoring resolving hematoma. Ill-defined fluid adjacent the descending duodenum is unchanged. If the patient's symptoms persist, follow-up to exclude developing pseudocyst suggested 3. New small left pleural effusion 4. Possible proximal gastric wall thickening, accentuated by underdistention. Correlate with symptoms of gastritis 5. Cholelithiasis 6. Possible secondary duodenitis, similar. 7. Abdominal aortic aneurysm of 3.6 cm Recommend follow-up ultrasound every 3 years. (Ref.: J Vasc Surg. 2018; 67:2-77 and J Am Coll Radiol 2013;10(10):789-794.) 8. Incidental findings, including: Hepatic steatosis Aortic Atherosclerosis (ICD10-I70.0). Electronically Signed   By: Jeronimo Greaves M.D.   On: 04/22/2023 14:08      Assessment and Plan:  Luis Marquez is a 62 y.o. year old male with a history of  colon polyps, systolic CHF (EF now normalized), PAF on Eliquis, CAD, COPD, alcohol abuse with h/o DTs, AAA with ulcerative mural plaque/thrombus. Admitted 9/19 with abdominal pain, blood in stool and pancreatitis. GI saw in consult 9/28    Hemorrhagic acute Etoh pancreatitis (initially there was concern for pseudoaneurysm but not seen on follow up imaging).  Still requiring frequent opioids but pain is controlled Repeat CT showed stable to improved fluid collections and peripancreatic hematoma   Hematochezia, intermittent.  Had EGD and colonoscopy in May 2024 for hematochezia  but no cause found and presumed to be diverticular bleed.  CTA this admission on 04/17/23 negative for active bleeding. Patient had normal bowel movement today   Anemia in setting of intermittent hematochezia and also ? Some blood loss from hemorrhagic pancreatitis Hgb up appropriately from 7 to 8.1 after a unit of pRBCs yesterday  Hemoglobin stable (hemoglobin 9.3 unlikely to be accurate)   Afib, Eliquis on hold  With patient tolerating regular diet, stable hemoglobin and controlled pain, he should be ready for discharge soon  He will be at risk for pseudocyst formation.  If he has recurrent symptoms of abdominal pain, nausea/vomiting p.o. intolerance, recommend repeat CT.    Jenel Lucks, MD  04/22/2023, 6:09 PM [facility]

## 2023-04-22 NOTE — Plan of Care (Signed)
Pt had CT today, lasix IV 40, ambulating in room without difficulty.

## 2023-04-23 DIAGNOSIS — K852 Alcohol induced acute pancreatitis without necrosis or infection: Secondary | ICD-10-CM | POA: Diagnosis not present

## 2023-04-23 DIAGNOSIS — D649 Anemia, unspecified: Secondary | ICD-10-CM | POA: Diagnosis not present

## 2023-04-23 DIAGNOSIS — K859 Acute pancreatitis without necrosis or infection, unspecified: Secondary | ICD-10-CM | POA: Diagnosis not present

## 2023-04-23 DIAGNOSIS — K921 Melena: Secondary | ICD-10-CM | POA: Diagnosis not present

## 2023-04-23 LAB — CBC WITH DIFFERENTIAL/PLATELET
Abs Immature Granulocytes: 0.03 10*3/uL (ref 0.00–0.07)
Basophils Absolute: 0 10*3/uL (ref 0.0–0.1)
Basophils Relative: 0 %
Eosinophils Absolute: 0.3 10*3/uL (ref 0.0–0.5)
Eosinophils Relative: 5 %
HCT: 24.1 % — ABNORMAL LOW (ref 39.0–52.0)
Hemoglobin: 8.1 g/dL — ABNORMAL LOW (ref 13.0–17.0)
Immature Granulocytes: 1 %
Lymphocytes Relative: 16 %
Lymphs Abs: 0.9 10*3/uL (ref 0.7–4.0)
MCH: 30.9 pg (ref 26.0–34.0)
MCHC: 33.6 g/dL (ref 30.0–36.0)
MCV: 92 fL (ref 80.0–100.0)
Monocytes Absolute: 0.5 10*3/uL (ref 0.1–1.0)
Monocytes Relative: 8 %
Neutro Abs: 3.8 10*3/uL (ref 1.7–7.7)
Neutrophils Relative %: 70 %
Platelets: 261 10*3/uL (ref 150–400)
RBC: 2.62 MIL/uL — ABNORMAL LOW (ref 4.22–5.81)
RDW: 15 % (ref 11.5–15.5)
WBC: 5.5 10*3/uL (ref 4.0–10.5)
nRBC: 0 % (ref 0.0–0.2)

## 2023-04-23 LAB — COMPREHENSIVE METABOLIC PANEL
ALT: 16 U/L (ref 0–44)
AST: 19 U/L (ref 15–41)
Albumin: 1.8 g/dL — ABNORMAL LOW (ref 3.5–5.0)
Alkaline Phosphatase: 87 U/L (ref 38–126)
Anion gap: 7 (ref 5–15)
BUN: 10 mg/dL (ref 8–23)
CO2: 26 mmol/L (ref 22–32)
Calcium: 8.1 mg/dL — ABNORMAL LOW (ref 8.9–10.3)
Chloride: 101 mmol/L (ref 98–111)
Creatinine, Ser: 0.69 mg/dL (ref 0.61–1.24)
GFR, Estimated: >60 mL/min (ref >60)
Glucose, Bld: 104 mg/dL — ABNORMAL HIGH (ref 70–99)
Potassium: 3.9 mmol/L (ref 3.5–5.1)
Sodium: 134 mmol/L — ABNORMAL LOW (ref 135–145)
Total Bilirubin: 0.2 mg/dL — ABNORMAL LOW (ref 0.3–1.2)
Total Protein: 4.6 g/dL — ABNORMAL LOW (ref 6.5–8.1)

## 2023-04-23 LAB — GLUCOSE, CAPILLARY: Glucose-Capillary: 116 mg/dL — ABNORMAL HIGH (ref 70–99)

## 2023-04-23 MED ORDER — FUROSEMIDE 10 MG/ML IJ SOLN
40.0000 mg | Freq: Once | INTRAMUSCULAR | Status: AC
  Administered 2023-04-23: 40 mg via INTRAVENOUS
  Filled 2023-04-23: qty 4

## 2023-04-23 MED ORDER — POLYETHYLENE GLYCOL 3350 17 G PO PACK
17.0000 g | PACK | Freq: Every day | ORAL | Status: DC
  Filled 2023-04-23: qty 1

## 2023-04-23 NOTE — Plan of Care (Signed)
Pt on IV morphine every 3-4 hours for pain.

## 2023-04-23 NOTE — Progress Notes (Signed)
[facility] ROUNDING NOTE   Subjective: Patient feeling well this morning.  Has continued abdominal pain, but responsive medications.  Tolerating regular diet.  No nausea or vomiting.  Had 2 bowel movements yesterday, with small hard stools, nonbloody. CT scan showed stable findings from previous.  Some interval improvement in size of the complex fluid collection near the uncinate process, favoring resolving hematoma.   Objective: Vital signs in last 24 hours: Temp:  [97.9 F (36.6 C)-98.8 F (37.1 C)] 98.5 F (36.9 C) (10/06 0823) Pulse Rate:  [71-87] 74 (10/06 0823) Resp:  [15-20] 17 (10/06 0823) BP: (101-114)/(75-79) 114/76 (10/06 0823) SpO2:  [93 %-96 %] 96 % (10/06 0823) Weight:  [101.2 kg] 101.2 kg (10/06 0500) Last BM Date : 04/22/23 General: NAD, pleasant Caucasian male Lungs:  CTA b/l, no w/r/r Heart:  RRR, no m/r/g Abdomen:  Soft, mild tenderness to palpation in the epigastrium and left upper quadrant, ND, +BS Ext:  No c/c/e    Intake/Output from previous day: 10/05 0701 - 10/06 0700 In: 440 [P.O.:440] Out: 1575 [Urine:1575] Intake/Output this shift: No intake/output data recorded.   Lab Results: Recent Labs    04/21/23 0411 04/22/23 0601 04/23/23 0544  WBC 6.1 5.7 5.5  HGB 8.1* 7.8* 8.1*  PLT 298 265 261  MCV 92.5 92.7 92.0   BMET Recent Labs    04/21/23 0411 04/22/23 0601 04/23/23 0544  NA 134* 135 134*  K 3.6 3.8 3.9  CL 103 103 101  CO2 24 24 26   GLUCOSE 102* 113* 104*  BUN 8 8 10   CREATININE 0.72 0.76 0.69  CALCIUM 7.7* 7.8* 8.1*   LFT Recent Labs    04/21/23 0411 04/22/23 0601 04/23/23 0544  PROT 4.2* 4.2* 4.6*  ALBUMIN 1.6* 1.5* 1.8*  AST 20 20 19   ALT 16 16 16   ALKPHOS 87 89 87  BILITOT 0.4 <0.1* 0.2*   PT/INR No results for input(s): "INR" in the last 72 hours.    Imaging/Other results: CT ABDOMEN PELVIS W CONTRAST  Result Date: 04/22/2023 CLINICAL DATA:  Follow-up of hemorrhagic pancreatitis. EXAM: CT  ABDOMEN AND PELVIS WITH CONTRAST TECHNIQUE: Multidetector CT imaging of the abdomen and pelvis was performed using the standard protocol following bolus administration of intravenous contrast. RADIATION DOSE REDUCTION: This exam was performed according to the departmental dose-optimization program which includes automated exposure control, adjustment of the mA and/or kV according to patient size and/or use of iterative reconstruction technique. CONTRAST:  75mL OMNIPAQUE IOHEXOL 350 MG/ML SOLN COMPARISON:  04/15/2023 FINDINGS: Lower chest: Clear lung bases. Trace left pleural fluid is new. Normal heart size. Hepatobiliary: Mild hepatic steatosis. Tiny dependent gallstone without acute cholecystitis or biliary duct dilatation. Pancreas: Moderate peripancreatic edema adjacent the body and tail, similar. Moderate to marked peripancreatic edema adjacent the head and descending duodenum, also similar. No pancreatic necrosis or pancreatic duct dilatation. The presumed hematoma positioned posterior and caudal to the uncinate process measures 2.4 x 2.3 cm on 33/3 today. 2.8 x 2.9 cm when remeasured in a similar fashion on 04/15/2023. The central area of significant central hypoattenuation is decreased. Ill-defined fluid lateral to the inferior genu of the duodenum including up to 4.7 cm on 33/3 is similar. Spleen: Normal in size, without focal abnormality. Adrenals/Urinary Tract: Bilateral adrenal thickening. Lower pole right renal 2.0 cm cyst. Interpolar left renal too small to characterize lesion is most likely a cyst . In the absence of clinically indicated signs/symptoms require(s) no independent follow-up. No hydronephrosis. Decompressed urinary bladder. Stomach/Bowel: The  proximal stomach is underdistended but appears thick walled. The descending duodenum is mildly thick walled in the area of presumed peripancreatic edema, similar. Otherwise normal small bowel. Extensive colonic diverticulosis. Normal terminal ileum  and appendix. Vascular/Lymphatic: Aortic atherosclerosis. Infrarenal abdominal aortic dilatation at 3.8 x 3.5 cm, similar. Extensive wall thrombus within. Patent portal and splenic veins. No abdominopelvic adenopathy. Reproductive: The prostate Other: No free intraperitoneal air. No significant free fluid. Small fat containing right inguinal hernia. Musculoskeletal: Right proximal femur fixation. 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 Moderate T12 wedge type compression deformity is unchanged. IMPRESSION: 1. Similar moderate pancreatitis. 2. Complex collection inferior and posterior to the pancreatic uncinate process is minimally decreased in size and decreased in attenuation today, favoring resolving hematoma. Ill-defined fluid adjacent the descending duodenum is unchanged. If the patient's symptoms persist, follow-up to exclude developing pseudocyst suggested 3. New small left pleural effusion 4. Possible proximal gastric wall thickening, accentuated by underdistention. Correlate with symptoms of gastritis 5. Cholelithiasis 6. Possible secondary duodenitis, similar. 7. Abdominal aortic aneurysm of 3.6 cm Recommend follow-up ultrasound every 3 years. (Ref.: J Vasc Surg. 2018; 67:2-77 and J Am Coll Radiol 2013;10(10):789-794.) 8. Incidental findings, including: Hepatic steatosis Aortic Atherosclerosis (ICD10-I70.0). Electronically Signed   By: Jeronimo Greaves M.D.   On: 04/22/2023 14:08      Assessment and Plan:  Luis Marquez is a 62 y.o. year old male with a history of  colon polyps, systolic CHF (EF now normalized), PAF on Eliquis, CAD, COPD, alcohol abuse with h/o DTs, AAA with ulcerative mural plaque/thrombus. Admitted 9/19 with abdominal pain, blood in stool and pancreatitis. GI saw in consult 9/28    Hemorrhagic acute Etoh pancreatitis (initially there was concern for pseudoaneurysm but not seen on follow up imaging).  Pain is controlled, recommend transitioning to oral pain regimen Repeat  CT showed stable to improved fluid collections and peripancreatic hematoma We discussed symptoms that he may develop that may be related to pseudocyst development, with recommendations to repeat CT if he has persistent abdominal pain. Patient aware that alcohol cessation is imperative, and that ongoing alcohol use will increase his risk for recurrent acute pancreatitis as well as chronic pancreatitis.   Hematochezia, intermittent.  Had EGD and colonoscopy in May 2024 for hematochezia but no cause found and presumed to be diverticular bleed.  CTA this admission on 04/17/23 negative for active bleeding. Patient having normal bowel movements, nonbloody   Anemia in setting of intermittent hematochezia and also ? Some blood loss from hemorrhagic pancreatitis Hemoglobin stable (hemoglobin 9.3 unlikely to be accurate)   Afib, Eliquis on hold  Constipation/hard stools Will add MiraLAX   With patient tolerating regular diet, stable hemoglobin and controlled pain, he should be ready for discharge soon   He will be at risk for pseudocyst formation.  If he has recurrent symptoms of abdominal pain, nausea/vomiting p.o. intolerance, recommend repeat CT.  GI will sign off at this time.  Will arrange for outpatient follow-up with Dr. Lavon Paganini.      Jenel Lucks, MD  04/23/2023, 9:03 AM [facility]

## 2023-04-23 NOTE — Progress Notes (Signed)
PROGRESS NOTE  Luis Marquez  DOB: [birth year]  PCP: Claiborne Rigg, NP QMV:[id number]  DOA: 04/06/2023  LOS: 17 days  Hospital Day: 18  Brief narrative: Luis Marquez is a 62 y.o. male with PMH significant for systolic CHF (EF now normalized), PAF on Eliquis, CAD, COPD, alcohol abuse with h/o DTs 9/9, patient presented to the ED with complaint of epigastric pain, intermittent hematochezia for months.  Initial workup with CT abdomen showed acute pancreatitis with developing pseudoaneurysm and pancreatic head.  It also showed a AAA with enhancing ulcer along with mural plaque and thrombus Lipase level was over 1000 Admitted to [facility]  Over the course of next several days, pancreatitis slowly improved.  Diet was advanced. 9/27, patient had  multiple episodes of hematochezia and worsening abdominal pain 9/28, hemoglobin down to 6.6, transfusion given CT angio abdomen/pelvis was obtained which did not show any evidence of active GI bleeding.  It did not show any evidence of pancreatic pseudoaneurysm or venous thrombosis but it showed progressive acute pancreatitis with increased inflammation around the pancreatic head and new involvement of the tail with an additional finding of a 2.1 cm hematoma in the pancreatic head GI consultation was obtained.   Subjective: Patient was seen and examined this morning.   Lying on bed.  Not in distress but able to tolerate regular consistency diet. CT abdomen from yesterday reviewed.  Stable findings including resolving hematoma  Assessment and plan: Acute hemorrhagic pancreatitis, alcohol-related  Initially admitted for acute pancreatitis with improvement, but has had both worsened again.  Repeat imaging showed large transformation. Imagings negative for cholelithiasis Treated with conservative management with supportive care, fluids, pain control, PPI. Lipase level improved.  Adequately hydrated. Able to tolerate regular consistency diet.  Eliquis on  hold 10/5, repeat CT abdomen showed improvement in the size of hematoma.   Recent Labs  Lab 04/17/23 0629  LIPASE 82*   Acute GI bleeding  Acute blood loss anemia On admission, patient mentioned intermittent hematochezia for months.  Last colonoscopy 6/24 multiple polyps were removed, diverticulosis During this hospitalization, on 9/27, patient had recurrence of hematochezia with drop in hemoglobin to lowest of 7.  Blood transfusion was given. CT angio without any obvious source.  Eliquis held. GI was consulted. Hematochezia improved. No current plan of repeat colonoscopy Hemoglobin stable. Recent Labs    12/23/22 1142 03/21/23 1450 04/13/23 0841 04/14/23 1250 04/19/23 0601 04/19/23 2012 04/21/23 0411 04/22/23 0601 04/23/23 0544  HGB 13.7   < >  --    < > 7.0* 9.3* 8.1* 7.8* 8.1*  MCV 98.7   < >  --    < > 91.8  --  92.5 92.7 92.0  VITAMINB12  --   --  211  --   --   --   --   --   --   FOLATE  --   --  20.7  --   --   --   --   --   --   FERRITIN 222.5  --  433*  --   --   --   --   --   --   TIBC 271.6  --  102*  --   --   --   --   --   --   IRON 50  --  12*  --   --   --   --   --   --   RETICCTPCT  --   --  3.7*  --   --   --   --   --   --    < > =  values in this interval not displayed.   AAA with ulcer and mural plaque, thrombus Previous hospitalist discussed with vascular surgery Dr. Myra Gianotti last week.  Imaging reviewed, recommended blood pressure control, outpatient follow-up in few months BP remains stable    Paroxysmal atrial fibrillation Continue metoprolol. Eliquis on hold due to bleeding   Bilateral pedal edema Chronic systolic CHF Essential hypertension Received a lot of IV hydration because of pancreatitis.  Leading to generalized anasarca.  Has 1+ bilateral pedal edema.  Albumin level is low as well. Blood pressure is stable on metoprolol.  Losartan remains on hold.   Given 1+ bilateral edema, 1 dose of IV Lasix was given yesterday.  Remains  edematous.  I will give 1 more dose of IV Lasix today. Previous echo from April 2023 had shown EF of 20 to 25%.  Repeat echo this hospitalization showed significant improvement in EF to normal.     COPD Continue ICS/LABA   Severe hypoalbuminemia Albumin level less than 2 consistently.  Suspect chronic liver disease from EtOH abuse Dietitian consulted.  Ensure started    Mobility: Encourage ambulation  Goals of care   Code Status: Full Code     DVT prophylaxis: Place and maintain sequential compression device Start: 04/20/23 0920   Antimicrobials: None Fluid: IV fluid on hold because of edema Consultants: GI Family Communication: None at bedside  Level of care:  Med-Surg   Patient is from: Lives alone, lately in a hotel suite.  Does not have a home Needs to continue in-hospital care: Gradual recovery from pancreatitis.  Medically stable for discharge.  However patient states that he lives in a motel and the intake office is closed today and will open tomorrow morning only.  Plan to discharge tomorrow    Diet:  Diet Order             Diet Heart Room service appropriate? Yes; Fluid consistency: Thin  Diet effective now                   Scheduled Meds:  feeding supplement  237 mL Oral TID BM   folic acid  1 mg Oral Daily   metoprolol tartrate  12.5 mg Oral BID   mometasone-formoterol  2 puff Inhalation BID   multivitamin with minerals   Oral Daily   pantoprazole  40 mg Oral BID   polyethylene glycol  17 g Oral Daily   thiamine  100 mg Oral Daily    PRN meds: bisacodyl, hydrALAZINE, ipratropium-albuterol, lactulose, morphine injection, naphazoline-glycerin, ondansetron **OR** ondansetron (ZOFRAN) IV, senna-docusate   Infusions:    Antimicrobials: Anti-infectives (From admission, onward)    None       Objective: Vitals:   04/23/23 0353 04/23/23 0823  BP: 101/79 114/76  Pulse: 87 74  Resp: 15 17  Temp: 97.9 F (36.6 C) 98.5 F (36.9 C)  SpO2:  93% 96%    Intake/Output Summary (Last 24 hours) at 04/23/2023 1041 Last data filed at 04/23/2023 0900 Gross per 24 hour  Intake 680 ml  Output 1575 ml  Net -895 ml   Filed Weights   04/20/23 0500 04/21/23 0522 04/23/23 0500  Weight: 104.7 kg 104.4 kg 101.2 kg   Weight change:  Body mass index is 29.44 kg/m.   Physical Exam: General exam: Pleasant, elderly Caucasian male. Skin: No rashes, lesions or ulcers. HEENT: Atraumatic, normocephalic, no obvious bleeding Lungs: Clear to auscultation bilaterally CVS: Regular rate and rhythm, no murmur GI/Abd soft, abdominal tenderness much better,  bowel sound present CNS: Alert, awake, oriented x 3. Psychiatry: Mood appropriate Extremities: Improving bilateral pedal edema, no calf tenderness  Data Review: I have personally reviewed the laboratory data and studies available.  F/u labs ordered Unresulted Labs (From admission, onward)    None       Total time spent in review of labs and imaging, patient evaluation, formulation of plan, documentation and communication with family: 45 minutes  Signed, Lorin Glass, MD [facility] 04/23/2023

## 2023-04-24 ENCOUNTER — Other Ambulatory Visit: Payer: Self-pay

## 2023-04-24 DIAGNOSIS — K852 Alcohol induced acute pancreatitis without necrosis or infection: Secondary | ICD-10-CM | POA: Diagnosis not present

## 2023-04-24 MED ORDER — OXYCODONE-ACETAMINOPHEN 5-325 MG PO TABS
1.0000 | ORAL_TABLET | Freq: Four times a day (QID) | ORAL | Status: DC | PRN
  Administered 2023-04-24: 1 via ORAL
  Filled 2023-04-24: qty 1

## 2023-04-24 MED ORDER — ENSURE ENLIVE PO LIQD
237.0000 mL | Freq: Three times a day (TID) | ORAL | 12 refills | Status: AC
  Filled 2023-04-24: qty 237, 1d supply, fill #0

## 2023-04-24 MED ORDER — SENNOSIDES-DOCUSATE SODIUM 8.6-50 MG PO TABS
1.0000 | ORAL_TABLET | Freq: Two times a day (BID) | ORAL | 0 refills | Status: AC | PRN
  Filled 2023-04-24: qty 30, 15d supply, fill #0

## 2023-04-24 MED ORDER — OXYCODONE-ACETAMINOPHEN 5-325 MG PO TABS
1.0000 | ORAL_TABLET | Freq: Four times a day (QID) | ORAL | 0 refills | Status: AC | PRN
  Filled 2023-04-24: qty 20, 5d supply, fill #0

## 2023-04-24 NOTE — Discharge Summary (Signed)
Physician Discharge Summary  Luis Marquez ZOX:[id number] DOB: 11/16/1960 DOA: 04/06/2023  PCP: Claiborne Rigg, NP  Admit date: 04/06/2023 Discharge date: 04/24/2023  Admitted From: Home Discharge disposition: Home  Recommendations at discharge:  Avoid alcohol use Eliquis has been stopped because of hematoma in pancreas  Losartan has been stopped because of low blood pressure.   Brief narrative: Luis Marquez is a 62 y.o. male with PMH significant for systolic CHF (EF now normalized), PAF on Eliquis, CAD, COPD, alcohol abuse with h/o DTs 9/9, patient presented to the ED with complaint of epigastric pain, intermittent hematochezia for months.  Initial workup with CT abdomen showed acute pancreatitis with developing pseudoaneurysm and pancreatic head.  It also showed a AAA with enhancing ulcer along with mural plaque and thrombus Lipase level was over 1000 Admitted to [facility]  Over the course of next several days, pancreatitis slowly improved.  Diet was advanced. 9/27, patient had  multiple episodes of hematochezia and worsening abdominal pain 9/28, hemoglobin down to 6.6, transfusion given CT angio abdomen/pelvis was obtained which did not show any evidence of active GI bleeding.  It did not show any evidence of pancreatic pseudoaneurysm or venous thrombosis but it showed progressive acute pancreatitis with increased inflammation around the pancreatic head and new involvement of the tail with an additional finding of a 2.1 cm hematoma in the pancreatic head GI consultation was obtained.   Subjective: Patient was seen and examined this morning.   Lying on bed.  Not in distress but able to tolerate regular consistency diet. Started for discharge today.  Assessment and plan: Acute hemorrhagic pancreatitis, alcohol-related  Initially admitted for acute pancreatitis with improvement, but has had both worsened again.  Repeat imaging showed large transformation. Imagings negative for  cholelithiasis Treated with conservative management with supportive care, fluids, pain control, PPI. Lipase level improved.  Adequately hydrated. Able to tolerate regular consistency diet.   10/5, repeat CT abdomen showed improvement in the size of hematoma.   Eliquis on hold and will continue to remain on hold.  GI follow-up as outpatient.  Acute GI bleeding  Acute blood loss anemia On admission, patient mentioned intermittent hematochezia for months.  Last colonoscopy 6/24 multiple polyps were removed, diverticulosis During this hospitalization, on 9/27, patient had recurrence of hematochezia with drop in hemoglobin to lowest of 7.  Blood transfusion was given. CT angio without any obvious source.  Eliquis held. GI was consulted. Hematochezia improved. No current plan of repeat colonoscopy Hemoglobin stable. Recent Labs    12/23/22 1142 03/21/23 1450 04/13/23 0841 04/14/23 1250 04/19/23 0601 04/19/23 2012 04/21/23 0411 04/22/23 0601 04/23/23 0544  HGB 13.7   < >  --    < > 7.0* 9.3* 8.1* 7.8* 8.1*  MCV 98.7   < >  --    < > 91.8  --  92.5 92.7 92.0  VITAMINB12  --   --  211  --   --   --   --   --   --   FOLATE  --   --  20.7  --   --   --   --   --   --   FERRITIN 222.5  --  433*  --   --   --   --   --   --   TIBC 271.6  --  102*  --   --   --   --   --   --   IRON 50  --  12*  --   --   --   --   --   --   RETICCTPCT  --   --  3.7*  --   --   --   --   --   --    < > = values in this interval not displayed.   AAA with ulcer and mural plaque, thrombus Previous hospitalist discussed with vascular surgery Dr. Myra Gianotti last week.  Imaging reviewed, recommended blood pressure control, outpatient follow-up in few months BP remains stable    Paroxysmal atrial fibrillation Continue metoprolol. Eliquis on hold due to bleeding   Bilateral pedal edema Chronic systolic CHF Essential hypertension Received a lot of IV hydration because of pancreatitis.  Leading to generalized  anasarca.  Has 1+ bilateral pedal edema.  Albumin level is low as well. Blood pressure is stable on metoprolol.  Losartan remains on hold.  Given 1+ bilateral edema, 1 dose of IV Lasix was given on 10/5 and 10/6.  Given low blood pressure, I would not discharged on Lasix.  I would continue to hold losartan Previous echo from April 2023 had shown EF of 20 to 25%.  Repeat echo this hospitalization showed significant improvement in EF to normal.     COPD Continue ICS/LABA   Severe hypoalbuminemia Albumin level less than 2 consistently.  Suspect chronic liver disease from EtOH abuse Dietitian consulted.  Ensure started    Goals of care   Code Status: Full Code   Wounds:  - Wound / Incision (Open or Dehisced) 12/08/21 Laceration Head Posterior;Mid closed with staples x 3 (Active)  Date First Assessed/Time First Assessed: 12/08/21 0030   Wound Type: Laceration  Location: Head  Location Orientation: Posterior;Mid  Wound Description (Comments): closed with staples x 3  Present on Admission: (c) Yes    Assessments 12/08/2021 12:30 AM 12/10/2021  8:45 AM  Dressing Type None None  Site / Wound Assessment Clean;Dry Clean;Dry  Peri-wound Assessment Intact Intact  Closure Staples Staples  Drainage Amount None None     No associated orders.    Discharge Exam:   Vitals:   04/23/23 1949 04/24/23 0500 04/24/23 0803 04/24/23 0817  BP: 115/84  100/75   Pulse: 78  84   Resp: 16  17   Temp: 98.5 F (36.9 C)  98.2 F (36.8 C)   TempSrc: Oral  Oral   SpO2: 95%  95% 94%  Weight:  101 kg    Height:        Body mass index is 29.38 kg/m.   General exam: Pleasant, elderly Caucasian male. Skin: No rashes, lesions or ulcers. HEENT: Atraumatic, normocephalic, no obvious bleeding Lungs: Clear to auscultation bilaterally CVS: Regular rate and rhythm, no murmur GI/Abd soft, abdominal tenderness much better, bowel sound present CNS: Alert, awake, oriented x 3. Psychiatry: Mood  appropriate Extremities: Improving bilateral pedal edema, no calf tenderness  Follow ups:    Follow-up Information     Claiborne Rigg, NP Follow up.   Specialty: Nurse Practitioner Contact information: [address] [place] [address] [place] [address] [phone]                 Discharge Instructions:   Discharge Instructions     Call MD for:  difficulty breathing, headache or visual disturbances   Complete by: As directed    Call MD for:  extreme fatigue   Complete by: As directed    Call MD for:  hives   Complete by: As  directed    Call MD for:  persistant dizziness or light-headedness   Complete by: As directed    Call MD for:  persistant nausea and vomiting   Complete by: As directed    Call MD for:  severe uncontrolled pain   Complete by: As directed    Call MD for:  temperature >100.4   Complete by: As directed    Diet general   Complete by: As directed    Discharge instructions   Complete by: As directed    Recommendations at discharge:   Avoid alcohol use  Eliquis has been stopped because of hematoma in pancreas   Losartan has been stopped because of low blood pressure.  General discharge instructions: Follow with Primary MD Claiborne Rigg, NP in 7 days  Please request your PCP  to go over your hospital tests, procedures, radiology results at the follow up. Please get your medicines reviewed and adjusted.  Your PCP may decide to repeat certain labs or tests as needed. Do not drive, operate heavy machinery, perform activities at heights, swimming or participation in water activities or provide baby sitting services if your were admitted for syncope or siezures until you have seen by Primary MD or a Neurologist and advised to do so again. [place] Controlled Substance Reporting System database was reviewed. Do not drive, operate heavy machinery, perform activities at heights, swim, participate in water activities or provide baby-sitting  services while on medications for pain, sleep and mood until your outpatient physician has reevaluated you and advised to do so again.  You are strongly recommended to comply with the dose, frequency and duration of prescribed medications. Activity: As tolerated with Full fall precautions use walker/cane & assistance as needed Avoid using any recreational substances like cigarette, tobacco, alcohol, or non-prescribed drug. If you experience worsening of your admission symptoms, develop shortness of breath, life threatening emergency, suicidal or homicidal thoughts you must seek medical attention immediately by calling 911 or calling your MD immediately  if symptoms less severe. You must read complete instructions/literature along with all the possible adverse reactions/side effects for all the medicines you take and that have been prescribed to you. Take any new medicine only after you have completely understood and accepted all the possible adverse reactions/side effects.  Wear Seat belts while driving. You were cared for by a hospitalist during your hospital stay. If you have any questions about your discharge medications or the care you received while you were in the hospital after you are discharged, you can call the unit and ask to speak with the hospitalist or the covering physician. Once you are discharged, your primary care physician will handle any further medical issues. Please note that NO REFILLS for any discharge medications will be authorized once you are discharged, as it is imperative that you return to your primary care physician (or establish a relationship with a primary care physician if you do not have one).   Increase activity slowly   Complete by: As directed        Discharge Medications:   Allergies as of 04/24/2023   No Known Allergies      Medication List     STOP taking these medications    Eliquis 5 MG Tabs tablet Generic drug: apixaban   losartan 100 MG  tablet Commonly known as: COZAAR       TAKE these medications    albuterol 108 (90 Base) MCG/ACT inhaler Commonly known as: Ventolin HFA Inhale 2 puffs  into the lungs every 6 (six) hours as needed for wheezing or shortness of breath.   atorvastatin 40 MG tablet Commonly known as: LIPITOR Take 1 tablet (40 mg total) by mouth daily.   B-Complex Balanced Tabs Take 1 tablet by mouth daily.   budesonide-formoterol 160-4.5 MCG/ACT inhaler Commonly known as: SYMBICORT Inhale 2 puffs into the lungs 2 (two) times daily.   feeding supplement Liqd Take 237 mLs by mouth 3 (three) times daily between meals.   loperamide 2 MG tablet Commonly known as: Imodium A-D Take 1 tablet (2 mg total) by mouth 4 (four) times daily as needed for diarrhea or loose stools.   metoprolol succinate 25 MG 24 hr tablet Commonly known as: TOPROL-XL Take 1 tablet (25 mg total) by mouth daily.   multivitamin tablet Take 1 tablet by mouth daily.   Omega-3 Fish Oil 1200 MG Caps Take 1 capsule by mouth daily.   OSTEO BI-FLEX TRIPLE STRENGTH PO Take 1 mg by mouth 2 (two) times daily.   oxyCODONE-acetaminophen 5-325 MG tablet Commonly known as: PERCOCET/ROXICET Take 1 tablet by mouth every 6 (six) hours as needed for up to 5 days for severe pain or moderate pain.   pantoprazole 40 MG tablet Commonly known as: PROTONIX Take 1 tablet (40 mg total) by mouth 2 (two) times daily.   potassium chloride SA 20 MEQ tablet Commonly known as: KLOR-CON M Take 2 tablets (40 mEq total) by mouth daily at 12 noon for 1 day, THEN 1 tablet (20 mEq total) daily at 12 noon. Start taking on: October 10, 2022   senna-docusate 8.6-50 MG tablet Commonly known as: Senokot-S Take 1 tablet by mouth 2 (two) times daily as needed for moderate constipation.   tetrahydrozoline 0.05 % ophthalmic solution Place 2 drops into both eyes daily.         The results of significant diagnostics from this hospitalization (including  imaging, microbiology, ancillary and laboratory) are listed below for reference.    Procedures and Diagnostic Studies:   US ABDOMEN LIMITED RUQ (LIVER/GB)  Result Date: 04/06/2023 CLINICAL DATA:  Pancreatitis EXAM: ULTRASOUND ABDOMEN LIMITED RIGHT UPPER QUADRANT COMPARISON:  CT abdomen and pelvis dated April 06, 2019 FINDINGS: Gallbladder: No gallstones. No gallbladder wall thickening. No sonographic Murphy sign noted by sonographer. Echogenic linear opacity located near gallbladder neck is due to redundant cystic duct when compared with same day CT. Common bile duct: Diameter: 6.5 mm Liver: No focal lesion identified. Increased parenchymal echogenicity. Portal vein is patent on color Doppler imaging with normal direction of blood flow towards the liver. Other: Trace ascites. IMPRESSION: 1. Normal sonographic appearance of the gallbladder. 2. Mildly dilated common bile duct. 3. Hepatic steatosis. 4. Trace ascites. Electronically Signed   By: Allegra Lai M.D.   On: 04/06/2023 19:36   CT ABDOMEN PELVIS W CONTRAST  Result Date: 04/06/2023 CLINICAL DATA:  Emesis and diarrhea.  Bloody stool. EXAM: CT ABDOMEN AND PELVIS WITH CONTRAST TECHNIQUE: Multidetector CT imaging of the abdomen and pelvis was performed using the standard protocol following bolus administration of intravenous contrast. RADIATION DOSE REDUCTION: This exam was performed according to the departmental dose-optimization program which includes automated exposure control, adjustment of the mA and/or kV according to patient size and/or use of iterative reconstruction technique. CONTRAST:  75mL OMNIPAQUE IOHEXOL 350 MG/ML SOLN COMPARISON:  12/09/2021 CT.  X-ray 11/26/2022 FINDINGS: Lower chest: Lung bases are clear. No pleural effusion. Small hiatal hernia. There is a also a small amount of fluid adjacent to the  hernia sac. Hepatobiliary: Fatty liver infiltration. No space-occupying liver lesion. Patent portal vein. Gallbladder is dilated.  Pancreas: There is peripancreatic fat stranding. There is some areas of fluid towards the pancreatic head in the pancreatic groove with the duodenal. Please correlate for clinical evidence of pancreatitis. Grossly preserved pancreatic enhancement. However there is a area of presumed hyperenhancement along the posterior aspect of the pancreatic head/uncinate measuring 8 mm on series 3, image 30 which does not persistent same density on delayed and has a density of vasculature worrisome for a developing pseudoaneurysm. Small amount of surrounding fluid or thrombus suggested as well on series 3, image 30. Spleen: Normal in size without focal abnormality. Adrenals/Urinary Tract: Diffuse mild thickening of the adrenal glands is stable, nonspecific. Mild parenchymal atrophy. Bosniak 1 and 2 renal cysts are stable. No imaging follow-up. The ureters have normal course and caliber extending down to the bladder. Preserved contours of the urinary bladder. Stomach/Bowel: On this non oral contrast exam large bowel has a normal course and caliber with scattered colonic diverticula. The stomach and small bowel are nondilated. Normal appendix. Vascular/Lymphatic: Diffuse vascular calcifications. Normal caliber IVC. There is no specific abnormal lymph node enlargement identified in the abdomen and pelvis. Once again there aneurysm formation of the abdominal aorta measuring up to 3.3 cm previously and today 3.7 cm when measured in a similar fashion. There appears to be a component of the penetrating ulcer into the plaque and thrombus anterolateral on axial series 3, image 35. Additional focus more caudal as well on image 42. Please see coronal series 6, image 72. This has more caudal focus appears new from the prior CT scan. Appearance Reproductive: Prostate is unremarkable. Other: Small fat containing right inguinal hernia. Trace ascites in the right upper quadrant near the liver. No free intra-air. Musculoskeletal: Osteopenia with  degenerative changes. Streak artifact related to the patient's dynamic right hip screw at the edge of the imaging field. There also pars defects at L5 with a at most trace listhesis. Stable mild compression of T12 IMPRESSION: Peripancreatic fat stranding identified including significant stranding towards the pancreatic groove. Please correlate for clinical evidence of pancreatitis. No well-defined fluid collections. However there is a focal 8 mm area of hyperenhancement posterior to the pancreatic head and uncinate worrisome for developing pseudoaneurysm. Recommend further evaluation. Fatty liver infiltration. Dilated gallbladder. Follow up ultrasound is recommended when appropriate to assess for stones. Trace ascites. Increasing size abdominal aortic aneurysm now measuring up to 3.7 cm. There is also increasing changes of a penetrating enhancing ulcer along the mural plaque and thrombus along the lower abdominal aorta. Recommend vascular surgery consultation when appropriate. Colonic diverticula. Critical Value/emergent results were called by telephone at the time of interpretation on 04/06/2023 at 12:32 pm to provider Pricilla Loveless , who verbally acknowledged these results. Electronically Signed   By: Karen Kays M.D.   On: 04/06/2023 15:38   DG Chest Portable 1 View  Result Date: 04/06/2023 CLINICAL DATA:  Dyspnea.  Abdominal pain. EXAM: PORTABLE CHEST 1 VIEW COMPARISON:  02/21/2022. FINDINGS: Bilateral lung fields are clear. Bilateral costophrenic angles are clear. Normal cardio-mediastinal silhouette. No acute osseous abnormalities. The soft tissues are within normal limits. No free air under the domes of diaphragm. IMPRESSION: 1. No active disease. Electronically Signed   By: Jules Schick M.D.   On: 04/06/2023 14:02     Labs:   Basic Metabolic Panel: Recent Labs  Lab 04/18/23 0305 04/19/23 0601 04/21/23 0411 04/22/23 0601 04/23/23 0544  NA 137  135 134* 135 134*  K 3.2* 3.9 3.6 3.8 3.9   CL 107 108 103 103 101  CO2 22 20* 24 24 26   GLUCOSE 113* 105* 102* 113* 104*  BUN 6* 5* 8 8 10   CREATININE 0.82 0.70 0.72 0.76 0.69  CALCIUM 7.4* 7.3* 7.7* 7.8* 8.1*   GFR Estimated Creatinine Clearance: 116.5 mL/min (by C-G formula based on SCr of 0.69 mg/dL). Liver Function Tests: Recent Labs  Lab 04/18/23 0305 04/21/23 0411 04/22/23 0601 04/23/23 0544  AST 23 20 20 19   ALT 19 16 16 16   ALKPHOS 97 87 89 87  BILITOT 0.4 0.4 <0.1* 0.2*  PROT 4.5* 4.2* 4.2* 4.6*  ALBUMIN 1.8* 1.6* 1.5* 1.8*   No results for input(s): "LIPASE", "AMYLASE" in the last 168 hours. No results for input(s): "AMMONIA" in the last 168 hours. Coagulation profile No results for input(s): "INR", "PROTIME" in the last 168 hours.  CBC: Recent Labs  Lab 04/18/23 0305 04/19/23 0601 04/19/23 2012 04/21/23 0411 04/22/23 0601 04/23/23 0544  WBC 8.2 6.6  --  6.1 5.7 5.5  NEUTROABS  --   --   --  4.4 4.2 3.8  HGB 7.7* 7.0* 9.3* 8.1* 7.8* 8.1*  HCT 23.6* 21.3* 28.0* 24.7* 24.0* 24.1*  MCV 92.9 91.8  --  92.5 92.7 92.0  PLT 323 303  --  298 265 261   Cardiac Enzymes: No results for input(s): "CKTOTAL", "CKMB", "CKMBINDEX", "TROPONINI" in the last 168 hours. BNP: Invalid input(s): "POCBNP" CBG: Recent Labs  Lab 04/23/23 0825  GLUCAP 116*   D-Dimer No results for input(s): "DDIMER" in the last 72 hours. Hgb A1c No results for input(s): "HGBA1C" in the last 72 hours. Lipid Profile No results for input(s): "CHOL", "HDL", "LDLCALC", "TRIG", "CHOLHDL", "LDLDIRECT" in the last 72 hours. Thyroid function studies No results for input(s): "TSH", "T4TOTAL", "T3FREE", "THYROIDAB" in the last 72 hours.  Invalid input(s): "FREET3" Anemia work up No results for input(s): "VITAMINB12", "FOLATE", "FERRITIN", "TIBC", "IRON", "RETICCTPCT" in the last 72 hours. Microbiology No results found for this or any previous visit (from the past 240 hour(s)).  Time coordinating discharge: 45  minutes  Signed: Emarion Toral  [facility] 04/24/2023, 2:22 PM

## 2023-04-25 ENCOUNTER — Telehealth: Payer: Self-pay

## 2023-04-25 NOTE — Transitions of Care (Post Inpatient/ED Visit) (Signed)
   04/25/2023  Name: Luis Marquez MRN: [id number] DOB: 1960-07-20  Today's TOC FU Call Status: Today's TOC FU Call Status:: Unsuccessful Call (1st Attempt) Unsuccessful Call (1st Attempt) Date: 04/25/23  Attempted to reach the patient regarding the most recent Inpatient/ED visit.  Follow Up Plan: Additional outreach attempts will be made to reach the patient to complete the Transitions of Care (Post Inpatient/ED visit) call.     Antionette Fairy, RN,BSN,CCM RN Care Manager Transitions of Care  [facility]  Direct Phone: [phone] Toll Free: [phone] Fax: [phone]

## 2023-04-26 ENCOUNTER — Telehealth: Payer: Self-pay

## 2023-04-26 NOTE — Transitions of Care (Post Inpatient/ED Visit) (Signed)
04/26/2023  Name: Luis Marquez MRN: [id number] DOB: May 28, 1961  Today's TOC FU Call Status: Today's TOC FU Call Status:: Successful TOC FU Call Completed TOC FU Call Complete Date: 04/26/23 (Voicemail message received from pt-return call to pt) Patient's Name and Date of Birth confirmed.  Transition Care Management Follow-up Telephone Call Date of Discharge: 04/25/23 Discharge Facility: [facility]) Type of Discharge: Inpatient Admission Primary Inpatient Discharge Diagnosis:: "acute pancreatitis, unspecified" How have you been since you were released from the hospital?: Better (pt voices he is doing better-"pain easing up"-current pain 4/10-has not had to takeany pain meds today yet-appetite good-eating 66meals/day which is normal for him, LBM 2 days ago-taking Senna, ambulating and getting around good) Any questions or concerns?: No  Items Reviewed: Did you receive and understand the discharge instructions provided?: Yes Medications obtained,verified, and reconciled?: Yes (Medications Reviewed) Any new allergies since your discharge?: No Dietary orders reviewed?: Yes Type of Diet Ordered:: low salt/heart healthy Do you have support at home?: No  Medications Reviewed Today: Medications Reviewed Today     Reviewed by Charlyn Minerva, RN (Registered Nurse) on 04/26/23 at 1119  Med List Status: <None>   Medication Order Taking? Sig Documenting Provider Last Dose Status Informant  albuterol (VENTOLIN HFA) 108 (90 Base) MCG/ACT inhaler [id number] Yes Inhale 2 puffs into the lungs every 6 (six) hours as needed for wheezing or shortness of breath. Claiborne Rigg, NP Taking Active Self  atorvastatin (LIPITOR) 40 MG tablet [id number] Yes Take 1 tablet (40 mg total) by mouth daily. Claiborne Rigg, NP Taking Active Self  B Complex-C-Folic Acid (B-COMPLEX BALANCED) TABS [id number] Yes Take 1 tablet by mouth daily. [provider] Taking Active Self   budesonide-formoterol (SYMBICORT) 160-4.5 MCG/ACT inhaler [id number] Yes Inhale 2 puffs into the lungs 2 (two) times daily. Hoy Register, MD Taking Active Self  feeding supplement (ENSURE ENLIVE / ENSURE PLUS) LIQD [id number]  Take 237 mLs by mouth 3 (three) times daily between meals. Lorin Glass, MD  Active   loperamide (IMODIUM A-D) 2 MG tablet [id number] Yes Take 1 tablet (2 mg total) by mouth 4 (four) times daily as needed for diarrhea or loose stools. Claiborne Rigg, NP Taking Active Self  metoprolol succinate (TOPROL-XL) 25 MG 24 hr tablet [id number] Yes Take 1 tablet (25 mg total) by mouth daily. Claiborne Rigg, NP Taking Active Self  Misc Natural Products (OSTEO BI-FLEX TRIPLE STRENGTH PO) [id number] Yes Take 1 mg by mouth 2 (two) times daily. [provider] Taking Active Self  Multiple Vitamin (MULTIVITAMIN) tablet [id number] Yes Take 1 tablet by mouth daily. [provider] Taking Active Self  Omega-3 Fatty Acids (OMEGA-3 FISH OIL) 1200 MG CAPS [id number] Yes Take 1 capsule by mouth daily. [provider] Taking Active Self  oxyCODONE-acetaminophen (PERCOCET/ROXICET) 5-325 MG tablet [id number] Yes Take 1 tablet by mouth every 6 (six) hours as needed for up to 5 days for severe pain or moderate pain. Lorin Glass, MD Taking Active   pantoprazole (PROTONIX) 40 MG tablet [id number] Yes Take 1 tablet (40 mg total) by mouth 2 (two) times daily. Claiborne Rigg, NP Taking Active Self  potassium chloride SA (KLOR-CON M) 20 MEQ tablet [id number] Yes Take 2 tablets (40 mEq total) by mouth daily at 12 noon for 1 day, THEN 1 tablet (20 mEq total) daily at 12 noon. [place], Anderson Malta, FNP Taking Active Self  senna-docusate (SENOKOT-S) 8.6-50 MG tablet [id number] Yes Take 1 tablet by mouth 2 (two) times daily  as needed for moderate constipation. Lorin Glass, MD Taking Active   tetrahydrozoline 0.05 % ophthalmic solution [id number] Yes Place 2 drops into both eyes daily.  [provider] Taking Active Self  Med List Note Darryl Nestle, CPhT 02/22/22 1138): Non-compliant with medications            Home Care and Equipment/Supplies: Were Home Health Services Ordered?: NA Any new equipment or medical supplies ordered?: NA  Functional Questionnaire: Do you need assistance with bathing/showering or dressing?: No Do you need assistance with meal preparation?: No Do you need assistance with eating?: No Do you have difficulty maintaining continence: No Do you need assistance with getting out of bed/getting out of a chair/moving?: No Do you have difficulty managing or taking your medications?: No  Follow up appointments reviewed: PCP Follow-up appointment confirmed?: Yes Date of PCP follow-up appointment?: 05/23/23 (care guide attempted to make sooner appt with provider but no openings-pt did not want to see another provider-was placed on wait/cancellation list, pt will also call and f/u with office) Follow-up Provider: [facility] Follow-up appointment confirmed?: NA Do you need transportation to your follow-up appointment?: No (pt confirms he is able to drive himself to appts) Do you understand care options if your condition(s) worsen?: Yes-patient verbalized understanding  SDOH Interventions Today    Flowsheet Row Most Recent Value  SDOH Interventions   Food Insecurity Interventions Intervention Not Indicated  Transportation Interventions Intervention Not Indicated       TOC Interventions Today    Flowsheet Row Most Recent Value  TOC Interventions   TOC Interventions Discussed/Reviewed TOC Interventions Discussed      Interventions Today    Flowsheet Row Most Recent Value  General Interventions   General Interventions Discussed/Reviewed General Interventions Discussed, Doctor Visits, Referral to Nurse  [pt declined need for ongoing calls-he is aware that he can call back for any needs or concerns]   Doctor Visits Discussed/Reviewed Doctor Visits Discussed, PCP  PCP/Specialist Visits Compliance with follow-up visit  Education Interventions   Education Provided Provided Education  Provided Verbal Education On Nutrition, Medication, When to see the doctor, Other  [pain mgmt, bowel mgmt]  Nutrition Interventions   Nutrition Discussed/Reviewed Nutrition Discussed  Pharmacy Interventions   Pharmacy Dicussed/Reviewed Pharmacy Topics Discussed, Medications and their functions  Safety Interventions   Safety Discussed/Reviewed Safety Discussed        Antionette Fairy, RN,BSN,CCM RN Care Manager Transitions of Care  [place]  Direct Phone: [phone] Toll Free: [phone] Fax: [phone]

## 2023-04-26 NOTE — Transitions of Care (Post Inpatient/ED Visit) (Signed)
   04/26/2023  Name: Luis Marquez MRN: [id number] DOB: Dec 09, 1960  Today's TOC FU Call Status: Today's TOC FU Call Status:: Unsuccessful Call (2nd Attempt) Unsuccessful Call (2nd Attempt) Date: 04/26/23  Attempted to reach the patient regarding the most recent Inpatient/ED visit.  Follow Up Plan: Additional outreach attempts will be made to reach the patient to complete the Transitions of Care (Post Inpatient/ED visit) call.      Antionette Fairy, RN,BSN,CCM RN Care Manager Transitions of Care  [place]  Direct Phone: [phone] Toll Free: [phone] Fax: [phone]

## 2023-05-19 ENCOUNTER — Telehealth (INDEPENDENT_AMBULATORY_CARE_PROVIDER_SITE_OTHER): Payer: Self-pay

## 2023-05-19 NOTE — Telephone Encounter (Signed)
Copied from CRM [id number]. Topic: General - Inquiry >> May 18, 2023  2:47 PM Clide Dales wrote: Patient called to verify that his pcp was still accepting his insurance. Patient said he received a notice in the mail from his insurance that she was no longer in Network with [employer]. Please advise.

## 2023-05-22 NOTE — Telephone Encounter (Signed)
Pt called back checking status of what he was told about Dr Meredeth Ide not taking [employer] and he was wondering if he can still have his appt okn 11/05 still covered under [employer]

## 2023-05-22 NOTE — Telephone Encounter (Signed)
[facility] does accept oscar ins.

## 2023-05-23 ENCOUNTER — Ambulatory Visit: Payer: 59 | Admitting: Nurse Practitioner

## 2023-05-25 ENCOUNTER — Other Ambulatory Visit: Payer: Self-pay

## 2023-05-25 ENCOUNTER — Other Ambulatory Visit: Payer: Self-pay | Admitting: Family Medicine

## 2023-05-26 ENCOUNTER — Other Ambulatory Visit: Payer: Self-pay

## 2023-05-26 MED ORDER — BUDESONIDE-FORMOTEROL FUMARATE 160-4.5 MCG/ACT IN AERO
2.0000 | INHALATION_SPRAY | Freq: Two times a day (BID) | RESPIRATORY_TRACT | 3 refills | Status: DC
  Filled 2023-05-26: qty 10.2, 30d supply, fill #0
  Filled 2023-07-28 – 2023-09-11 (×2): qty 10.2, 30d supply, fill #1

## 2023-05-26 NOTE — Telephone Encounter (Signed)
Requested Prescriptions  Pending Prescriptions Disp Refills   budesonide-formoterol (SYMBICORT) 160-4.5 MCG/ACT inhaler 10.2 g 3    Sig: Inhale 2 puffs into the lungs 2 (two) times daily.     Pulmonology:  Combination Products Passed - 05/25/2023  4:50 PM      Passed - Valid encounter within last 12 months    Recent Outpatient Visits           2 months ago Primary hypertension   [place] [facility] [place]. [facility] Claiborne Rigg, NP   5 months ago Hospital discharge follow-up   [facility] [place] - [facility] Dept Of [place]. [facility] Claiborne Rigg, NP   6 months ago Primary hypertension   [place] [facility] [place] [facility] Of [place]. [facility] Claiborne Rigg, NP   9 months ago Essential hypertension   [place] [facility] [place] [facility] Dept Of [place]. [facility] Claiborne Rigg, NP   1 year ago Vasovagal syncope   [place] [facility] [place] [facility] Of [place]. [facility] Storm Frisk, MD       Future Appointments             In 3 days Terri Piedra, DO [facility]

## 2023-05-29 ENCOUNTER — Ambulatory Visit: Payer: 59 | Admitting: Dermatology

## 2023-06-07 ENCOUNTER — Other Ambulatory Visit: Payer: Self-pay

## 2023-06-08 ENCOUNTER — Telehealth: Payer: Self-pay

## 2023-06-08 NOTE — Telephone Encounter (Signed)
Called the patient to help arrange a hospital follow up appointment for pancreatitis. No answer. Left a message of my call on his voicemail.

## 2023-06-26 ENCOUNTER — Other Ambulatory Visit: Payer: Self-pay | Admitting: Nurse Practitioner

## 2023-06-26 ENCOUNTER — Other Ambulatory Visit: Payer: Self-pay

## 2023-06-26 DIAGNOSIS — K219 Gastro-esophageal reflux disease without esophagitis: Secondary | ICD-10-CM

## 2023-06-26 MED ORDER — PANTOPRAZOLE SODIUM 40 MG PO TBEC
40.0000 mg | DELAYED_RELEASE_TABLET | Freq: Two times a day (BID) | ORAL | 0 refills | Status: DC
Start: 2023-06-26 — End: 2023-12-04
  Filled 2023-06-26 (×2): qty 60, 30d supply, fill #0
  Filled 2023-07-28: qty 60, 30d supply, fill #1
  Filled 2023-09-11 – 2023-09-25 (×2): qty 60, 30d supply, fill #2

## 2023-06-26 NOTE — Telephone Encounter (Signed)
Letter mailed to the patient

## 2023-06-28 ENCOUNTER — Other Ambulatory Visit: Payer: Self-pay

## 2023-07-04 ENCOUNTER — Other Ambulatory Visit: Payer: Self-pay | Admitting: *Deleted

## 2023-07-04 DIAGNOSIS — I700 Atherosclerosis of aorta: Secondary | ICD-10-CM

## 2023-07-06 ENCOUNTER — Encounter: Payer: 59 | Admitting: Vascular Surgery

## 2023-07-06 ENCOUNTER — Other Ambulatory Visit (HOSPITAL_COMMUNITY): Payer: 59

## 2023-07-13 ENCOUNTER — Telehealth: Payer: Self-pay | Admitting: Gastroenterology

## 2023-07-13 NOTE — Telephone Encounter (Signed)
Inbound call from patient requesting a call back to discuss hospital follow up. Please advise, thank you.

## 2023-07-14 NOTE — Telephone Encounter (Signed)
Spoke with the patient. He agrees to schedule follow up appointment. He asks that the appointment be after February. He will have Medicare and transportation then.  10/04/23 at 2:30 pm. Appointment card mailed.

## 2023-07-20 ENCOUNTER — Encounter: Payer: 59 | Admitting: Vascular Surgery

## 2023-07-20 ENCOUNTER — Ambulatory Visit (HOSPITAL_COMMUNITY): Payer: 59

## 2023-07-28 ENCOUNTER — Other Ambulatory Visit: Payer: Self-pay

## 2023-07-28 ENCOUNTER — Other Ambulatory Visit: Payer: Self-pay | Admitting: Nurse Practitioner

## 2023-07-28 DIAGNOSIS — I10 Essential (primary) hypertension: Secondary | ICD-10-CM

## 2023-07-31 ENCOUNTER — Other Ambulatory Visit: Payer: Self-pay

## 2023-08-01 ENCOUNTER — Other Ambulatory Visit: Payer: Self-pay

## 2023-08-02 ENCOUNTER — Other Ambulatory Visit: Payer: Self-pay

## 2023-09-11 ENCOUNTER — Other Ambulatory Visit: Payer: Self-pay | Admitting: Pharmacist

## 2023-09-11 ENCOUNTER — Other Ambulatory Visit: Payer: Self-pay

## 2023-09-11 MED ORDER — FLUTICASONE-SALMETEROL 250-50 MCG/ACT IN AEPB
1.0000 | INHALATION_SPRAY | Freq: Two times a day (BID) | RESPIRATORY_TRACT | 3 refills | Status: DC
  Filled 2023-09-11 – 2023-09-25 (×2): qty 60, 30d supply, fill #0

## 2023-09-21 ENCOUNTER — Other Ambulatory Visit: Payer: Self-pay

## 2023-09-25 ENCOUNTER — Other Ambulatory Visit: Payer: Self-pay

## 2023-09-26 ENCOUNTER — Other Ambulatory Visit: Payer: Self-pay

## 2023-10-04 ENCOUNTER — Ambulatory Visit: Payer: 59 | Admitting: Gastroenterology

## 2023-10-17 ENCOUNTER — Other Ambulatory Visit: Payer: Self-pay

## 2023-10-17 ENCOUNTER — Ambulatory Visit: Attending: Nurse Practitioner | Admitting: Nurse Practitioner

## 2023-10-17 ENCOUNTER — Encounter: Payer: Self-pay | Admitting: Nurse Practitioner

## 2023-10-17 VITALS — BP 155/90 | HR 90 | Resp 19 | Ht 73.0 in | Wt 219.0 lb

## 2023-10-17 DIAGNOSIS — R7303 Prediabetes: Secondary | ICD-10-CM

## 2023-10-17 DIAGNOSIS — I700 Atherosclerosis of aorta: Secondary | ICD-10-CM

## 2023-10-17 DIAGNOSIS — I714 Abdominal aortic aneurysm, without rupture, unspecified: Secondary | ICD-10-CM | POA: Diagnosis not present

## 2023-10-17 DIAGNOSIS — J432 Centrilobular emphysema: Secondary | ICD-10-CM

## 2023-10-17 DIAGNOSIS — I10 Essential (primary) hypertension: Secondary | ICD-10-CM

## 2023-10-17 DIAGNOSIS — D649 Anemia, unspecified: Secondary | ICD-10-CM

## 2023-10-17 MED ORDER — FLUTICASONE-SALMETEROL 250-50 MCG/ACT IN AEPB
1.0000 | INHALATION_SPRAY | Freq: Two times a day (BID) | RESPIRATORY_TRACT | 3 refills | Status: DC
  Filled 2023-10-17: qty 60, 30d supply, fill #0

## 2023-10-17 MED ORDER — AMLODIPINE BESYLATE 5 MG PO TABS
5.0000 mg | ORAL_TABLET | Freq: Every day | ORAL | 1 refills | Status: DC
Start: 2023-10-17 — End: 2023-12-20
  Filled 2023-10-17: qty 90, 90d supply, fill #0

## 2023-10-17 MED ORDER — METOPROLOL SUCCINATE ER 25 MG PO TB24
25.0000 mg | ORAL_TABLET | Freq: Every day | ORAL | 3 refills | Status: DC
Start: 2023-10-17 — End: 2023-12-20
  Filled 2023-10-17: qty 90, 90d supply, fill #0

## 2023-10-17 NOTE — Progress Notes (Signed)
 Assessment & Plan:  Luis Marquez was seen today for medical management of chronic issues.  Diagnoses and all orders for this visit:  Primary hypertension -     CMP14+EGFR -     metoprolol  succinate (TOPROL -XL) 25 MG 24 hr tablet; Take 1 tablet (25 mg total) by mouth daily. -     amLODipine  (NORVASC ) 5 MG tablet; Take 1 tablet (5 mg total) by mouth daily.  Prediabetes -     Hemoglobin A1c  Anemia, unspecified type -     CBC with Differential  Aortic atherosclerosis  Continue atorvastatin  40 mg daily  Centrilobular emphysema  Will try inhaler for shortness of breath -     fluticasone -salmeterol (ADVAIR  DISKUS) 250-50 MCG/ACT AEPB; Inhale 1 puff into the lungs in the morning and at bedtime.  Abdominal aortic aneurysm (AAA) without rupture, unspecified part [facility]) -     Ambulatory referral to Vascular Surgery     Patient has been counseled on age-appropriate routine health concerns for screening and prevention. These are reviewed and up-to-date. Referrals have been placed accordingly. Immunizations are up-to-date or declined.    Subjective:   Chief Complaint  Patient presents with   Medical Management of Chronic Issues    Luis Marquez 63 y.o. male presents to office today for follow up to HTN.  PMH significant for systolic CHF (EF now normalized), PAF on Eliquis , CAD, COPD, alcohol abuse with h/o DTs, pancreatic hematoma,   Admitted to hospital 03-2023 Presented to the ED with complaint of epigastric pain, intermittent hematochezia for months. Initial workup with CT abdomen showed acute pancreatitis with developing pseudoaneurysm and pancreatic head.  It also showed a AAA with enhancing ulcer along with mural plaque and thrombus Lipase level was over 1000  Over the course of next several days, pancreatitis slowly improved.  Diet was advanced. 9/27, patient had  multiple episodes of hematochezia and worsening abdominal pain 9/28, hemoglobin down to 6.6, transfusion given CT angio  abdomen/pelvis was obtained which did not show any evidence of active GI bleeding.  It did not show any evidence of pancreatic pseudoaneurysm or venous thrombosis but it showed progressive acute pancreatitis with increased inflammation around the pancreatic head and new involvement of the tail with an additional finding of a 2.1 cm hematoma in the pancreatic head Over the course of next several days, pancreatitis slowly improved.  Diet was advanced. 9/27, patient had  multiple episodes of hematochezia and worsening abdominal pain 9/28, hemoglobin down to 6.6, transfusion given CT angio abdomen/pelvis was obtained which did not show any evidence of active GI bleeding.  It did not show any evidence of pancreatic pseudoaneurysm or venous thrombosis but it showed progressive acute pancreatitis with increased inflammation around the pancreatic head and new involvement of the tail with an additional finding of a 2.1 cm hematoma in the pancreatic head Eliquis  stopped because of hematoma in pancreas  Losartan  stopped because of low blood pressure.    Blood pressure is elevated today. He is taking metoprolol . We will add amlodipine  again today. If there is BLE edema we will increase metoprolol  and dc amlodipine .  BP Readings from Last 3 Encounters:  10/17/23 (!) 155/90  04/24/23 100/75  03/21/23 97/65     Notes gradual worsening shortness of breath with activity.     Prediabetes Lab Results  Component Value Date   HGBA1C 5.2 10/17/2023    Review of Systems  Constitutional:  Negative for fever, malaise/fatigue and weight loss.  HENT: Negative.  Negative for nosebleeds.  Eyes: Negative.  Negative for blurred vision, double vision and photophobia.  Respiratory:  Positive for shortness of breath. Negative for cough.   Cardiovascular: Negative.  Negative for chest pain, palpitations and leg swelling.  Gastrointestinal: Negative.  Negative for heartburn, nausea and vomiting.  Musculoskeletal:  Negative.  Negative for myalgias.  Neurological: Negative.  Negative for dizziness, focal weakness, seizures and headaches.  Psychiatric/Behavioral: Negative.  Negative for suicidal ideas.     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   Emphysema, unspecified (HCC)    ETOH abuse    Fracture, intertrochanteric, right femur (HCC) 11/11/2014   Hypertension    Hypomagnesemia    Laceration of head 11/03/2021   Paroxysmal A-fib (HCC)    Syncope 12/07/2021   Tobacco abuse     Past Surgical History:  Procedure Laterality Date   BIOPSY  12/10/2021   Procedure: BIOPSY;  Surgeon: Sergio Dandy, MD;  Location: [facility] ENDOSCOPY;  Service: Gastroenterology;;   BIOPSY  11/24/2022   Procedure: BIOPSY;  Surgeon: Nannette Babe, MD;  Location: [facility] ENDOSCOPY;  Service: Gastroenterology;;   COLONOSCOPY WITH PROPOFOL  N/A 11/24/2022   Procedure: COLONOSCOPY WITH PROPOFOL ;  Surgeon: Nannette Babe, MD;  Location: [facility] ENDOSCOPY;  Service: Gastroenterology;  Laterality: N/A;   ESOPHAGOGASTRODUODENOSCOPY (EGD) WITH PROPOFOL  N/A 12/10/2021   Procedure: ESOPHAGOGASTRODUODENOSCOPY (EGD) WITH PROPOFOL ;  Surgeon: Sergio Dandy, MD;  Location: [facility] ENDOSCOPY;  Service: Gastroenterology;  Laterality: N/A;   ESOPHAGOGASTRODUODENOSCOPY (EGD) WITH PROPOFOL  N/A 11/24/2022   Procedure: ESOPHAGOGASTRODUODENOSCOPY (EGD) WITH PROPOFOL ;  Surgeon: Nannette Babe, MD;  Location: [facility] ENDOSCOPY;  Service: Gastroenterology;  Laterality: N/A;   HEMOSTASIS CLIP PLACEMENT  12/10/2021   Procedure: HEMOSTASIS CLIP PLACEMENT;  Surgeon: Sergio Dandy, MD;  Location: [facility] ENDOSCOPY;  Service: Gastroenterology;;   HERNIA REPAIR     INTRAMEDULLARY (IM) NAIL INTERTROCHANTERIC Right 11/11/2014   Procedure: INTRAMEDULLARY (IM) NAIL INTERTROCHANTRIC;  Surgeon: Claiborne Crew, MD;  Location: [facility] ORS;  Service: Orthopedics;  Laterality: Right;   POLYPECTOMY  11/24/2022    Procedure: POLYPECTOMY;  Surgeon: Nannette Babe, MD;  Location: [facility] ENDOSCOPY;  Service: Gastroenterology;;   RIGHT/LEFT HEART CATH AND CORONARY ANGIOGRAPHY N/A 11/11/2021   Procedure: RIGHT/LEFT HEART CATH AND CORONARY ANGIOGRAPHY;  Surgeon: Mardell Shade, MD;  Location: [facility] INVASIVE CV LAB;  Service: Cardiovascular;  Laterality: N/A;   Rod right leg      Family History  Problem Relation Age of Onset   Diabetes Mother    Colon cancer Neg Hx    Stomach cancer Neg Hx    Esophageal cancer Neg Hx     Social History Reviewed with no changes to be made today.   Outpatient Medications Prior to Visit  Medication Sig Dispense Refill   albuterol  (VENTOLIN  HFA) 108 (90 Base) MCG/ACT inhaler Inhale 2 puffs into the lungs every 6 (six) hours as needed for wheezing or shortness of breath. 18 g 3   atorvastatin  (LIPITOR) 40 MG tablet Take 1 tablet (40 mg total) by mouth daily. 90 tablet 3   B Complex-C-Folic Acid  (B-COMPLEX BALANCED) TABS Take 1 tablet by mouth daily.     Fe Fum-FePoly-FA-Vit C-Vit B3 (IRON  FOLATE-F PO) Take by mouth.     feeding supplement (ENSURE ENLIVE / ENSURE PLUS) LIQD Take 237 mLs by mouth 3 (three) times daily between meals. 237 mL 12   loperamide  (IMODIUM  A-D) 2 MG tablet Take 1 tablet (2 mg total) by  mouth 4 (four) times daily as needed for diarrhea or loose stools. 30 tablet 0   Misc Natural Products (OSTEO BI-FLEX TRIPLE STRENGTH PO) Take 1 mg by mouth 2 (two) times daily.     Multiple Vitamin (MULTIVITAMIN) tablet Take 1 tablet by mouth daily.     Omega-3 Fatty Acids (OMEGA-3 FISH OIL) 1200 MG CAPS Take 1 capsule by mouth daily.     tetrahydrozoline 0.05 % ophthalmic solution Place 2 drops into both eyes daily.     fluticasone -salmeterol (ADVAIR  DISKUS) 250-50 MCG/ACT AEPB Inhale 1 puff into the lungs in the morning and at bedtime. 60 each 3   metoprolol  succinate (TOPROL -XL) 25 MG 24 hr tablet Take 1 tablet (25 mg total) by mouth daily. 90 tablet 3   pantoprazole   (PROTONIX ) 40 MG tablet Take 1 tablet (40 mg total) by mouth 2 (two) times daily. 180 tablet 0   potassium chloride  SA (KLOR-CON  M) 20 MEQ tablet Take 2 tablets (40 mEq total) by mouth daily at 12 noon for 1 day, THEN 1 tablet (20 mEq total) daily at 12 noon. 31 tablet 6   No facility-administered medications prior to visit.    No Known Allergies     Objective:    BP (!) 155/90 (BP Location: Left Arm, Patient Position: Sitting, Cuff Size: Normal)   Pulse 90   Resp 19   Ht 6\' 1"  (1.854 m)   Wt 219 lb (99.3 kg)   SpO2 98%   BMI 28.89 kg/m  Wt Readings from Last 3 Encounters:  10/17/23 219 lb (99.3 kg)  04/24/23 222 lb 10.6 oz (101 kg)  03/21/23 207 lb 6.4 oz (94.1 kg)    Physical Exam Vitals and nursing note reviewed.  Constitutional:      Appearance: He is well-developed.  HENT:     Head: Normocephalic and atraumatic.  Cardiovascular:     Rate and Rhythm: Normal rate and regular rhythm.     Heart sounds: Normal heart sounds. No murmur heard.    No friction rub. No gallop.  Pulmonary:     Effort: Pulmonary effort is normal. No tachypnea or respiratory distress.     Breath sounds: Normal breath sounds. No decreased breath sounds, wheezing, rhonchi or rales.  Chest:     Chest wall: No tenderness.  Abdominal:     General: Bowel sounds are normal.     Palpations: Abdomen is soft.  Musculoskeletal:        General: Normal range of motion.     Cervical back: Normal range of motion.  Skin:    General: Skin is warm and dry.  Neurological:     Mental Status: He is alert and oriented to person, place, and time.     Coordination: Coordination normal.  Psychiatric:        Behavior: Behavior normal. Behavior is cooperative.        Thought Content: Thought content normal.        Judgment: Judgment normal.          Patient has been counseled extensively about nutrition and exercise as well as the importance of adherence with medications and regular follow-up. The patient  was given clear instructions to go to ER or return to medical center if symptoms don't improve, worsen or new problems develop. The patient verbalized understanding.   Follow-up: Return in about 3 months (around 01/16/2024).   Collins Dean, FNP-BC [facility] [place], [place] [phone]   12/09/2023, 10:24 PM

## 2023-10-17 NOTE — Patient Instructions (Addendum)
 Vesta Mixer, MD Address: [address],  [place], [place] [address] Phone: [phone]  [facility] Address: [address],  [place], [place] [address] Phone: [phone]

## 2023-10-18 LAB — CMP14+EGFR
ALT: 11 IU/L (ref 0–44)
AST: 19 IU/L (ref 0–40)
Albumin: 3.5 g/dL — ABNORMAL LOW (ref 3.9–4.9)
Alkaline Phosphatase: 130 IU/L — ABNORMAL HIGH (ref 44–121)
BUN/Creatinine Ratio: 10 (ref 10–24)
BUN: 8 mg/dL (ref 8–27)
Bilirubin Total: 0.4 mg/dL (ref 0.0–1.2)
CO2: 24 mmol/L (ref 20–29)
Calcium: 9 mg/dL (ref 8.6–10.2)
Chloride: 102 mmol/L (ref 96–106)
Creatinine, Ser: 0.79 mg/dL (ref 0.76–1.27)
Globulin, Total: 2.8 g/dL (ref 1.5–4.5)
Glucose: 101 mg/dL — ABNORMAL HIGH (ref 70–99)
Potassium: 3.9 mmol/L (ref 3.5–5.2)
Sodium: 143 mmol/L (ref 134–144)
Total Protein: 6.3 g/dL (ref 6.0–8.5)
eGFR: 99 mL/min/{1.73_m2} (ref >59)

## 2023-10-18 LAB — HEMOGLOBIN A1C
Est. average glucose Bld gHb Est-mCnc: 103 mg/dL
Hgb A1c MFr Bld: 5.2 % (ref 4.8–5.6)

## 2023-10-18 LAB — CBC WITH DIFFERENTIAL/PLATELET
Basophils Absolute: 0 10*3/uL (ref 0.0–0.2)
Basos: 1 %
EOS (ABSOLUTE): 0.3 10*3/uL (ref 0.0–0.4)
Eos: 3 %
Hematocrit: 39.5 % (ref 37.5–51.0)
Hemoglobin: 12.9 g/dL — ABNORMAL LOW (ref 13.0–17.7)
Immature Grans (Abs): 0 10*3/uL (ref 0.0–0.1)
Immature Granulocytes: 0 %
Lymphocytes Absolute: 0.9 10*3/uL (ref 0.7–3.1)
Lymphs: 11 %
MCH: 32.1 pg (ref 26.6–33.0)
MCHC: 32.7 g/dL (ref 31.5–35.7)
MCV: 98 fL — ABNORMAL HIGH (ref 79–97)
Monocytes Absolute: 0.4 10*3/uL (ref 0.1–0.9)
Monocytes: 5 %
Neutrophils Absolute: 6.2 10*3/uL (ref 1.4–7.0)
Neutrophils: 80 %
Platelets: 328 10*3/uL (ref 150–450)
RBC: 4.02 x10E6/uL — ABNORMAL LOW (ref 4.14–5.80)
RDW: 13.7 % (ref 11.6–15.4)
WBC: 7.8 10*3/uL (ref 3.4–10.8)

## 2023-10-20 ENCOUNTER — Telehealth: Payer: Self-pay | Admitting: Nurse Practitioner

## 2023-10-20 NOTE — Telephone Encounter (Signed)
 Copied from CRM [id number]. Topic: General - Other >> Oct 19, 2023  4:58 PM Emylou G wrote: Reason for CRM: adv patient of lab results.Marland Kitchen

## 2023-10-26 ENCOUNTER — Other Ambulatory Visit: Payer: Self-pay

## 2023-10-27 ENCOUNTER — Other Ambulatory Visit: Payer: Self-pay

## 2023-11-06 ENCOUNTER — Ambulatory Visit: Payer: 59 | Admitting: Dermatology

## 2023-12-04 ENCOUNTER — Other Ambulatory Visit: Payer: Self-pay | Admitting: Family Medicine

## 2023-12-04 ENCOUNTER — Other Ambulatory Visit: Payer: Self-pay

## 2023-12-04 DIAGNOSIS — K219 Gastro-esophageal reflux disease without esophagitis: Secondary | ICD-10-CM

## 2023-12-05 ENCOUNTER — Other Ambulatory Visit: Payer: Self-pay

## 2023-12-05 MED ORDER — PANTOPRAZOLE SODIUM 40 MG PO TBEC
40.0000 mg | DELAYED_RELEASE_TABLET | Freq: Two times a day (BID) | ORAL | 0 refills | Status: DC
  Filled 2023-12-05: qty 180, 90d supply, fill #0

## 2023-12-06 ENCOUNTER — Other Ambulatory Visit: Payer: Self-pay

## 2023-12-08 ENCOUNTER — Other Ambulatory Visit: Payer: Self-pay

## 2023-12-09 ENCOUNTER — Encounter: Payer: Self-pay | Admitting: Nurse Practitioner

## 2023-12-13 ENCOUNTER — Other Ambulatory Visit: Payer: Self-pay

## 2023-12-20 ENCOUNTER — Encounter: Payer: Self-pay | Admitting: Cardiology

## 2023-12-20 ENCOUNTER — Other Ambulatory Visit: Payer: Self-pay

## 2023-12-20 ENCOUNTER — Ambulatory Visit: Attending: Cardiology | Admitting: Cardiology

## 2023-12-20 VITALS — BP 120/88 | HR 101 | Resp 16 | Ht 73.0 in | Wt 207.8 lb

## 2023-12-20 DIAGNOSIS — I7143 Infrarenal abdominal aortic aneurysm, without rupture: Secondary | ICD-10-CM | POA: Diagnosis not present

## 2023-12-20 DIAGNOSIS — I5032 Chronic diastolic (congestive) heart failure: Secondary | ICD-10-CM | POA: Diagnosis not present

## 2023-12-20 DIAGNOSIS — I5020 Unspecified systolic (congestive) heart failure: Secondary | ICD-10-CM

## 2023-12-20 DIAGNOSIS — I10 Essential (primary) hypertension: Secondary | ICD-10-CM | POA: Diagnosis not present

## 2023-12-20 DIAGNOSIS — I4819 Other persistent atrial fibrillation: Secondary | ICD-10-CM

## 2023-12-20 DIAGNOSIS — Z720 Tobacco use: Secondary | ICD-10-CM

## 2023-12-20 DIAGNOSIS — F1010 Alcohol abuse, uncomplicated: Secondary | ICD-10-CM

## 2023-12-20 MED ORDER — METOPROLOL SUCCINATE ER 50 MG PO TB24
50.0000 mg | ORAL_TABLET | Freq: Every day | ORAL | 3 refills | Status: AC
  Filled 2023-12-20: qty 90, 90d supply, fill #0
  Filled 2024-05-03 – 2024-05-30 (×2): qty 90, 90d supply, fill #1

## 2023-12-20 MED ORDER — LOSARTAN POTASSIUM 25 MG PO TABS
25.0000 mg | ORAL_TABLET | Freq: Every day | ORAL | 3 refills | Status: DC
  Filled 2023-12-20: qty 90, 90d supply, fill #0
  Filled 2024-05-03 – 2024-05-30 (×2): qty 90, 90d supply, fill #1

## 2023-12-20 NOTE — Patient Instructions (Signed)
 Medication Instructions:  Discontinue Amlodipine    Increase Metoprolol  Succinate (TOPROL -XL) to 50 mg once daily in the morning   Start Losartan  25 mg once daily at night  *If you need a refill on your cardiac medications before your next appointment, please call your pharmacy*  Lab Work: None If you have labs (blood work) drawn today and your tests are completely normal, you will receive your results only by: MyChart Message (if you have MyChart) OR A paper copy in the mail If you have any lab test that is abnormal or we need to change your treatment, we will call you to review the results.  Testing/Procedures: None  Follow-Up: At [facility], you and your health needs are our priority.  As part of our continuing mission to provide you with exceptional heart care, our providers are all part of one team.  This team includes your primary Cardiologist (physician) and Advanced Practice Providers or APPs (Physician Assistants and Nurse Practitioners) who all work together to provide you with the care you need, when you need it.  Your next appointment:   6 month(s)  Provider:   Dr. Albert Huff  We recommend signing up for the patient portal called "MyChart".  Sign up information is provided on this After Visit Summary.  MyChart is used to connect with patients for Virtual Visits (Telemedicine).  Patients are able to view lab/test results, encounter notes, upcoming appointments, etc.  Non-urgent messages can be sent to your provider as well.   To learn more about what you can do with MyChart, go to [email or web address].   Other Instructions You have been referred to Electrophysiology for a Watchman Consult. Please make this appointment AFTER meeting with your Gastroenterologist.

## 2023-12-20 NOTE — Progress Notes (Signed)
 Cardiology Office Note:  .   Date:  12/20/2023  ID:  Luis Marquez, DOB 01/06/1961, MRN [id number] PCP:  Collins Dean, NP  Former Cardiology Providers: Dr. Alroy Aspen, Dr. Renna Cary, Dr. [facility] Providers Cardiologist:  None , [facility] (established care 12/20/23) Electrophysiologist:  None  Click to update primary MD,subspecialty MD or APP then REFRESH:1}    Chief Complaint  Patient presents with   Chronic systolic congestive heart failure   Follow-up    History of Present Illness: Luis Marquez   Luis Marquez is a 63 y.o. Caucasian male whose past medical history and cardiovascular risk factors includes: Paroxysmal atrial fibrillation, history of Takotsubo, Hypertension, emphysema, EtOH use, smoking.  Patient was formally under the care of advanced heart failure with Dr. Julane Ny.  Last progress note from February 2024 reviewed. In May 2023 patient was hospitalized after a fall/?  Syncope noted to be in A-fib with RVR started on diltiazem  drip.  Echocardiogram showed LVEF of 20-25% with mildly reduced RV function.  It was felt that the reduction in LVEF is possible Takotsubo.  He underwent right and left heart catheterization which noted minimal CAD and normal filling pressures.  Cardiac MRI showed improving LVEF to 42%.  Amiodarone  was weaned off and GDMT uptitrated.  Hospitalization was complicated by EtOH withdrawal/delirium and hypercalcemia EtOH levels in the past have been as high as 116.  Hospitalized again in August 2023 for symptomatic tachycardia found to be having profound electrolyte abnormalities with potassium of 2 and magnesium  at 1.  During the last office visit with Dr. Alroy Aspen in August 2024 patient was being considered for hip replacement.  Patient states that the pain is stable and well-controlled and he has not undergone surgical intervention.  He denies anginal chest pain. Still experiences shortness of breath with effort related activities.  He was recommended to  be on inhalers given his underlying COPD and active cigarette smoking.  Patient states that inhalers are costing him $90 which is cost prohibitive.  He still continues to smoke at least 7 cigarettes/day and drinks 1.5 cans of beer daily.  Since last office visit patient has discontinued Eliquis  after his recent hospitalization in October 2020 for.  Based on the discharge summary patient was experiencing intermittent hematochezia for months.  He was noted to have a hemoglobin as low as 6.6 g/dL requiring blood transfusions.  Eliquis  was held.  He was supposed to follow-up with gastroenterology as outpatient for further evaluation and management prior to considering reinitiation of anticoagulation.  This is still pending.    During the hospitalization he had multiple CT scans of the abdomen which reported an infrarenal abdominal aortic aneurysm with ulcerated plaque.  He has a follow-up appointment with vascular surgery on July  2025 according to the patient.  Review of Systems: .   Review of Systems  Cardiovascular:  Positive for dyspnea on exertion. Negative for chest pain, claudication, irregular heartbeat, leg swelling, near-syncope, orthopnea, palpitations, paroxysmal nocturnal dyspnea and syncope.  Respiratory:  Positive for shortness of breath.   Hematologic/Lymphatic: Negative for bleeding problem.    Studies Reviewed:   EKG: EKG Interpretation Date/Time:  Wednesday December 20 2023 13:58:09 EDT Ventricular Rate:  102 PR Interval:  176 QRS Duration:  126 QT Interval:  382 QTC Calculation: 497 R Axis:   58  Text Interpretation: Sinus tachycardia with Premature atrial complexes Right bundle branch block When compared with ECG of 06-Apr-2023 11:42, No significant change since last tracing Confirmed by Olinda Bertrand [phone]) on  12/20/2023 2:01:40 PM  Echocardiogram: (5/23): EF 25%, mid and apical hypokinesis with sparing of base, RV mildly reduced. Echo concerning for Takotsubo    03/2023 LVEF 60 to 65%.  Grade 1 diastolic dysfunction.  Right ventricular size and function normal.  Small pericardial effusion.  Estimated RAP 3 mmHg  cMRI (5/23): LVEF 42%  evidence of potential Tako-tsubo CM  RV normal    R/LHC  (5/23):   Prox Cx lesion is 20% stenosed.   Mid LAD lesion is 30% stenosed.   The left ventricular ejection fraction is 25-35% by visual estimate.     Ao = 119/68 (90) LV = 119/17 RA =  3 RV = 27/6 PA = 25/9 (15) PCW = 10 Fick cardiac output/index = 8.2/3.7 PVR = 0.6 WU Ao sat = 94% PA sat = 72%, 73% SVC sat = 74%   RADIOLOGY: CT abdomen pelvis with contrast: 04/06/2023 Peripancreatic fat stranding identified including significant stranding towards the pancreatic groove. Please correlate for clinical evidence of pancreatitis. No well-defined fluid collections. However there is a focal 8 mm area of hyperenhancement posterior to the pancreatic head and uncinate worrisome for developing pseudoaneurysm. Recommend further evaluation.   Fatty liver infiltration.   Dilated gallbladder. Follow up ultrasound is recommended when appropriate to assess for stones.   Trace ascites.   Increasing size abdominal aortic aneurysm now measuring up to 3.7 cm. There is also increasing changes of a penetrating enhancing ulcer along the mural plaque and thrombus along the lower abdominal aorta. Recommend vascular surgery consultation when appropriate.   Colonic diverticula.  CTA abdomen/pelvis with and without contrast 04/16/2023 VASCULAR   1. No evidence of active GI bleeding at the time of this scan. 2. No evidence of arterial pseudoaneurysm or venous thrombosis as a complication from acute pancreatitis. 3. Irregular aneurysmal dilation of the infrarenal abdominal aorta with highly irregular/ulcerated atherosclerotic plaque and multifocal penetrating atherosclerotic ulcerations. Maximal aortic diameter is 3.6 cm. Recommend follow-up ultrasound every 3  years. (Ref.: J Vasc Surg. 2018; 67:2-77 and J Am Coll Radiol 2013;10(10):789-794.)   NON-VASCULAR   1. Progressive acute pancreatitis with increased inflammatory changes about the pancreatic head and new involvement of the pancreatic tail. Additionally, findings suggest hemorrhagic pancreatitis with development of a rounded 2.1 x 1.9 cm high attenuation focus posterior and inferior to the pancreatic head likely representing a region of hematoma. A smaller focus of probable bleeding was present on the prior study from 04/06/2023. Importantly, no definite pseudoaneurysm or active bleeding was visible on the arterial phase images. The close proximity of this focus of hemorrhagic pancreatitis to the immediately adjacent duodenum could result in some manifestation of GI bleeding. 2. Additional ancillary findings as above including mild centrilobular pulmonary emphysema, hepatic steatosis, and extensive colonic diverticulosis.   Aortic aneurysm NOS (ICD10-I71.9); Aortic Atherosclerosis (ICD10-I70.0) and Emphysema (ICD10-J43.9).  Risk Assessment/Calculations:   Click Here to Calculate/Change CHADS2VASc Score The patient's CHADS2-VASc score is 4, indicating a 4.8% annual risk of stroke. CHF History: Yes HTN History: Yes Diabetes History: No Stroke History: No Vascular Disease History: Yes  Labs:       Latest Ref Rng & Units 10/17/2023    3:12 PM 04/23/2023    5:44 AM 04/22/2023    6:01 AM  CBC  WBC 3.4 - 10.8 x10E3/uL 7.8  5.5  5.7   Hemoglobin 13.0 - 17.7 g/dL 16.1  8.1  7.8   Hematocrit 37.5 - 51.0 % 39.5  24.1  24.0   Platelets 150 - 450  x10E3/uL 328  261  265        Latest Ref Rng & Units 10/17/2023    3:12 PM 04/23/2023    5:44 AM 04/22/2023    6:01 AM  BMP  Glucose 70 - 99 mg/dL 409  811  914   BUN 8 - 27 mg/dL 8  10  8    Creatinine 0.76 - 1.27 mg/dL 7.82  9.56  2.13   BUN/Creat Ratio 10 - 24 10     Sodium 134 - 144 mmol/L 143  134  135   Potassium 3.5 - 5.2 mmol/L  3.9  3.9  3.8   Chloride 96 - 106 mmol/L 102  101  103   CO2 20 - 29 mmol/L 24  26  24    Calcium  8.6 - 10.2 mg/dL 9.0  8.1  7.8       Latest Ref Rng & Units 10/17/2023    3:12 PM 04/23/2023    5:44 AM 04/22/2023    6:01 AM  CMP  Glucose 70 - 99 mg/dL 086  578  469   BUN 8 - 27 mg/dL 8  10  8    Creatinine 0.76 - 1.27 mg/dL 6.29  5.28  4.13   Sodium 134 - 144 mmol/L 143  134  135   Potassium 3.5 - 5.2 mmol/L 3.9  3.9  3.8   Chloride 96 - 106 mmol/L 102  101  103   CO2 20 - 29 mmol/L 24  26  24    Calcium  8.6 - 10.2 mg/dL 9.0  8.1  7.8   Total Protein 6.0 - 8.5 g/dL 6.3  4.6  4.2   Total Bilirubin 0.0 - 1.2 mg/dL 0.4  0.2  <2.4   Alkaline Phos 44 - 121 IU/L 130  87  89   AST 0 - 40 IU/L 19  19  20    ALT 0 - 44 IU/L 11  16  16       Lab Results  Component Value Date   CHOL 178 08/16/2022   HDL 62 08/16/2022   LDLCALC 97 08/16/2022   TRIG 105 08/16/2022   CHOLHDL 2.9 08/16/2022   No results for input(s): "LIPOA" in the last 8760 hours. No components found for: "NTPROBNP" No results for input(s): "PROBNP" in the last 8760 hours. No results for input(s): "TSH" in the last 8760 hours.  Physical Exam:    Today's Vitals   12/20/23 1355  BP: 120/88  Pulse: (!) 101  Resp: 16  SpO2: 93%  Weight: 207 lb 12.8 oz (94.3 kg)  Height: 6\' 1"  (1.854 m)   Body mass index is 27.42 kg/m. Wt Readings from Last 3 Encounters:  12/20/23 207 lb 12.8 oz (94.3 kg)  10/17/23 219 lb (99.3 kg)  04/24/23 222 lb 10.6 oz (101 kg)    Physical Exam  Constitutional: No distress.  hemodynamically stable  Neck: No JVD present.  Cardiovascular: Regular rhythm, S1 normal and S2 normal. Tachycardia present. Exam reveals no gallop, no S3 and no S4.  No murmur heard. Pulmonary/Chest: Effort normal and breath sounds normal. No stridor. He has no wheezes. He has no rales.  Musculoskeletal:        General: Edema present.     Cervical back: Neck supple.  Skin: Skin is warm.     Impression &  Recommendation(s):  Impression:   ICD-10-CM   1. Chronic heart failure with preserved ejection fraction (HFpEF) (HCC)  I50.32 EKG 12-Lead    2. Heart failure with  improved ejection fraction (HFimpEF) (HCC)  I50.32     3. Persistent atrial fibrillation (HCC)  I48.19 Ambulatory referral to Cardiac Electrophysiology    4. ETOH abuse  F10.10     5. Tobacco abuse  Z72.0     6. Primary hypertension  I10 losartan  (COZAAR ) 25 MG tablet    metoprolol  succinate (TOPROL -XL) 50 MG 24 hr tablet    7. Infrarenal abdominal aortic aneurysm (AAA) without rupture (HCC)  I71.43        Recommendation(s):  Chronic heart failure with preserved EF/heart failure with improved EF May 2023: LVEF 25%. September 2024: LVEF 60 to 65% with grade 1 diastolic dysfunction Restart losartan  25 mg p.o. every afternoon. Increase Toprol -XL 25 mg p.o. daily to 50 mg p.o. every morning. Discontinue amlodipine . Discussed uptitration of GDMT-patient states that the medications in the past have not cost prohibitive. Strict I's and O's and daily weights. Reemphasized importance of complete smoking cessation as well as alcohol cessation  Persistent atrial fibrillation (HCC) Rate control: Metoprolol . Rhythm control: N/A, history of being on amiodarone . Thromboembolic prophylaxis: None EKG today illustrates sinus rhythm Given his CHA2DS2-VASc SCORE he is at risk of thromboembolic events and would benefit from anticoagulation to mitigate this risk.  However, he has had hospitalizations for hematochezia with a hemoglobin as low as 6.6 mg/dL requiring blood transfusions.  Eliquis  was held at his last hospitalization and he was asked to follow-up with gastroenterology.  He still has not followed up with GI posthospitalization.  Recommended that he follows up with GI to see if he is cleared for anticoagulation.  May consider a retrial of anticoagulation and if able to tolerate for short period of time could be considered for  watchman implant to minimize his overall risk for thromboembolic events.  Will refer him to EP for watchman implant based on what GI recommends.  In the interim while is not on anticoagulation patient is aware of being cognizant of symptoms suggestive of stroke and to seek medical attention if such need arises.  ETOH abuse Reemphasized importance of complete alcohol cessation. He is hospitalizations for pancreatitis, multiple electrolyte abnormalities.  Tobacco abuse Currently smokes 7 cigarettes/day.  Reemphasized the importance of complete cessation.  Primary hypertension Office blood pressures are well-controlled. Medications as discussed above  Infrarenal abdominal aortic aneurysm (AAA) without rupture [facility]) Hospitalist service mentions that they did speak to vascular surgery during her last hospitalization, please refer to discharge summary Patient states that he had an appointment to see vascular in the vein providers in July 2025. Reemphasized importance of complete smoking cessation. Currently not on antiplatelet therapy or anticoagulation given his history of hematochezia and GI bleed.   Currently on Lipitor 40 mg p.o. nightly   Orders Placed:  Orders Placed This Encounter  Procedures   Ambulatory referral to Cardiac Electrophysiology    Referral Priority:   Routine    Referral Type:   Consultation    Referral Reason:   Specialty Services Required    Requested Specialty:   Cardiology    Number of Visits Requested:   1   EKG 12-Lead     Final Medication List:    Meds ordered this encounter  Medications   losartan  (COZAAR ) 25 MG tablet    Sig: Take 1 tablet (25 mg total) by mouth at bedtime.    Dispense:  90 tablet    Refill:  3   metoprolol  succinate (TOPROL -XL) 50 MG 24 hr tablet    Sig: Take 1 tablet (50 mg  total) by mouth daily. Hold, if systolic (top number) is less than 100, and/or if heart rate is less than 55    Dispense:  90 tablet    Refill:  3     Medications Discontinued During This Encounter  Medication Reason   amLODipine  (NORVASC ) 5 MG tablet Discontinued by provider   metoprolol  succinate (TOPROL -XL) 25 MG 24 hr tablet      Current Outpatient Medications:    albuterol  (VENTOLIN  HFA) 108 (90 Base) MCG/ACT inhaler, Inhale 2 puffs into the lungs every 6 (six) hours as needed for wheezing or shortness of breath., Disp: 18 g, Rfl: 3   atorvastatin  (LIPITOR) 40 MG tablet, Take 1 tablet (40 mg total) by mouth daily., Disp: 90 tablet, Rfl: 3   B Complex-C-Folic Acid  (B-COMPLEX BALANCED) TABS, Take 1 tablet by mouth daily., Disp: , Rfl:    Fe Fum-FePoly-FA-Vit C-Vit B3 (IRON  FOLATE-F PO), Take by mouth., Disp: , Rfl:    feeding supplement (ENSURE ENLIVE / ENSURE PLUS) LIQD, Take 237 mLs by mouth 3 (three) times daily between meals., Disp: 237 mL, Rfl: 12   fluticasone -salmeterol (ADVAIR  DISKUS) 250-50 MCG/ACT AEPB, Inhale 1 puff into the lungs in the morning and at bedtime., Disp: 60 each, Rfl: 3   loperamide  (IMODIUM  A-D) 2 MG tablet, Take 1 tablet (2 mg total) by mouth 4 (four) times daily as needed for diarrhea or loose stools., Disp: 30 tablet, Rfl: 0   losartan  (COZAAR ) 25 MG tablet, Take 1 tablet (25 mg total) by mouth at bedtime., Disp: 90 tablet, Rfl: 3   Misc Natural Products (OSTEO BI-FLEX TRIPLE STRENGTH PO), Take 1 mg by mouth 2 (two) times daily., Disp: , Rfl:    Multiple Vitamin (MULTIVITAMIN) tablet, Take 1 tablet by mouth daily., Disp: , Rfl:    Omega-3 Fatty Acids (OMEGA-3 FISH OIL) 1200 MG CAPS, Take 1 capsule by mouth daily., Disp: , Rfl:    pantoprazole  (PROTONIX ) 40 MG tablet, Take 1 tablet (40 mg total) by mouth 2 (two) times daily., Disp: 180 tablet, Rfl: 0   tetrahydrozoline 0.05 % ophthalmic solution, Place 2 drops into both eyes daily., Disp: , Rfl:    metoprolol  succinate (TOPROL -XL) 50 MG 24 hr tablet, Take 1 tablet (50 mg total) by mouth daily. Hold, if systolic (top number) is less than 100, and/or if heart rate  is less than 55, Disp: 90 tablet, Rfl: 3   potassium chloride  SA (KLOR-CON  M) 20 MEQ tablet, Take 2 tablets (40 mEq total) by mouth daily at 12 noon for 1 day, THEN 1 tablet (20 mEq total) daily at 12 noon., Disp: 31 tablet, Rfl: 6  Consent:   NA  Disposition:   6 months sooner if needed  His questions and concerns were addressed to his satisfaction. He voices understanding of the recommendations provided during this encounter.    Signed, Awilda Bogus, [facility]  A Division of [facility] [address], [place], [place] [address]  [place], [place] [address] 12/20/2023 6:24 PM

## 2024-01-02 ENCOUNTER — Telehealth: Payer: Self-pay | Admitting: Nurse Practitioner

## 2024-01-02 NOTE — Telephone Encounter (Signed)
 Copied from CRM [id number]. Topic: Clinical - Medical Advice >> Jan 02, 2024 11:22 AM Phil Braun wrote:  Reason for CRM:   Aisha Hove with [facility] care, [phone]  Needs to verify all chronic medical conditions  Ref [phone]

## 2024-01-04 NOTE — Telephone Encounter (Signed)
 Awaiting fax, no call back number was left to verify information.

## 2024-01-12 ENCOUNTER — Other Ambulatory Visit: Payer: Self-pay

## 2024-01-12 DIAGNOSIS — I714 Abdominal aortic aneurysm, without rupture, unspecified: Secondary | ICD-10-CM

## 2024-01-15 ENCOUNTER — Telehealth: Payer: Self-pay | Admitting: Nurse Practitioner

## 2024-01-15 NOTE — Telephone Encounter (Signed)
 Contacted pt left vm to confirmed appt

## 2024-01-16 ENCOUNTER — Other Ambulatory Visit: Payer: Self-pay | Admitting: Nurse Practitioner

## 2024-01-16 ENCOUNTER — Other Ambulatory Visit: Payer: Self-pay

## 2024-01-16 ENCOUNTER — Ambulatory Visit: Attending: Nurse Practitioner | Admitting: Nurse Practitioner

## 2024-01-16 ENCOUNTER — Encounter: Payer: Self-pay | Admitting: Nurse Practitioner

## 2024-01-16 VITALS — BP 128/74 | HR 78 | Resp 19 | Ht 73.0 in | Wt 204.8 lb

## 2024-01-16 DIAGNOSIS — E7800 Pure hypercholesterolemia, unspecified: Secondary | ICD-10-CM

## 2024-01-16 DIAGNOSIS — D649 Anemia, unspecified: Secondary | ICD-10-CM | POA: Diagnosis not present

## 2024-01-16 DIAGNOSIS — R911 Solitary pulmonary nodule: Secondary | ICD-10-CM

## 2024-01-16 DIAGNOSIS — J439 Emphysema, unspecified: Secondary | ICD-10-CM | POA: Diagnosis not present

## 2024-01-16 DIAGNOSIS — I10 Essential (primary) hypertension: Secondary | ICD-10-CM

## 2024-01-16 DIAGNOSIS — F17200 Nicotine dependence, unspecified, uncomplicated: Secondary | ICD-10-CM

## 2024-01-16 MED ORDER — ALBUTEROL SULFATE HFA 108 (90 BASE) MCG/ACT IN AERS
2.0000 | INHALATION_SPRAY | Freq: Four times a day (QID) | RESPIRATORY_TRACT | 3 refills | Status: AC | PRN
Start: 2024-01-16 — End: ?
  Filled 2024-01-16: qty 18, 25d supply, fill #0
  Filled 2024-05-30: qty 18, 25d supply, fill #1

## 2024-01-16 NOTE — Progress Notes (Signed)
 Assessment & Plan:  Haidar was seen today for hypertension.  Diagnoses and all orders for this visit:  Primary hypertension -     CMP14+EGFR Continue all antihypertensives as prescribed.  Reminded to bring in blood pressure log for follow  up appointment.  RECOMMENDATIONS: DASH/Mediterranean Diets are healthier choices for HTN.    Anemia, unspecified type -     CBC with Differential  Hypercholesterolemia -     Lipid panel  Pulmonary emphysema, unspecified emphysema type (HCC) He has a history of 6mm LLL pulm nodule I placed 2 orders for chest CT and both were canceled due to unable to schedule with patient.  I am placing a new order again today -     Ambulatory referral to Pulmonology    Patient has been counseled on age-appropriate routine health concerns for screening and prevention. These are reviewed and up-to-date. Referrals have been placed accordingly. Immunizations are up-to-date or declined.    Subjective:   Chief Complaint  Patient presents with   Hypertension    Luis Marquez 63 y.o. male presents to office today for follow up to HTN  PMH significant for systolic CHF (EF now normalized), PAF (eliquis  on hold due to hemorrhagic hematoma), CAD, COPD, alcohol abuse with h/o DTs, pancreatic hematoma,    Blood pressure at goal. His medications were adjusted at his most recent cardiology office visit. Metoprolol   XL was increased to 50 mg from 25mg . Amlodipine  was stopped and he was instructed to restart losartan  25mg  daily.  BP Readings from Last 3 Encounters:  01/16/24 128/74  12/20/23 120/88  10/17/23 (!) 155/90    He has an upcoming appt with GI in 2 weeks.   Review of Systems  Constitutional:  Negative for fever, malaise/fatigue and weight loss.  HENT: Negative.  Negative for nosebleeds.   Eyes: Negative.  Negative for blurred vision, double vision and photophobia.  Respiratory: Negative.  Negative for cough and shortness of breath.   Cardiovascular:  Negative.  Negative for chest pain, palpitations and leg swelling.  Gastrointestinal: Negative.  Negative for heartburn, nausea and vomiting.  Musculoskeletal: Negative.  Negative for myalgias.  Neurological: Negative.  Negative for dizziness, focal weakness, seizures and headaches.  Psychiatric/Behavioral: Negative.  Negative for suicidal ideas.     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   Emphysema, unspecified (HCC)    ETOH abuse    Fracture, intertrochanteric, right femur (HCC) 11/11/2014   Hypertension    Hypomagnesemia    Laceration of head 11/03/2021   Paroxysmal A-fib (HCC)    Syncope 12/07/2021   Tobacco abuse     Past Surgical History:  Procedure Laterality Date   BIOPSY  12/10/2021   Procedure: BIOPSY;  Surgeon: Shila Gustav GAILS, MD;  Location: [facility] ENDOSCOPY;  Service: Gastroenterology;;   BIOPSY  11/24/2022   Procedure: BIOPSY;  Surgeon: Albertus Gordy HERO, MD;  Location: [facility] ENDOSCOPY;  Service: Gastroenterology;;   COLONOSCOPY WITH PROPOFOL  N/A 11/24/2022   Procedure: COLONOSCOPY WITH PROPOFOL ;  Surgeon: Albertus Gordy HERO, MD;  Location: [facility] ENDOSCOPY;  Service: Gastroenterology;  Laterality: N/A;   ESOPHAGOGASTRODUODENOSCOPY (EGD) WITH PROPOFOL  N/A 12/10/2021   Procedure: ESOPHAGOGASTRODUODENOSCOPY (EGD) WITH PROPOFOL ;  Surgeon: Shila Gustav GAILS, MD;  Location: [facility] ENDOSCOPY;  Service: Gastroenterology;  Laterality: N/A;   ESOPHAGOGASTRODUODENOSCOPY (EGD) WITH PROPOFOL  N/A 11/24/2022   Procedure: ESOPHAGOGASTRODUODENOSCOPY (EGD) WITH PROPOFOL ;  Surgeon: Albertus Gordy HERO, MD;  Location: [facility] ENDOSCOPY;  Service: Gastroenterology;  Laterality: N/A;   HEMOSTASIS CLIP PLACEMENT  12/10/2021   Procedure: HEMOSTASIS CLIP PLACEMENT;  Surgeon: Shila Gustav GAILS, MD;  Location: [facility] ENDOSCOPY;  Service: Gastroenterology;;   HERNIA REPAIR     INTRAMEDULLARY (IM) NAIL INTERTROCHANTERIC Right 11/11/2014   Procedure:  INTRAMEDULLARY (IM) NAIL INTERTROCHANTRIC;  Surgeon: Donnice Car, MD;  Location: [facility] ORS;  Service: Orthopedics;  Laterality: Right;   POLYPECTOMY  11/24/2022   Procedure: POLYPECTOMY;  Surgeon: Albertus Gordy HERO, MD;  Location: [facility] ENDOSCOPY;  Service: Gastroenterology;;   RIGHT/LEFT HEART CATH AND CORONARY ANGIOGRAPHY N/A 11/11/2021   Procedure: RIGHT/LEFT HEART CATH AND CORONARY ANGIOGRAPHY;  Surgeon: Cherrie Toribio SAUNDERS, MD;  Location: [facility] INVASIVE CV LAB;  Service: Cardiovascular;  Laterality: N/A;   Rod right leg      Family History  Problem Relation Age of Onset   Diabetes Mother    Colon cancer Neg Hx    Stomach cancer Neg Hx    Esophageal cancer Neg Hx     Social History Reviewed with no changes to be made today.   Outpatient Medications Prior to Visit  Medication Sig Dispense Refill   atorvastatin  (LIPITOR) 40 MG tablet Take 1 tablet (40 mg total) by mouth daily. 90 tablet 3   B Complex-C-Folic Acid  (B-COMPLEX BALANCED) TABS Take 1 tablet by mouth daily.     Fe Fum-FePoly-FA-Vit C-Vit B3 (IRON  FOLATE-F PO) Take by mouth.     feeding supplement (ENSURE ENLIVE / ENSURE PLUS) LIQD Take 237 mLs by mouth 3 (three) times daily between meals. 237 mL 12   metoprolol  succinate (TOPROL -XL) 50 MG 24 hr tablet Take 1 tablet (50 mg total) by mouth daily. Hold, if systolic (top number) is less than 100, and/or if heart rate is less than 55 90 tablet 3   Misc Natural Products (OSTEO BI-FLEX TRIPLE STRENGTH PO) Take 1 mg by mouth 2 (two) times daily.     Multiple Vitamin (MULTIVITAMIN) tablet Take 1 tablet by mouth daily.     Omega-3 Fatty Acids (OMEGA-3 FISH OIL) 1200 MG CAPS Take 1 capsule by mouth daily.     pantoprazole  (PROTONIX ) 40 MG tablet Take 1 tablet (40 mg total) by mouth 2 (two) times daily. 180 tablet 0   potassium chloride  SA (KLOR-CON  M) 20 MEQ tablet Take 2 tablets (40 mEq total) by mouth daily at 12 noon for 1 day, THEN 1 tablet (20 mEq total) daily at 12 noon. 31 tablet 6    tetrahydrozoline 0.05 % ophthalmic solution Place 2 drops into both eyes daily.     albuterol  (VENTOLIN  HFA) 108 (90 Base) MCG/ACT inhaler Inhale 2 puffs into the lungs every 6 (six) hours as needed for wheezing or shortness of breath. 18 g 3   loperamide  (IMODIUM  A-D) 2 MG tablet Take 1 tablet (2 mg total) by mouth 4 (four) times daily as needed for diarrhea or loose stools. 30 tablet 0   fluticasone -salmeterol (ADVAIR  DISKUS) 250-50 MCG/ACT AEPB Inhale 1 puff into the lungs in the morning and at bedtime. (Patient not taking: Reported on 01/16/2024) 60 each 3   losartan  (COZAAR ) 25 MG tablet Take 1 tablet (25 mg total) by mouth at bedtime. (Patient not taking: Reported on 01/16/2024) 90 tablet 3   No facility-administered medications prior to visit.    No Known Allergies     Objective:    BP 128/74 (BP Location: Left Arm, Patient Position: Sitting, Cuff Size: Normal)   Pulse 78   Resp 19   Ht 6' 1 (1.854  m)   Wt 204 lb 12.8 oz (92.9 kg)   SpO2 100%   BMI 27.02 kg/m  Wt Readings from Last 3 Encounters:  01/16/24 204 lb 12.8 oz (92.9 kg)  12/20/23 207 lb 12.8 oz (94.3 kg)  10/17/23 219 lb (99.3 kg)    Physical Exam Vitals and nursing note reviewed.  Constitutional:      Appearance: He is well-developed.  HENT:     Head: Normocephalic and atraumatic.   Cardiovascular:     Rate and Rhythm: Normal rate and regular rhythm.     Heart sounds: Normal heart sounds. No murmur heard.    No friction rub. No gallop.  Pulmonary:     Effort: Pulmonary effort is normal. No tachypnea or respiratory distress.     Breath sounds: Normal breath sounds. No decreased breath sounds, wheezing, rhonchi or rales.  Chest:     Chest wall: No tenderness.  Abdominal:     General: Bowel sounds are normal.     Palpations: Abdomen is soft.   Musculoskeletal:        General: Normal range of motion.     Cervical back: Normal range of motion.   Skin:    General: Skin is warm and dry.   Neurological:      Mental Status: He is alert and oriented to person, place, and time.     Coordination: Coordination normal.   Psychiatric:        Behavior: Behavior normal. Behavior is cooperative.        Thought Content: Thought content normal.        Judgment: Judgment normal.          Patient has been counseled extensively about nutrition and exercise as well as the importance of adherence with medications and regular follow-up. The patient was given clear instructions to go to ER or return to medical center if symptoms don't improve, worsen or new problems develop. The patient verbalized understanding.   Follow-up: Return in about 3 months (around 04/17/2024).   Haze LELON Servant, FNP-BC [facility] [place], [place] [phone]   01/16/2024, 6:27 PM

## 2024-01-16 NOTE — Patient Instructions (Signed)
 [facility] at [facility] Address: [address],  [place], [place] [address] Phone: [phone]

## 2024-01-17 LAB — CBC WITH DIFFERENTIAL/PLATELET
Basophils Absolute: 0.1 10*3/uL (ref 0.0–0.2)
Basos: 1 %
EOS (ABSOLUTE): 0.3 10*3/uL (ref 0.0–0.4)
Eos: 5 %
Hematocrit: 37.7 % (ref 37.5–51.0)
Hemoglobin: 12.2 g/dL — ABNORMAL LOW (ref 13.0–17.7)
Immature Grans (Abs): 0 10*3/uL (ref 0.0–0.1)
Immature Granulocytes: 0 %
Lymphocytes Absolute: 1.2 10*3/uL (ref 0.7–3.1)
Lymphs: 22 %
MCH: 31.4 pg (ref 26.6–33.0)
MCHC: 32.4 g/dL (ref 31.5–35.7)
MCV: 97 fL (ref 79–97)
Monocytes Absolute: 0.5 10*3/uL (ref 0.1–0.9)
Monocytes: 10 %
Neutrophils Absolute: 3.3 10*3/uL (ref 1.4–7.0)
Neutrophils: 62 %
Platelets: 269 10*3/uL (ref 150–450)
RBC: 3.89 x10E6/uL — ABNORMAL LOW (ref 4.14–5.80)
RDW: 13.6 % (ref 11.6–15.4)
WBC: 5.3 10*3/uL (ref 3.4–10.8)

## 2024-01-17 LAB — LIPID PANEL
Chol/HDL Ratio: 2.4 ratio (ref 0.0–5.0)
Cholesterol, Total: 114 mg/dL (ref 100–199)
HDL: 48 mg/dL (ref >39)
LDL Chol Calc (NIH): 44 mg/dL (ref 0–99)
Triglycerides: 127 mg/dL (ref 0–149)
VLDL Cholesterol Cal: 22 mg/dL (ref 5–40)

## 2024-01-17 LAB — CMP14+EGFR
ALT: 7 IU/L (ref 0–44)
AST: 20 IU/L (ref 0–40)
Albumin: 3.3 g/dL — ABNORMAL LOW (ref 3.9–4.9)
Alkaline Phosphatase: 102 IU/L (ref 44–121)
BUN/Creatinine Ratio: 13 (ref 10–24)
BUN: 11 mg/dL (ref 8–27)
Bilirubin Total: 0.5 mg/dL (ref 0.0–1.2)
CO2: 23 mmol/L (ref 20–29)
Calcium: 8.9 mg/dL (ref 8.6–10.2)
Chloride: 105 mmol/L (ref 96–106)
Creatinine, Ser: 0.85 mg/dL (ref 0.76–1.27)
Globulin, Total: 2.5 g/dL (ref 1.5–4.5)
Glucose: 71 mg/dL (ref 70–99)
Potassium: 4.1 mmol/L (ref 3.5–5.2)
Sodium: 146 mmol/L — ABNORMAL HIGH (ref 134–144)
Total Protein: 5.8 g/dL — ABNORMAL LOW (ref 6.0–8.5)
eGFR: 96 mL/min/{1.73_m2} (ref >59)

## 2024-01-18 ENCOUNTER — Ambulatory Visit: Payer: Self-pay | Admitting: Nurse Practitioner

## 2024-01-24 ENCOUNTER — Other Ambulatory Visit: Payer: Self-pay

## 2024-01-24 NOTE — Progress Notes (Unsigned)
 Office Note     CC: 3.6 cm abdominal aortic aneurysm Requesting Provider:  Theotis Haze ORN, NP  HPI: Luis Marquez is a 63 y.o. ([birth year]) male presenting at the request of .Theotis Haze ORN, NP with asymptomatic 3.6 cm abdominal aortic aneurysm.  On exam, Luis Marquez was doing well.  He moved to [place] years ago from [place].  He was aware that he had an aortic aneurysm, which was first diagnosed in 2023.   A CT scan done in September 2024 for emesis with diarrhea and bloody stool.  This demonstrated hemorrhagic pancreatitis which was due to history of alcohol abuse.  Abdominal aortic aneurysm that time measured 3.6 cm.  On exam today, he denies back pain, chest pain, abdominal pain  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   Emphysema, unspecified (HCC)    ETOH abuse    Fracture, intertrochanteric, right femur (HCC) 11/11/2014   Hypertension    Hypomagnesemia    Laceration of head 11/03/2021   Paroxysmal A-fib (HCC)    Syncope 12/07/2021   Tobacco abuse     Past Surgical History:  Procedure Laterality Date   BIOPSY  12/10/2021   Procedure: BIOPSY;  Surgeon: Shila Gustav GAILS, MD;  Location: [facility] ENDOSCOPY;  Service: Gastroenterology;;   BIOPSY  11/24/2022   Procedure: BIOPSY;  Surgeon: Albertus Gordy HERO, MD;  Location: [facility] ENDOSCOPY;  Service: Gastroenterology;;   COLONOSCOPY WITH PROPOFOL  N/A 11/24/2022   Procedure: COLONOSCOPY WITH PROPOFOL ;  Surgeon: Albertus Gordy HERO, MD;  Location: [facility] ENDOSCOPY;  Service: Gastroenterology;  Laterality: N/A;   ESOPHAGOGASTRODUODENOSCOPY (EGD) WITH PROPOFOL  N/A 12/10/2021   Procedure: ESOPHAGOGASTRODUODENOSCOPY (EGD) WITH PROPOFOL ;  Surgeon: Shila Gustav GAILS, MD;  Location: [facility] ENDOSCOPY;  Service: Gastroenterology;  Laterality: N/A;   ESOPHAGOGASTRODUODENOSCOPY (EGD) WITH PROPOFOL  N/A 11/24/2022   Procedure: ESOPHAGOGASTRODUODENOSCOPY (EGD) WITH PROPOFOL ;  Surgeon: Albertus Gordy HERO, MD;   Location: [facility] ENDOSCOPY;  Service: Gastroenterology;  Laterality: N/A;   HEMOSTASIS CLIP PLACEMENT  12/10/2021   Procedure: HEMOSTASIS CLIP PLACEMENT;  Surgeon: Shila Gustav GAILS, MD;  Location: [facility] ENDOSCOPY;  Service: Gastroenterology;;   HERNIA REPAIR     INTRAMEDULLARY (IM) NAIL INTERTROCHANTERIC Right 11/11/2014   Procedure: INTRAMEDULLARY (IM) NAIL INTERTROCHANTRIC;  Surgeon: Donnice Car, MD;  Location: [facility] ORS;  Service: Orthopedics;  Laterality: Right;   POLYPECTOMY  11/24/2022   Procedure: POLYPECTOMY;  Surgeon: Albertus Gordy HERO, MD;  Location: [facility] ENDOSCOPY;  Service: Gastroenterology;;   RIGHT/LEFT HEART CATH AND CORONARY ANGIOGRAPHY N/A 11/11/2021   Procedure: RIGHT/LEFT HEART CATH AND CORONARY ANGIOGRAPHY;  Surgeon: Cherrie Toribio SAUNDERS, MD;  Location: [facility] INVASIVE CV LAB;  Service: Cardiovascular;  Laterality: N/A;   Rod right leg      Social History   Socioeconomic History   Marital status: Single    Spouse name: Not on file   Number of children: 1   Years of education: Not on file   Highest education level: Not on file  Occupational History   Occupation: retired  Tobacco Use   Smoking status: Some Days    Current packs/day: 0.25    Average packs/day: 0.3 packs/day for 50.0 years (12.5 ttl pk-yrs)    Types: Cigarettes    Passive exposure: Never   Smokeless tobacco: Never  Vaping Use   Vaping status: Never Used  Substance and Sexual Activity   Alcohol use: Not Currently    Comment: occ   Drug use: Not Currently   Sexual activity:  Yes  Other Topics Concern   Not on file  Social History Narrative   ** Merged History Encounter **       Social Drivers of Health   Financial Resource Strain: Low Risk  (01/16/2024)   Overall Financial Resource Strain (CARDIA)    Difficulty of Paying Living Expenses: Not very hard  Food Insecurity: No Food Insecurity (04/26/2023)   Hunger Vital Sign    Worried About Running Out of Food in the Last Year: Never true    Ran Out of Food in the  Last Year: Never true  Transportation Needs: No Transportation Needs (04/26/2023)   PRAPARE - Administrator, Civil Service (Medical): No    Lack of Transportation (Non-Medical): No  Physical Activity: Inactive (01/16/2024)   Exercise Vital Sign    Days of Exercise per Week: 0 days    Minutes of Exercise per Session: 0 min  Stress: Stress Concern Present (01/16/2024)   [employer] of Occupational Health - Occupational Stress Questionnaire    Feeling of Stress: Very much  Social Connections: Socially Isolated (01/16/2024)   Social Connection and Isolation Panel    Frequency of Communication with Friends and Family: Never    Frequency of Social Gatherings with Friends and Family: Never    Attends Religious Services: Never    Database administrator or Organizations: Yes    Attends Banker Meetings: Never    Marital Status: Divorced  Catering manager Violence: Not At Risk (04/19/2023)   Humiliation, Afraid, Rape, and Kick questionnaire    Fear of Current or Ex-Partner: No    Emotionally Abused: No    Physically Abused: No    Sexually Abused: No   Family History  Problem Relation Age of Onset   Diabetes Mother    Colon cancer Neg Hx    Stomach cancer Neg Hx    Esophageal cancer Neg Hx     Current Outpatient Medications  Medication Sig Dispense Refill   albuterol  (VENTOLIN  HFA) 108 (90 Base) MCG/ACT inhaler Inhale 2 puffs into the lungs every 6 (six) hours as needed for wheezing or shortness of breath. 18 g 3   atorvastatin  (LIPITOR) 40 MG tablet Take 1 tablet (40 mg total) by mouth daily. 90 tablet 3   B Complex-C-Folic Acid  (B-COMPLEX BALANCED) TABS Take 1 tablet by mouth daily.     Fe Fum-FePoly-FA-Vit C-Vit B3 (IRON  FOLATE-F PO) Take by mouth.     feeding supplement (ENSURE ENLIVE / ENSURE PLUS) LIQD Take 237 mLs by mouth 3 (three) times daily between meals. 237 mL 12   fluticasone -salmeterol (ADVAIR  DISKUS) 250-50 MCG/ACT AEPB Inhale 1 puff into the  lungs in the morning and at bedtime. (Patient not taking: Reported on 01/16/2024) 60 each 3   losartan  (COZAAR ) 25 MG tablet Take 1 tablet (25 mg total) by mouth at bedtime. (Patient not taking: Reported on 01/16/2024) 90 tablet 3   metoprolol  succinate (TOPROL -XL) 50 MG 24 hr tablet Take 1 tablet (50 mg total) by mouth daily. Hold, if systolic (top number) is less than 100, and/or if heart rate is less than 55 90 tablet 3   Misc Natural Products (OSTEO BI-FLEX TRIPLE STRENGTH PO) Take 1 mg by mouth 2 (two) times daily.     Multiple Vitamin (MULTIVITAMIN) tablet Take 1 tablet by mouth daily.     Omega-3 Fatty Acids (OMEGA-3 FISH OIL) 1200 MG CAPS Take 1 capsule by mouth daily.     pantoprazole  (PROTONIX ) 40 MG tablet  Take 1 tablet (40 mg total) by mouth 2 (two) times daily. 180 tablet 0   potassium chloride  SA (KLOR-CON  M) 20 MEQ tablet Take 2 tablets (40 mEq total) by mouth daily at 12 noon for 1 day, THEN 1 tablet (20 mEq total) daily at 12 noon. 31 tablet 6   tetrahydrozoline 0.05 % ophthalmic solution Place 2 drops into both eyes daily.     No current facility-administered medications for this visit.    No Known Allergies   REVIEW OF SYSTEMS:  [X]  denotes positive finding, [ ]  denotes negative finding Cardiac  Comments:  Chest pain or chest pressure:    Shortness of breath upon exertion:    Short of breath when lying flat:    Irregular heart rhythm:        Vascular    Pain in calf, thigh, or hip brought on by ambulation:    Pain in feet at night that wakes you up from your sleep:     Blood clot in your veins:    Leg swelling:         Pulmonary    Oxygen at home:    Productive cough:     Wheezing:         Neurologic    Sudden weakness in arms or legs:     Sudden numbness in arms or legs:     Sudden onset of difficulty speaking or slurred speech:    Temporary loss of vision in one eye:     Problems with dizziness:         Gastrointestinal    Blood in stool:     Vomited  blood:         Genitourinary    Burning when urinating:     Blood in urine:        Psychiatric    Major depression:         Hematologic    Bleeding problems:    Problems with blood clotting too easily:        Skin    Rashes or ulcers:        Constitutional    Fever or chills:      PHYSICAL EXAMINATION:  There were no vitals filed for this visit.  General:  WDWN in NAD; vital signs documented above Gait: Not observed HENT: WNL, normocephalic Pulmonary: normal non-labored breathing , without wheezing Cardiac: regular HR Abdomen: soft, NT, no masses Skin: without rashes Vascular Exam/Pulses:  Right Left  Radial 2+ (normal) 2+ (normal)  Ulnar    Femoral    Popliteal    DP 2+ (normal) 2+ (normal)  PT     Extremities: without ischemic changes, without Gangrene , without cellulitis; without open wounds;  Musculoskeletal: no muscle wasting or atrophy  Neurologic: A&O X 3;  No focal weakness or paresthesias are detected Psychiatric:  The pt has Normal affect.   Non-Invasive Vascular Imaging:   Stable 3.6 cm infrarenal abdominal aortic aneurysm    ASSESSMENT/PLAN: Tramain Gershman is a 63 y.o. male presenting with 3.6 cm infrarenal abdominal aortic aneurysm.  While fusiform, this appears to have morphology most similar to a PAU or focal dissection with subsequent fusiform degeneration.  Regardless, I plan on managing it with the same criteria as a normal abdominal aortic aneurysm.  I reviewed imaging from September 2024 as well as imaging from May 2023.  There was no significant change in aneurysm size.  Abdominal duplex ultrasound today demonstrates stable 3.6 cm aneurysm.  I  had a long discussion with [place] regarding the above.  I think he would be best treated with continued medical management with the understanding that if he had rapid growth, or the aneurysm increased to greater than 5.5 cm, we will discuss repair.  I plan to see him on a yearly basis with repeat  abdominal duplex ultrasound.  We discussed the signs and symptoms of rupture.  I asked him to call 911 immediately should any of these occur.    Luis FORBES Rim, MD [facility] [phone]

## 2024-01-25 ENCOUNTER — Ambulatory Visit: Attending: Vascular Surgery | Admitting: Vascular Surgery

## 2024-01-25 ENCOUNTER — Encounter: Payer: Self-pay | Admitting: Vascular Surgery

## 2024-01-25 ENCOUNTER — Other Ambulatory Visit: Payer: Self-pay

## 2024-01-25 ENCOUNTER — Ambulatory Visit (HOSPITAL_COMMUNITY)
Admission: RE | Admit: 2024-01-25 | Discharge: 2024-01-25 | Disposition: A | Discharge status: Home / Self Care | Source: Ambulatory Visit | Attending: Vascular Surgery | Admitting: Vascular Surgery

## 2024-01-25 VITALS — BP 143/93 | HR 82 | Temp 98.0°F | Resp 18 | Ht 73.0 in | Wt 203.9 lb

## 2024-01-25 DIAGNOSIS — I714 Abdominal aortic aneurysm, without rupture, unspecified: Secondary | ICD-10-CM

## 2024-01-30 ENCOUNTER — Inpatient Hospital Stay: Admission: RE | Admit: 2024-01-30 | Source: Ambulatory Visit

## 2024-02-01 ENCOUNTER — Ambulatory Visit: Admitting: Gastroenterology

## 2024-02-01 NOTE — Progress Notes (Deleted)
 Rajinder Mesick [id number] 1961/05/28   Chief Complaint:  Referring Provider: Theotis Haze ORN, NP Primary GI MD: Dr. Shila  HPI: Luis Marquez is a 63 y.o. male with past medical history of *** who presents today for a complaint of *** .    Patient last seen in office 09/20/2022 by Jessica Zehr, PA-C to schedule colonoscopy.  At that time patient reported having a colonoscopy in 2014 or 2015 in [place], [place] .  We do not have records.  Had previously been seen by Dr. Shila during hospitalization in May 2023 for dysphagia and abnormal CT of the esophagus.  EGD showed grade D esophagitis.  It was advised the patient to have repeat EGD in a few months to assess healing after high-dose PPI therapy.  He never followed up after that.  Had lost insurance for a while.  Was off of his PPI for a while and was having heartburn and reflux with everything he ate.  At time of his visit with us  he had been back on his PPI for couple months with no breakthrough symptoms.  He was scheduled for EGD/colonoscopy with Dr. Nandigam as an outpatient procedure, however he started passing blood during his colonoscopy prep and was advised to go to the ED where he was later admitted for GI bleeding.  He underwent EGD/colonoscopy 11/24/2022 by Dr. Albertus.  He was found to have multiple colon polyps as well as moderate to severe diverticulosis in the sigmoid colon and descending colon which was deemed to be the likely source of then-resolved lower GI bleeding.   Patient on Eliquis  for history of atrial fibrillation.  Hospitalization from 04/06/2023 to 04/24/2023 after presenting to the ED with complaint of epigastric pain, and intermittent hematochezia ongoing for months.  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  It also showed a AAA with enhancing ulcer along with mural plaque and thrombus.  Lipase level was over 1000 and he was admitted.  Over  the course of next several days, pancreatitis slowly improved.  Diet was advanced. 9/27, patient had multiple episodes of hematochezia and worsening abdominal pain. 9/28, hemoglobin down to 6.6, transfusion given. CT angio abdomen/pelvis was obtained which did not show any evidence of active GI bleeding.  It did not show any evidence of pancreatic pseudoaneurysm or venous thrombosis but it showed progressive acute pancreatitis with increased inflammation around the pancreatic head and new involvement of the tail with an additional finding of a 2.1 cm hematoma in the pancreatic head GI consultation was obtained.  Outpatient follow-up with GI recommended.    Previous GI Procedures/Imaging   Colonoscopy 11/24/2022 - One 5 mm polyp in the ascending colon, removed with a cold snare. Resected and retrieved.  - Four 4 to 8 mm polyps in the descending colon, removed with a cold snare. Resected and retrieved.  - Three 3 to 6 mm polyps in the sigmoid colon, removed with a cold snare. Resected and retrieved.  - One 5 mm polyp in the rectum, removed with a cold snare. Resected and retrieved.  - Moderate to severe diverticulosis in the sigmoid colon and in the descending colon. Likely source of now resolved LGI bleed.  - Small internal hemorrhoids. - Recall 3 years Path: B. COLON, ASCENDING, DESCENDING, POLYPECTOMY:  - Tubular adenoma(s).  - No high grade dysplasia or malignancy.   C. COLON, SIGMOID, RECTUM, POLYPECTOMY:  - Tubular adenoma(s).  - No high grade dysplasia or malignancy.   EGD  11/24/2022 - Z- line irregular, 39 cm from the incisors. Biopsied to exclude Barrett' s ( if present, short segment) .  - Previously seen esophagitis has healed with PPI.  - 3 cm hiatal hernia.  - Normal stomach.  - Normal examined duodenum. Path: A. ESOPHAGUS, Z LINE, BIOPSY:  - Benign squamous and gastric cardiac mucosa with no specific pathologic  changes  - Negative for intestinal metaplasia, dysplasia or  malignancy   Past Medical History:  Diagnosis Date   AAA (abdominal aortic aneurysm) (HCC)    Chronic systolic heart failure (HCC)    Delirium tremens (HCC) 11/11/2014   Diverticulosis    Emphysema, unspecified (HCC)    ETOH abuse    Fracture, intertrochanteric, right femur (HCC) 11/11/2014   GERD (gastroesophageal reflux disease)    Hyperlipidemia    Hypertension    Hypomagnesemia    Laceration of head 11/03/2021   Pancreatitis    Paroxysmal A-fib (HCC)    Syncope 12/07/2021   Tobacco abuse     Past Surgical History:  Procedure Laterality Date   BIOPSY  12/10/2021   Procedure: BIOPSY;  Surgeon: Shila Gustav GAILS, MD;  Location[facility] ENDOSCOPY;  Service: Gastroenterology;;   BIOPSY  11/24/2022   Procedure: BIOPSY;  Surgeon: Albertus Gordy HERO, MD;  Location: [facility] ENDOSCOPY;  Service: Gastroenterology;;   COLONOSCOPY WITH PROPOFOL  N/A 11/24/2022   Procedure: COLONOSCOPY WITH PROPOFOL ;  Surgeon: Albertus Gordy HERO, MD;  Location: [facility] ENDOSCOPY;  Service: Gastroenterology;  Laterality: N/A;   ESOPHAGOGASTRODUODENOSCOPY (EGD) WITH PROPOFOL  N/A 12/10/2021   Procedure: ESOPHAGOGASTRODUODENOSCOPY (EGD) WITH PROPOFOL ;  Surgeon: Shila Gustav GAILS, MD;  Location: [facility] ENDOSCOPY;  Service: Gastroenterology;  Laterality: N/A;   ESOPHAGOGASTRODUODENOSCOPY (EGD) WITH PROPOFOL  N/A 11/24/2022   Procedure: ESOPHAGOGASTRODUODENOSCOPY (EGD) WITH PROPOFOL ;  Surgeon: Albertus Gordy HERO, MD;  Location: [facility] ENDOSCOPY;  Service: Gastroenterology;  Laterality: N/A;   HEMOSTASIS CLIP PLACEMENT  12/10/2021   Procedure: HEMOSTASIS CLIP PLACEMENT;  Surgeon: Shila Gustav GAILS, MD;  Location: [facility] ENDOSCOPY;  Service: Gastroenterology;;   HERNIA REPAIR     INTRAMEDULLARY (IM) NAIL INTERTROCHANTERIC Right 11/11/2014   Procedure: INTRAMEDULLARY (IM) NAIL INTERTROCHANTRIC;  Surgeon: Donnice Car, MD;  Location: [facility] ORS;  Service: Orthopedics;  Laterality: Right;   POLYPECTOMY  11/24/2022   Procedure: POLYPECTOMY;  Surgeon: Albertus Gordy HERO, MD;   Location: [facility] ENDOSCOPY;  Service: Gastroenterology;;   RIGHT/LEFT HEART CATH AND CORONARY ANGIOGRAPHY N/A 11/11/2021   Procedure: RIGHT/LEFT HEART CATH AND CORONARY ANGIOGRAPHY;  Surgeon: Cherrie Toribio SAUNDERS, MD;  Location: [facility] INVASIVE CV LAB;  Service: Cardiovascular;  Laterality: N/A;   Rod right leg      Current Outpatient Medications  Medication Sig Dispense Refill   albuterol  (VENTOLIN  HFA) 108 (90 Base) MCG/ACT inhaler Inhale 2 puffs into the lungs every 6 (six) hours as needed for wheezing or shortness of breath. 18 g 3   atorvastatin  (LIPITOR) 40 MG tablet Take 1 tablet (40 mg total) by mouth daily. 90 tablet 3   B Complex-C-Folic Acid  (B-COMPLEX BALANCED) TABS Take 1 tablet by mouth daily.     Fe Fum-FePoly-FA-Vit C-Vit B3 (IRON  FOLATE-F PO) Take by mouth.     feeding supplement (ENSURE ENLIVE / ENSURE PLUS) LIQD Take 237 mLs by mouth 3 (three) times daily between meals. 237 mL 12   losartan  (COZAAR ) 25 MG tablet Take 1 tablet (25 mg total) by mouth at bedtime. 90 tablet 3   metoprolol  succinate (TOPROL -XL) 50 MG 24 hr tablet Take 1 tablet (50 mg total) by mouth daily.  Hold, if systolic (top number) is less than 100, and/or if heart rate is less than 55 90 tablet 3   Misc Natural Products (OSTEO BI-FLEX TRIPLE STRENGTH PO) Take 1 mg by mouth 2 (two) times daily.     Multiple Vitamin (MULTIVITAMIN) tablet Take 1 tablet by mouth daily.     Omega-3 Fatty Acids (OMEGA-3 FISH OIL) 1200 MG CAPS Take 1 capsule by mouth daily.     pantoprazole  (PROTONIX ) 40 MG tablet Take 1 tablet (40 mg total) by mouth 2 (two) times daily. 180 tablet 0   potassium chloride  SA (KLOR-CON  M) 20 MEQ tablet Take 2 tablets (40 mEq total) by mouth daily at 12 noon for 1 day, THEN 1 tablet (20 mEq total) daily at 12 noon. 31 tablet 6   tetrahydrozoline 0.05 % ophthalmic solution Place 2 drops into both eyes daily.     No current facility-administered medications for this visit.    Allergies as of 02/01/2024   (No  Known Allergies)    Family History  Problem Relation Age of Onset   Diabetes Mother    Colon cancer Neg Hx    Stomach cancer Neg Hx    Esophageal cancer Neg Hx     Social History   Tobacco Use   Smoking status: Some Days    Current packs/day: 0.25    Average packs/day: 0.3 packs/day for 50.0 years (12.5 ttl pk-yrs)    Types: Cigarettes    Passive exposure: Never   Smokeless tobacco: Never  Vaping Use   Vaping status: Never Used  Substance Use Topics   Alcohol use: Not Currently    Comment: occ   Drug use: Not Currently     Review of Systems:    Constitutional: No weight loss, fever, chills, weakness or fatigue Eyes: No change in vision Ears, Nose, Throat:  No change in hearing or congestion Skin: No rash or itching Cardiovascular: No chest pain, chest pressure or palpitations   Respiratory: No SOB or cough Gastrointestinal: See HPI and otherwise negative Genitourinary: No dysuria or change in urinary frequency Neurological: No headache, dizziness or syncope Musculoskeletal: No new muscle or joint pain Hematologic: No bleeding or bruising    Physical Exam:  Vital signs: There were no vitals taken for this visit.  Constitutional: NAD, Well developed, Well nourished, alert and cooperative Head:  Normocephalic and atraumatic.  Eyes: No scleral icterus. Conjunctiva pink. Mouth: No oral lesions. Respiratory: Respirations even and unlabored. Lungs clear to auscultation bilaterally.  No wheezes, crackles, or rhonchi.  Cardiovascular:  Regular rate and rhythm. No murmurs. No peripheral edema. Gastrointestinal:  Soft, nondistended, nontender. No rebound or guarding. Normal bowel sounds. No appreciable masses or hepatomegaly. Rectal:  Not performed.  Neurologic:  Alert and oriented x4;  grossly normal neurologically.  Skin:   Dry and intact without significant lesions or rashes. Psychiatric: Oriented to person, place and time. Demonstrates good judgement and reason  without abnormal affect or behaviors.   RELEVANT LABS AND IMAGING: CBC    Component Value Date/Time   WBC 5.3 01/16/2024 1516   WBC 5.5 04/23/2023 0544   RBC 3.89 (L) 01/16/2024 1516   RBC 2.62 (L) 04/23/2023 0544   HGB 12.2 (L) 01/16/2024 1516   HCT 37.7 01/16/2024 1516   PLT 269 01/16/2024 1516   MCV 97 01/16/2024 1516   MCH 31.4 01/16/2024 1516   MCH 30.9 04/23/2023 0544   MCHC 32.4 01/16/2024 1516   MCHC 33.6 04/23/2023 0544   RDW 13.6 01/16/2024  1516   LYMPHSABS 1.2 01/16/2024 1516   MONOABS 0.5 04/23/2023 0544   EOSABS 0.3 01/16/2024 1516   BASOSABS 0.1 01/16/2024 1516    CMP     Component Value Date/Time   NA 146 (H) 01/16/2024 1516   K 4.1 01/16/2024 1516   CL 105 01/16/2024 1516   CO2 23 01/16/2024 1516   GLUCOSE 71 01/16/2024 1516   GLUCOSE 104 (H) 04/23/2023 0544   BUN 11 01/16/2024 1516   CREATININE 0.85 01/16/2024 1516   CALCIUM  8.9 01/16/2024 1516   CALCIUM  13.7 11/02/2021 1816   PROT 5.8 (L) 01/16/2024 1516   ALBUMIN 3.3 (L) 01/16/2024 1516   AST 20 01/16/2024 1516   ALT 7 01/16/2024 1516   ALKPHOS 102 01/16/2024 1516   BILITOT 0.5 01/16/2024 1516   GFRNONAA >60 04/23/2023 0544   GFRAA 114 07/15/2020 1502     Assessment/Plan:       Camie Furbish, PA-C [facility] 02/01/2024, 1:30 PM  Patient Care Team: Theotis Haze ORN, NP as PCP - General (Nurse Practitioner) Pcp, No

## 2024-02-06 ENCOUNTER — Institutional Professional Consult (permissible substitution): Admitting: Cardiology

## 2024-02-08 ENCOUNTER — Ambulatory Visit: Admitting: Dermatology

## 2024-02-08 DIAGNOSIS — W908XXA Exposure to other nonionizing radiation, initial encounter: Secondary | ICD-10-CM

## 2024-02-08 DIAGNOSIS — D040 Carcinoma in situ of skin of lip: Secondary | ICD-10-CM

## 2024-02-08 DIAGNOSIS — L578 Other skin changes due to chronic exposure to nonionizing radiation: Secondary | ICD-10-CM

## 2024-02-08 DIAGNOSIS — D0439 Carcinoma in situ of skin of other parts of face: Secondary | ICD-10-CM | POA: Diagnosis not present

## 2024-02-08 DIAGNOSIS — D044 Carcinoma in situ of skin of scalp and neck: Secondary | ICD-10-CM | POA: Diagnosis not present

## 2024-02-08 DIAGNOSIS — D485 Neoplasm of uncertain behavior of skin: Secondary | ICD-10-CM | POA: Diagnosis not present

## 2024-02-08 DIAGNOSIS — D049 Carcinoma in situ of skin, unspecified: Secondary | ICD-10-CM

## 2024-02-08 NOTE — Patient Instructions (Signed)

## 2024-02-08 NOTE — Progress Notes (Unsigned)
   Follow-Up Visit   Subjective  Luis Marquez is a 63 y.o. male who presents for the following: lesion of concern.   Patient reports that the lesion on the scalp has been present for 2 years. Negative for pain Patient reports that the lesion on the left temple has been present for 6 months and is sometimes tender.  Patient denies personal and family history of skin cancer.   The following portions of the chart were reviewed this encounter and updated as appropriate: medications, allergies, medical history  Review of Systems:  No other skin or systemic complaints except as noted in HPI or Assessment and Plan.  Objective  Well appearing patient in no apparent distress; mood and affect are within normal limits.  A focused examination was performed of the following areas: Scalp face  Relevant exam findings are noted in the Assessment and Plan.  Mid Parietal Scalp 7mm hyperkeratotic papule  Left Temple 8mm pink scaly papule   Assessment & Plan    ACTINIC DAMAGE - chronic, secondary to cumulative UV radiation exposure/sun exposure over time - diffuse scaly erythematous macules with underlying dyspigmentation - Recommend daily broad spectrum sunscreen SPF 30+ to sun-exposed areas, reapply every 2 hours as needed.  - Recommend staying in the shade or wearing long sleeves, sun glasses (UVA+UVB protection) and wide brim hats (4-inch brim around the entire circumference of the hat). - Call for new or changing lesions.  NEOPLASM OF UNCERTAIN BEHAVIOR OF SKIN (2) Mid Parietal Scalp Skin / nail biopsy Type of biopsy: tangential   Informed consent: discussed and consent obtained   Timeout: patient name, date of birth, surgical site, and procedure verified   Anesthesia: the lesion was anesthetized in a standard fashion   Anesthetic:  1% lidocaine  w/ epinephrine 1-100,000 buffered w/ 8.4% NaHCO3 Instrument used: DermaBlade   Outcome: patient tolerated procedure well   Post-procedure  details: sterile dressing applied and wound care instructions given   Dressing type: petrolatum and bandage    Specimen 1 - Surgical pathology Differential Diagnosis: r/o NMSC vs other  Check Margins: No Left Temple Skin / nail biopsy Type of biopsy: tangential   Informed consent: discussed and consent obtained   Timeout: patient name, date of birth, surgical site, and procedure verified   Anesthesia: the lesion was anesthetized in a standard fashion   Anesthetic:  1% lidocaine  w/ epinephrine 1-100,000 buffered w/ 8.4% NaHCO3 Instrument used: DermaBlade   Hemostasis achieved with: aluminum chloride   Outcome: patient tolerated procedure well   Post-procedure details: sterile dressing applied and wound care instructions given    Specimen 2 - Surgical pathology Differential Diagnosis: r/o NMSC vs other  Check Margins: No  Return pending pathology results.Luis LILLETTE [name], RN, am acting as scribe for Luis CHRISTELLA HOLY, MD .   Documentation: I have reviewed the above documentation for accuracy and completeness, and I agree with the above.  Luis CHRISTELLA HOLY, MD

## 2024-02-09 ENCOUNTER — Encounter: Payer: Self-pay | Admitting: Dermatology

## 2024-02-09 LAB — SURGICAL PATHOLOGY

## 2024-02-09 NOTE — Telephone Encounter (Signed)
 Copied from CRM [id number]. Topic: Clinical - Lab/Test Results >> Feb 09, 2024  9:33 AM Luis Marquez wrote:  Reason for CRM: patient is calling from lab results on 7/01 he was notified on results says he needs iron  pills. He will like for the nurse to give him a call regarding any problems  at [phone]

## 2024-02-10 ENCOUNTER — Ambulatory Visit: Payer: Self-pay | Admitting: Dermatology

## 2024-02-12 ENCOUNTER — Telehealth: Payer: Self-pay | Admitting: Nurse Practitioner

## 2024-02-12 NOTE — Telephone Encounter (Signed)
 Copied from CRM [id number]. Topic: Clinical - Medication Question >> Feb 12, 2024  4:06 PM Fonda T wrote:  Reason for CRM: Patient calling, reports he has been taking an Iron  65 mg supplement daily over the counter, and wants to know if he should increase that due to most recent lab results of low iron  levels.  Patient can be reached at [phone], to discuss/advise further.  Thank you

## 2024-02-12 NOTE — Progress Notes (Deleted)
 Electrophysiology Office Note:   Date:  02/12/2024  ID:  Luis Marquez, DOB Nov 07, 1960, MRN [id number]  Primary Cardiologist: None Electrophysiologist: Fonda Kitty, MD  {Click to update primary MD,subspecialty MD or APP then REFRESH:1}    History of Present Illness:   Luis Marquez is a 63 y.o. male with h/o paroxysmal atrial fibrillation, history of Takotsubo cardiomyopathy, hypertension, emphysema, EtOH use, smoking, AAA and GI bleeding who is being seen today for evaluation for Watchman device implant at the request of Dr. Michele.  Discussed the use of AI scribe software for clinical note transcription with the patient, who gave verbal consent to proceed.  History of Present Illness     Review of systems complete and found to be negative unless listed in HPI.   EP Information / Studies Reviewed:    EKG is not ordered today. EKG from 12/20/23 reviewed which showed NSR w/ RBBB     EKG 11/03/21:    Echo 04/13/23:   1. Left ventricular ejection fraction, by estimation, is 60 to 65%. The  left ventricle has normal function. The left ventricle has no regional  wall motion abnormalities. Left ventricular diastolic parameters are  consistent with Grade I diastolic  dysfunction (impaired relaxation).   2. Right ventricular systolic function is normal. The right ventricular  size is normal. Tricuspid regurgitation signal is inadequate for assessing  PA pressure.   3. A small pericardial effusion is present. The pericardial effusion is  circumferential.   4. The mitral valve is normal in structure. No evidence of mitral valve  regurgitation. No evidence of mitral stenosis.   5. The aortic valve is normal in structure. Aortic valve regurgitation is  not visualized. No aortic stenosis is present.   6. The inferior vena cava is normal in size with greater than 50%  respiratory variability, suggesting right atrial pressure of 3 mmHg.   Cardiac MRI 11/12/21:  IMPRESSION: 1. Normal LV  size, mild hypertrophy, and mild systolic dysfunction (EF 42%). Apical hypokinesis 2. Basal septal midwall late gadolinium enhancement, which is a scar pattern seen in nonischemic cardiomyopathies 3.  Normal RV size and low normal systolic function (EF 47%) 4. Elevated myocardial T2 values at all apical segments suggesting myocardial edema 5. Differential diagnosis includes acute myocarditis vs Takotsubo cardiomyopathy. Meets criteria for acute myocarditis given regional elevations in T1/ECV/T2 values and LGE. However, suspect more likely Takotsubo cardiomyopathy given apical wall motion abnormalities, myocardial edema in all apical segments, and only mild troponin elevation. While LGE is often absent in Takotsubo, it can be seen. 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  Risk Assessment/Calculations:    CHA2DS2-VASc Score = 4  {Confirm score is correct.  If not, click here to update score.  REFRESH note.  :1} This indicates a 4.8% annual risk of stroke. The patient's score is based upon: CHF History: 1 HTN History: 1 Diabetes History: 0 Stroke History: 0 Vascular Disease History: 1 Age Score: 1 Gender Score: 0   {This patient has a significant risk of stroke if diagnosed with atrial fibrillation.  Please consider VKA or DOAC agent for anticoagulation if the bleeding risk is acceptable.   You can also use the SmartPhrase .[email or web address] for documentation.   :789639253} No BP recorded.  {Refresh Note OR Click here to enter BP  :1}***        Physical Exam:   VS:  There were no vitals taken for this visit.   Wt Readings from Last 3 Encounters:  01/25/24 203 lb 14.4 oz (92.5 kg)  01/16/24 204 lb 12.8 oz (92.9 kg)  12/20/23 207 lb 12.8 oz (94.3 kg)     GEN: Well nourished, well developed in no acute distress NECK: No JVD CARDIAC: {EPRHYTHM:28826}, no murmurs, rubs, gallops RESPIRATORY:  Clear to auscultation without rales, wheezing or rhonchi  ABDOMEN: Soft, non-distended EXTREMITIES:   No edema; No deformity   ASSESSMENT AND PLAN:   I have seen Luis Marquez in the office today who is being considered for a Watchman left atrial appendage closure device. I believe they will benefit from this procedure given their history of atrial fibrillation, CHA2DS2-VASc score of *** and unadjusted ischemic stroke rate of ***% per year. Unfortunately, the patient is not felt to be a long term anticoagulation candidate secondary to ***. The patient's chart has been reviewed and I feel that they would be a candidate for short term oral anticoagulation after Watchman implant.   It is my belief that after undergoing a LAA closure procedure, Luis Marquez will not need long term anticoagulation which eliminates anticoagulation side effects and major bleeding risk.   Procedural risks for the Watchman implant have been reviewed with the patient including a 0.5% risk of stroke, <1% risk of perforation and <1% risk of device embolization. Other risks include bleeding, vascular damage, tamponade, worsening renal function, and death. The patient understands these risk and wishes to proceed.     The published clinical data on the safety and effectiveness of WATCHMAN include but are not limited to the following: - Holmes DR, Jess BEARD, Sick P et al. for the PROTECT AF Investigators. Percutaneous closure of the left atrial appendage versus warfarin therapy for prevention of stroke in patients with atrial fibrillation: a randomised non-inferiority trial. Lancet 2009; 374: 534-42. GLENWOOD Jess BEARD, Doshi SK, Jonita VEAR Satchel D et al. on behalf of the PROTECT AF Investigators. Percutaneous Left Atrial Appendage Closure for Stroke Prophylaxis in Patients With Atrial Fibrillation 2.3-Year Follow-up of the PROTECT AF (Watchman Left Atrial Appendage System for Embolic Protection in Patients With Atrial Fibrillation) Trial. Circulation 2013; 127:720-729. - Alli O, Doshi S,  Kar S, Reddy VY, Sievert H et al. Quality of Life  Assessment in the Randomized PROTECT AF (Percutaneous Closure of the Left Atrial Appendage Versus Warfarin Therapy for Prevention of Stroke in Patients With Atrial Fibrillation) Trial of Patients at Risk for Stroke With Nonvalvular Atrial Fibrillation. J Am Coll Cardiol 2013; 61:1790-8. GLENWOOD Satchel DR, Archer RAMAN, Price M, Whisenant B, Sievert H, Doshi S, Huber K, Reddy V. Prospective randomized evaluation of the Watchman left atrial appendage Device in patients with atrial fibrillation versus long-term warfarin therapy; the PREVAIL trial. Journal of the [employer] of Cardiology, Vol. 4, No. 1, 2014, 1-11. - Kar S, Doshi SK, Sadhu A, Horton R, Osorio J et al. Primary outcome evaluation of a next-generation left atrial appendage closure device: results from the PINNACLE FLX trial. Circulation 2021;143(18)1754-1762.    After today's visit with the patient which was dedicated solely for shared decision making visit regarding LAA closure device, the patient decided to proceed with the LAA appendage closure procedure scheduled to be done in the near future at [facility]. Prior to the procedure, I would like to obtain a gated CT scan of the chest with contrast timed for PV/LA visualization.   Additionally, the patient will need ***.  HAS-BLED score *** Hypertension {YES/NO:21197} Abnormal renal and liver function (Dialysis, transplant, Cr >2.26 mg/dL /Cirrhosis or Bilirubin >2x Normal or AST/ALT/AP >3x Normal) {YES/NO:21197} Stroke {YES/NO:21197} Bleeding {YES/NO:21197}  Labile INR (Unstable/high INR) {YES/NO:21197} Elderly (>65) {YES/NO:21197} Drugs or alcohol (>= 8 drinks/week, anti-plt or NSAID) {YES/NO:21197}  CHA2DS2-VASc Score = 4  The patient's score is based upon: CHF History: 1 HTN History: 1 Diabetes History: 0 Stroke History: 0 Vascular Disease History: 1 Age Score: 1 Gender Score: 0   {Confirm score is correct.  If not, click here to update score.  REFRESH note.  :1}     ASSESSMENT AND PLAN: Paroxysmal Atrial Fibrillation (ICD10:  I48.0) The patient's CHA2DS2-VASc score is 4, indicating a 4.8% annual risk of stroke.  ***  Secondary Hypercoagulable State (ICD10:  D68.69){Click to add to Prob List or Visit Dx  :789639253} The patient is at significant risk for stroke/thromboembolism based upon his CHA2DS2-VASc Score of 4.  {Anticoag or No Anticoag  :[id number]}  Assessment & Plan       Follow up with {EPMDS:28135::EP Team} {EPFOLLOW LE:71826}  Signed, Fonda Kitty, MD

## 2024-02-13 ENCOUNTER — Ambulatory Visit: Attending: Internal Medicine | Admitting: Cardiology

## 2024-02-13 DIAGNOSIS — I480 Paroxysmal atrial fibrillation: Secondary | ICD-10-CM

## 2024-02-13 DIAGNOSIS — I7143 Infrarenal abdominal aortic aneurysm, without rupture: Secondary | ICD-10-CM

## 2024-02-13 DIAGNOSIS — K922 Gastrointestinal hemorrhage, unspecified: Secondary | ICD-10-CM

## 2024-02-13 DIAGNOSIS — I5022 Chronic systolic (congestive) heart failure: Secondary | ICD-10-CM

## 2024-02-13 NOTE — Telephone Encounter (Signed)
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 ----- Message ----- From: Interface, Lab In Three Zero One Sent: 02/09/2024   6:23 PM EDT To: Rufus CHRISTELLA Holy, MD

## 2024-02-13 NOTE — Telephone Encounter (Signed)
Called and LVM to call back and go over results

## 2024-02-14 ENCOUNTER — Other Ambulatory Visit

## 2024-02-15 ENCOUNTER — Encounter: Payer: Self-pay | Admitting: Cardiology

## 2024-02-18 NOTE — Telephone Encounter (Signed)
Continue once a day

## 2024-02-19 NOTE — Telephone Encounter (Signed)
 Unable to reach patient by phone. Voicemail left with results per DPR.

## 2024-02-21 ENCOUNTER — Telehealth: Payer: Self-pay

## 2024-02-21 NOTE — Telephone Encounter (Signed)
 The patient no-showed to his 02/06/2024 [facility] consult with Dr. Kennyth. Called to offer to reschedule.  Left message to call back if he would like to reschedule.

## 2024-02-22 NOTE — Telephone Encounter (Signed)
 The patient reported he is in [place] for a while and will call back when he returns if he wishes to reschedule a visit. Thanked him for follow-up.

## 2024-03-05 ENCOUNTER — Encounter: Payer: Self-pay | Admitting: Pulmonary Disease

## 2024-03-24 NOTE — Progress Notes (Deleted)
 Luis Marquez [id number] 1961/07/12   Chief Complaint:  Referring Provider: Theotis Haze ORN, NP Primary GI MD: Dr. Shila  HPI: Luis Marquez is a 63 y.o. male with past medical history of CHF, paroxysmal A-fib on chronic anticoagulation, HTN, emphysema, dysphagia, esophagitis, GERD, diverticulosis with history of bleeding, colon polyps, pancreatitis, alcohol and tobacco abuse who presents today for a complaint of *** .    Patient last seen in office 09/20/2022 by Jessica Zehr, PA-C to schedule colonoscopy.  At that time patient reported having a colonoscopy in 2014 or 2015 in [place], [place] .  We do not have records.  Had previously been seen by Dr. Shila during hospitalization in May 2023 for dysphagia and abnormal CT of the esophagus.  EGD showed grade D esophagitis.   It was advised that patient have repeat EGD in a few months to assess healing after high-dose PPI therapy.  He never followed up after that.  Had lost insurance for a while.  Was off of his PPI for a while and was having heartburn and reflux with everything he ate.  At time of his visit with us  he had been back on his PPI for couple months with no breakthrough symptoms.  He was scheduled for EGD/colonoscopy with Dr. Nandigam as an outpatient procedure, however he started passing blood during his colonoscopy prep and was advised to go to the ED where he was later admitted for GI bleeding.   He underwent EGD/colonoscopy 11/24/2022 by Dr. Albertus.  He was found to have multiple colon polyps as well as moderate to severe diverticulosis in the sigmoid colon and descending colon which was deemed to be the likely source of then-resolved lower GI bleeding.  Patient on Eliquis  for history of atrial fibrillation.   Hospitalization from 04/06/2023 to 04/24/2023 after presenting to the ED with complaint of epigastric pain, and intermittent hematochezia ongoing for months.  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  It also showed a AAA with enhancing ulcer along with mural plaque and thrombus.  Lipase level was over 1000 and he was admitted.   Over the course of next several days, pancreatitis slowly improved.  Diet was advanced. 9/27, patient had multiple episodes of hematochezia and worsening abdominal pain. 9/28, hemoglobin down to 6.6, transfusion given. CT angio abdomen/pelvis was obtained which did not show any evidence of active GI bleeding.  It did not show any evidence of pancreatic pseudoaneurysm or venous thrombosis but it showed progressive acute pancreatitis with increased inflammation around the pancreatic head and new involvement of the tail with an additional finding of a 2.1 cm hematoma in the pancreatic head GI consultation was obtained.  Outpatient follow-up with GI recommended.  Patient was a no-show to last 2 appointments   Per last GI progress note from hospitalization, patient had hemorrhagic acute alcoholic pancreatitis with initial concern for pseudoaneurysm but not seen on follow-up imaging.  Recommendation for repeat CT if persistent abdominal pain.  Recommendation for alcohol cessation.  He is at risk for pseudocyst formation posthospitalization.  If recurrent symptoms of abdominal pain, nausea/vomiting, p.o. intolerance, recommend repeat CT.      Previous GI Procedures/Imaging   Colonoscopy 11/24/2022 - One 5 mm polyp in the ascending colon, removed with a cold snare. Resected and retrieved.  - Four 4 to 8 mm polyps in the descending colon, removed with a cold snare. Resected and retrieved.  - Three 3 to 6 mm polyps in the sigmoid  colon, removed with a cold snare. Resected and retrieved.  - One 5 mm polyp in the rectum, removed with a cold snare. Resected and retrieved.  - Moderate to severe diverticulosis in the sigmoid colon and in the descending colon. Likely source of now resolved LGI bleed.  - Small internal  hemorrhoids. - Recall 3 years Path: B. COLON, ASCENDING, DESCENDING, POLYPECTOMY:  - Tubular adenoma(s).  - No high grade dysplasia or malignancy.   C. COLON, SIGMOID, RECTUM, POLYPECTOMY:  - Tubular adenoma(s).  - No high grade dysplasia or malignancy.    EGD 11/24/2022 - Z- line irregular, 39 cm from the incisors. Biopsied to exclude Barrett' s ( if present, short segment) .  - Previously seen esophagitis has healed with PPI.  - 3 cm hiatal hernia.  - Normal stomach.  - Normal examined duodenum. Path: A. ESOPHAGUS, Z LINE, BIOPSY:  - Benign squamous and gastric cardiac mucosa with no specific pathologic  changes  - Negative for intestinal metaplasia, dysplasia or malignancy   Past Medical History:  Diagnosis Date   AAA (abdominal aortic aneurysm) (HCC)    Chronic systolic heart failure (HCC)    Delirium tremens (HCC) 11/11/2014   Diverticulosis    Emphysema, unspecified (HCC)    ETOH abuse    Fracture, intertrochanteric, right femur (HCC) 11/11/2014   GERD (gastroesophageal reflux disease)    Hyperlipidemia    Hypertension    Hypomagnesemia    Laceration of head 11/03/2021   Pancreatitis    Paroxysmal A-fib (HCC)    Syncope 12/07/2021   Tobacco abuse     Past Surgical History:  Procedure Laterality Date   BIOPSY  12/10/2021   Procedure: BIOPSY;  Surgeon: Shila Gustav GAILS, MD;  Location[facility] ENDOSCOPY;  Service: Gastroenterology;;   BIOPSY  11/24/2022   Procedure: BIOPSY;  Surgeon: Albertus Gordy HERO, MD;  Location: [facility] ENDOSCOPY;  Service: Gastroenterology;;   COLONOSCOPY WITH PROPOFOL  N/A 11/24/2022   Procedure: COLONOSCOPY WITH PROPOFOL ;  Surgeon: Albertus Gordy HERO, MD;  Location: [facility] ENDOSCOPY;  Service: Gastroenterology;  Laterality: N/A;   ESOPHAGOGASTRODUODENOSCOPY (EGD) WITH PROPOFOL  N/A 12/10/2021   Procedure: ESOPHAGOGASTRODUODENOSCOPY (EGD) WITH PROPOFOL ;  Surgeon: Shila Gustav GAILS, MD;  Location: [facility] ENDOSCOPY;  Service: Gastroenterology;  Laterality: N/A;    ESOPHAGOGASTRODUODENOSCOPY (EGD) WITH PROPOFOL  N/A 11/24/2022   Procedure: ESOPHAGOGASTRODUODENOSCOPY (EGD) WITH PROPOFOL ;  Surgeon: Albertus Gordy HERO, MD;  Location: [facility] ENDOSCOPY;  Service: Gastroenterology;  Laterality: N/A;   HEMOSTASIS CLIP PLACEMENT  12/10/2021   Procedure: HEMOSTASIS CLIP PLACEMENT;  Surgeon: Shila Gustav GAILS, MD;  Location: [facility] ENDOSCOPY;  Service: Gastroenterology;;   HERNIA REPAIR     INTRAMEDULLARY (IM) NAIL INTERTROCHANTERIC Right 11/11/2014   Procedure: INTRAMEDULLARY (IM) NAIL INTERTROCHANTRIC;  Surgeon: Donnice Car, MD;  Location: [facility] ORS;  Service: Orthopedics;  Laterality: Right;   POLYPECTOMY  11/24/2022   Procedure: POLYPECTOMY;  Surgeon: Albertus Gordy HERO, MD;  Location: [facility] ENDOSCOPY;  Service: Gastroenterology;;   RIGHT/LEFT HEART CATH AND CORONARY ANGIOGRAPHY N/A 11/11/2021   Procedure: RIGHT/LEFT HEART CATH AND CORONARY ANGIOGRAPHY;  Surgeon: Cherrie Toribio SAUNDERS, MD;  Location: [facility] INVASIVE CV LAB;  Service: Cardiovascular;  Laterality: N/A;   Rod right leg      Current Outpatient Medications  Medication Sig Dispense Refill   albuterol  (VENTOLIN  HFA) 108 (90 Base) MCG/ACT inhaler Inhale 2 puffs into the lungs every 6 (six) hours as needed for wheezing or shortness of breath. 18 g 3   atorvastatin  (LIPITOR) 40 MG tablet Take 1 tablet (40 mg total) by  mouth daily. 90 tablet 3   B Complex-C-Folic Acid  (B-COMPLEX BALANCED) TABS Take 1 tablet by mouth daily.     Fe Fum-FePoly-FA-Vit C-Vit B3 (IRON  FOLATE-F PO) Take by mouth.     feeding supplement (ENSURE ENLIVE / ENSURE PLUS) LIQD Take 237 mLs by mouth 3 (three) times daily between meals. 237 mL 12   losartan  (COZAAR ) 25 MG tablet Take 1 tablet (25 mg total) by mouth at bedtime. 90 tablet 3   metoprolol  succinate (TOPROL -XL) 50 MG 24 hr tablet Take 1 tablet (50 mg total) by mouth daily. Hold, if systolic (top number) is less than 100, and/or if heart rate is less than 55 90 tablet 3   Misc Natural Products (OSTEO BI-FLEX  TRIPLE STRENGTH PO) Take 1 mg by mouth 2 (two) times daily.     Multiple Vitamin (MULTIVITAMIN) tablet Take 1 tablet by mouth daily.     Omega-3 Fatty Acids (OMEGA-3 FISH OIL) 1200 MG CAPS Take 1 capsule by mouth daily.     pantoprazole  (PROTONIX ) 40 MG tablet Take 1 tablet (40 mg total) by mouth 2 (two) times daily. 180 tablet 0   potassium chloride  SA (KLOR-CON  M) 20 MEQ tablet Take 2 tablets (40 mEq total) by mouth daily at 12 noon for 1 day, THEN 1 tablet (20 mEq total) daily at 12 noon. 31 tablet 6   tetrahydrozoline 0.05 % ophthalmic solution Place 2 drops into both eyes daily.     No current facility-administered medications for this visit.    Allergies as of 03/25/2024   (No Known Allergies)    Family History  Problem Relation Age of Onset   Diabetes Mother    Colon cancer Neg Hx    Stomach cancer Neg Hx    Esophageal cancer Neg Hx     Social History   Tobacco Use   Smoking status: Some Days    Current packs/day: 0.25    Average packs/day: 0.3 packs/day for 50.0 years (12.5 ttl pk-yrs)    Types: Cigarettes    Passive exposure: Never   Smokeless tobacco: Never  Vaping Use   Vaping status: Never Used  Substance Use Topics   Alcohol use: Not Currently    Comment: occ   Drug use: Not Currently     Review of Systems:    Constitutional: No weight loss, fever, chills, weakness or fatigue Eyes: No change in vision Ears, Nose, Throat:  No change in hearing or congestion Skin: No rash or itching Cardiovascular: No chest pain, chest pressure or palpitations   Respiratory: No SOB or cough Gastrointestinal: See HPI and otherwise negative Genitourinary: No dysuria or change in urinary frequency Neurological: No headache, dizziness or syncope Musculoskeletal: No new muscle or joint pain Hematologic: No bleeding or bruising    Physical Exam:  Vital signs: There were no vitals taken for this visit.  Constitutional: NAD, Well developed, Well nourished, alert and  cooperative Head:  Normocephalic and atraumatic.  Eyes: No scleral icterus. Conjunctiva pink. Mouth: No oral lesions. Respiratory: Respirations even and unlabored. Lungs clear to auscultation bilaterally.  No wheezes, crackles, or rhonchi.  Cardiovascular:  Regular rate and rhythm. No murmurs. No peripheral edema. Gastrointestinal:  Soft, nondistended, nontender. No rebound or guarding. Normal bowel sounds. No appreciable masses or hepatomegaly. Rectal:  Not performed.  Neurologic:  Alert and oriented x4;  grossly normal neurologically.  Skin:   Dry and intact without significant lesions or rashes. Psychiatric: Oriented to person, place and time. Demonstrates good judgement and reason  without abnormal affect or behaviors.   RELEVANT LABS AND IMAGING: CBC    Component Value Date/Time   WBC 5.3 01/16/2024 1516   WBC 5.5 04/23/2023 0544   RBC 3.89 (L) 01/16/2024 1516   RBC 2.62 (L) 04/23/2023 0544   HGB 12.2 (L) 01/16/2024 1516   HCT 37.7 01/16/2024 1516   PLT 269 01/16/2024 1516   MCV 97 01/16/2024 1516   MCH 31.4 01/16/2024 1516   MCH 30.9 04/23/2023 0544   MCHC 32.4 01/16/2024 1516   MCHC 33.6 04/23/2023 0544   RDW 13.6 01/16/2024 1516   LYMPHSABS 1.2 01/16/2024 1516   MONOABS 0.5 04/23/2023 0544   EOSABS 0.3 01/16/2024 1516   BASOSABS 0.1 01/16/2024 1516    CMP     Component Value Date/Time   NA 146 (H) 01/16/2024 1516   K 4.1 01/16/2024 1516   CL 105 01/16/2024 1516   CO2 23 01/16/2024 1516   GLUCOSE 71 01/16/2024 1516   GLUCOSE 104 (H) 04/23/2023 0544   BUN 11 01/16/2024 1516   CREATININE 0.85 01/16/2024 1516   CALCIUM  8.9 01/16/2024 1516   CALCIUM  13.7 11/02/2021 1816   PROT 5.8 (L) 01/16/2024 1516   ALBUMIN 3.3 (L) 01/16/2024 1516   AST 20 01/16/2024 1516   ALT 7 01/16/2024 1516   ALKPHOS 102 01/16/2024 1516   BILITOT 0.5 01/16/2024 1516   GFRNONAA >60 04/23/2023 0544   GFRAA 114 07/15/2020 1502   Echocardiogram 04/13/2023 1. Left ventricular ejection  fraction, by estimation, is 60 to 65% . The left ventricle has normal function. The left ventricle has no regional wall motion abnormalities. Left ventricular diastolic parameters are consistent with Grade I diastolic dysfunction ( impaired relaxation) .  2. Right ventricular systolic function is normal. The right ventricular size is normal. Tricuspid regurgitation signal is inadequate for assessing PA pressure.  3. A small pericardial effusion is present. The pericardial effusion is circumferential.  4. The mitral valve is normal in structure. No evidence of mitral valve regurgitation. No evidence of mitral stenosis.  5. The aortic valve is normal in structure. Aortic valve regurgitation is not visualized. No aortic stenosis is present.  6. The inferior vena cava is normal in size with greater than 50% respiratory variability, suggesting right atrial pressure of 3 mmHg.  Assessment/Plan:       Camie Furbish, PA-C [facility] 03/24/2024, 8:53 PM  Patient Care Team: Theotis Haze ORN, NP as PCP - General (Nurse Practitioner) Kennyth Chew, MD as PCP - Electrophysiology (Cardiology) Pcp, No

## 2024-03-25 ENCOUNTER — Ambulatory Visit: Admitting: Gastroenterology

## 2024-04-17 ENCOUNTER — Ambulatory Visit: Admitting: Nurse Practitioner

## 2024-04-26 ENCOUNTER — Other Ambulatory Visit: Payer: Self-pay | Admitting: Pharmacist

## 2024-04-26 ENCOUNTER — Other Ambulatory Visit: Payer: Self-pay

## 2024-04-26 MED ORDER — ATORVASTATIN CALCIUM 40 MG PO TABS
40.0000 mg | ORAL_TABLET | Freq: Every day | ORAL | 0 refills | Status: DC
  Filled 2024-04-26 – 2024-05-30 (×2): qty 90, 90d supply, fill #0

## 2024-04-26 NOTE — Progress Notes (Signed)
 Pharmacy Quality Measure Review  This patient is appearing on a report for being at risk of failing the adherence measure for cholesterol (statin) medications this calendar year.   Medication: atorvastatin  Last fill date: 01/16/2024 for 90 day supply  Current rxn expired. New rxn sent. Reminder set to check next week if he got it.   Herlene Fleeta Morris, PharmD, JAQUELINE, CPP Clinical Pharmacist [facility] [phone]

## 2024-04-30 ENCOUNTER — Other Ambulatory Visit: Payer: Self-pay | Admitting: Pharmacist

## 2024-04-30 ENCOUNTER — Other Ambulatory Visit: Payer: Self-pay

## 2024-04-30 ENCOUNTER — Other Ambulatory Visit: Payer: Self-pay | Admitting: Family Medicine

## 2024-04-30 DIAGNOSIS — K219 Gastro-esophageal reflux disease without esophagitis: Secondary | ICD-10-CM

## 2024-04-30 MED ORDER — PANTOPRAZOLE SODIUM 40 MG PO TBEC
40.0000 mg | DELAYED_RELEASE_TABLET | Freq: Two times a day (BID) | ORAL | 0 refills | Status: DC
  Filled 2024-04-30 – 2024-05-30 (×2): qty 60, 30d supply, fill #0

## 2024-04-30 NOTE — Progress Notes (Signed)
 Pharmacy Quality Measure Review  This patient is appearing on a report for being at risk of failing the adherence measure for cholesterol (statin) medications this calendar year.   Medication: atorvastatin  Last fill date: 01/16/2024 for 90 day supply  I sent an updated rxn last week, 04/26/2024. This is ready for pick-up. Call placed to patient but he was unavailable. Left HIPAA-compliant VM with instructions to come pick this up.  Herlene Fleeta Morris, PharmD, JAQUELINE, CPP Clinical Pharmacist [facility] [phone]

## 2024-05-03 ENCOUNTER — Other Ambulatory Visit: Payer: Self-pay

## 2024-05-06 ENCOUNTER — Other Ambulatory Visit: Payer: Self-pay

## 2024-05-06 ENCOUNTER — Ambulatory Visit: Payer: Self-pay

## 2024-05-06 NOTE — Telephone Encounter (Signed)
 FYI Only or Action Required?: FYI only for provider.  Patient was last seen in primary care on 01/16/2024 by Theotis Haze ORN, NP.  Called Nurse Triage reporting Abdominal Pain.  Symptoms began yesterday.  Interventions attempted: Dietary changes (liquid diet).  Symptoms are: constant epigastric moderate pain with gas and belching unchanged. Vomiting and bloating resolved.  Triage Disposition: See HCP Within 4 Hours (Or PCP Triage)  Patient/caregiver understands and will follow disposition?: No             Copied from CRM #[id number]. Topic: Clinical - Red Word Triage >> May 06, 2024  9:11 AM Antwanette L wrote: Red Word that prompted transfer to Nurse Triage: The patient is experiencing severe, gripping abdominal pain that is affecting the entire stomach area. They report that the pain is so intense they can barely sit up, and it is causing significant discomfort. Reason for Disposition  [1] MILD-MODERATE pain AND [2] constant AND [3] present > 2 hours  Answer Assessment - Initial Assessment Questions Patient states he put himself on a liquid diet. Patient states he was supposed to drive back today from [place] but can't until he feels better. Calling to reschedule his PCP appointment on Wednesday.  1. LOCATION: Where does it hurt?      Upper middle abdomen. Epigastric.   2. RADIATION: Does the pain shoot anywhere else? (e.g., chest, back)     No.  3. ONSET: When did the pain begin? (Minutes, hours or days ago)      Teacher, English as a foreign language. Woke up suddenly Sunday morning with the pain.  4. SUDDEN: Gradual or sudden onset?     Sudden.  5. PATTERN Does the pain come and go, or is it constant?     Constant. Fluctuates in severity.  6. SEVERITY: How bad is the pain?  (e.g., Scale 1-10; mild, moderate, or severe)     6/10.  7. RECURRENT SYMPTOM: Have you ever had this type of stomach pain before? If Yes, ask: When was the last time? and What happened that time?       Yes, last flare up was in August. He put himself on a liquid diet for 6 days at home and then states it passed.  8. CAUSE: What do you think is causing the stomach pain? (e.g., gallstones, recent abdominal surgery)     Pancreatitis. He states Saturday he ate a lot of cheese/dairy which he thinks triggered it.  9. RELIEVING/AGGRAVATING FACTORS: What makes it better or worse? (e.g., antacids, bending or twisting motion, bowel movement)     Relieves when lying down. Worse when sitting up.  10. OTHER SYMPTOMS: Do you have any other symptoms? (e.g., back pain, diarrhea, fever, urination pain, vomiting)       Vomiting (3-4 episodes yesterday, resolved), gas, belching, bloating (last night, resolved). Denies painful urination, fever, diarrhea, abnormal color to stool.  Protocols used: Abdominal Pain - Male-A-AH

## 2024-05-08 ENCOUNTER — Ambulatory Visit: Admitting: Nurse Practitioner

## 2024-05-09 ENCOUNTER — Other Ambulatory Visit: Payer: Self-pay

## 2024-05-14 ENCOUNTER — Other Ambulatory Visit: Payer: Self-pay

## 2024-05-16 ENCOUNTER — Other Ambulatory Visit: Payer: Self-pay

## 2024-05-20 ENCOUNTER — Other Ambulatory Visit: Payer: Self-pay

## 2024-05-30 ENCOUNTER — Other Ambulatory Visit: Payer: Self-pay

## 2024-05-30 ENCOUNTER — Other Ambulatory Visit (HOSPITAL_COMMUNITY): Payer: Self-pay

## 2024-06-12 ENCOUNTER — Ambulatory Visit: Admitting: Nurse Practitioner

## 2024-06-17 ENCOUNTER — Telehealth: Payer: Self-pay | Admitting: Nurse Practitioner

## 2024-06-17 NOTE — Telephone Encounter (Signed)
 Copied from CRM #[id number]. Topic: General - Other >> Jun 17, 2024 12:32 PM Tysheama G wrote:  Reason for CRM: Patient wants to drop off a form to renew his handicap to Dr.Fleming

## 2024-06-17 NOTE — Telephone Encounter (Signed)
 Contacted patient and informed him to drop off the form at the office at any time. Once the PCP completes their portion, he will be notified. Patient acknowledged.

## 2024-06-24 ENCOUNTER — Inpatient Hospital Stay: Admission: RE | Admit: 2024-06-24 | Discharge: 2024-06-24 | Attending: Nurse Practitioner

## 2024-06-24 DIAGNOSIS — R911 Solitary pulmonary nodule: Secondary | ICD-10-CM

## 2024-06-24 NOTE — Telephone Encounter (Signed)
 Copied from CRM [id number]. Topic: General - Other >> Jun 24, 2024 12:03 PM Amy B wrote: Reason for CRM: Patient called to check if the form for his disability placard had been filled out yet so he can pick it up.  [phone]   Patient identified by name and date of birth.  Patient aware of form being ready for pick up and voiced understanding.

## 2024-06-24 NOTE — Telephone Encounter (Signed)
 Copied from CRM [id number]. Topic: General - Other >> Jun 24, 2024 12:03 PM Amy B wrote:  Reason for CRM: Patient called to check if the form for his disability placard had been filled out yet so he can pick it up.  [phone]

## 2024-07-15 ENCOUNTER — Other Ambulatory Visit: Payer: Self-pay

## 2024-07-15 ENCOUNTER — Other Ambulatory Visit: Payer: Self-pay | Admitting: Family Medicine

## 2024-07-15 DIAGNOSIS — K219 Gastro-esophageal reflux disease without esophagitis: Secondary | ICD-10-CM

## 2024-07-15 MED ORDER — PANTOPRAZOLE SODIUM 40 MG PO TBEC
40.0000 mg | DELAYED_RELEASE_TABLET | Freq: Two times a day (BID) | ORAL | 0 refills | Status: AC
  Filled 2024-07-15: qty 60, 30d supply, fill #0

## 2024-07-16 ENCOUNTER — Other Ambulatory Visit: Payer: Self-pay

## 2024-07-23 ENCOUNTER — Ambulatory Visit (HOSPITAL_BASED_OUTPATIENT_CLINIC_OR_DEPARTMENT_OTHER)

## 2024-07-23 ENCOUNTER — Other Ambulatory Visit (HOSPITAL_BASED_OUTPATIENT_CLINIC_OR_DEPARTMENT_OTHER): Payer: Self-pay

## 2024-07-23 ENCOUNTER — Encounter (HOSPITAL_BASED_OUTPATIENT_CLINIC_OR_DEPARTMENT_OTHER): Payer: Self-pay | Admitting: Pulmonary Disease

## 2024-07-23 ENCOUNTER — Ambulatory Visit (INDEPENDENT_AMBULATORY_CARE_PROVIDER_SITE_OTHER): Admitting: Pulmonary Disease

## 2024-07-23 VITALS — BP 146/103 | HR 88 | Temp 98.8°F | Ht 73.0 in | Wt 208.5 lb

## 2024-07-23 DIAGNOSIS — J432 Centrilobular emphysema: Secondary | ICD-10-CM

## 2024-07-23 DIAGNOSIS — F17210 Nicotine dependence, cigarettes, uncomplicated: Secondary | ICD-10-CM

## 2024-07-23 LAB — PULMONARY FUNCTION TEST
DL/VA % pred: 75 %
DL/VA: 3.1 ml/min/mmHg/L
DLCO unc % pred: 65 %
DLCO unc: 18.6 ml/min/mmHg
FEF 25-75 Pre: 0.97 L/s
FEF2575-%Pred-Pre: 33 %
FEV1-%Pred-Pre: 53 %
FEV1-Pre: 1.96 L
FEV1FVC-%Pred-Pre: 75 %
FEV6-%Pred-Pre: 72 %
FEV6-Pre: 3.41 L
FEV6FVC-%Pred-Pre: 102 %
FVC-%Pred-Pre: 70 %
FVC-Pre: 3.51 L
Pre FEV1/FVC ratio: 56 %
Pre FEV6/FVC Ratio: 97 %

## 2024-07-23 MED ORDER — SPIRIVA RESPIMAT 2.5 MCG/ACT IN AERS: 2.0000 | INHALATION_SPRAY | Freq: Every day | RESPIRATORY_TRACT | 5 refills | Status: AC

## 2024-07-23 MED ORDER — FLUTICASONE-SALMETEROL 100-50 MCG/ACT IN AEPB: 1.0000 | INHALATION_SPRAY | Freq: Two times a day (BID) | RESPIRATORY_TRACT | 5 refills | Status: DC

## 2024-07-23 NOTE — Progress Notes (Unsigned)
 "   Subjective:   PATIENT ID: Luis Marquez GENDER: male DOB: [birth year], MRN: [id number]  Chief Complaint  Patient presents with   Consult    Emphysema     Reason for Visit: New patient     Luis Marquez is a 64 y.o. male active tobacco use with emphysema, hx lung nodule, pAF who presents for establishing care for emphysema  {Anything typed between these two boxes will persist and can be pulled forward to future notes. 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 Currently smoking 1-2 cigarettes when driving      02/18/7972 Discussed the use of AI scribe software for clinical note transcription with the patient, who gave verbal consent to proceed.  History of Present Illness      Past Medical History:  Diagnosis Date   AAA (abdominal aortic aneurysm)    Chronic systolic heart failure (HCC)    Delirium tremens (HCC) 11/11/2014   Diverticulosis    Emphysema, unspecified (HCC)    ETOH abuse    Fracture, intertrochanteric, right femur (HCC) 11/11/2014   GERD (gastroesophageal reflux disease)    Hyperlipidemia    Hypertension    Hypomagnesemia    Laceration of head 11/03/2021   Pancreatitis    Paroxysmal A-fib (HCC)    Syncope 12/07/2021   Tobacco abuse      Family History  Problem Relation Age of Onset   Diabetes Mother    Colon cancer Neg Hx    Stomach cancer Neg Hx    Esophageal cancer Neg Hx      Social History   Occupational History   Occupation: retired  Tobacco Use   Smoking status: Some Days    Current packs/day: 0.25    Average packs/day: 0.3 packs/day for 50.0 years (12.5 ttl pk-yrs)    Types: Cigarettes    Passive exposure: Never   Smokeless tobacco: Never  Vaping Use   Vaping status: Never Used  Substance and Sexual Activity   Alcohol use: Not Currently    Comment: occ   Drug use: Not Currently   Sexual activity: Yes     Allergies[1]   Outpatient Medications Prior to Visit  Medication Sig Dispense Refill   albuterol  (VENTOLIN  HFA) 108 (90 Base) MCG/ACT inhaler Inhale 2 puffs into the lungs every 6 (six) hours as needed for wheezing or shortness of breath. 18 g 3   atorvastatin  (LIPITOR) 40 MG tablet Take 1 tablet (40 mg total) by mouth daily. 90 tablet 0   B Complex-C-Folic Acid  (B-COMPLEX BALANCED) TABS Take 1 tablet by mouth daily.     Fe Fum-FePoly-FA-Vit C-Vit B3 (IRON  FOLATE-F PO) Take by mouth.     feeding supplement (ENSURE ENLIVE / ENSURE PLUS) LIQD Take 237 mLs by mouth 3 (three) times daily between meals. 237 mL 12   losartan  (COZAAR ) 25 MG tablet Take 1 tablet (25 mg total) by mouth at bedtime. 90 tablet 3   metoprolol  succinate (TOPROL -XL) 50 MG 24 hr tablet Take 1 tablet (50 mg total) by mouth daily. Hold, if systolic (top number) is less than 100, and/or if heart rate is less than 55 90 tablet 3   Misc Natural Products (OSTEO BI-FLEX TRIPLE STRENGTH PO) Take 1 mg by mouth 2 (two) times daily.     Multiple Vitamin (MULTIVITAMIN) tablet Take 1 tablet by mouth daily.     Omega-3 Fatty Acids (  OMEGA-3 FISH OIL) 1200 MG CAPS Take 1 capsule by mouth daily.     pantoprazole  (PROTONIX ) 40 MG tablet Take 1 tablet (40 mg total) by mouth 2 (two) times daily. 60 tablet 0   potassium chloride  SA (KLOR-CON  M) 20 MEQ tablet Take 2 tablets (40 mEq total) by mouth daily at 12 noon for 1 day, THEN 1 tablet (20 mEq total) daily at 12 noon. 31 tablet 6   tetrahydrozoline 0.05 % ophthalmic solution Place 2 drops into both eyes daily.     No facility-administered medications prior to visit.    Review of Systems  Constitutional:  Negative for chills, diaphoresis, fever, malaise/fatigue and weight loss.  HENT:  Negative for congestion.   Respiratory:  Positive for cough and shortness of breath. Negative for hemoptysis, sputum production and wheezing.   Cardiovascular:  Negative for chest pain, palpitations and  leg swelling.     Objective:   Vitals:   07/23/24 1447  BP: (!) 146/103  Pulse: 88  Temp: 98.8 F (37.1 C)  SpO2: 97%  Weight: 208 lb 8 oz (94.6 kg)  Height: 6' 1 (1.854 m)   SpO2: 97 %  Physical Exam    Data Reviewed:  Imaging: CT Chest 06/24/24 - Centrilobular and paraseptal emphysema. LLL 5mm nodule that is stable compared to 01/28/23. cirrhosis  PFT: 07/23/24 FVC 3.21 (70%) FEV1 1.96 (53%) Ratio 56  DLCO 65% Interpretation: Moderate obstructive defect and mildly reduced gas exchange   Labs: CBC    Component Value Date/Time   WBC 5.3 01/16/2024 1516   WBC 5.5 04/23/2023 0544   RBC 3.89 (L) 01/16/2024 1516   RBC 2.62 (L) 04/23/2023 0544   HGB 12.2 (L) 01/16/2024 1516   HCT 37.7 01/16/2024 1516   PLT 269 01/16/2024 1516   MCV 97 01/16/2024 1516   MCH 31.4 01/16/2024 1516   MCH 30.9 04/23/2023 0544   MCHC 32.4 01/16/2024 1516   MCHC 33.6 04/23/2023 0544   RDW 13.6 01/16/2024 1516   LYMPHSABS 1.2 01/16/2024 1516   MONOABS 0.5 04/23/2023 0544   EOSABS 0.3 01/16/2024 1516   BASOSABS 0.1 01/16/2024 1516   Absolute eos 01/16/24 - 300     Assessment & Plan:   Discussion: HPI  Assessment & Plan Centrilobular emphysema (HCC) Reviewed pulmonary function tests. Moderate COPD --START Wixela 100-50 mcg ONE puff TWICE a day. (Goodrx) --START Spiriva  2.5 mcg TWO puffs ONCE a day ($~355) --Referred to Pulmonary Rehab  Health Maintenance Immunization History  Administered Date(s) Administered   PPD Test 04/22/2019   Tdap 11/02/2021   CT Lung Screen - will enroll***  Orders Placed This Encounter  Procedures   AMB referral to pulmonary rehabilitation    Referral Priority:   Routine    Referral Type:   Consultation    Number of Visits Requested:   1   Pulmonary function test    Standing Status:   Future    Number of Occurrences:   1    Expiration Date:   07/23/2025    Where should this test be performed?:   Outpatient Pulmonary    What type of PFT is  being ordered?:   Full PFT   Meds ordered this encounter  Medications   fluticasone -salmeterol (WIXELA INHUB ) 100-50 MCG/ACT AEPB    Sig: Inhale 1 puff into the lungs 2 (two) times daily.    Dispense:  60 each    Refill:  5   Tiotropium Bromide (SPIRIVA  RESPIMAT) 2.5 MCG/ACT AERS  Sig: Inhale 2 puffs into the lungs daily.    Dispense:  4 g    Refill:  5    No follow-ups on file.  I have spent a total time of 45-minutes on the day of the appointment reviewing prior documentation, coordinating care and discussing medical diagnosis and plan with the patient/family. Imaging, labs and tests included in this note have been reviewed and interpreted independently by me. This note is generated using Abridge programming. Patient/family has given consent.  Dantonio Justen Slater Staff, MD [facility] 07/23/2024 4:19 PM        [1] No Known Allergies  "

## 2024-07-23 NOTE — Progress Notes (Signed)
Spiro/DLCO performed today. 

## 2024-07-23 NOTE — Patient Instructions (Signed)
Spiro/DLCO performed today. 

## 2024-07-23 NOTE — Patient Instructions (Addendum)
 Centrilobular emphysema (HCC) Reviewed pulmonary function tests. Moderate COPD --START Wixela 100-50 mcg ONE puff TWICE a day. (Goodrx) --START Spiriva  2.5 mcg TWO puffs ONCE a day ($~355) --CONTINUEalbuterol  AS NEEDED --Referred to Pulmonary Rehab

## 2024-07-25 ENCOUNTER — Telehealth (HOSPITAL_BASED_OUTPATIENT_CLINIC_OR_DEPARTMENT_OTHER): Payer: Self-pay

## 2024-07-25 ENCOUNTER — Other Ambulatory Visit (HOSPITAL_BASED_OUTPATIENT_CLINIC_OR_DEPARTMENT_OTHER): Payer: Self-pay

## 2024-07-25 ENCOUNTER — Ambulatory Visit: Admitting: Gastroenterology

## 2024-07-25 DIAGNOSIS — J432 Centrilobular emphysema: Secondary | ICD-10-CM

## 2024-07-25 NOTE — Telephone Encounter (Signed)
 Order placed   Copied from CRM [id number]. Topic: Referral - Status >> Jul 25, 2024  9:16 AM Corean SAUNDERS wrote: Reason for CRM: Patient states he saw Dr. Kassie last week and that he contacted his insurance company and he will be covered for the pulmonary exercise program that they discussed, and is requesting the provider to please place that referral for him.

## 2024-07-29 ENCOUNTER — Telehealth: Payer: Self-pay | Admitting: Nurse Practitioner

## 2024-07-29 NOTE — Telephone Encounter (Signed)
 Contacted pt confirmed appt

## 2024-07-30 ENCOUNTER — Encounter: Payer: Self-pay | Admitting: Nurse Practitioner

## 2024-07-30 ENCOUNTER — Encounter (HOSPITAL_BASED_OUTPATIENT_CLINIC_OR_DEPARTMENT_OTHER)

## 2024-07-30 ENCOUNTER — Ambulatory Visit: Attending: Nurse Practitioner | Admitting: Nurse Practitioner

## 2024-07-30 ENCOUNTER — Other Ambulatory Visit: Payer: Self-pay

## 2024-07-30 VITALS — BP 171/94 | HR 70 | Ht 73.0 in | Wt 221.0 lb

## 2024-07-30 DIAGNOSIS — I5022 Chronic systolic (congestive) heart failure: Secondary | ICD-10-CM | POA: Diagnosis not present

## 2024-07-30 DIAGNOSIS — D649 Anemia, unspecified: Secondary | ICD-10-CM | POA: Diagnosis not present

## 2024-07-30 DIAGNOSIS — R7303 Prediabetes: Secondary | ICD-10-CM

## 2024-07-30 DIAGNOSIS — I110 Hypertensive heart disease with heart failure: Secondary | ICD-10-CM

## 2024-07-30 DIAGNOSIS — J432 Centrilobular emphysema: Secondary | ICD-10-CM

## 2024-07-30 DIAGNOSIS — E7800 Pure hypercholesterolemia, unspecified: Secondary | ICD-10-CM

## 2024-07-30 DIAGNOSIS — I10 Essential (primary) hypertension: Secondary | ICD-10-CM

## 2024-07-30 MED ORDER — LOSARTAN POTASSIUM 50 MG PO TABS
50.0000 mg | ORAL_TABLET | Freq: Every day | ORAL | 1 refills | Status: AC
  Filled 2024-07-30: qty 90, 90d supply, fill #0

## 2024-07-30 MED ORDER — FUROSEMIDE 20 MG PO TABS
20.0000 mg | ORAL_TABLET | Freq: Every day | ORAL | 0 refills | Status: DC
  Filled 2024-07-30: qty 90, 90d supply, fill #0

## 2024-07-30 MED ORDER — FUROSEMIDE 20 MG PO TABS
20.0000 mg | ORAL_TABLET | Freq: Every day | ORAL | 0 refills | Status: AC
  Filled 2024-07-30: qty 90, 90d supply, fill #0

## 2024-07-30 MED ORDER — FLUTICASONE-SALMETEROL 100-50 MCG/ACT IN AEPB
1.0000 | INHALATION_SPRAY | Freq: Two times a day (BID) | RESPIRATORY_TRACT | 5 refills | Status: AC
  Filled 2024-07-30 (×2): qty 60, 30d supply, fill #0

## 2024-07-30 MED ORDER — ATORVASTATIN CALCIUM 40 MG PO TABS
40.0000 mg | ORAL_TABLET | Freq: Every day | ORAL | 2 refills | Status: AC
  Filled 2024-07-30: qty 90, 90d supply, fill #0

## 2024-07-30 NOTE — Progress Notes (Signed)
 "  Assessment & Plan:  Luis Marquez was seen today for hypertension.  Diagnoses and all orders for this visit:  Primary hypertension Increase losartan  to 50 mg daily.  -     CMP14+EGFR Continue metoprolol  as prescribed.  Reminded to bring in blood pressure log for follow  up appointment.  RECOMMENDATIONS: DASH/Mediterranean Diets are healthier choices for HTN.     Anemia, unspecified type -     CBC with Differential/Platelet  Prediabetes -     Hemoglobin A1c  Centrilobular emphysema (HCC) -     fluticasone -salmeterol (WIXELA INHUB ) 100-50 MCG/ACT AEPB; Inhale 1 puff into the lungs 2 (two) times daily.  Chronic systolic CHF Instructed to contact Cardiology office for f/u -     furosemide  (LASIX ) 20 MG tablet; Take 1 tablet (20 mg total) by mouth daily. For leg swelling and heart failure DASH DIET   Hypercholesterolemia -     atorvastatin  (LIPITOR) 40 MG tablet; Take 1 tablet (40 mg total) by mouth daily. For cholesterol INSTRUCTIONS: Work on a low fat, heart healthy diet and participate in regular aerobic exercise program by working out at least 150 minutes per week; 5 days a week-30 minutes per day. Avoid red meat/beef/steak,  fried foods. junk foods, sodas, sugary drinks, unhealthy snacking, alcohol and smoking.  Drink at least 80 oz of water per day and monitor your carbohydrate intake daily.      Patient has been counseled on age-appropriate routine health concerns for screening and prevention. These are reviewed and up-to-date. Referrals have been placed accordingly. Immunizations are up-to-date or declined.    Subjective:   Chief Complaint  Patient presents with   Hypertension    Luis Marquez 64 y.o. male presents to office today for follow up to HTN   He is awaiting a gastroenterology consultation before seeing an electrophysiologist  He has moderate COPD and emphysema, with a recent CT scan showing no new abnormalities. He is awaiting pulmonary rehabilitation to be  scheduled. I contacted the office for him and was instructed that they would reach out to him within the next few days for scheduling.    HTN Blood pressure is elevated today. He is currently prescribed losartan  25 mg daily, torpol XL 50 mg daily. We will need to increase losartan  today.  BP Readings from Last 3 Encounters:  07/30/24 (!) 171/94  07/23/24 (!) 146/103  01/25/24 (!) 143/93     He experiences occasional shortness of breath, particularly when moving from bed to the bathroom, but not daily. He notes persistent swelling in his legs and feet which is present today (2+). He recalls being on furosemide  (Lasix ) in the past but is unsure why it was discontinued. He does not currently have a scale to monitor his weight changes. Weight is significantly increased today.  Wt Readings from Last 3 Encounters:  07/30/24 221 lb (100.2 kg)  07/23/24 208 lb 8 oz (94.6 kg)  01/25/24 203 lb 14.4 oz (92.5 kg)     He has a history of skin cancer and is scheduled for a dermatology appointment to monitor his condition.   He also mentions having new prescriptions glasses and is adjusting to them.    Review of Systems  Constitutional:  Negative for fever, malaise/fatigue and weight loss.  HENT: Negative.  Negative for nosebleeds.   Eyes: Negative.  Negative for blurred vision, double vision and photophobia.  Respiratory:  Positive for shortness of breath. Negative for cough.   Cardiovascular:  Positive for palpitations and leg  swelling. Negative for chest pain.  Gastrointestinal: Negative.  Negative for heartburn, nausea and vomiting.  Musculoskeletal: Negative.  Negative for myalgias.  Neurological: Negative.  Negative for dizziness, focal weakness, seizures and headaches.  Psychiatric/Behavioral: Negative.  Negative for suicidal ideas.     Past Medical History:  Diagnosis Date   AAA (abdominal aortic aneurysm)    Chronic systolic heart failure (HCC)    Delirium tremens (HCC) 11/11/2014    Diverticulosis    Emphysema, unspecified (HCC)    ETOH abuse    Fracture, intertrochanteric, right femur (HCC) 11/11/2014   GERD (gastroesophageal reflux disease)    Hyperlipidemia    Hypertension    Hypomagnesemia    Laceration of head 11/03/2021   Pancreatitis    Paroxysmal A-fib (HCC)    Syncope 12/07/2021   Tobacco abuse     Past Surgical History:  Procedure Laterality Date   BIOPSY  12/10/2021   Procedure: BIOPSY;  Surgeon: Shila Gustav GAILS, MD;  Location: [facility] ENDOSCOPY;  Service: Gastroenterology;;   BIOPSY  11/24/2022   Procedure: BIOPSY;  Surgeon: Albertus Gordy HERO, MD;  Location: [facility] ENDOSCOPY;  Service: Gastroenterology;;   COLONOSCOPY WITH PROPOFOL  N/A 11/24/2022   Procedure: COLONOSCOPY WITH PROPOFOL ;  Surgeon: Albertus Gordy HERO, MD;  Location: [facility] ENDOSCOPY;  Service: Gastroenterology;  Laterality: N/A;   ESOPHAGOGASTRODUODENOSCOPY (EGD) WITH PROPOFOL  N/A 12/10/2021   Procedure: ESOPHAGOGASTRODUODENOSCOPY (EGD) WITH PROPOFOL ;  Surgeon: Shila Gustav GAILS, MD;  Location: [facility] ENDOSCOPY;  Service: Gastroenterology;  Laterality: N/A;   ESOPHAGOGASTRODUODENOSCOPY (EGD) WITH PROPOFOL  N/A 11/24/2022   Procedure: ESOPHAGOGASTRODUODENOSCOPY (EGD) WITH PROPOFOL ;  Surgeon: Albertus Gordy HERO, MD;  Location: [facility] ENDOSCOPY;  Service: Gastroenterology;  Laterality: N/A;   HEMOSTASIS CLIP PLACEMENT  12/10/2021   Procedure: HEMOSTASIS CLIP PLACEMENT;  Surgeon: Shila Gustav GAILS, MD;  Location: [facility] ENDOSCOPY;  Service: Gastroenterology;;   HERNIA REPAIR     INTRAMEDULLARY (IM) NAIL INTERTROCHANTERIC Right 11/11/2014   Procedure: INTRAMEDULLARY (IM) NAIL INTERTROCHANTRIC;  Surgeon: Donnice Car, MD;  Location: [facility] ORS;  Service: Orthopedics;  Laterality: Right;   POLYPECTOMY  11/24/2022   Procedure: POLYPECTOMY;  Surgeon: Albertus Gordy HERO, MD;  Location: [facility] ENDOSCOPY;  Service: Gastroenterology;;   RIGHT/LEFT HEART CATH AND CORONARY ANGIOGRAPHY N/A 11/11/2021   Procedure: RIGHT/LEFT HEART CATH AND CORONARY  ANGIOGRAPHY;  Surgeon: Cherrie Toribio SAUNDERS, MD;  Location: [facility] INVASIVE CV LAB;  Service: Cardiovascular;  Laterality: N/A;   Rod right leg      Family History  Problem Relation Age of Onset   Diabetes Mother    Colon cancer Neg Hx    Stomach cancer Neg Hx    Esophageal cancer Neg Hx     Social History Reviewed with no changes to be made today.   Outpatient Medications Prior to Visit  Medication Sig Dispense Refill   albuterol  (VENTOLIN  HFA) 108 (90 Base) MCG/ACT inhaler Inhale 2 puffs into the lungs every 6 (six) hours as needed for wheezing or shortness of breath. 18 g 3   B Complex-C-Folic Acid  (B-COMPLEX BALANCED) TABS Take 1 tablet by mouth daily.     Fe Fum-FePoly-FA-Vit C-Vit B3 (IRON  FOLATE-F PO) Take by mouth.     feeding supplement (ENSURE ENLIVE / ENSURE PLUS) LIQD Take 237 mLs by mouth 3 (three) times daily between meals. 237 mL 12   metoprolol  succinate (TOPROL -XL) 50 MG 24 hr tablet Take 1 tablet (50 mg total) by mouth daily. Hold, if systolic (top number) is less than 100, and/or if heart rate is less than 55 90 tablet  3   Misc Natural Products (OSTEO BI-FLEX TRIPLE STRENGTH PO) Take 1 mg by mouth 2 (two) times daily.     Multiple Vitamin (MULTIVITAMIN) tablet Take 1 tablet by mouth daily.     Omega-3 Fatty Acids (OMEGA-3 FISH OIL) 1200 MG CAPS Take 1 capsule by mouth daily.     pantoprazole  (PROTONIX ) 40 MG tablet Take 1 tablet (40 mg total) by mouth 2 (two) times daily. 60 tablet 0   potassium chloride  SA (KLOR-CON  M) 20 MEQ tablet Take 2 tablets (40 mEq total) by mouth daily at 12 noon for 1 day, THEN 1 tablet (20 mEq total) daily at 12 noon. 31 tablet 6   tetrahydrozoline 0.05 % ophthalmic solution Place 2 drops into both eyes daily.     atorvastatin  (LIPITOR) 40 MG tablet Take 1 tablet (40 mg total) by mouth daily. 90 tablet 0   losartan  (COZAAR ) 25 MG tablet Take 1 tablet (25 mg total) by mouth at bedtime. 90 tablet 3   Tiotropium Bromide (SPIRIVA  RESPIMAT) 2.5  MCG/ACT AERS Inhale 2 puffs into the lungs daily. (Patient not taking: Reported on 07/30/2024) 4 g 5   fluticasone -salmeterol (WIXELA INHUB ) 100-50 MCG/ACT AEPB Inhale 1 puff into the lungs 2 (two) times daily. (Patient not taking: Reported on 07/30/2024) 60 each 5   No facility-administered medications prior to visit.    Allergies[1]     Objective:    BP (!) 171/94 (BP Location: Left Arm, Patient Position: Sitting, Cuff Size: Normal)   Pulse 70   Ht 6' 1 (1.854 m)   Wt 221 lb (100.2 kg)   BMI 29.16 kg/m  Wt Readings from Last 3 Encounters:  07/30/24 221 lb (100.2 kg)  07/23/24 208 lb 8 oz (94.6 kg)  01/25/24 203 lb 14.4 oz (92.5 kg)    Physical Exam Vitals and nursing note reviewed.  Constitutional:      Appearance: He is well-developed.  HENT:     Head: Normocephalic and atraumatic.  Cardiovascular:     Rate and Rhythm: Normal rate and regular rhythm.     Heart sounds: Normal heart sounds. No murmur heard.    No friction rub. No gallop.  Pulmonary:     Effort: Pulmonary effort is normal. No tachypnea or respiratory distress.     Breath sounds: Normal breath sounds. No decreased breath sounds, wheezing, rhonchi or rales.  Chest:     Chest wall: No tenderness.  Abdominal:     General: Bowel sounds are normal.     Palpations: Abdomen is soft.  Musculoskeletal:        General: Normal range of motion.     Cervical back: Normal range of motion.     Right lower leg: 2+ Edema present.     Left lower leg: 2+ Edema present.     Right ankle: Swelling present.     Left ankle: Swelling present.     Right foot: Swelling present.     Left foot: Swelling present.  Skin:    General: Skin is warm and dry.  Neurological:     Mental Status: He is alert and oriented to person, place, and time.     Coordination: Coordination normal.  Psychiatric:        Behavior: Behavior normal. Behavior is cooperative.        Thought Content: Thought content normal.        Judgment: Judgment  normal.          Patient has been counseled extensively about  nutrition and exercise as well as the importance of adherence with medications and regular follow-up. The patient was given clear instructions to go to ER or return to medical center if symptoms don't improve, worsen or new problems develop. The patient verbalized understanding.   Follow-up: No follow-ups on file.   Haze LELON Servant, FNP-BC [facility] [place], [place] [phone]   07/30/2024, 3:44 PM     [1] No Known Allergies  "

## 2024-07-31 ENCOUNTER — Encounter (HOSPITAL_COMMUNITY): Payer: Self-pay

## 2024-07-31 ENCOUNTER — Encounter: Payer: Self-pay | Admitting: Dermatology

## 2024-07-31 ENCOUNTER — Telehealth (HOSPITAL_COMMUNITY): Payer: Self-pay

## 2024-07-31 ENCOUNTER — Ambulatory Visit: Admitting: Dermatology

## 2024-07-31 DIAGNOSIS — L821 Other seborrheic keratosis: Secondary | ICD-10-CM

## 2024-07-31 DIAGNOSIS — D485 Neoplasm of uncertain behavior of skin: Secondary | ICD-10-CM | POA: Diagnosis not present

## 2024-07-31 DIAGNOSIS — D044 Carcinoma in situ of skin of scalp and neck: Secondary | ICD-10-CM

## 2024-07-31 DIAGNOSIS — D099 Carcinoma in situ, unspecified: Secondary | ICD-10-CM

## 2024-07-31 DIAGNOSIS — L570 Actinic keratosis: Secondary | ICD-10-CM | POA: Diagnosis not present

## 2024-07-31 HISTORY — DX: Carcinoma in situ, unspecified: D09.9

## 2024-07-31 LAB — CMP14+EGFR
ALT: 9 IU/L (ref 0–44)
AST: 20 IU/L (ref 0–40)
Albumin: 3.3 g/dL — ABNORMAL LOW (ref 3.9–4.9)
Alkaline Phosphatase: 110 IU/L (ref 47–123)
BUN/Creatinine Ratio: 14 (ref 10–24)
BUN: 11 mg/dL (ref 8–27)
Bilirubin Total: 0.5 mg/dL (ref 0.0–1.2)
CO2: 26 mmol/L (ref 20–29)
Calcium: 8.7 mg/dL (ref 8.6–10.2)
Chloride: 101 mmol/L (ref 96–106)
Creatinine, Ser: 0.79 mg/dL (ref 0.76–1.27)
Globulin, Total: 2.9 g/dL (ref 1.5–4.5)
Glucose: 82 mg/dL (ref 70–99)
Potassium: 3.7 mmol/L (ref 3.5–5.2)
Sodium: 141 mmol/L (ref 134–144)
Total Protein: 6.2 g/dL (ref 6.0–8.5)
eGFR: 99 mL/min/1.73

## 2024-07-31 LAB — CBC WITH DIFFERENTIAL/PLATELET
Basophils Absolute: 0.1 x10E3/uL (ref 0.0–0.2)
Basos: 1 %
EOS (ABSOLUTE): 0.2 x10E3/uL (ref 0.0–0.4)
Eos: 3 %
Hematocrit: 35.5 % — ABNORMAL LOW (ref 37.5–51.0)
Hemoglobin: 11.5 g/dL — ABNORMAL LOW (ref 13.0–17.7)
Immature Grans (Abs): 0 x10E3/uL (ref 0.0–0.1)
Immature Granulocytes: 0 %
Lymphocytes Absolute: 1.3 x10E3/uL (ref 0.7–3.1)
Lymphs: 21 %
MCH: 32 pg (ref 26.6–33.0)
MCHC: 32.4 g/dL (ref 31.5–35.7)
MCV: 99 fL — ABNORMAL HIGH (ref 79–97)
Monocytes Absolute: 0.5 x10E3/uL (ref 0.1–0.9)
Monocytes: 9 %
Neutrophils Absolute: 4.1 x10E3/uL (ref 1.4–7.0)
Neutrophils: 65 %
Platelets: 262 x10E3/uL (ref 150–450)
RBC: 3.59 x10E6/uL — ABNORMAL LOW (ref 4.14–5.80)
RDW: 12.8 % (ref 11.6–15.4)
WBC: 6.2 x10E3/uL (ref 3.4–10.8)

## 2024-07-31 LAB — HEMOGLOBIN A1C
Est. average glucose Bld gHb Est-mCnc: 103 mg/dL
Hgb A1c MFr Bld: 5.2 % (ref 4.8–5.6)

## 2024-07-31 NOTE — Progress Notes (Signed)
" ° °  Follow-Up Visit   Subjective  Luis Marquez is a 64 y.o. male who presents for the following: Spot Check  He has had a spiky lesion on his scalp for the past two to three months. The lesion has not changed in size or appearance during this time. He identifies the location of the lesion when asked.  He spent some time in [place] , which delayed his return for medical evaluation.  Patient present today for follow up visit for a spot check. Patient was last evaluated on 02/08/2024 for a spot check.   Patient states that there is a spike on his scalp that he has tried to pick off but will not come off. It has been present for 2-3 months. It has not gotten worse.   The following portions of the chart were reviewed this encounter and updated as appropriate: medications, allergies, medical history  Review of Systems:  No other skin or systemic complaints except as noted in HPI or Assessment and Plan.  Objective  Well appearing patient in no apparent distress; mood and affect are within normal limits.  A focused examination was performed of the following areas: Scalp  Relevant exam findings are noted in the Assessment and Plan. Mid Parietal Scalp 1.5cm irregularly shaped brown macule with adjeacent hyperkeratotic papule  Mid Frontal Scalp, Mid Parietal Scalp (3) Erythematous thin papules/macules with gritty scale.   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  Treatment Plan: 08/29/2024 NEOPLASM OF UNCERTAIN BEHAVIOR OF SKIN Mid Parietal Scalp - Skin / nail biopsy Type of biopsy: tangential   Informed consent: discussed and consent obtained   Timeout: patient name, date of birth, surgical site, and procedure verified   Procedure prep:  Patient was prepped and draped in usual sterile fashion Prep type:  Isopropyl alcohol Anesthesia: the lesion was  anesthetized in a standard fashion   Anesthetic:  1% lidocaine  w/ epinephrine 1-100,000 buffered w/ 8.4% NaHCO3 (1cc) Instrument used: DermaBlade   Hemostasis achieved with: aluminum chloride   Outcome: patient tolerated procedure well   Post-procedure details: sterile dressing applied and wound care instructions given   Dressing type: bandage and petrolatum    Specimen 1 - Surgical pathology Differential Diagnosis: r/o MM vs DN vs other with AK  Check Margins: No AK (ACTINIC KERATOSIS) (4) Mid Frontal Scalp, Mid Parietal Scalp (3) - Destruction of lesion - Mid Frontal Scalp, Mid Parietal Scalp (3) Complexity: simple   Destruction method: cryotherapy   Informed consent: discussed and consent obtained   Timeout:  patient name, date of birth, surgical site, and procedure verified Lesion destroyed using liquid nitrogen: Yes   Region frozen until ice ball extended beyond lesion: Yes   Outcome: patient tolerated procedure well with no complications   Post-procedure details: wound care instructions given     Return for next mohs surgery.  Luis Rollene Gobble, RN, am acting as scribe for RUFUS CHRISTELLA HOLY, MD .   Documentation: I have reviewed the above documentation for accuracy and completeness, and I agree with the above.  RUFUS CHRISTELLA HOLY, MD   "

## 2024-07-31 NOTE — Telephone Encounter (Signed)
 Pt insurance is active and benefits verified through [facility] Medicare Co-pay $15, DED 0/0 met, out of pocket $4,200/$50 met, co-insurance 0%. no pre-authorization required, [employer] 07/31/2024@11 :19, REF# [id number]  No visit limit for pulmonary rehab

## 2024-07-31 NOTE — Patient Instructions (Signed)

## 2024-07-31 NOTE — Telephone Encounter (Signed)
 Called patient to see if he was interested in participating in the Pulmonary Rehab Program. Patient will come in for orientation on 1/19@1030  and will attend the 1:15 exercise class.  Provided Information to pt via telephone.

## 2024-08-01 LAB — SURGICAL PATHOLOGY

## 2024-08-02 ENCOUNTER — Ambulatory Visit: Payer: Self-pay | Admitting: Dermatology

## 2024-08-03 ENCOUNTER — Ambulatory Visit: Payer: Self-pay | Admitting: Nurse Practitioner

## 2024-08-03 DIAGNOSIS — D649 Anemia, unspecified: Secondary | ICD-10-CM

## 2024-08-05 ENCOUNTER — Telehealth (HOSPITAL_COMMUNITY): Payer: Self-pay

## 2024-08-05 ENCOUNTER — Encounter (HOSPITAL_COMMUNITY): Admission: RE | Admit: 2024-08-05 | Source: Ambulatory Visit

## 2024-08-05 NOTE — Telephone Encounter (Signed)
 Pt stated that he would like to hold off until March for Pulmonary Rehab.  He will reach out to us  when he is ready.  Closed referral

## 2024-08-05 NOTE — Progress Notes (Signed)
 Spoke with pt gave him bx results and treatment recommendations

## 2024-08-06 NOTE — Telephone Encounter (Signed)
 Sending as an [employer]CIAL [employer] from [employer].

## 2024-08-06 NOTE — Telephone Encounter (Signed)
 Copied from CRM [id number]. Topic: Clinical - Lab/Test Results >> Aug 05, 2024  5:39 PM    Delon DASEN wrote:  Reason for CRM: read results verbatim advised to pick up stool kit

## 2024-08-08 ENCOUNTER — Encounter: Payer: Self-pay | Admitting: Dermatology

## 2024-08-15 ENCOUNTER — Encounter: Admitting: Dermatology

## 2024-08-19 ENCOUNTER — Ambulatory Visit: Admitting: Gastroenterology

## 2024-08-20 ENCOUNTER — Encounter (HOSPITAL_COMMUNITY)

## 2024-08-22 ENCOUNTER — Encounter (HOSPITAL_COMMUNITY)

## 2024-08-27 ENCOUNTER — Encounter (HOSPITAL_COMMUNITY)

## 2024-08-29 ENCOUNTER — Encounter: Admitting: Dermatology

## 2024-08-29 ENCOUNTER — Encounter (HOSPITAL_COMMUNITY)

## 2024-09-03 ENCOUNTER — Encounter (HOSPITAL_COMMUNITY)

## 2024-09-05 ENCOUNTER — Encounter (HOSPITAL_COMMUNITY)

## 2024-09-10 ENCOUNTER — Ambulatory Visit: Admitting: Gastroenterology

## 2024-09-10 ENCOUNTER — Encounter (HOSPITAL_COMMUNITY)

## 2024-09-12 ENCOUNTER — Encounter (HOSPITAL_COMMUNITY)

## 2024-09-17 ENCOUNTER — Encounter (HOSPITAL_COMMUNITY)

## 2024-09-18 ENCOUNTER — Encounter: Admitting: Dermatology

## 2024-09-19 ENCOUNTER — Encounter (HOSPITAL_COMMUNITY)

## 2024-09-24 ENCOUNTER — Encounter (HOSPITAL_COMMUNITY)

## 2024-09-26 ENCOUNTER — Encounter (HOSPITAL_COMMUNITY)

## 2024-10-01 ENCOUNTER — Encounter (HOSPITAL_COMMUNITY)

## 2024-10-02 ENCOUNTER — Encounter: Admitting: Dermatology

## 2024-10-03 ENCOUNTER — Encounter (HOSPITAL_COMMUNITY)

## 2024-10-08 ENCOUNTER — Encounter (HOSPITAL_COMMUNITY)

## 2024-10-10 ENCOUNTER — Encounter (HOSPITAL_COMMUNITY)

## 2024-10-15 ENCOUNTER — Encounter (HOSPITAL_COMMUNITY)

## 2024-10-17 ENCOUNTER — Encounter (HOSPITAL_COMMUNITY)

## 2024-10-22 ENCOUNTER — Encounter (HOSPITAL_COMMUNITY)

## 2024-10-24 ENCOUNTER — Encounter (HOSPITAL_COMMUNITY)

## 2024-10-29 ENCOUNTER — Ambulatory Visit: Payer: Self-pay | Admitting: Nurse Practitioner

## 2024-11-04 ENCOUNTER — Ambulatory Visit (HOSPITAL_BASED_OUTPATIENT_CLINIC_OR_DEPARTMENT_OTHER): Admitting: Pulmonary Disease
